# Patient Record
Sex: Male | Born: 1935 | Race: Black or African American | Hispanic: No | Marital: Married | Smoking: Current some day smoker
Health system: Southern US, Community
[De-identification: ages and names within clinical notes are randomized; demographics above are authoritative.]

## PROBLEM LIST (undated history)

## (undated) DIAGNOSIS — D179 Benign lipomatous neoplasm, unspecified: Secondary | ICD-10-CM

## (undated) DIAGNOSIS — H9319 Tinnitus, unspecified ear: Secondary | ICD-10-CM

## (undated) DIAGNOSIS — K649 Unspecified hemorrhoids: Secondary | ICD-10-CM

## (undated) DIAGNOSIS — K2270 Barrett's esophagus without dysplasia: Secondary | ICD-10-CM

## (undated) DIAGNOSIS — B360 Pityriasis versicolor: Secondary | ICD-10-CM

## (undated) DIAGNOSIS — N400 Enlarged prostate without lower urinary tract symptoms: Secondary | ICD-10-CM

## (undated) DIAGNOSIS — Z87898 Personal history of other specified conditions: Secondary | ICD-10-CM

## (undated) DIAGNOSIS — M1990 Unspecified osteoarthritis, unspecified site: Secondary | ICD-10-CM

## (undated) DIAGNOSIS — E785 Hyperlipidemia, unspecified: Secondary | ICD-10-CM

## (undated) DIAGNOSIS — K219 Gastro-esophageal reflux disease without esophagitis: Secondary | ICD-10-CM

## (undated) DIAGNOSIS — M109 Gout, unspecified: Secondary | ICD-10-CM

## (undated) DIAGNOSIS — E669 Obesity, unspecified: Secondary | ICD-10-CM

## (undated) DIAGNOSIS — IMO0002 Reserved for concepts with insufficient information to code with codable children: Secondary | ICD-10-CM

## (undated) DIAGNOSIS — K56609 Unspecified intestinal obstruction, unspecified as to partial versus complete obstruction: Secondary | ICD-10-CM

## (undated) DIAGNOSIS — K635 Polyp of colon: Secondary | ICD-10-CM

## (undated) DIAGNOSIS — C159 Malignant neoplasm of esophagus, unspecified: Secondary | ICD-10-CM

## (undated) DIAGNOSIS — N529 Male erectile dysfunction, unspecified: Secondary | ICD-10-CM

## (undated) DIAGNOSIS — I10 Essential (primary) hypertension: Secondary | ICD-10-CM

## (undated) DIAGNOSIS — I440 Atrioventricular block, first degree: Secondary | ICD-10-CM

## (undated) HISTORY — PX: VASECTOMY: SHX75

## (undated) HISTORY — DX: Personal history of other specified conditions: Z87.898

## (undated) HISTORY — DX: Obesity, unspecified: E66.9

## (undated) HISTORY — DX: Unspecified osteoarthritis, unspecified site: M19.90

## (undated) HISTORY — DX: Pityriasis versicolor: B36.0

## (undated) HISTORY — DX: Benign prostatic hyperplasia without lower urinary tract symptoms: N40.0

## (undated) HISTORY — DX: Gout, unspecified: M10.9

## (undated) HISTORY — DX: Reserved for concepts with insufficient information to code with codable children: IMO0002

## (undated) HISTORY — DX: Malignant neoplasm of esophagus, unspecified: C15.9

## (undated) HISTORY — DX: Male erectile dysfunction, unspecified: N52.9

## (undated) HISTORY — DX: Unspecified hemorrhoids: K64.9

## (undated) HISTORY — PX: HEMORRHOID SURGERY: SHX153

## (undated) HISTORY — DX: Hyperlipidemia, unspecified: E78.5

## (undated) HISTORY — PX: GASTRIC RESECTION: SHX5248

## (undated) HISTORY — DX: Benign lipomatous neoplasm, unspecified: D17.9

## (undated) HISTORY — PX: OTHER SURGICAL HISTORY: SHX169

## (undated) HISTORY — DX: Barrett's esophagus without dysplasia: K22.70

## (undated) HISTORY — DX: Polyp of colon: K63.5

## (undated) HISTORY — DX: Atrioventricular block, first degree: I44.0

## (undated) HISTORY — DX: Tinnitus, unspecified ear: H93.19

## (undated) HISTORY — DX: Unspecified intestinal obstruction, unspecified as to partial versus complete obstruction: K56.609

---

## 1997-12-02 ENCOUNTER — Ambulatory Visit (HOSPITAL_COMMUNITY): Admission: RE | Admit: 1997-12-02 | Discharge: 1997-12-02 | Payer: Self-pay | Admitting: *Deleted

## 1998-01-30 ENCOUNTER — Ambulatory Visit (HOSPITAL_COMMUNITY): Admission: RE | Admit: 1998-01-30 | Discharge: 1998-01-30 | Payer: Self-pay | Admitting: *Deleted

## 1998-04-07 ENCOUNTER — Ambulatory Visit (HOSPITAL_COMMUNITY): Admission: RE | Admit: 1998-04-07 | Discharge: 1998-04-07 | Payer: Self-pay | Admitting: *Deleted

## 1998-06-15 ENCOUNTER — Emergency Department (HOSPITAL_COMMUNITY): Admission: EM | Admit: 1998-06-15 | Discharge: 1998-06-15 | Payer: Self-pay | Admitting: Emergency Medicine

## 1999-05-10 DIAGNOSIS — C159 Malignant neoplasm of esophagus, unspecified: Secondary | ICD-10-CM

## 1999-05-10 HISTORY — DX: Malignant neoplasm of esophagus, unspecified: C15.9

## 1999-05-10 HISTORY — PX: ESOPHAGECTOMY: SUR457

## 1999-06-17 ENCOUNTER — Emergency Department (HOSPITAL_COMMUNITY): Admission: EM | Admit: 1999-06-17 | Discharge: 1999-06-17 | Payer: Self-pay | Admitting: Emergency Medicine

## 1999-06-24 ENCOUNTER — Encounter (INDEPENDENT_AMBULATORY_CARE_PROVIDER_SITE_OTHER): Payer: Self-pay | Admitting: Specialist

## 1999-06-24 ENCOUNTER — Ambulatory Visit (HOSPITAL_COMMUNITY): Admission: RE | Admit: 1999-06-24 | Discharge: 1999-06-24 | Payer: Self-pay | Admitting: *Deleted

## 1999-06-24 ENCOUNTER — Encounter (INDEPENDENT_AMBULATORY_CARE_PROVIDER_SITE_OTHER): Payer: Self-pay | Admitting: *Deleted

## 1999-07-18 DIAGNOSIS — Z8501 Personal history of malignant neoplasm of esophagus: Secondary | ICD-10-CM | POA: Insufficient documentation

## 1999-07-20 ENCOUNTER — Ambulatory Visit (HOSPITAL_COMMUNITY): Admission: RE | Admit: 1999-07-20 | Discharge: 1999-07-20 | Payer: Self-pay | Admitting: *Deleted

## 1999-07-20 ENCOUNTER — Encounter (INDEPENDENT_AMBULATORY_CARE_PROVIDER_SITE_OTHER): Payer: Self-pay | Admitting: *Deleted

## 1999-10-21 ENCOUNTER — Encounter (INDEPENDENT_AMBULATORY_CARE_PROVIDER_SITE_OTHER): Payer: Self-pay | Admitting: *Deleted

## 1999-10-21 ENCOUNTER — Ambulatory Visit (HOSPITAL_COMMUNITY): Admission: RE | Admit: 1999-10-21 | Discharge: 1999-10-21 | Payer: Self-pay | Admitting: *Deleted

## 2001-02-24 ENCOUNTER — Encounter: Payer: Self-pay | Admitting: Emergency Medicine

## 2001-02-24 ENCOUNTER — Emergency Department (HOSPITAL_COMMUNITY): Admission: EM | Admit: 2001-02-24 | Discharge: 2001-02-24 | Payer: Self-pay | Admitting: Emergency Medicine

## 2003-01-06 ENCOUNTER — Ambulatory Visit (HOSPITAL_COMMUNITY): Admission: RE | Admit: 2003-01-06 | Discharge: 2003-01-06 | Payer: Self-pay | Admitting: *Deleted

## 2003-01-06 ENCOUNTER — Encounter (INDEPENDENT_AMBULATORY_CARE_PROVIDER_SITE_OTHER): Payer: Self-pay | Admitting: *Deleted

## 2003-01-06 ENCOUNTER — Encounter: Payer: Self-pay | Admitting: *Deleted

## 2003-05-12 ENCOUNTER — Ambulatory Visit (HOSPITAL_COMMUNITY): Admission: RE | Admit: 2003-05-12 | Discharge: 2003-05-12 | Payer: Self-pay | Admitting: *Deleted

## 2003-05-12 ENCOUNTER — Encounter (INDEPENDENT_AMBULATORY_CARE_PROVIDER_SITE_OTHER): Payer: Self-pay | Admitting: *Deleted

## 2003-05-12 ENCOUNTER — Encounter (INDEPENDENT_AMBULATORY_CARE_PROVIDER_SITE_OTHER): Payer: Self-pay | Admitting: Specialist

## 2004-05-30 ENCOUNTER — Emergency Department (HOSPITAL_COMMUNITY): Admission: EM | Admit: 2004-05-30 | Discharge: 2004-05-30 | Payer: Self-pay | Admitting: Emergency Medicine

## 2004-10-22 ENCOUNTER — Ambulatory Visit (HOSPITAL_COMMUNITY): Admission: RE | Admit: 2004-10-22 | Discharge: 2004-10-22 | Payer: Self-pay | Admitting: Internal Medicine

## 2004-12-10 ENCOUNTER — Encounter (INDEPENDENT_AMBULATORY_CARE_PROVIDER_SITE_OTHER): Payer: Self-pay | Admitting: *Deleted

## 2004-12-10 ENCOUNTER — Ambulatory Visit (HOSPITAL_COMMUNITY): Admission: RE | Admit: 2004-12-10 | Discharge: 2004-12-10 | Payer: Self-pay | Admitting: *Deleted

## 2004-12-10 ENCOUNTER — Encounter (INDEPENDENT_AMBULATORY_CARE_PROVIDER_SITE_OTHER): Payer: Self-pay | Admitting: Specialist

## 2005-03-08 ENCOUNTER — Encounter: Admission: RE | Admit: 2005-03-08 | Discharge: 2005-03-08 | Payer: Self-pay | Admitting: Internal Medicine

## 2005-05-02 ENCOUNTER — Emergency Department (HOSPITAL_COMMUNITY): Admission: EM | Admit: 2005-05-02 | Discharge: 2005-05-02 | Payer: Self-pay | Admitting: Emergency Medicine

## 2006-11-16 ENCOUNTER — Emergency Department (HOSPITAL_COMMUNITY): Admission: EM | Admit: 2006-11-16 | Discharge: 2006-11-16 | Payer: Self-pay | Admitting: Emergency Medicine

## 2007-02-09 ENCOUNTER — Emergency Department (HOSPITAL_COMMUNITY): Admission: EM | Admit: 2007-02-09 | Discharge: 2007-02-09 | Payer: Self-pay | Admitting: Emergency Medicine

## 2007-05-24 ENCOUNTER — Emergency Department (HOSPITAL_COMMUNITY): Admission: EM | Admit: 2007-05-24 | Discharge: 2007-05-24 | Payer: Self-pay | Admitting: Family Medicine

## 2007-07-27 ENCOUNTER — Emergency Department (HOSPITAL_COMMUNITY): Admission: EM | Admit: 2007-07-27 | Discharge: 2007-07-28 | Payer: Self-pay | Admitting: Emergency Medicine

## 2007-10-02 ENCOUNTER — Ambulatory Visit (HOSPITAL_COMMUNITY): Admission: RE | Admit: 2007-10-02 | Discharge: 2007-10-02 | Payer: Self-pay | Admitting: *Deleted

## 2007-10-02 ENCOUNTER — Encounter (INDEPENDENT_AMBULATORY_CARE_PROVIDER_SITE_OTHER): Payer: Self-pay | Admitting: *Deleted

## 2008-07-02 ENCOUNTER — Encounter (INDEPENDENT_AMBULATORY_CARE_PROVIDER_SITE_OTHER): Payer: Self-pay | Admitting: *Deleted

## 2008-07-02 ENCOUNTER — Ambulatory Visit (HOSPITAL_COMMUNITY): Admission: RE | Admit: 2008-07-02 | Discharge: 2008-07-02 | Payer: Self-pay | Admitting: *Deleted

## 2009-05-09 HISTORY — PX: TRANSTHORACIC ECHOCARDIOGRAM: SHX275

## 2009-05-09 HISTORY — PX: NM MYOCAR PERF WALL MOTION: HXRAD629

## 2009-06-16 ENCOUNTER — Encounter (INDEPENDENT_AMBULATORY_CARE_PROVIDER_SITE_OTHER): Payer: Self-pay | Admitting: *Deleted

## 2009-07-13 ENCOUNTER — Ambulatory Visit: Payer: Self-pay | Admitting: Internal Medicine

## 2009-07-13 DIAGNOSIS — K2270 Barrett's esophagus without dysplasia: Secondary | ICD-10-CM | POA: Insufficient documentation

## 2009-07-13 DIAGNOSIS — R143 Flatulence: Secondary | ICD-10-CM

## 2009-07-13 DIAGNOSIS — R142 Eructation: Secondary | ICD-10-CM

## 2009-07-13 DIAGNOSIS — K219 Gastro-esophageal reflux disease without esophagitis: Secondary | ICD-10-CM

## 2009-07-13 DIAGNOSIS — R141 Gas pain: Secondary | ICD-10-CM

## 2009-07-13 DIAGNOSIS — R1319 Other dysphagia: Secondary | ICD-10-CM

## 2009-07-17 DIAGNOSIS — Z8501 Personal history of malignant neoplasm of esophagus: Secondary | ICD-10-CM

## 2010-03-08 ENCOUNTER — Encounter: Payer: Self-pay | Admitting: Internal Medicine

## 2010-03-26 ENCOUNTER — Encounter: Payer: Self-pay | Admitting: Internal Medicine

## 2010-05-29 ENCOUNTER — Encounter: Payer: Self-pay | Admitting: Internal Medicine

## 2010-06-10 NOTE — Op Note (Signed)
Summary: EGD w/Biopsy  NAME:  Oscar [name], Oscar [name]               ACCOUNT NO.:  [id number]      MEDICAL RECORD NO.:  [id number]          PATIENT TYPE:  AMB      LOCATION:  ENDO                         FACILITY:  [facility]      PHYSICIAN:  Georgiana Spinner, M.D.    DATE OF BIRTH:  Dec 25, 1932      DATE OF PROCEDURE:  10/02/2007   DATE OF DISCHARGE:                                  OPERATIVE REPORT      PROCEDURE:  Upper endoscopy with biopsy.      ENDOSCOPIST:  Georgiana Spinner, M.D.      INDICATIONS:  Abdominal pain, now somewhat resolved.      ANESTHESIA:  Fentanyl 75 mcg, Versed 7 mg.      PROCEDURE:  With the patient mildly sedated in the left lateral   decubitus position, the Pentax videoscopic endoscope was inserted in the   mouth and passed under direct vision through the esophagus, which   appeared normal, until we reached the distal esophagus and there was   clear-cut Barrett's esophagus, photographed and multiple biopsies taken.   The suture material was seen and we were able to enter into the stomach   and through a hiatal hernia sac and it was noted that the patient had   gastroparesis and that there was food material seen in the stomach.  We   were able to advance past this after insufflation of the stomach and   enter into the small intestine, which appeared normal.  From this point,   the endoscope was slowly withdrawn taking circumferential views of the   duodenal mucosa until the endoscope been pulled back into the stomach   and withdrawn, taking circumferential views of the gastric and   esophageal mucosa.  The patient's vital signs and pulse oximeter   remained stable.  The patient tolerated the procedure well and without   apparent complications.      FINDINGS:  Residual hiatal hernia with Barrett's esophagus and findings   compatible with gastroparesis with patent gastroduodenal area.      PLAN:  Await biopsy report and discuss the patient's clinical history   with him,  consideration for metoclopramide depending on his needs.  The   patient will call me for results and follow up with me as an outpatient.                  ______________________________   Georgiana Spinner, M.D.            GMO/MEDQ  D:  10/02/2007  T:  10/02/2007  Job:  [id number]

## 2010-06-10 NOTE — Op Note (Signed)
Summary: EGD-Dr. Flora Lipps [facility]. [facility]  Patient:    Oscar [name], Oscar [name]                      MRN: [id number] Proc. Date: 06/24/99 Adm. Date:  [id number] Attending:  Sharyn Dross CC:         Soyla Murphy. Renne Crigler, M.D.                           Procedure Report  PREOPERATIVE DIAGNOSIS:  Melanotic stools.  POSTOPERATIVE DIAGNOSIS:  Short Barretts esophagus, otherwise normal endoscopic  examination.  PROCEDURE PERFORMED:  Esophagogastroduodenoscopy with biopsies.  MEDICATIONS USED:  Demerol 60 mg IV, Versed 6 mg IV over a 10 to 15-minute period of time.  INSTRUMENT USED:  Olympus video panendoscope.  ENDOSCOPIST:  Dortha Kern, M.D.  INDICATIONS:  This pleasant 77 year old black gentleman known to me in the past  with a history of acid peptic disease that was noted.  It has been relatively stable up until recently.  The patient states that he has been feeling slightly  weak and dizzy at that time.  He started passing melanotic stools that was present. The patient presented to the emergency room whereupon evaluation, his hemoglobin appeared to be within normal limits at that time.  The patient subsequently came to the office and was evaluated for the discomforts.  There was no history of any syncopal episodes associated with this or any history of diaphoresis that was present.  The patient was not taking any medications such as iron or Pepto-Bismol that was noted.  OBJECTIVE FINDINGS:  He is a pleasant gentleman who appears to be in no distress. His vital signs are stable.  The HEENT examination was anicteric.  Neck was supple. Lungs were clear.  Heart had a regular rate and rhythm without heaves, thrills,  murmurs or gallop.  The abdomen was soft.  There was mild tenderness to palpation in the superumbilical region with no rebound or referred tenderness appreciated. No hepatosplenomegaly noted.  Digital rectal exam was deferred at that  time. The extremities showed no cyanosis, clubbing or edema.  PLAN:  To proceed with the endoscopic examination based on the character of the  melena that was present and the symptoms, depending on the results to determine the course of therapy.  INFORMED CONSENT:  The patient was advised of the procedure, the indications and the risks involved.  The patient has agreed to have the procedure performed.  A  video was reviewed and the consent form was obtained.  PREOPERATIVE PREPARATION:  The patient was brought to the endoscopy unit where n IV for IV sedating medication was started.  A monitor was placed on the patient to monitor the patients vital signs and oxygen saturation.  Nasal oxygen at two liters a minute was used and after adequate sedation was performed, the procedure was begun.  DESCRIPTION OF PROCEDURE:  The instrument was advanced with the patient lying in the left lateral position via direct technique without difficulty.  The oropharyngeal epliglottis, vocal cords and piriform sinuses appeared to be grossly within normal limits.  The esophagus was normal without any evidence of acute inflammation, ulcerations, hiatal hernias or varices appreciated.  The Z-line appeared to be approximately 40 cm that was noted.  The gastric area showed a normal mucous lake without  any evidence of acute inflammation or ulcerations that was appreciated at this time.  The pylorus was  normal with good peristaltic activity.  Upon advancement through the pyloric canal, the duodenal bulb and second portion appeared to be grossly within normal limits. The instrument was retracted back where a retroflex view of the cardia showed evidence of relaxed lower esophageal sphincter that was appreciated. Photographs were taken of the relaxation that was noted at this time.  It appeared to be moderate in character at this time.  The instrument was retracted back where there appeared to be  evidence of a short  Barretts that was appreciated that was noted. Photographs were taken of the region as the instrument was retracted back at this time.  Biopsies of the Barretts area was noted at this time.  There was no other abnormalities appreciated.  No evidence of any postendoscopic trauma to the mucosal tissue that was appreciated.  The instrument was subsequently removed per orum without difficulty with the patient tolerating the procedure well.  TREATMENT: 1. I am going to recommend treating the patient with a proton pump inhibitor    medication at this time. 2. Would recommend repeating the procedure in approximately six months to one    years period of time.  The patient may need to have periodic following with    this Barretts process to prevent complication noted.  The data has been shown in the past that with short Barretts, treatment with a PI may be beneficial for the patient at this time to possibly delay or prevent neoplastic process that could develop in this patient.  This is not the case with large Barretts, long Barretts that was appreciated but it can be beneficial with the patient at this time.  Thus, I would recommend longterm PPIs for the patient during the interim.  Will await the results of the path report depending on the results to determine the course of therapy. DD:  06/24/99 TD:  06/24/99 Job: [id number]

## 2010-06-10 NOTE — Op Note (Signed)
Summary: Colonoscopy-Dr. Virginia Rochester  NAME:  Oscar [name], Oscar [name]                         ACCOUNT NO.:  [id number]   MEDICAL RECORD NO.:  [id number]                   PATIENT TYPE:  AMB   LOCATION:  ENDO                                 FACILITY:  [facility]   PHYSICIAN:  Georgiana Spinner, M.D.                 DATE OF BIRTH:  1932-08-04   DATE OF PROCEDURE:  DATE OF DISCHARGE:                                 OPERATIVE REPORT   PROCEDURE:  Colonoscopy.   INDICATIONS:  Colon cancer screening.   ANESTHESIA:  Demerol 40 mg, Versed 2.   DESCRIPTION OF PROCEDURE:  With the patient mildly sedated and in the left  lateral decubitus position, the Olympus videoscopic colonoscope was inserted  in the rectum and passed under direct vision to the cecum, identified by  ileocecal valve and appendiceal orifice.  The prep was suboptimal.  The  patient had indicated that the prep might be suboptimal because he was not  sure he really wanted to proceed with the exam, but eventually did consent.  From this point, the colonoscope was slowly withdrawn, taking  circumferential views of the colonic mucosa, stopping only in the rectum  which appeared normal on direct and showed hemorrhoids on retroflexed view.  The endoscope was straightened and withdrawn.  The patient's vital signs and  pulse oximetry remained stable.  The patient tolerated the procedure well  without apparent complications.   FINDINGS:  Internal hemorrhoids; otherwise unremarkable colonoscopic  examination.   PLAN:  Consider repeat examination possibly in 5-10 years.                                               Georgiana Spinner, M.D.    GMO/MEDQ  D:  05/12/2003  T:  05/12/2003  Job:  [id number]

## 2010-06-10 NOTE — Op Note (Signed)
Summary: Colonoscopy-Dr. Flora Lipps H. [facility]  Patient:    Oscar [name], Oscar [name]                      MRN: [id number] Proc. Date: 07/20/99 Adm. Date:  [id number] Attending:  Sharyn Dross CC:         Soyla Murphy. Renne Crigler, M.D.                           Procedure Report  REFERRING PHYSICIAN:  Soyla Murphy. Renne Crigler, M.D.  PREPROCEDURE DIAGNOSIS:  History of colon polyps.  POSTPROCEDURE DIAGNOSIS:  Scattered diverticula, otherwise normal colonoscopic examination to the cecum.  PROCEDURE:  Colonoscopy.  MEDICATIONS:  Demerol 80 mg IV, Versed 8 mg IV over a 10 minute period of time.   INSTRUMENT:  Olympus video colonoscopy.  ENDOSCOPIST:  Sharyn Dross., M.D.  INDICATIONS:  This pleasant 77 year old black gentleman, who is known to me with a history of colon polyps in the past is brought back in for reevaluation of the area.  The patient denies any symptoms of any major GI discomforts from the lower standpoint.  There is no evidence of any bleeding nor any evidence of any weight loss and discomfort noted.  OBJECTIVE FINDINGS:  He is a pleasant gentleman and appears to be in no acute distress.  His vital signs are stable.  His HEENT examination is  anicteric. Neck was supple.  Lungs are clear.  Heart had a regular rate and rhythm without heaves, thrills, murmurs, or gallops.  The abdomen is soft.  No tenderness.  No hepatosplenomegaly.  The extremities are within normal limits.  PLAN:  I am going to proceed with the colonoscopic examination.  INFORMED CONSENT:  The patient was advised of the procedure, and the indications of the risks involved.  The patient has agreed to have the procedure performed. Video was reviewed and consent form obtained.  PREOPERATIVE PREPARATION:  The patient was brought to the endoscopy unit with an IV for IV.  Sedating medication was started.  A monitor was placed on the patient o monitor the patients vital signs and  oxygen saturation.  Nasal oxygen at 2 L/min. was used and after adequate sedation was performed the procedure was begun.  FINAL PREP:  The patient was given GoLYTELY and Reglan as a bowel prep.  The patient tolerated the prep well without any complications.  The GoLYTELY prep was excellent.  DESCRIPTION OF PROCEDURE:  The instrument was advanced with the patient lying in the left lateral position via the direct technique to approximately 100 cm approximately in the colon to the cecal region.  This was confirmed by palpation and visualization of the ileocecal valve and the appendiceal orifice.  There appeared to be no gross abnormalities such as masses, polyps, or stricture lesion appreciated.  The vascular pattern appeared to be well within normal limits throughout the entire colon.  The mucosal pattern showed scattered diverticula hat was present, but no evidence of any inflammatory changes noted at this time. There was no increased tortuosity of the colon and no evidence of any internal nor external hemorrhoids upon exiting from the area.  The patient tolerated the procedure well without any complications.  TREATMENT: 1. Conservative management. 2. Would recommend repeating procedure in approximately two years.  If    negative then  would proceed with a standard routine of every three    years from that point. DD:  07/20/99 TD:  07/20/99 Job: [id number]

## 2010-06-10 NOTE — Assessment & Plan Note (Signed)
Summary: GERD symptoms..Barrett's (newpt)   History of Present Illness Visit Type: Initial Consult Primary GI MD: Yancey Flemings MD Primary Provider: Merri Brunette, MD Requesting Provider: Merri Brunette, MD Chief Complaint: GERD / Esophageal cancer. Dr. Renne Crigler referred him to follow up for his Barretts. History of Present Illness:   77 y.o. AA male w/ h/o hypertension,hyperlipidemia, osteoarthritis, diabetes mellitus (diet controlled), and reported esophageal cancer - requiring two surgies in 2000 and 2001 at [facility] (per patient - NO DETAILS).He is sent today regarding GERD, a history of Barrett's, and the need for upper endoscopy. He has a long GI history and I have reviewed over 30 pages of reports from various sources including Dr. Virginia Rochester (his most recent GI doc), Dr Tad Moore (former GI doc), and Dr Renne Crigler. Also, reviewed multiple EGD,Colonoscopy, Pathology, and CT reports. In sum, EGD w/ Barrett's and HGD in 2001. Multiple other EGDs stating Barrett's but not confirmed by bx. Last EGD2-24-10 w/ rare focus of Barrett's. Last colonoscopy 07-02-08 normal. Current complaints include reflux symptoms (pyrosis) when not taking PPI. Certain food make it worse. Takes omeprazole daily which helps. Some mild solid food dysphagia  for several years and bloating more chronically. Occ alt bowel habits related to what he eats. No bleeding or weight loss. His multiple chronic medical problems are stable.   GI Review of Systems    Reports acid reflux, bloating, dysphagia with solids, and  heartburn.      Denies abdominal pain, belching, chest pain, dysphagia with liquids, loss of appetite, nausea, vomiting, vomiting blood, weight loss, and  weight gain.      Reports constipation, diarrhea, and  hemorrhoids.     Denies anal fissure, black tarry stools, change in bowel habit, diverticulosis, fecal incontinence, heme positive stool, irritable bowel syndrome, jaundice, light color stool, liver problems, rectal  bleeding, and  rectal pain. Preventive Screening-Counseling & Management  Alcohol-Tobacco     Smoking Status: current      Drug Use:  no.      Current Medications (verified): 1)  Omeprazole 20 Mg Cpdr (Omeprazole) .... Take Two By Mouth At Bedtime 2)  Losartan Potassium-Hctz 50-12.5 Mg Tabs (Losartan Potassium-Hctz) .... Take One By Mouth Once Daily 3)  Saw Palmetto 1000 Mg Caps (Saw Palmetto (Serenoa Repens)) .... Take Two Tabs By Mouth Once Daily 4)  B Complex-B12  Tabs (B Complex Vitamins) .... Take One By Mouth Once Daily 5)  Ginkoba 40 Mg Tabs (Ginkgo Biloba) .... Take Two Tabs By Mouth Once Daily 6)  Multivitamins  Tabs (Multiple Vitamin) .... Take One By Mouth Once Daily  Allergies (verified): No Known Drug Allergies  Past History:  Past Medical History: Barretts Esophagus Hemorrhoids Hypertension Hx. of Esophageal Cancer Gout Hyperlipidemia Pneumonia HX Colon polyps Diabetes (diet controlled)  Past Surgical History: Hemorrhoidectomy Esophgeal surgery 2001 & 2002 Vasectomy Bilateral olecranon bursal excision  Family History: Family History of Diabetes: mother Family History of Kidney Disease: son  Social History: Occupation: retired married two sons, two daughters Patient currently smokes. pipe 2-3 times per day Alcohol Use - yes 2 per day Daily Caffeine Use one per day Illicit Drug Use - no Smoking Status:  current Drug Use:  no  Review of Systems       The patient complains of headaches-new.  The patient denies allergy/sinus, anemia, anxiety-new, arthritis/joint pain, back pain, blood in urine, breast changes/lumps, change in vision, confusion, cough, coughing up blood, depression-new, fainting, fatigue, fever, hearing problems, heart murmur, heart rhythm changes, itching, menstrual  pain, muscle pains/cramps, night sweats, nosebleeds, pregnancy symptoms, shortness of breath, skin rash, sleeping problems, sore throat, swelling of feet/legs, swollen lymph  glands, thirst - excessive , urination - excessive , urination changes/pain, urine leakage, vision changes, and voice change.    Vital Signs:  Patient profile:   77 year old male Height:      74.5 inches Weight:      223.6 pounds BMI:     28.43 Pulse rate:   72 / minute Pulse rhythm:   regular BP sitting:   136 / 70  (right arm) Cuff size:   regular  Vitals Entered By: Harlow Mares CMA Duncan Dull) (July 13, 2009 10:19 AM)  Physical Exam  General:  Well developed, well nourished, no acute distress. Head:  Normocephalic and atraumatic. Eyes:  PERRLA, no icterus. Nose:  No deformity, discharge,  or lesions. Mouth:  No deformity or lesions,  Neck:  Supple; no masses or thyromegaly. Lungs:  Clear throughout to auscultation. Heart:  Regular rate and rhythm; no murmurs, rubs,  or bruits. Abdomen:  Soft, nontender and nondistended. No masses, hepatosplenomegaly or hernias noted. Normal bowel sounds.Prior surgical incisions well healed. Msk:  Symmetrical with no gross deformities. Normal posture. Pulses:  Normal pulses noted. Extremities:  No clubbing, cyanosis, edema or deformities noted. Neurologic:  Alert and  oriented x4;  grossly normal neurologically. Skin:  Intact without significant lesions or rashes. Psych:  Alert and cooperative. Normal mood and affect.   Impression & Recommendations:  Problem # 1:  GERD (ICD-530.81) complicated by Barrett's, esophageal cancer. Needs PPI to controll reflux symptoms.  PLAN 1. Continue Omeprazole daily (he gets this through the [facility] hospital) 2. Reflux precautions  Problem # 2:  BARRETT'S ESOPHAGUS (ICD-530.85) Undergoes regular surveillance endoscopy, generally annually. Currently due.  PLAN 1. Upper endoscopy. The nature of the procedure, risks,benefits, and alternatives reviewed. He understands and agrees to proceed.Does not want to set up now. He will call the office in the near future to arrange the exam.  Problem # 3:  DYSPHAGIA  (ICD-787.29) some intermittent dysphagia. Rule out stricture.  PLAN 1. Evaluate at time of endoscopy  Problem # 4:  PERSONAL HISTORY MALIGNANT NEOPLASM ESOPHAGUS (ICD-V10.03) Need details. He has agreed to obtain records from [facility] for my review.  Problem # 5:  FLATULENCE-GAS-BLOATING (ICD-787.3) Chronic bloating. Likely due to prior surgery and alterred anatomy / motility. Nothing new to offer.  Patient Instructions: 1)  Patient will call to schedule EGD with Dr. Marina Goodell. 2)  Please get records from [place] for Dr. Marina Goodell to review. 3)  The medication list was reviewed and reconciled.  All changed / newly prescribed medications were explained.  A complete medication list was provided to the patient / caregiver. 4)  Instructions printed and given to patient. Milford Cage [facility]  July 13, 2009 10:49 AM 5)  Copy : Dr. Merri Brunette

## 2010-06-10 NOTE — Op Note (Signed)
Summary: EGD-Dr. Virginia Rochester  NAME:  Oscar [name], Oscar [name]                         ACCOUNT NO.:  [id number]   MEDICAL RECORD NO.:  [id number]                   PATIENT TYPE:  AMB   LOCATION:  ENDO                                 FACILITY:  [facility]   PHYSICIAN:  Georgiana Spinner, M.D.                 DATE OF BIRTH:  [birth year]   DATE OF PROCEDURE:  DATE OF DISCHARGE:                                 OPERATIVE REPORT   PROCEDURE:  Upper endoscopy.   INDICATIONS:  Status post esophageal resection for cancer with anastomosis.   ANESTHESIA:  Demerol 60 mg, Versed 6 mg.   DESCRIPTION OF PROCEDURE:  With the patient mildly sedated in the left  lateral decubitus position, the Olympus videoscopic endoscope was inserted  in the mouth and passed under direct vision through the esophagus, which  appeared normal except where attached to the stomach and it did appear to be  somewhat inflamed in this area.  Therefore, this was photographed and  biopsied.  We entered into the stomach.  The  fundus, body, antrum, duodenal  bulb, and second portion of the duodenum appeared normal.  From this point,  the endoscope was slowly withdrawn taking circumferential views of the  duodenal mucosa until the endoscope then pulled back into the stomach,  placed in retroflexion and viewed the stomach from below and a large hiatal  hernia was seen and photographed.  There was evidence of ______________ with  the GE junction around the endoscope.  The endoscope was then straightened,  withdrawn, taking circumferential views of the remaining gastric and  esophageal mucosa.  The patient's vital signs and pulse oximetry remained  stable.  The patient tolerated the procedure well without apparent  complication.   FINDINGS:  Changes of mild, possibly esophagitis at the anastomotic site,  photographed and biopsied.  Await biopsy report.  The patient will call me  for results and follow up with me as an outpatient.  Proceed to  colonoscopy.                                               Georgiana Spinner, M.D.    GMO/MEDQ  D:  05/12/2003  T:  05/12/2003  Job:  [id number]

## 2010-06-10 NOTE — Op Note (Signed)
Summary: EGD-Dr. Virginia Rochester  NAMEAABAN, GRIEP               ACCOUNT NO.:  [id number]      MEDICAL RECORD NO.:  [id number]          PATIENT TYPE:  AMB      LOCATION:  ENDO                         FACILITY:  [facility]      PHYSICIAN:  Georgiana Spinner, M.D.    DATE OF BIRTH:  1933/01/16      DATE OF PROCEDURE:   DATE OF DISCHARGE:                                  OPERATIVE REPORT      PROCEDURE:  Upper endoscopy.      INDICATIONS:  Abdominal discomfort, weight loss.      ANESTHESIA:  Fentanyl 75 mcg, Versed 7.5 mg.      PROCEDURE:  With the patient mildly sedated in the left lateral   decubitus position, the Pentax videoscopic endoscope was inserted in the   mouth and passed under direct vision through the esophagus which   appeared normal until we reached the distal esophagus and there were   changes of Barrett's photographed and subsequently biopsied.  We entered   into the stomach which appeared to be somewhat edematous, but fundus,   body, antrum, duodenal bulb and second portion of the duodenum were   visualized.  Of note, it was difficult to get the endoscope through the   edematous pyloric area, but we were able to do that eventually.  Once   accomplished, the endoscope was withdrawn taking circumferential views   of the duodenal mucosa until the endoscope had been pulled back in the   stomach and placed in retroflexion to view the stomach from below.  The   endoscope was then straightened and withdrawn taking circumferential   views of remaining gastric and esophageal mucosa.  The patient's vital   signs and pulse oximeter remained stable.  The patient tolerated the   procedure well without apparent complications.      FINDINGS:  Barrett's esophagus, edematous stomach.      PLAN:  Proceed to colonoscopy.                  ______________________________   Georgiana Spinner, M.D.            GMO/MEDQ  D:  07/02/2008  T:  07/02/2008  Job:  [id number]

## 2010-06-10 NOTE — Op Note (Signed)
Summary: EGD-Dr. Virginia Rochester  Oscar [name], SCHONBERG               ACCOUNT NO.:  [id number]   MEDICAL RECORD NO.:  [id number]          PATIENT TYPE:  AMB   LOCATION:  ENDO                         FACILITY:  [facility]   PHYSICIAN:  Georgiana Spinner, M.D.    DATE OF BIRTH:  08/14/1932   DATE OF PROCEDURE:  12/10/2004  DATE OF DISCHARGE:                                 OPERATIVE REPORT   PROCEDURE:  Upper endoscopy.   INDICATIONS:  Abdominal pain.   ANESTHESIA:  Demerol 50, Versed 7 mg.   PROCEDURE:  With the patient mildly sedated in the left lateral decubitus  position, the Olympus videoscopic endoscope was inserted in the mouth,  passed under direct vision through the esophagus which appeared normal to me  at first blush.  There was no evidence of Barrett's.  We entered into the  stomach.  Fundus, body, antrum, duodenal bulb was then entered as was second  portion of duodenum.  From this point, the endoscope was slowly withdrawn  taking circumferential views of duodenal mucosa until the endoscope had been  pulled back into the stomach and placed in retroflexion to view the stomach  from below.  The endoscope was straightened and withdrawn taking  circumferential views of the remaining gastric and esophageal mucosa,  stopping in the distal esophagus where there appeared to be some geographic  distribution of mucosa which I biopsied.  The endoscope was then withdrawn.  The patient's vital signs, pulse oximeter remained stable.  The patient  tolerated the procedure well without apparent complications.   FINDINGS:  Raised mucosa in a geographic pattern in the esophagus of unknown  etiology, biopsied.  Await biopsy report.  The patient will call me for  results and follow-up with me as an outpatient.       GMO/MEDQ  D:  12/10/2004  T:  12/10/2004  Job:  [id number]

## 2010-06-10 NOTE — Letter (Signed)
Summary: New Patient letter  [facility]  [address] [place], [place] [address]   Phone: [phone]  Fax: [phone]       06/16/2009 MRN: [id number]  Oscar [name] [address] [place], [place]  [address]  Dear Mr. Wysocki,  Welcome to the Gastroenterology Division at [facility].    You are scheduled to see Dr.  Marina Goodell on 07/13/2009 at 10:00AM on the 3rd floor at [facility], [address]  We ask that you try to arrive at our office 15 minutes prior to your appointment time to allow for check-in.  We would like you to complete the enclosed self-administered evaluation form prior to your visit and bring it with you on the day of your appointment.  We will review it with you.  Also, please bring a complete list of all your medications or, if you prefer, bring the medication bottles and we will list them.  Please bring your insurance card so that we may make a copy of it.  If your insurance requires a referral to see a specialist, please bring your referral form from your primary care physician.  Co-payments are due at the time of your visit and may be paid by cash, check or credit card.     Your office visit will consist of a consult with your physician (includes a physical exam), any laboratory testing he/she may order, scheduling of any necessary diagnostic testing (e.g. x-ray, ultrasound, CT-scan), and scheduling of a procedure (e.g. Endoscopy, Colonoscopy) if required.  Please allow enough time on your schedule to allow for any/all of these possibilities.    If you cannot keep your appointment, please call [phone] to cancel or reschedule prior to your appointment date.  This allows [place] the opportunity to schedule an appointment for another patient in need of care.  If you do not cancel or reschedule by 5 p.m. the business day prior to your appointment date, you will be charged a $50.00 late cancellation/no-show fee.    Thank you for  choosing [facility] for your medical needs.  We appreciate the opportunity to care for you.  Please visit [place] at our website  to learn more about our practice.                     Sincerely,                                                             The Gastroenterology Division

## 2010-06-10 NOTE — Letter (Signed)
Summary: [facility]   Imported By: Lester [place] 04/22/2010 08:46:13  _____________________________________________________________________  External Attachment:    Type:   Image     Comment:   External Document

## 2010-06-10 NOTE — Op Note (Signed)
Summary: Colonoscopy-Dr. Virginia Rochester  NAME:  Oscar [name], Oscar [name]               ACCOUNT NO.:  [id number]      MEDICAL RECORD NO.:  [id number]          PATIENT TYPE:  AMB      LOCATION:  ENDO                         FACILITY:  [facility]      PHYSICIAN:  Georgiana Spinner, M.D.    DATE OF BIRTH:  Jul 08, 1932      DATE OF PROCEDURE:   DATE OF DISCHARGE:                                  OPERATIVE REPORT      PROCEDURE:  Colonoscopy.      INDICATIONS:  Colon polyps.      ANESTHESIA:  Fentanyl 25 mcg, Versed 2.5 mg.      PROCEDURE:  With the patient mildly sedated in the left lateral   decubitus position, a rectal exam was performed which was unremarkable.   Subsequently, the Pentax videoscopic pediatric colonoscope was inserted   in the rectum and passed under direct vision to the cecum, identified by   the ileocecal valve and appendiceal orifice.  Prep was slightly   suboptimal in that there was thickish material in the cecum that had to   be washed and suctioned.  From this point, the colonoscope was slowly   withdrawn taking circumferential views of colonic mucosa.  Of note, the   entire colon appeared somewhat edematous and would never fully   insufflate until we reached the rectum which appeared normal on direct   and showed hemorrhoids on retroflexed view.  The endoscope was   straightened and withdrawn.  The patient's vital signs and pulse   oximeter remained stable.  The patient tolerated the procedure well   without apparent complication.      FINDINGS:  Edematous colon, but no abnormalities noted in the colon per   say, except internal hemorrhoids.      PLAN:  We will order a CT scan to evaluate the abdomen further and have   the patient follow-up with me for the results of the gastric biopsies   and of the CT scan.                  ______________________________   Georgiana Spinner, M.D.            GMO/MEDQ  D:  07/02/2008  T:  07/02/2008  Job:  [id number]

## 2010-06-10 NOTE — Op Note (Signed)
Summary: EGD-[facility]  Patient:    Oscar [name], Oscar [name]                      MRN: [id number] Proc. Date: 10/21/99 Adm. Date:  [id number] Disc. Date: [id number] Attending:  Sharyn Dross                           Procedure Report  PREOPERATIVE DIAGNOSIS:  History of Barretts esophagus with high-grade dysplasia, brought back in for reevaluation.  POSTOPERATIVE DIAGNOSIS:  Barretts esophagus noted.  PROCEDURE:  Esophagogastroduodenoscopy with biopsies.  MEDICATIONS:  Demerol 50 mg IV, Versed 5 mg IV, over a 10-minute period of time.  INSTRUMENT:  The Olympus video panendoscope.  ENDOSCOPIST:  Sharyn Dross., M.D.  INDICATION:  This pleasant 77 year old gentleman was brought back in three months after his previous endoscopic examination, which showed evidence of Barretts esophagus with a high-grade dysplasia at this time.  The biopsy showed no evidence of malignancy, but since there was high-grade dysplasia and angriness noted, the patient was being brought back in on a quarterly basis for re-endoscopies and biopsies.  He is doing relatively well without any major complaints.  OBJECTIVE FINDINGS:  GENERAL:  This is a pleasant gentleman in no distress.  VITAL SIGNS:  His vital signs were stable.  HEENT:  Anicteric.  NECK:  Supple.  LUNGS:  Clear.  HEART:  Regular rate and rhythm without _____, murmurs, or gallops.  ABDOMEN:  Soft, no tenderness, no hepatosplenomegaly appreciated.  EXTREMITIES:  Within normal limits.  PLAN:  I am going to proceed with the endoscopic examination.  INFORMED CONSENT:  The patient was advised of the procedure, indications and risks involved.  He was explained of the reasons for the repeat serial biopsies that will be done on a quarterly basis based on the results of previous biopsy.  The patient has agreed to come back for a reevaluation.  PREOPERATIVE PREPARATION:  The patient was brought  into the endoscopist with the IV, and IV sedating medications were started.  Monitors were placed on the patient to monitor the patients vital signs and oxygen saturation.  Nasal oxygen at 2 L/min. was used and after adequate sedation was performed, the procedure was begun.  PROCEDURE NOTE:  The instrument was advanced with the patient lying in the left lateral position via the _____ technique without difficulty.  Initially the patient would manipulate the instrument to push it away from going in the right direction.  However, with increased medication the instrument was able to advance into the area without difficulty.  The oropharyngeal, epiglottis, and vocal cords appeared to be unremarkable.  The esophagus shows no evidence of acute inflammation or ulcerations in the proximal portion of the esophagus. However, there appeared to be evidence of hiatal hernia with evidence of the Barretts esophagus appreciated at this time.  The gastric area showed a normal mucus lake without any evidence of acute inflammation or ulcerations that was noted.  The pylorus area appeared to be within normal limits but upon advancing through the pyloric canal, the duodenal bulb showed evidence of duodenitis that was noted in the bulb and postbulbar region.  This appeared to be circumferentially and in the bulb region at this time.  It appeared to be mild to slightly moderate in character at  this time.  There was no ulceration noted.  The ampulla of Vater was visualized without any abnormalities.  The instrument was retracted back at this time.  Retroflexed view of the cardia again showed evidence of the hiatal hernia in the gastric region at this time.  The instrument was retracted back into the esophageal area, where what appears to be the Z-line was approximately 48 cm, but the Barretts protruded up to 38 cm at this time. Photographs were taken of this region at this time.  Multiple biopsies were taken  (some with difficulty).  There was no other gross abnormality noted.  The instrument was gradually retracted back and removed without difficulty, and the patient tolerated the procedure well.  TREATMENT: 1. Will await the results of the biopsy reports at this time. 2. Will have the patient follow up with me in the office. 3. Presently will recommend repeating the procedure in approximately three to    four months. 4. The patient was initially to have a surgical consultation for this region    at this time and depending upon these results will determine the course of    therapy. DD:  10/21/99 TD:  10/25/99 Job: [id number]

## 2010-07-30 ENCOUNTER — Emergency Department (HOSPITAL_COMMUNITY)
Admission: EM | Admit: 2010-07-30 | Discharge: 2010-07-30 | Disposition: A | Discharge status: Home / Self Care | Payer: Medicare Other | Attending: Emergency Medicine | Admitting: Emergency Medicine

## 2010-07-30 ENCOUNTER — Emergency Department (HOSPITAL_COMMUNITY): Payer: Medicare Other

## 2010-07-30 DIAGNOSIS — Z9889 Other specified postprocedural states: Secondary | ICD-10-CM | POA: Insufficient documentation

## 2010-07-30 DIAGNOSIS — R10812 Left upper quadrant abdominal tenderness: Secondary | ICD-10-CM | POA: Insufficient documentation

## 2010-07-30 DIAGNOSIS — Z8501 Personal history of malignant neoplasm of esophagus: Secondary | ICD-10-CM | POA: Insufficient documentation

## 2010-07-30 DIAGNOSIS — I10 Essential (primary) hypertension: Secondary | ICD-10-CM | POA: Insufficient documentation

## 2010-07-30 DIAGNOSIS — M47814 Spondylosis without myelopathy or radiculopathy, thoracic region: Secondary | ICD-10-CM | POA: Insufficient documentation

## 2010-07-30 DIAGNOSIS — R109 Unspecified abdominal pain: Secondary | ICD-10-CM | POA: Insufficient documentation

## 2010-07-30 LAB — DIFFERENTIAL
Basophils Absolute: 0 10*3/uL (ref 0.0–0.1)
Basophils Relative: 0 % (ref 0–1)
Lymphocytes Relative: 26 % (ref 12–46)
Neutro Abs: 5.3 10*3/uL (ref 1.7–7.7)
Neutrophils Relative %: 64 % (ref 43–77)

## 2010-07-30 LAB — POCT I-STAT, CHEM 8
BUN: 11 mg/dL (ref 6–23)
Calcium, Ion: 1.14 mmol/L (ref 1.12–1.32)
Chloride: 100 meq/L (ref 96–112)
Creatinine, Ser: 1.1 mg/dL (ref 0.4–1.5)
Glucose, Bld: 133 mg/dL — ABNORMAL HIGH (ref 70–99)
HCT: 46 % (ref 39.0–52.0)
Hemoglobin: 15.6 g/dL (ref 13.0–17.0)
Potassium: 4 mEq/L (ref 3.5–5.1)
Sodium: 138 mEq/L (ref 135–145)
TCO2: 25 mmol/L (ref 0–100)

## 2010-07-30 LAB — URINALYSIS, ROUTINE W REFLEX MICROSCOPIC
Glucose, UA: NEGATIVE mg/dL
Protein, ur: NEGATIVE mg/dL
Specific Gravity, Urine: 1.015 (ref 1.005–1.030)
pH: 7 (ref 5.0–8.0)

## 2010-07-30 LAB — CBC
Hemoglobin: 14.8 g/dL (ref 13.0–17.0)
RBC: 4.56 MIL/uL (ref 4.22–5.81)

## 2010-07-30 MED ORDER — IOHEXOL 300 MG/ML  SOLN
100.0000 mL | Freq: Once | INTRAMUSCULAR | Status: AC | PRN
  Administered 2010-07-30: 100 mL via INTRAVENOUS

## 2010-08-24 LAB — CREATININE, SERUM
Creatinine, Ser: 1.01 mg/dL (ref 0.4–1.5)
GFR calc Af Amer: >60 mL/min (ref >60)
GFR calc non Af Amer: >60 mL/min (ref >60)

## 2010-08-24 LAB — BUN: BUN: 8 mg/dL (ref 6–23)

## 2010-09-21 NOTE — Op Note (Signed)
[name], Oscar               ACCOUNT NO.:  [id number]   MEDICAL RECORD NO.:  [id number]          PATIENT TYPE:  AMB   LOCATION:  ENDO                         FACILITY:  [facility]   PHYSICIAN:  Georgiana Spinner, M.D.    DATE OF BIRTH:  16-Nov-1932   DATE OF PROCEDURE:  DATE OF DISCHARGE:                               OPERATIVE REPORT   PROCEDURE:  Colonoscopy.   INDICATIONS:  Colon polyps.   ANESTHESIA:  Fentanyl 25 mcg, Versed 2.5 mg.   PROCEDURE:  With the patient mildly sedated in the left lateral  decubitus position, a rectal exam was performed which was unremarkable.  Subsequently, the Pentax videoscopic pediatric colonoscope was inserted  in the rectum and passed under direct vision to the cecum, identified by  the ileocecal valve and appendiceal orifice.  Prep was slightly  suboptimal in that there was thickish material in the cecum that had to  be washed and suctioned.  From this point, the colonoscope was slowly  withdrawn taking circumferential views of colonic mucosa.  Of note, the  entire colon appeared somewhat edematous and would never fully  insufflate until we reached the rectum which appeared normal on direct  and showed hemorrhoids on retroflexed view.  The endoscope was  straightened and withdrawn.  The patient's vital signs and pulse  oximeter remained stable.  The patient tolerated the procedure well  without apparent complication.   FINDINGS:  Edematous colon, but no abnormalities noted in the colon per  say, except internal hemorrhoids.   PLAN:  We will order a CT scan to evaluate the abdomen further and have  the patient follow-up with me for the results of the gastric biopsies  and of the CT scan.           ______________________________  Georgiana Spinner, M.D.     GMO/MEDQ  D:  07/02/2008  T:  07/02/2008  Job:  [id number]

## 2010-09-21 NOTE — Op Note (Signed)
[name], Oscar [name]               ACCOUNT NO.:  [id number]   MEDICAL RECORD NO.:  [id number]          PATIENT TYPE:  AMB   LOCATION:  ENDO                         FACILITY:  [facility]   PHYSICIAN:  Georgiana Spinner, M.D.    DATE OF BIRTH:  1932/06/17   DATE OF PROCEDURE:  DATE OF DISCHARGE:                               OPERATIVE REPORT   PROCEDURE:  Upper endoscopy.   INDICATIONS:  Abdominal discomfort, weight loss.   ANESTHESIA:  Fentanyl 75 mcg, Versed 7.5 mg.   PROCEDURE:  With the patient mildly sedated in the left lateral  decubitus position, the Pentax videoscopic endoscope was inserted in the  mouth and passed under direct vision through the esophagus which  appeared normal until we reached the distal esophagus and there were  changes of Barrett's photographed and subsequently biopsied.  We entered  into the stomach which appeared to be somewhat edematous, but fundus,  body, antrum, duodenal bulb and second portion of the duodenum were  visualized.  Of note, it was difficult to get the endoscope through the  edematous pyloric area, but we were able to do that eventually.  Once  accomplished, the endoscope was withdrawn taking circumferential views  of the duodenal mucosa until the endoscope had been pulled back in the  stomach and placed in retroflexion to view the stomach from below.  The  endoscope was then straightened and withdrawn taking circumferential  views of remaining gastric and esophageal mucosa.  The patient's vital  signs and pulse oximeter remained stable.  The patient tolerated the  procedure well without apparent complications.   FINDINGS:  Barrett's esophagus, edematous stomach.   PLAN:  Proceed to colonoscopy.           ______________________________  Georgiana Spinner, M.D.     GMO/MEDQ  D:  07/02/2008  T:  07/02/2008  Job:  [id number]

## 2010-09-21 NOTE — Op Note (Signed)
[name], Oscar [name]               ACCOUNT NO.:  [id number]   MEDICAL RECORD NO.:  [id number]          PATIENT TYPE:  AMB   LOCATION:  ENDO                         FACILITY:  [facility]   PHYSICIAN:  Georgiana Spinner, M.D.    DATE OF BIRTH:  1932-11-18   DATE OF PROCEDURE:  10/02/2007  DATE OF DISCHARGE:                               OPERATIVE REPORT   PROCEDURE:  Upper endoscopy with biopsy.   ENDOSCOPIST:  Georgiana Spinner, M.D.   INDICATIONS:  Abdominal pain, now somewhat resolved.   ANESTHESIA:  Fentanyl 75 mcg, Versed 7 mg.   PROCEDURE:  With the patient mildly sedated in the left lateral  decubitus position, the Pentax videoscopic endoscope was inserted in the  mouth and passed under direct vision through the esophagus, which  appeared normal, until we reached the distal esophagus and there was  clear-cut Barrett's esophagus, photographed and multiple biopsies taken.  The suture material was seen and we were able to enter into the stomach  and through a hiatal hernia sac and it was noted that the patient had  gastroparesis and that there was food material seen in the stomach.  We  were able to advance past this after insufflation of the stomach and  enter into the small intestine, which appeared normal.  From this point,  the endoscope was slowly withdrawn taking circumferential views of the  duodenal mucosa until the endoscope been pulled back into the stomach  and withdrawn, taking circumferential views of the gastric and  esophageal mucosa.  The patient's vital signs and pulse oximeter  remained stable.  The patient tolerated the procedure well and without  apparent complications.   FINDINGS:  Residual hiatal hernia with Barrett's esophagus and findings  compatible with gastroparesis with patent gastroduodenal area.   PLAN:  Await biopsy report and discuss the patient's clinical history  with him, consideration for metoclopramide depending on his needs.  The  patient will call  me for results and follow up with me as an outpatient.           ______________________________  Georgiana Spinner, M.D.     GMO/MEDQ  D:  10/02/2007  T:  10/02/2007  Job:  [id number]

## 2010-09-24 NOTE — Op Note (Signed)
Oscar [name], Oscar [name]               ACCOUNT NO.:  [id number]   MEDICAL RECORD NO.:  [id number]          PATIENT TYPE:  AMB   LOCATION:  ENDO                         FACILITY:  [facility]   PHYSICIAN:  Georgiana Spinner, M.D.    DATE OF BIRTH:  27-Nov-1932   DATE OF PROCEDURE:  12/10/2004  DATE OF DISCHARGE:                                 OPERATIVE REPORT   PROCEDURE:  Upper endoscopy.   INDICATIONS:  Abdominal pain.   ANESTHESIA:  Demerol 50, Versed 7 mg.   PROCEDURE:  With the patient mildly sedated in the left lateral decubitus  position, the Olympus videoscopic endoscope was inserted in the mouth,  passed under direct vision through the esophagus which appeared normal to me  at first blush.  There was no evidence of Barrett's.  We entered into the  stomach.  Fundus, body, antrum, duodenal bulb was then entered as was second  portion of duodenum.  From this point, the endoscope was slowly withdrawn  taking circumferential views of duodenal mucosa until the endoscope had been  pulled back into the stomach and placed in retroflexion to view the stomach  from below.  The endoscope was straightened and withdrawn taking  circumferential views of the remaining gastric and esophageal mucosa,  stopping in the distal esophagus where there appeared to be some geographic  distribution of mucosa which I biopsied.  The endoscope was then withdrawn.  The patient's vital signs, pulse oximeter remained stable.  The patient  tolerated the procedure well without apparent complications.   FINDINGS:  Raised mucosa in a geographic pattern in the esophagus of unknown  etiology, biopsied.  Await biopsy report.  The patient will call me for  results and follow-up with me as an outpatient.       GMO/MEDQ  D:  12/10/2004  T:  12/10/2004  Job:  [id number]

## 2010-09-24 NOTE — Procedures (Signed)
[place]  Patient:    Oscar [name], Oscar [name]                      MRN: [id number] Proc. Date: 06/24/99 Adm. Date:  [id number] Attending:  Sharyn Dross CC:         Soyla Murphy. Renne Crigler, M.D.                           Procedure Report  PREOPERATIVE DIAGNOSIS:  Melanotic stools.  POSTOPERATIVE DIAGNOSIS:  Short Barretts esophagus, otherwise normal endoscopic  examination.  PROCEDURE PERFORMED:  Esophagogastroduodenoscopy with biopsies.  MEDICATIONS USED:  Demerol 60 mg IV, Versed 6 mg IV over a 10 to 15-minute period of time.  INSTRUMENT USED:  Olympus video panendoscope.  ENDOSCOPIST:  Dortha Kern, M.D.  INDICATIONS:  This pleasant 77 year old black gentleman known to me in the past  with a history of acid peptic disease that was noted.  It has been relatively stable up until recently.  The patient states that he has been feeling slightly  weak and dizzy at that time.  He started passing melanotic stools that was present. The patient presented to the emergency room whereupon evaluation, his hemoglobin appeared to be within normal limits at that time.  The patient subsequently came to the office and was evaluated for the discomforts.  There was no history of any syncopal episodes associated with this or any history of diaphoresis that was present.  The patient was not taking any medications such as iron or Pepto-Bismol that was noted.  OBJECTIVE FINDINGS:  He is a pleasant gentleman who appears to be in no distress. His vital signs are stable.  The HEENT examination was anicteric.  Neck was supple. Lungs were clear.  Heart had a regular rate and rhythm without heaves, thrills,  murmurs or gallop.  The abdomen was soft.  There was mild tenderness to palpation in the superumbilical region with no rebound or referred tenderness appreciated. No hepatosplenomegaly noted.  Digital rectal exam was deferred at that time. The extremities  showed no cyanosis, clubbing or edema.  PLAN:  To proceed with the endoscopic examination based on the character of the  melena that was present and the symptoms, depending on the results to determine the course of therapy.  INFORMED CONSENT:  The patient was advised of the procedure, the indications and the risks involved.  The patient has agreed to have the procedure performed.  A  video was reviewed and the consent form was obtained.  PREOPERATIVE PREPARATION:  The patient was brought to the endoscopy unit where n IV for IV sedating medication was started.  A monitor was placed on the patient to monitor the patients vital signs and oxygen saturation.  Nasal oxygen at two liters a minute was used and after adequate sedation was performed, the procedure was begun.  DESCRIPTION OF PROCEDURE:  The instrument was advanced with the patient lying in the left lateral position via direct technique without difficulty.  The oropharyngeal epliglottis, vocal cords and piriform sinuses appeared to be grossly within normal limits.  The esophagus was normal without any evidence of acute inflammation, ulcerations, hiatal hernias or varices appreciated.  The Z-line appeared to be approximately 40 cm that was noted.  The gastric area showed a normal mucous lake without any evidence of acute inflammation or ulcerations that was appreciated at this time.  The pylorus was  normal with good peristaltic  activity.  Upon advancement through the pyloric canal, the duodenal bulb and second portion appeared to be grossly within normal limits. The instrument was retracted back where a retroflex view of the cardia showed evidence of relaxed lower esophageal sphincter that was appreciated. Photographs were taken of the relaxation that was noted at this time.  It appeared to be moderate in character at this time.  The instrument was retracted back where there appeared to be evidence of a short   Barretts that was appreciated that was noted. Photographs were taken of the region as the instrument was retracted back at this time.  Biopsies of the Barretts area was noted at this time.  There was no other abnormalities appreciated.  No evidence of any postendoscopic trauma to the mucosal tissue that was appreciated.  The instrument was subsequently removed per orum without difficulty with the patient tolerating the procedure well.  TREATMENT: 1. I am going to recommend treating the patient with a proton pump inhibitor    medication at this time. 2. Would recommend repeating the procedure in approximately six months to one    years period of time.  The patient may need to have periodic following with    this Barretts process to prevent complication noted.  The data has been shown in the past that with short Barretts, treatment with a PI may be beneficial for the patient at this time to possibly delay or prevent neoplastic process that could develop in this patient.  This is not the case with large Barretts, long Barretts that was appreciated but it can be beneficial with the patient at this time.  Thus, I would recommend longterm PPIs for the patient during the interim.  Will await the results of the path report depending on the results to determine the course of therapy. DD:  06/24/99 TD:  06/24/99 Job: [id number]

## 2010-09-24 NOTE — Procedures (Signed)
[place]. [facility]  Patient:    Oscar [name], Oscar [name]                      MRN: [id number] Proc. Date: 10/21/99 Adm. Date:  [id number] Disc. Date: [id number] Attending:  Sharyn Dross                           Procedure Report  PREOPERATIVE DIAGNOSIS:  History of Barretts esophagus with high-grade dysplasia, brought back in for reevaluation.  POSTOPERATIVE DIAGNOSIS:  Barretts esophagus noted.  PROCEDURE:  Esophagogastroduodenoscopy with biopsies.  MEDICATIONS:  Demerol 50 mg IV, Versed 5 mg IV, over a 10-minute period of time.  INSTRUMENT:  The Olympus video panendoscope.  ENDOSCOPIST:  Sharyn Dross., M.D.  INDICATION:  This pleasant 77 year old gentleman was brought back in three months after his previous endoscopic examination, which showed evidence of Barretts esophagus with a high-grade dysplasia at this time.  The biopsy showed no evidence of malignancy, but since there was high-grade dysplasia and angriness noted, the patient was being brought back in on a quarterly basis for re-endoscopies and biopsies.  He is doing relatively well without any major complaints.  OBJECTIVE FINDINGS:  GENERAL:  This is a pleasant gentleman in no distress.  VITAL SIGNS:  His vital signs were stable.  HEENT:  Anicteric.  NECK:  Supple.  LUNGS:  Clear.  HEART:  Regular rate and rhythm without _____, murmurs, or gallops.  ABDOMEN:  Soft, no tenderness, no hepatosplenomegaly appreciated.  EXTREMITIES:  Within normal limits.  PLAN:  I am going to proceed with the endoscopic examination.  INFORMED CONSENT:  The patient was advised of the procedure, indications and risks involved.  He was explained of the reasons for the repeat serial biopsies that will be done on a quarterly basis based on the results of previous biopsy.  The patient has agreed to come back for a reevaluation.  PREOPERATIVE PREPARATION:  The patient was brought into the endoscopist  with the IV, and IV sedating medications were started.  Monitors were placed on the patient to monitor the patients vital signs and oxygen saturation.  Nasal oxygen at 2 L/min. was used and after adequate sedation was performed, the procedure was begun.  PROCEDURE NOTE:  The instrument was advanced with the patient lying in the left lateral position via the _____ technique without difficulty.  Initially the patient would manipulate the instrument to push it away from going in the right direction.  However, with increased medication the instrument was able to advance into the area without difficulty.  The oropharyngeal, epiglottis, and vocal cords appeared to be unremarkable.  The esophagus shows no evidence of acute inflammation or ulcerations in the proximal portion of the esophagus. However, there appeared to be evidence of hiatal hernia with evidence of the Barretts esophagus appreciated at this time.  The gastric area showed a normal mucus lake without any evidence of acute inflammation or ulcerations that was noted.  The pylorus area appeared to be within normal limits but upon advancing through the pyloric canal, the duodenal bulb showed evidence of duodenitis that was noted in the bulb and postbulbar region.  This appeared to be circumferentially and in the bulb region at this time.  It appeared to be mild to slightly moderate in character at this time.  There was no ulceration noted.  The ampulla of Vater was visualized without any abnormalities.  The instrument was  retracted back at this time.  Retroflexed view of the cardia again showed evidence of the hiatal hernia in the gastric region at this time.  The instrument was retracted back into the esophageal area, where what appears to be the Z-line was approximately 48 cm, but the Barretts protruded up to 38 cm at this time. Photographs were taken of this region at this time.  Multiple biopsies were taken (some with difficulty).   There was no other gross abnormality noted.  The instrument was gradually retracted back and removed without difficulty, and the patient tolerated the procedure well.  TREATMENT: 1. Will await the results of the biopsy reports at this time. 2. Will have the patient follow up with me in the office. 3. Presently will recommend repeating the procedure in approximately three to    four months. 4. The patient was initially to have a surgical consultation for this region    at this time and depending upon these results will determine the course of    therapy. DD:  10/21/99 TD:  10/25/99 Job: [id number]

## 2010-09-24 NOTE — Procedures (Signed)
[place]. [facility]  Patient:    Oscar [name], Oscar [name]                      MRN: [id number] Proc. Date: 07/20/99 Adm. Date:  [id number] Attending:  Sharyn Dross CC:         Soyla Murphy. Renne Crigler, M.D.                           Procedure Report  REFERRING PHYSICIAN:  Soyla Murphy. Renne Crigler, M.D.  PREPROCEDURE DIAGNOSIS:  History of colon polyps.  POSTPROCEDURE DIAGNOSIS:  Scattered diverticula, otherwise normal colonoscopic examination to the cecum.  PROCEDURE:  Colonoscopy.  MEDICATIONS:  Demerol 80 mg IV, Versed 8 mg IV over a 10 minute period of time.   INSTRUMENT:  Olympus video colonoscopy.  ENDOSCOPIST:  Sharyn Dross., M.D.  INDICATIONS:  This pleasant 77 year old black gentleman, who is known to me with a history of colon polyps in the past is brought back in for reevaluation of the area.  The patient denies any symptoms of any major GI discomforts from the lower standpoint.  There is no evidence of any bleeding nor any evidence of any weight loss and discomfort noted.  OBJECTIVE FINDINGS:  He is a pleasant gentleman and appears to be in no acute distress.  His vital signs are stable.  His HEENT examination is  anicteric. Neck was supple.  Lungs are clear.  Heart had a regular rate and rhythm without heaves, thrills, murmurs, or gallops.  The abdomen is soft.  No tenderness.  No hepatosplenomegaly.  The extremities are within normal limits.  PLAN:  I am going to proceed with the colonoscopic examination.  INFORMED CONSENT:  The patient was advised of the procedure, and the indications of the risks involved.  The patient has agreed to have the procedure performed. Video was reviewed and consent form obtained.  PREOPERATIVE PREPARATION:  The patient was brought to the endoscopy unit with an IV for IV.  Sedating medication was started.  A monitor was placed on the patient o monitor the patients vital signs and oxygen saturation.  Nasal oxygen at  2 L/min. was used and after adequate sedation was performed the procedure was begun.  FINAL PREP:  The patient was given GoLYTELY and Reglan as a bowel prep.  The patient tolerated the prep well without any complications.  The GoLYTELY prep was excellent.  DESCRIPTION OF PROCEDURE:  The instrument was advanced with the patient lying in the left lateral position via the direct technique to approximately 100 cm approximately in the colon to the cecal region.  This was confirmed by palpation and visualization of the ileocecal valve and the appendiceal orifice.  There appeared to be no gross abnormalities such as masses, polyps, or stricture lesion appreciated.  The vascular pattern appeared to be well within normal limits throughout the entire colon.  The mucosal pattern showed scattered diverticula hat was present, but no evidence of any inflammatory changes noted at this time. There was no increased tortuosity of the colon and no evidence of any internal nor external hemorrhoids upon exiting from the area.  The patient tolerated the procedure well without any complications.  TREATMENT: 1. Conservative management. 2. Would recommend repeating procedure in approximately two years.  If    negative then would proceed with a standard routine of every three    years from that point. DD:  07/20/99 TD:  07/20/99  Job: [id number]

## 2010-09-24 NOTE — Op Note (Signed)
NAME:  ARON, INGE                         ACCOUNT NO.:  [id number]   MEDICAL RECORD NO.:  [id number]                   PATIENT TYPE:  AMB   LOCATION:  ENDO                                 FACILITY:  [facility]   PHYSICIAN:  Georgiana Spinner, M.D.                 DATE OF BIRTH:  03/25/1933   DATE OF PROCEDURE:  DATE OF DISCHARGE:                                 OPERATIVE REPORT   PROCEDURE:  Upper endoscopy.   INDICATIONS:  Status post esophageal resection for cancer with anastomosis.   ANESTHESIA:  Demerol 60 mg, Versed 6 mg.   DESCRIPTION OF PROCEDURE:  With the patient mildly sedated in the left  lateral decubitus position, the Olympus videoscopic endoscope was inserted  in the mouth and passed under direct vision through the esophagus, which  appeared normal except where attached to the stomach and it did appear to be  somewhat inflamed in this area.  Therefore, this was photographed and  biopsied.  We entered into the stomach.  The  fundus, body, antrum, duodenal  bulb, and second portion of the duodenum appeared normal.  From this point,  the endoscope was slowly withdrawn taking circumferential views of the  duodenal mucosa until the endoscope then pulled back into the stomach,  placed in retroflexion and viewed the stomach from below and a large hiatal  hernia was seen and photographed.  There was evidence of ______________ with  the GE junction around the endoscope.  The endoscope was then straightened,  withdrawn, taking circumferential views of the remaining gastric and  esophageal mucosa.  The patient's vital signs and pulse oximetry remained  stable.  The patient tolerated the procedure well without apparent  complication.   FINDINGS:  Changes of mild, possibly esophagitis at the anastomotic site,  photographed and biopsied.  Await biopsy report.  The patient will call me  for results and follow up with me as an outpatient.  Proceed to colonoscopy.                              Georgiana Spinner, M.D.    GMO/MEDQ  D:  05/12/2003  T:  05/12/2003  Job:  [id number]

## 2010-09-24 NOTE — Op Note (Signed)
NAME:  Oscar [name], Oscar [name]                         ACCOUNT NO.:  [id number]   MEDICAL RECORD NO.:  [id number]                   PATIENT TYPE:  AMB   LOCATION:  ENDO                                 FACILITY[facility]   PHYSICIAN:  Georgiana Spinner, M.D.                 DATE OF BIRTH:  06-Jul-1932   DATE OF PROCEDURE:  DATE OF DISCHARGE:                                 OPERATIVE REPORT   PROCEDURE:  Colonoscopy.   INDICATIONS:  Colon cancer screening.   ANESTHESIA:  Demerol 40 mg, Versed 2.   DESCRIPTION OF PROCEDURE:  With the patient mildly sedated and in the left  lateral decubitus position, the Olympus videoscopic colonoscope was inserted  in the rectum and passed under direct vision to the cecum, identified by  ileocecal valve and appendiceal orifice.  The prep was suboptimal.  The  patient had indicated that the prep might be suboptimal because he was not  sure he really wanted to proceed with the exam, but eventually did consent.  From this point, the colonoscope was slowly withdrawn, taking  circumferential views of the colonic mucosa, stopping only in the rectum  which appeared normal on direct and showed hemorrhoids on retroflexed view.  The endoscope was straightened and withdrawn.  The patient's vital signs and  pulse oximetry remained stable.  The patient tolerated the procedure well  without apparent complications.   FINDINGS:  Internal hemorrhoids; otherwise unremarkable colonoscopic  examination.   PLAN:  Consider repeat examination possibly in 5-10 years.                                               Georgiana Spinner, M.D.    GMO/MEDQ  D:  05/12/2003  T:  05/12/2003  Job:  [id number]

## 2011-01-28 LAB — TROPONIN I: Troponin I: <0.01

## 2011-02-17 LAB — CBC
HCT: 43.7
MCV: 94.7
Platelets: 360
WBC: 10.4

## 2011-02-17 LAB — DIFFERENTIAL
Eosinophils Absolute: 0.2
Eosinophils Relative: 2
Lymphs Abs: 2.8
Monocytes Relative: 5

## 2011-02-17 LAB — BASIC METABOLIC PANEL
BUN: 10
Chloride: 99
Potassium: 3.9

## 2011-02-17 LAB — D-DIMER, QUANTITATIVE: D-Dimer, Quant: 0.4

## 2011-02-17 LAB — POCT CARDIAC MARKERS: Myoglobin, poc: 86.3

## 2011-10-23 ENCOUNTER — Encounter (HOSPITAL_COMMUNITY): Payer: Self-pay | Admitting: *Deleted

## 2011-10-23 ENCOUNTER — Emergency Department (HOSPITAL_COMMUNITY)
Admission: EM | Admit: 2011-10-23 | Discharge: 2011-10-23 | Disposition: A | Discharge status: Home / Self Care | Payer: Medicare Other | Source: Home / Self Care | Attending: Emergency Medicine | Admitting: Emergency Medicine

## 2011-10-23 DIAGNOSIS — B009 Herpesviral infection, unspecified: Secondary | ICD-10-CM

## 2011-10-23 DIAGNOSIS — B001 Herpesviral vesicular dermatitis: Secondary | ICD-10-CM

## 2011-10-23 DIAGNOSIS — L259 Unspecified contact dermatitis, unspecified cause: Secondary | ICD-10-CM

## 2011-10-23 DIAGNOSIS — L309 Dermatitis, unspecified: Secondary | ICD-10-CM

## 2011-10-23 HISTORY — DX: Gastro-esophageal reflux disease without esophagitis: K21.9

## 2011-10-23 HISTORY — DX: Essential (primary) hypertension: I10

## 2011-10-23 MED ORDER — ACYCLOVIR 400 MG PO TABS: 400.0000 mg | ORAL_TABLET | Freq: Three times a day (TID) | ORAL | Status: AC

## 2011-10-23 MED ORDER — FLUCONAZOLE 150 MG PO TABS: ORAL_TABLET | ORAL | Status: AC

## 2011-10-23 MED ORDER — HYDROCORTISONE ACE-PRAMOXINE 2.5-1 % EX CREA: 1.0000 "application " | TOPICAL_CREAM | Freq: Three times a day (TID) | CUTANEOUS | Status: DC

## 2011-10-23 MED ORDER — ACYCLOVIR-HYDROCORTISONE 5-1 % EX CREA: 1.0000 "application " | TOPICAL_CREAM | Freq: Every day | CUTANEOUS | Status: DC

## 2011-10-23 NOTE — ED Notes (Signed)
Pt returned prescription 300+ dollars - dr Chaney Malling notified and new prescriptions printed and given to pt

## 2011-10-23 NOTE — ED Provider Notes (Signed)
History     CSN: [id number]  Arrival date & time 10/23/11  1346   First MD Initiated Contact with Patient 10/23/11 1402      Chief Complaint  Patient presents with  . Rash    (Consider location/radiation/quality/duration/timing/severity/associated sxs/prior treatment) HPI Comments: Patient reports having an itchyrash on his scalp, which then spread to his lower face starting a month ago. He was seen in another urgent care, and given 2 shampoos, and the rash in his scalp has resolved. He states that he also started using Domeboro on the rash on his face, which "cleared most of it up". He states that he has since been keeping the rash on his face covered in Vaseline. He also reports a burning, tingling sensation of his lips, followed by painful blisters on his lower lip. There are no intraoral blisters, sore throat. No nausea, vomiting, fevers. He denies rash anywhere else. He states that he is in a significant amount of time out in the sun, without any sunscreen. He denies any history of herpes. He has sexually active with same partner, and admits to having oral sex with her several weeks ago, but states that she is asymptomatic. No aggravating or alleviating factors. He has not tried anything for the rash on his lips.  ROS as noted in HPI. All other ROS negative.   Patient is a 78 y.o. male presenting with rash. The history is provided by the patient. No language interpreter was used.  Rash  This is a new problem. The current episode started more than 1 week ago. The problem has been gradually worsening. The problem is associated with an unknown factor. There has been no fever. The rash is present on the lips and face. The pain has been constant since onset. Associated symptoms include blisters, itching and pain. He has tried steriods for the symptoms. The treatment provided no relief.    Past Medical History  Diagnosis Date  . Hypertension   . Acid reflux   . High cholesterol   .  Cancer     espohageal cancer tx'd surgery  . Diabetes mellitus     controlled with diet and exercise    Past Surgical History  Procedure Date  . Gastric resection     History reviewed. No pertinent family history.  History  Substance Use Topics  . Smoking status: Current Some Day Smoker    Types: Pipe  . Smokeless tobacco: Not on file  . Alcohol Use: Yes      Review of Systems  Skin: Positive for itching and rash.    Allergies  Review of patient's allergies indicates no known allergies.  Home Medications   Current Outpatient Rx  Name Route Sig Dispense Refill  . ATENOLOL 25 MG PO TABS Oral Take 25 mg by mouth daily.    [name] LOSARTAN POTASSIUM 25 MG PO TABS Oral Take 25 mg by mouth daily.    [name] OMEPRAZOLE 20 MG PO CPDR Oral Take 20 mg by mouth daily.    . ACYCLOVIR-HYDROCORTISONE 5-1 % EX CREA Apply externally Apply 1 application topically 5 (five) times daily. X 5 days. Start at symptom onset 1 Tube 1  . FLUCONAZOLE 150 MG PO TABS  1 tab po x 1. May repeat in 72 hours if no improvement 2 tablet 0    BP 127/73  Pulse 63  Temp 98 F (36.7 C) (Oral)  Resp 18  SpO2 100%  Physical Exam  Nursing note and vitals reviewed. Constitutional: He  is oriented to person, place, and time. He appears well-developed and well-nourished.  HENT:  Head: Normocephalic and atraumatic.    Right Ear: Tympanic membrane normal.  Left Ear: Tympanic membrane normal.  Nose: Nose normal.  Mouth/Throat: Uvula is midline and oropharynx is clear and moist. No uvula swelling. No oropharyngeal exudate.       Edematous lips with Herpetic lesions. Flat, moist, macerated, nontender erythema on face, with some fissures no increased temperature compared to the rest phase. see drawing.  Eyes: Conjunctivae and EOM are normal. Pupils are equal, round, and reactive to light.  Neck: Normal range of motion.  Cardiovascular: Normal rate.   Pulmonary/Chest: Effort normal. No respiratory distress.    Abdominal: He exhibits no distension.  Musculoskeletal: Normal range of motion.  Lymphadenopathy:    He has no cervical adenopathy.  Neurological: He is alert and oriented to person, place, and time.  Skin: Skin is warm and dry.  Psychiatric: He has a normal mood and affect. His behavior is normal.    ED Course  Procedures (including critical care time)   Labs Reviewed  HERPES SIMPLEX VIRUS CULTURE   No results found.   1. Herpes labialis   2. Dermatitis      MDM  Lip rash appears herpetic, sending this off for HSV culture. Will start him on topical acyclovir or in hydrocortisone, she appears to have extensive swelling. Advised patient to get some lip balm, preferably with some sunscreen, to help remind him to stop licking his lips. Leave that patient's original dermatitis has resolved, and that he's been keeping it covered in Vaseline for the past few weeks. Appears to be secondarily infected with yeast. Will give him Diflucan.  Luiz Blare, MD 10/23/11 1623

## 2011-10-23 NOTE — Discharge Instructions (Signed)
Take the medication as written. Give [place] a working phone number so that we can contact you if needed. Get some flavored lip balm, preferably with sunscreen, to hremind you to stop licking your lips. This will make your symptoms worse. Stop applying vaseline to your face and stay out of the sun until this resolves. You may use a light, non perfumed, facial moisturizing cream instead. Return if you get worse, have a fever >100.4, or for any concerns.   Go to www.goodrx.com to look up your medications. This will give you a list of where you can find your prescriptions at the most affordable prices.

## 2011-10-23 NOTE — ED Notes (Signed)
Pt with c/o onset of rash x 2 weeks on chin/lips/and ears - has been using otc meds without relief - now lips swelling and burning pain inner lips - per pt thought he had poison ivy - per pt was out in yard cutting poison ivy - pt was treated approx 3 weeks ago rash head and face [place] had resolved

## 2011-10-26 LAB — HERPES SIMPLEX VIRUS CULTURE: Culture: NOT DETECTED

## 2012-12-07 ENCOUNTER — Encounter: Payer: Self-pay | Admitting: Cardiovascular Disease

## 2012-12-07 ENCOUNTER — Other Ambulatory Visit: Payer: Self-pay | Admitting: Cardiovascular Disease

## 2012-12-07 ENCOUNTER — Other Ambulatory Visit: Payer: Self-pay | Admitting: *Deleted

## 2012-12-07 DIAGNOSIS — I350 Nonrheumatic aortic (valve) stenosis: Secondary | ICD-10-CM

## 2012-12-07 DIAGNOSIS — I253 Aneurysm of heart: Secondary | ICD-10-CM

## 2012-12-07 LAB — COMPREHENSIVE METABOLIC PANEL
ALT: 22 U/L (ref 0–53)
AST: 49 U/L — ABNORMAL HIGH (ref 0–37)
Creat: 1.13 mg/dL (ref 0.50–1.35)
Sodium: 137 mEq/L (ref 135–145)
Total Bilirubin: 0.9 mg/dL (ref 0.3–1.2)
Total Protein: 6.9 g/dL (ref 6.0–8.3)

## 2012-12-07 LAB — CBC WITH DIFFERENTIAL/PLATELET
Basophils Absolute: 0.1 10*3/uL (ref 0.0–0.1)
Eosinophils Absolute: 0.1 10*3/uL (ref 0.0–0.7)
Eosinophils Relative: 2 % (ref 0–5)
Lymphocytes Relative: 32 % (ref 12–46)
Lymphs Abs: 2.5 10*3/uL (ref 0.7–4.0)
MCH: 32.3 pg (ref 26.0–34.0)
MCV: 91.2 fL (ref 78.0–100.0)
Neutrophils Relative %: 56 % (ref 43–77)
Platelets: 285 10*3/uL (ref 150–400)
RBC: 4.55 MIL/uL (ref 4.22–5.81)
RDW: 12.7 % (ref 11.5–15.5)
WBC: 7.8 10*3/uL (ref 4.0–10.5)

## 2012-12-11 ENCOUNTER — Encounter: Payer: Self-pay | Admitting: Cardiovascular Disease

## 2012-12-13 ENCOUNTER — Telehealth (HOSPITAL_COMMUNITY): Payer: Self-pay | Admitting: Cardiovascular Disease

## 2012-12-13 NOTE — Telephone Encounter (Signed)
[employer] denied Echo scheduled for 12/17/12 because the patient was asymptomatic at last office visit. Per [facility] cancel Echo. Patient was notified of cancellation, reasoning and voiced understanding. I notified the patient that they are still scheduled for a doppler at 4 pm on 12/17/12 and that they should still come in for this appt. Patient voiced understanding and requested that a nurse call him to notify if there are any dietary changes he must make prior to his ultrasound. Please call the patient at [phone] this afternoon or tomorrow.

## 2012-12-17 ENCOUNTER — Ambulatory Visit (HOSPITAL_COMMUNITY): Payer: Medicare Other

## 2012-12-17 ENCOUNTER — Ambulatory Visit (HOSPITAL_COMMUNITY)
Admission: RE | Admit: 2012-12-17 | Discharge: 2012-12-17 | Disposition: A | Discharge status: Home / Self Care | Payer: Medicare Other | Source: Ambulatory Visit | Attending: Cardiovascular Disease | Admitting: Cardiovascular Disease

## 2012-12-17 DIAGNOSIS — I253 Aneurysm of heart: Secondary | ICD-10-CM | POA: Insufficient documentation

## 2012-12-17 NOTE — Progress Notes (Signed)
Aorta Duplex Completed. Shalik Sanfilippo, BS, RDMS, RVT  

## 2012-12-25 ENCOUNTER — Telehealth (HOSPITAL_COMMUNITY): Payer: Self-pay | Admitting: Cardiovascular Disease

## 2012-12-27 ENCOUNTER — Telehealth (HOSPITAL_COMMUNITY): Payer: Self-pay | Admitting: Cardiovascular Disease

## 2012-12-27 NOTE — Telephone Encounter (Signed)
Pt called and stated that he does not want to have the echo until November or December.

## 2013-01-03 ENCOUNTER — Emergency Department (HOSPITAL_COMMUNITY)
Admission: EM | Admit: 2013-01-03 | Discharge: 2013-01-03 | Disposition: A | Discharge status: Home / Self Care | Payer: Medicare Other | Source: Home / Self Care

## 2013-01-03 ENCOUNTER — Encounter (HOSPITAL_COMMUNITY): Payer: Self-pay | Admitting: Emergency Medicine

## 2013-01-03 DIAGNOSIS — T63481A Toxic effect of venom of other arthropod, accidental (unintentional), initial encounter: Secondary | ICD-10-CM

## 2013-01-03 DIAGNOSIS — T6391XA Toxic effect of contact with unspecified venomous animal, accidental (unintentional), initial encounter: Secondary | ICD-10-CM

## 2013-01-03 MED ORDER — METHYLPREDNISOLONE SODIUM SUCC 125 MG IJ SOLR
INTRAMUSCULAR | Status: AC
  Filled 2013-01-03: qty 2

## 2013-01-03 MED ORDER — DIPHENHYDRAMINE HCL 50 MG/ML IJ SOLN
25.0000 mg | Freq: Once | INTRAMUSCULAR | Status: AC
  Administered 2013-01-03: 25 mg via INTRAMUSCULAR

## 2013-01-03 MED ORDER — DIPHENHYDRAMINE HCL 50 MG/ML IJ SOLN
INTRAMUSCULAR | Status: AC
  Filled 2013-01-03: qty 1

## 2013-01-03 MED ORDER — TRIAMCINOLONE ACETONIDE 40 MG/ML IJ SUSP: 60.0000 mg | Freq: Once | INTRAMUSCULAR | Status: DC

## 2013-01-03 MED ORDER — METHYLPREDNISOLONE SODIUM SUCC 125 MG IJ SOLR
125.0000 mg | Freq: Once | INTRAMUSCULAR | Status: AC
  Administered 2013-01-03: 125 mg via INTRAMUSCULAR

## 2013-01-03 NOTE — ED Provider Notes (Signed)
CSN: [id number]     Arrival date & time 01/03/13  1225 History   First MD Initiated Contact with Patient 01/03/13 1235     Chief Complaint  Patient presents with  . Insect Bite   (Consider location/radiation/quality/duration/timing/severity/associated sxs/prior Treatment) HPI Comments: 79 year old man presents to the urgent care after he was either stung or bitten by an insect approximately 55-60 minutes prior to arrival. He is complaining of 6 severe generalized itching. Started out in the hands and arms followed by itching in the feet and legs. Is now complaining of itching all over. He is scratching vigorously. He denies rash, swelling, trouble breathing, choking, swelling in the mouth or other associated symptoms.   Past Medical History  Diagnosis Date  . Hypertension   . Acid reflux   . High cholesterol   . Cancer     espohageal cancer tx'd surgery  . Diabetes mellitus     controlled with diet and exercise   Past Surgical History  Procedure Laterality Date  . Gastric resection     History reviewed. No pertinent family history. History  Substance Use Topics  . Smoking status: Current Some Day Smoker    Types: Pipe  . Smokeless tobacco: Not on file  . Alcohol Use: Yes    Review of Systems  Constitutional: Negative for fever and chills.  HENT: Negative.   Respiratory: Negative for cough, choking, chest tightness, shortness of breath and wheezing.   Cardiovascular: Negative for chest pain.  Gastrointestinal: Negative.   Skin: Negative for rash.  Neurological: Positive for dizziness. Negative for tremors, syncope, speech difficulty and headaches.    Allergies  Bee venom  Home Medications   Current Outpatient Rx  Name  Route  Sig  Dispense  Refill  . atenolol (TENORMIN) 25 MG tablet   Oral   Take 25 mg by mouth daily.         [name] losartan (COZAAR) 25 MG tablet   Oral   Take 25 mg by mouth daily.         [name] omeprazole (PRILOSEC) 20 MG capsule   Oral   Take  20 mg by mouth daily.         . Pramoxine-HC (HYDROCORTISONE ACE-PRAMOXINE) 2.5-1 % CREA   Apply externally   Apply 1 application topically 3 (three) times daily. For painful rash. May use on face   57 g   0    BP 145/73  Pulse 85  Temp(Src) 98 F (36.7 C) (Oral)  Resp 18  SpO2 93% Physical Exam  Nursing note and vitals reviewed. Constitutional: He is oriented to person, place, and time. He appears well-developed and well-nourished.  HENT:  Mouth/Throat: Oropharynx is clear and moist. No oropharyngeal exudate.  No edema or erythema to the intraoral structures or oropharynx.  Eyes: Conjunctivae and EOM are normal.  Neck: Normal range of motion. Neck supple.  Cardiovascular: Normal rate, regular rhythm and normal heart sounds.   Pulmonary/Chest: Effort normal and breath sounds normal. No respiratory distress. He has no wheezes. He has no rales.  Musculoskeletal: He exhibits no edema.  Lymphadenopathy:    He has no cervical adenopathy.  Neurological: He is alert and oriented to person, place, and time. He exhibits normal muscle tone.  Skin: Skin is warm and dry. No rash noted. He is not diaphoretic.  Scratching vigorously. No rash is seen although there does appear to be subcutaneous erythema of the forearms. No urticarial wheals. The side of the body or staining is  located on the right distal forearm ulnar aspect. No surrounding swelling or erythema ,it is actually difficult to see.  Psychiatric: He has a normal mood and affect.    ED Course  Procedures (including critical care time) Labs Review Labs Reviewed - No data to display Imaging Review No results found.  MDM   1. Insect sting allergy, current reaction, initial encounter    1315 hours: Patient states he is feeling much better and drowsy, and wants to go home.[name] His itching has abated. Has not developed a rash, trouble breathing or wheezing. No edema. Recommend taking Benadryl 25-50 mg every 4-6 hours as needed  for itching for the next 24 hours. For any worsening may return and instructions as on page one. Patient is discharged in a stable and improved condition.    Hayden Rasmussen, NP 01/03/13 1323  Hayden Rasmussen, NP 01/03/13 1325

## 2013-01-03 NOTE — ED Notes (Addendum)
C/o insect bite right forearm today while gathering up tools.  Patient took benadryl right afterwards

## 2013-01-05 NOTE — ED Provider Notes (Signed)
Medical screening examination/treatment/procedure(s) were performed by a resident physician or non-physician practitioner and as the supervising physician I was immediately available for consultation/collaboration.  Mylo Driskill, MD   Tom Ragsdale S Rayjon Wery, MD 01/05/13 0803 

## 2013-07-04 ENCOUNTER — Ambulatory Visit: Payer: Medicare Other | Admitting: Internal Medicine

## 2013-07-08 ENCOUNTER — Encounter: Payer: Self-pay | Admitting: *Deleted

## 2013-07-09 ENCOUNTER — Encounter: Payer: Self-pay | Admitting: Internal Medicine

## 2013-07-09 ENCOUNTER — Ambulatory Visit (INDEPENDENT_AMBULATORY_CARE_PROVIDER_SITE_OTHER): Payer: Medicare Other | Admitting: Internal Medicine

## 2013-07-09 VITALS — BP 130/70 | HR 74 | Ht 74.0 in | Wt 224.7 lb

## 2013-07-09 DIAGNOSIS — I359 Nonrheumatic aortic valve disorder, unspecified: Secondary | ICD-10-CM

## 2013-07-09 DIAGNOSIS — I351 Nonrheumatic aortic (valve) insufficiency: Secondary | ICD-10-CM

## 2013-07-09 DIAGNOSIS — I10 Essential (primary) hypertension: Secondary | ICD-10-CM

## 2013-07-09 MED ORDER — ATENOLOL 25 MG PO TABS: 25.0000 mg | ORAL_TABLET | Freq: Every day | ORAL | Status: DC

## 2013-07-09 MED ORDER — LOSARTAN POTASSIUM-HCTZ 100-25 MG PO TABS: 0.5000 | ORAL_TABLET | Freq: Every day | ORAL | Status: DC

## 2013-07-09 NOTE — Patient Instructions (Signed)
Please schedule your echocardiogram.   Your physician wants you to follow-up in: 1 year. You will receive a reminder letter in the mail two months in advance. If you don't receive a letter, please call our office to schedule the follow-up appointment.

## 2013-07-09 NOTE — Progress Notes (Signed)
OFFICE NOTE  Chief Complaint:  No complaints, establish new cardiologist  Primary Care Physician: Horatio Pel, MD  HPI:  Oscar [name] is a pleasant 80 year old male who was recently followed by Dr. Rollene Fare. He is here to establish new cardiologist. Last medical history significant for hypertension and in 2011 he had an echocardiogram which showed a normal EF greater than 55%, mild proximal septal thickening, mitral annular calcification with no regurgitation. There was mild aortic insufficiency and sclerosis of the aortic root. Guesses a history of esophageal cancer in 2000 and had esophagectomy at the [facility] in [place]. In the past he said palpitations but those are resolved. He also has a mild dyslipidemia but is not on medication for that.  PMHx:  Past Medical History  Diagnosis Date  . Hypertension   . Acid reflux   . Hyperlipidemia   . Esophageal cancer     tx'd surgery - abdominal & right posterior chest approach (2001) - [place], [place]  . Diabetes mellitus     controlled with diet and exercise  . Gout   . ED (erectile dysfunction)   . Lipoma   . Colon polyps   . Tinea versicolor   . Osteoarthritis   . BPH (benign prostatic hyperplasia)   . Tinnitus   . DDD (degenerative disc disease)   . First degree AV block   . Hemorrhoids   . Obesity   . Barrett's esophagus   . History of palpitations     Past Surgical History  Procedure Laterality Date  . Gastric resection    . Esophageal cancer surgery  2000, 2001  . Bilateral olecranon bursal excisions    . Hemorrhoid surgery    . Vasectomy    . Transthoracic echocardiogram  2011    EF=>55%, mild mitral annular calcif, mild calcif of MV apparatus, mild TR, normal RSVP, mild AV regurg, aortic root sclerosis/calcifiication  . Nm myocar perf wall motion  2011    dipyridamole myoview - stress images show medium in size, moderate in intensity perfusion defect in basal inferior & mid inferior walls with mild defect  reversibility at rest, EF 64%    FAMHx:  Family History  Problem Relation Age of Onset  . Diabetes Mother   . CAD Mother   . Stroke Sister     SOCHx:   reports that he has been smoking Pipe.  He does not have any smokeless tobacco history on file. He reports that he drinks about 3.5 ounces of alcohol per week. He reports that he does not use illicit drugs.  ALLERGIES:  Allergies  Allergen Reactions  . Bee Venom     ROS: A comprehensive review of systems was negative except for: Musculoskeletal: positive for arthralgias  HOME MEDS: Current Outpatient Prescriptions  Medication Sig Dispense Refill  . acetaminophen-codeine (TYLENOL #3) 300-30 MG per tablet 1 tablet as needed.      [name] aspirin 81 MG tablet Take 81 mg by mouth daily.      [name] atenolol (TENORMIN) 25 MG tablet Take 25 mg by mouth daily.      [name] losartan-hydrochlorothiazide (HYZAAR) 100-25 MG per tablet Take 0.5 tablets by mouth daily.       . Multiple Vitamin (MULTIVITAMIN) tablet Take 1 tablet by mouth daily.      . Omega-3 Fatty Acids (FISH OIL) 1000 MG CAPS Take 1 capsule by mouth daily.      [name] omeprazole (PRILOSEC) 20 MG capsule Take 20 mg by mouth 2 (  two) times daily before a meal.       . Saw Palmetto, Serenoa repens, (SAW PALMETTO PO) Take 2 capsules by mouth daily.      . vitamin B-12 (CYANOCOBALAMIN) 1000 MCG tablet Take 1,000 mcg by mouth daily.       No current facility-administered medications for this visit.    LABS/IMAGING: No results found for this or any previous visit (from the past 48 hour(s)). No results found.  VITALS: BP 130/70  Pulse 74  Ht 6\' 2"  (1.88 m)  Wt 224 lb 11.2 oz (101.923 kg)  BMI 28.84 kg/m2  EXAM: General appearance: alert and no distress Neck: no carotid bruit and no JVD Lungs: clear to auscultation bilaterally Heart: regular rate and rhythm, S1, S2 normal, no murmur, click, rub or gallop Abdomen: soft, non-tender; bowel sounds normal; no masses,  no  organomegaly Extremities: extremities normal, atraumatic, no cyanosis or edema Pulses: 2+ and symmetric Skin: Skin color, texture, turgor normal. No rashes or lesions Neurologic: Grossly normal Psych: Mood, affect normal  EKG: Normal sinus rhythm at 74  ASSESSMENT: 1. Mild aortic insufficiency 2. Hypertension-controlled 3. Dyslipidemia-diet controlled 4. History of esophageal cancer  PLAN: 1.   Mr. Kramp has good control of his blood pressure. He is asymptomatic and denies any chest pain or worsening shortness of breath with exertion. His main complaints have to do with arthritis which limits his golf game. He did have mild aortic insufficiency in 2011 and a repeat echocardiogram was ordered this past August, but never obtained. I would agree with rechecking his valve and LV dimensions. He is agreeable to another echocardiogram. Finally, he did have an aortic Doppler performed last year which was normal.  Plan to see him back annually or sooner as necessary and we'll contact him with results of his echo.  Pixie Casino, MD, [facility] Attending Cardiologist [facility]  Jacquese Hackman C 07/09/2013, 11:25 AM

## 2013-07-16 ENCOUNTER — Ambulatory Visit (HOSPITAL_COMMUNITY)
Admission: RE | Admit: 2013-07-16 | Discharge: 2013-07-16 | Disposition: A | Discharge status: Home / Self Care | Payer: Medicare Other | Source: Ambulatory Visit | Attending: Cardiovascular Disease | Admitting: Cardiovascular Disease

## 2013-07-16 DIAGNOSIS — I359 Nonrheumatic aortic valve disorder, unspecified: Secondary | ICD-10-CM

## 2013-07-16 DIAGNOSIS — I350 Nonrheumatic aortic (valve) stenosis: Secondary | ICD-10-CM

## 2013-07-16 DIAGNOSIS — I700 Atherosclerosis of aorta: Secondary | ICD-10-CM | POA: Insufficient documentation

## 2013-07-16 NOTE — Progress Notes (Signed)
2D Echo Performed 07/16/2013    Taiwan Millon, RCS  

## 2013-08-01 ENCOUNTER — Encounter: Payer: Self-pay | Admitting: Internal Medicine

## 2013-08-01 ENCOUNTER — Ambulatory Visit (INDEPENDENT_AMBULATORY_CARE_PROVIDER_SITE_OTHER): Payer: Medicare Other | Admitting: Internal Medicine

## 2013-08-01 VITALS — BP 120/60 | HR 66 | Ht 74.0 in | Wt 228.0 lb

## 2013-08-01 DIAGNOSIS — K219 Gastro-esophageal reflux disease without esophagitis: Secondary | ICD-10-CM

## 2013-08-01 DIAGNOSIS — Z8501 Personal history of malignant neoplasm of esophagus: Secondary | ICD-10-CM

## 2013-08-01 DIAGNOSIS — R1310 Dysphagia, unspecified: Secondary | ICD-10-CM

## 2013-08-01 DIAGNOSIS — K2270 Barrett's esophagus without dysplasia: Secondary | ICD-10-CM

## 2013-08-01 NOTE — Progress Notes (Signed)
HISTORY OF PRESENT ILLNESS:  Oscar [name] is a 80 y.o. male with multiple medical problems and reported esophageal cancer for which she underwent surgery in [place] in 2000 and 2001. No details. He was initially seen in this office 07/13/2009. Please see that dictation for details. Has seen multiple gastroenterologists. Has a history of Barrett's esophagus with high-grade dysplasia in 2001. Has had periodic followup surveillance endoscopies, most recently with Dr. Jim Desanctis in February 2010. Time of his last visit it was recommended that he have surveillance upper endoscopy. The patient agreed contact the office to set up the examination, but never did. He presents today regarding the same, at the encouragement of his PCP. He continues on omeprazole 20 mg twice daily. Occasional intermittent solid food dysphagia is unchanged. Occasional postprandial lower abdominal discomfort mentioned as well. No weight loss. No other complaints.  REVIEW OF SYSTEMS:  All non-GI ROS negative except for allergy, arthritis, back pain, hearing problems, increased urination  Past Medical History  Diagnosis Date  . Hypertension   . Acid reflux   . Hyperlipidemia   . Esophageal cancer     tx'd surgery - abdominal & right posterior chest approach (2001) - [place], [place]  . Diabetes mellitus     controlled with diet and exercise  . Gout   . ED (erectile dysfunction)   . Lipoma   . Colon polyps   . Tinea versicolor   . Osteoarthritis   . BPH (benign prostatic hyperplasia)   . Tinnitus   . DDD (degenerative disc disease)   . First degree AV block   . Hemorrhoids   . Obesity   . Barrett's esophagus   . History of palpitations     Past Surgical History  Procedure Laterality Date  . Gastric resection    . Esophageal cancer surgery  2000, 2001  . Bilateral olecranon bursal excisions    . Hemorrhoid surgery    . Vasectomy    . Transthoracic echocardiogram  2011    EF=>55%, mild mitral annular calcif, mild  calcif of MV apparatus, mild TR, normal RSVP, mild AV regurg, aortic root sclerosis/calcifiication  . Nm myocar perf wall motion  2011    dipyridamole myoview - stress images show medium in size, moderate in intensity perfusion defect in basal inferior & mid inferior walls with mild defect reversibility at rest, EF 64%    Social History SHAKEEL DISNEY  reports that he has been smoking Cigars and Pipe.  He has never used smokeless tobacco. He reports that he drinks about 3.5 ounces of alcohol per week. He reports that he does not use illicit drugs.  family history includes CAD in his mother; Diabetes in his mother; Stroke in his sister. There is no history of Colon cancer.  Allergies  Allergen Reactions  . Bee Venom        PHYSICAL EXAMINATION: Vital signs: BP 120/60  Pulse 66  Ht 6\' 2"  (1.88 m)  Wt 228 lb (103.42 kg)  BMI 29.26 kg/m2 General: Well-developed, well-nourished, no acute distress HEENT: Sclerae are anicteric, conjunctiva pink. Oral mucosa intact Lungs: Clear Heart: Regular Abdomen: soft, nontender, nondistended, no obvious ascites, no peritoneal signs, normal bowel sounds. No organomegaly. Previous surgical incisions well-healed Extremities: No edema Psychiatric: alert and oriented x3. Cooperative     ASSESSMENT:  #1. History of esophageal cancer status post partial esophagectomy #2. Mild intermittent solid food dysphagia. #3. History of residual Barrett's. Overdue for surveillance. Last exam 2010 #4. Colon cancer screening  up-to-date. Last examination 2010 was normal. Aged out of surveillance    PLAN:  #1. Continue PPI #2. Surveillance endoscopy with biopsies.The nature of the procedure, as well as the risks, benefits, and alternatives were carefully and thoroughly reviewed with the patient. Ample time for discussion and questions allowed. The patient understood, was satisfied, and agreed to proceed. #3. Ongoing general medical care with Dr. Shelia Media.

## 2013-08-01 NOTE — Patient Instructions (Addendum)
You have been scheduled for an endoscopy with propofol. Please follow written instructions given to you at your visit today. If you use inhalers (even only as needed), please bring them with you on the day of your procedure. Your physician has requested that you go to www.startemmi.com and enter the access code given to you at your visit today. This web site gives a general overview about your procedure. However, you should still follow specific instructions given to you by our office regarding your preparation for the procedure.  MYCHART User: [place]: [email or web address] Security: Gwendel Hanson  All of these answers should be in upper case letters.

## 2013-08-06 ENCOUNTER — Encounter: Payer: Self-pay | Admitting: Internal Medicine

## 2013-08-14 ENCOUNTER — Encounter: Payer: Self-pay | Admitting: Internal Medicine

## 2013-08-14 ENCOUNTER — Ambulatory Visit (AMBULATORY_SURGERY_CENTER): Payer: Medicare Other | Admitting: Internal Medicine

## 2013-08-14 VITALS — BP 151/86 | HR 60 | Temp 96.6°F | Resp 14 | Ht 74.0 in | Wt 228.0 lb

## 2013-08-14 DIAGNOSIS — K2270 Barrett's esophagus without dysplasia: Secondary | ICD-10-CM

## 2013-08-14 DIAGNOSIS — K219 Gastro-esophageal reflux disease without esophagitis: Secondary | ICD-10-CM

## 2013-08-14 MED ORDER — SODIUM CHLORIDE 0.9 % IV SOLN: 500.0000 mL | INTRAVENOUS | Status: DC

## 2013-08-14 NOTE — Progress Notes (Signed)
Lidocaine-40mg IV prior to Propofol InductionPropofol given over incremental dosages 

## 2013-08-14 NOTE — Progress Notes (Signed)
Called to room to assist during endoscopic procedure.  Patient ID and intended procedure confirmed with present staff. Received instructions for my participation in the procedure from the performing physician.  

## 2013-08-14 NOTE — Patient Instructions (Signed)

## 2013-08-14 NOTE — Op Note (Signed)
[facility]  [address] [place], [address]   ENDOSCOPY PROCEDURE REPORT  PATIENT: Creek, Gan  MR#: [id number] BIRTHDATE: 03/01/1933 , 80  yrs. old GENDER: Male ENDOSCOPIST: Eustace Quail, MD REFERRED BY:  Alden Server, M.D. PROCEDURE DATE:  08/14/2013 PROCEDURE:  EGD w/ biopsy ASA CLASS:     Class II INDICATIONS:  Surveillance. Had high-grade dysplasia with esophagectomy at [facility] in 2001. Last EGD with biopsies showed Barrett's 2010  (Dr. Lajoyce Corners ). MEDICATIONS: MAC sedation, administered by CRNA and propofol (Diprivan) 100mg  IV TOPICAL ANESTHETIC: none  DESCRIPTION OF PROCEDURE: After the risks benefits and alternatives of the procedure were thoroughly explained, informed consent was obtained.  The LB [id number] endoscope was introduced through the mouth and advanced to the second portion of the duodenum. Without limitations.  The instrument was slowly withdrawn as the mucosa was fully examined.     EXAM:The most proximal esophagus was normal.  There was evidence of prior distal esophageal resection with gastroenteric anastomosis located at 25 cm.  Small tongue of columnar Type mucosa.? Barrett's.  Biopsies taken.  The post surgical stomach was normal as was the duodenum.  Retroflexed views revealed no abnormalities. The scope was then withdrawn from the patient and the procedure completed.  COMPLICATIONS: There were no complications. ENDOSCOPIC IMPRESSION: The most proximal esophagus was normal.  There was evidence of prior distal esophageal resection with gastroenteric anastomosis located at 25 cm.  Small tongue of columnar Type mucosa.? Barrett's. Biopsies taken.  The post surgical stomach was normal as was the duodenum.  RECOMMENDATIONS: 1.  Continue current medications 2.  Await biopsy results. Unlikely to need future surveillance  REPEAT EXAM:  eSigned:  Eustace Quail, MD 08/14/2013 1:04 PM   YQ:[id number] D Shelia Media, MD and  The Patient

## 2013-08-14 NOTE — Progress Notes (Signed)
No complaints noted in the recovery room. Maw   

## 2013-08-15 ENCOUNTER — Telehealth: Payer: Self-pay | Admitting: *Deleted

## 2013-08-15 NOTE — Telephone Encounter (Signed)
Message left

## 2013-08-19 ENCOUNTER — Encounter: Payer: Self-pay | Admitting: Internal Medicine

## 2014-05-28 DIAGNOSIS — I10 Essential (primary) hypertension: Secondary | ICD-10-CM | POA: Diagnosis not present

## 2014-05-28 DIAGNOSIS — E119 Type 2 diabetes mellitus without complications: Secondary | ICD-10-CM | POA: Diagnosis not present

## 2014-05-28 DIAGNOSIS — R42 Dizziness and giddiness: Secondary | ICD-10-CM | POA: Diagnosis not present

## 2014-05-28 DIAGNOSIS — Z7982 Long term (current) use of aspirin: Secondary | ICD-10-CM | POA: Diagnosis not present

## 2014-06-02 DIAGNOSIS — E119 Type 2 diabetes mellitus without complications: Secondary | ICD-10-CM | POA: Diagnosis not present

## 2014-07-03 DIAGNOSIS — E119 Type 2 diabetes mellitus without complications: Secondary | ICD-10-CM | POA: Diagnosis not present

## 2014-07-14 DIAGNOSIS — I10 Essential (primary) hypertension: Secondary | ICD-10-CM | POA: Diagnosis not present

## 2014-07-14 DIAGNOSIS — M545 Low back pain: Secondary | ICD-10-CM | POA: Diagnosis not present

## 2014-07-14 DIAGNOSIS — Z0000 Encounter for general adult medical examination without abnormal findings: Secondary | ICD-10-CM | POA: Diagnosis not present

## 2014-07-14 DIAGNOSIS — E118 Type 2 diabetes mellitus with unspecified complications: Secondary | ICD-10-CM | POA: Diagnosis not present

## 2014-07-14 DIAGNOSIS — K219 Gastro-esophageal reflux disease without esophagitis: Secondary | ICD-10-CM | POA: Diagnosis not present

## 2014-07-14 DIAGNOSIS — E780 Pure hypercholesterolemia: Secondary | ICD-10-CM | POA: Diagnosis not present

## 2014-07-14 DIAGNOSIS — Z125 Encounter for screening for malignant neoplasm of prostate: Secondary | ICD-10-CM | POA: Diagnosis not present

## 2014-07-14 DIAGNOSIS — E119 Type 2 diabetes mellitus without complications: Secondary | ICD-10-CM | POA: Diagnosis not present

## 2014-07-18 DIAGNOSIS — Z0000 Encounter for general adult medical examination without abnormal findings: Secondary | ICD-10-CM | POA: Diagnosis not present

## 2014-07-18 DIAGNOSIS — E780 Pure hypercholesterolemia: Secondary | ICD-10-CM | POA: Diagnosis not present

## 2014-07-29 DIAGNOSIS — H9313 Tinnitus, bilateral: Secondary | ICD-10-CM | POA: Diagnosis not present

## 2014-07-29 DIAGNOSIS — H9193 Unspecified hearing loss, bilateral: Secondary | ICD-10-CM | POA: Diagnosis not present

## 2014-08-01 DIAGNOSIS — E119 Type 2 diabetes mellitus without complications: Secondary | ICD-10-CM | POA: Diagnosis not present

## 2014-09-01 DIAGNOSIS — E119 Type 2 diabetes mellitus without complications: Secondary | ICD-10-CM | POA: Diagnosis not present

## 2014-09-02 DIAGNOSIS — N401 Enlarged prostate with lower urinary tract symptoms: Secondary | ICD-10-CM | POA: Diagnosis not present

## 2014-09-02 DIAGNOSIS — N138 Other obstructive and reflux uropathy: Secondary | ICD-10-CM | POA: Diagnosis not present

## 2014-09-02 DIAGNOSIS — N5201 Erectile dysfunction due to arterial insufficiency: Secondary | ICD-10-CM | POA: Diagnosis not present

## 2014-09-12 ENCOUNTER — Emergency Department (INDEPENDENT_AMBULATORY_CARE_PROVIDER_SITE_OTHER): Payer: Medicare Other

## 2014-09-12 ENCOUNTER — Emergency Department (HOSPITAL_COMMUNITY): Payer: Medicare Other

## 2014-09-12 ENCOUNTER — Encounter (HOSPITAL_COMMUNITY): Payer: Self-pay | Admitting: Nurse Practitioner

## 2014-09-12 ENCOUNTER — Encounter (HOSPITAL_COMMUNITY): Payer: Self-pay | Admitting: Emergency Medicine

## 2014-09-12 ENCOUNTER — Emergency Department (INDEPENDENT_AMBULATORY_CARE_PROVIDER_SITE_OTHER)
Admission: EM | Admit: 2014-09-12 | Discharge: 2014-09-12 | Disposition: A | Discharge status: Nursing Home (Includes ICF) | Payer: Medicare Other | Source: Home / Self Care | Attending: Family Medicine | Admitting: Family Medicine

## 2014-09-12 ENCOUNTER — Emergency Department (HOSPITAL_COMMUNITY)
Admission: EM | Admit: 2014-09-12 | Discharge: 2014-09-12 | Disposition: A | Discharge status: Home / Self Care | Payer: Medicare Other | Attending: Emergency Medicine | Admitting: Emergency Medicine

## 2014-09-12 DIAGNOSIS — Z86010 Personal history of colonic polyps: Secondary | ICD-10-CM | POA: Diagnosis not present

## 2014-09-12 DIAGNOSIS — R197 Diarrhea, unspecified: Secondary | ICD-10-CM

## 2014-09-12 DIAGNOSIS — Z8669 Personal history of other diseases of the nervous system and sense organs: Secondary | ICD-10-CM | POA: Diagnosis not present

## 2014-09-12 DIAGNOSIS — I10 Essential (primary) hypertension: Secondary | ICD-10-CM | POA: Diagnosis not present

## 2014-09-12 DIAGNOSIS — Z87438 Personal history of other diseases of male genital organs: Secondary | ICD-10-CM | POA: Insufficient documentation

## 2014-09-12 DIAGNOSIS — E669 Obesity, unspecified: Secondary | ICD-10-CM | POA: Insufficient documentation

## 2014-09-12 DIAGNOSIS — K5669 Other intestinal obstruction: Secondary | ICD-10-CM

## 2014-09-12 DIAGNOSIS — K529 Noninfective gastroenteritis and colitis, unspecified: Secondary | ICD-10-CM | POA: Diagnosis not present

## 2014-09-12 DIAGNOSIS — Z7982 Long term (current) use of aspirin: Secondary | ICD-10-CM | POA: Diagnosis not present

## 2014-09-12 DIAGNOSIS — E119 Type 2 diabetes mellitus without complications: Secondary | ICD-10-CM | POA: Diagnosis not present

## 2014-09-12 DIAGNOSIS — Z8501 Personal history of malignant neoplasm of esophagus: Secondary | ICD-10-CM | POA: Insufficient documentation

## 2014-09-12 DIAGNOSIS — N281 Cyst of kidney, acquired: Secondary | ICD-10-CM | POA: Diagnosis not present

## 2014-09-12 DIAGNOSIS — R1084 Generalized abdominal pain: Secondary | ICD-10-CM

## 2014-09-12 DIAGNOSIS — Z8619 Personal history of other infectious and parasitic diseases: Secondary | ICD-10-CM | POA: Diagnosis not present

## 2014-09-12 DIAGNOSIS — Z79899 Other long term (current) drug therapy: Secondary | ICD-10-CM | POA: Insufficient documentation

## 2014-09-12 DIAGNOSIS — Z720 Tobacco use: Secondary | ICD-10-CM | POA: Diagnosis not present

## 2014-09-12 DIAGNOSIS — K56600 Partial intestinal obstruction, unspecified as to cause: Secondary | ICD-10-CM

## 2014-09-12 DIAGNOSIS — K8020 Calculus of gallbladder without cholecystitis without obstruction: Secondary | ICD-10-CM | POA: Diagnosis not present

## 2014-09-12 DIAGNOSIS — Z86018 Personal history of other benign neoplasm: Secondary | ICD-10-CM | POA: Diagnosis not present

## 2014-09-12 DIAGNOSIS — K219 Gastro-esophageal reflux disease without esophagitis: Secondary | ICD-10-CM | POA: Diagnosis not present

## 2014-09-12 DIAGNOSIS — K598 Other specified functional intestinal disorders: Secondary | ICD-10-CM | POA: Diagnosis not present

## 2014-09-12 DIAGNOSIS — M1990 Unspecified osteoarthritis, unspecified site: Secondary | ICD-10-CM | POA: Insufficient documentation

## 2014-09-12 LAB — CBC WITH DIFFERENTIAL/PLATELET
BASOS ABS: 0 10*3/uL (ref 0.0–0.1)
Basophils Relative: 0 % (ref 0–1)
EOS PCT: 3 % (ref 0–5)
Eosinophils Absolute: 0.2 10*3/uL (ref 0.0–0.7)
HCT: 43.8 % (ref 39.0–52.0)
Hemoglobin: 15 g/dL (ref 13.0–17.0)
LYMPHS PCT: 33 % (ref 12–46)
Lymphs Abs: 2.4 10*3/uL (ref 0.7–4.0)
MCH: 32.1 pg (ref 26.0–34.0)
MCHC: 34.2 g/dL (ref 30.0–36.0)
MCV: 93.6 fL (ref 78.0–100.0)
MONOS PCT: 11 % (ref 3–12)
Monocytes Absolute: 0.8 10*3/uL (ref 0.1–1.0)
Neutro Abs: 3.9 10*3/uL (ref 1.7–7.7)
Neutrophils Relative %: 53 % (ref 43–77)
PLATELETS: 241 10*3/uL (ref 150–400)
RBC: 4.68 MIL/uL (ref 4.22–5.81)
RDW: 12.5 % (ref 11.5–15.5)
WBC: 7.3 10*3/uL (ref 4.0–10.5)

## 2014-09-12 LAB — COMPREHENSIVE METABOLIC PANEL
ALK PHOS: 64 U/L (ref 38–126)
ALT: 18 U/L (ref 17–63)
ANION GAP: 10 (ref 5–15)
AST: 21 U/L (ref 15–41)
Albumin: 4.1 g/dL (ref 3.5–5.0)
BUN: 14 mg/dL (ref 6–20)
CO2: 25 mmol/L (ref 22–32)
Calcium: 9 mg/dL (ref 8.9–10.3)
Chloride: 102 mmol/L (ref 101–111)
Creatinine, Ser: 1.32 mg/dL — ABNORMAL HIGH (ref 0.61–1.24)
GFR calc Af Amer: 58 mL/min — ABNORMAL LOW (ref >60)
GFR, EST NON AFRICAN AMERICAN: 50 mL/min — AB (ref >60)
Glucose, Bld: 101 mg/dL — ABNORMAL HIGH (ref 70–99)
POTASSIUM: 4.1 mmol/L (ref 3.5–5.1)
Sodium: 137 mmol/L (ref 135–145)
Total Bilirubin: 0.7 mg/dL (ref 0.3–1.2)
Total Protein: 7 g/dL (ref 6.5–8.1)

## 2014-09-12 LAB — POCT URINALYSIS DIP (DEVICE)
GLUCOSE, UA: NEGATIVE mg/dL
KETONES UR: NEGATIVE mg/dL
Leukocytes, UA: NEGATIVE
Nitrite: NEGATIVE
Protein, ur: 30 mg/dL — AB
SPECIFIC GRAVITY, URINE: 1.02 (ref 1.005–1.030)
Urobilinogen, UA: 0.2 mg/dL (ref 0.0–1.0)
pH: 6 (ref 5.0–8.0)

## 2014-09-12 LAB — URINE MICROSCOPIC-ADD ON

## 2014-09-12 LAB — URINALYSIS, ROUTINE W REFLEX MICROSCOPIC
Bilirubin Urine: NEGATIVE
GLUCOSE, UA: NEGATIVE mg/dL
Ketones, ur: NEGATIVE mg/dL
LEUKOCYTES UA: NEGATIVE
Nitrite: NEGATIVE
PROTEIN: NEGATIVE mg/dL
SPECIFIC GRAVITY, URINE: 1.012 (ref 1.005–1.030)
Urobilinogen, UA: 0.2 mg/dL (ref 0.0–1.0)
pH: 5.5 (ref 5.0–8.0)

## 2014-09-12 LAB — POCT I-STAT, CHEM 8
BUN: 15 mg/dL (ref 6–20)
CREATININE: 1.2 mg/dL (ref 0.61–1.24)
Calcium, Ion: 1.17 mmol/L (ref 1.13–1.30)
Chloride: 100 mmol/L — ABNORMAL LOW (ref 101–111)
Glucose, Bld: 109 mg/dL — ABNORMAL HIGH (ref 70–99)
HCT: 46 % (ref 39.0–52.0)
Hemoglobin: 15.6 g/dL (ref 13.0–17.0)
Potassium: 3.8 mmol/L (ref 3.5–5.1)
Sodium: 136 mmol/L (ref 135–145)
TCO2: 23 mmol/L (ref 0–100)

## 2014-09-12 LAB — I-STAT CG4 LACTIC ACID, ED: LACTIC ACID, VENOUS: 0.78 mmol/L (ref 0.5–2.0)

## 2014-09-12 MED ORDER — IOHEXOL 300 MG/ML  SOLN
100.0000 mL | Freq: Once | INTRAMUSCULAR | Status: AC | PRN
Start: 2014-09-12 — End: 2014-09-12
  Administered 2014-09-12: 100 mL via INTRAVENOUS

## 2014-09-12 MED ORDER — IOHEXOL 300 MG/ML  SOLN: 25.0000 mL | Freq: Once | INTRAMUSCULAR | Status: DC | PRN

## 2014-09-12 NOTE — ED Notes (Signed)
C/o intermittent abd pain onset 2 days w/diarrhea Denies urinary sx, f/n/v Alert, no signs of acute distress.

## 2014-09-12 NOTE — ED Provider Notes (Signed)
CSN: [id number]     Arrival date & time 09/12/14  1022 History   First MD Initiated Contact with Patient 09/12/14 1258     Chief Complaint  Patient presents with  . Abdominal Pain   (Consider location/radiation/quality/duration/timing/severity/associated sxs/prior Treatment) HPI Comments: 81 year old male complaining of diarrhea for 2 days. It is associated with a sore, bloating stomach. Yesterday he admits to 7 episodes of diarrhea stools and today so far 2. Denies nausea, vomiting, fever, shortness of breath or chest pain. He states his abdomen felt swollen and bloated while he was waiting to be seen in the lobby and he took one of his hydrocodone and now it feels better. He has a long vertical midline scar to the abdomen where he states he has had 2 surgeries for esophageal cancer.   Past Medical History  Diagnosis Date  . Hypertension   . Acid reflux   . Hyperlipidemia   . Esophageal cancer     tx'd surgery - abdominal & right posterior chest approach (2001) - [place], [place]  . Diabetes mellitus     controlled with diet and exercise  . Gout   . ED (erectile dysfunction)   . Lipoma   . Colon polyps   . Tinea versicolor   . Osteoarthritis   . BPH (benign prostatic hyperplasia)   . Tinnitus   . DDD (degenerative disc disease)   . First degree AV block   . Hemorrhoids   . Obesity   . Barrett's esophagus   . History of palpitations    Past Surgical History  Procedure Laterality Date  . Gastric resection    . Esophageal cancer surgery  2000, 2001  . Bilateral olecranon bursal excisions    . Hemorrhoid surgery    . Vasectomy    . Transthoracic echocardiogram  2011    EF=>55%, mild mitral annular calcif, mild calcif of MV apparatus, mild TR, normal RSVP, mild AV regurg, aortic root sclerosis/calcifiication  . Nm myocar perf wall motion  2011    dipyridamole myoview - stress images show medium in size, moderate in intensity perfusion defect in basal inferior & mid inferior  walls with mild defect reversibility at rest, EF 64%   Family History  Problem Relation Age of Onset  . Diabetes Mother   . CAD Mother   . Stroke Sister   . Colon cancer Neg Hx   . Esophageal cancer Neg Hx   . Rectal cancer Neg Hx   . Stomach cancer Neg Hx    History  Substance Use Topics  . Smoking status: Current Some Day Smoker -- 50 years    Types: Cigars, Pipe  . Smokeless tobacco: Never Used     Comment: 2-3x/daily, sometimes not at all , tobacco info given 08/01/13  . Alcohol Use: 3.5 - 7.0 oz/week    7-14 drink(s) per week    Review of Systems  Constitutional: Positive for activity change. Negative for fever and fatigue.  Respiratory: Negative.   Cardiovascular: Negative for chest pain.  Gastrointestinal: Positive for abdominal pain, diarrhea and abdominal distention. Negative for nausea, vomiting and constipation.  Genitourinary: Negative.   Musculoskeletal: Negative.   Skin: Negative.   Neurological: Negative.     Allergies  Bee venom  Home Medications   Prior to Admission medications   Medication Sig Start Date End Date Taking? Authorizing Provider  aspirin 81 MG tablet Take 81 mg by mouth daily.   Yes Historical Provider, MD  losartan-hydrochlorothiazide (HYZAAR) 100-25 MG per  tablet Take 0.5 tablets by mouth daily. 07/09/13  Yes Pixie Casino, MD  omeprazole (PRILOSEC) 20 MG capsule Take 20 mg by mouth 2 (two) times daily before a meal.    Yes Historical Provider, MD  atenolol (TENORMIN) 25 MG tablet Take 1 tablet (25 mg total) by mouth daily. 07/09/13   Pixie Casino, MD  HYDROcodone-acetaminophen (NORCO/VICODIN) 5-325 MG per tablet Take 1 tablet by mouth every 6 (six) hours as needed for moderate pain.    Historical Provider, MD  Multiple Vitamin (MULTIVITAMIN) tablet Take 1 tablet by mouth daily.    Historical Provider, MD  Omega-3 Fatty Acids (FISH OIL) 1000 MG CAPS Take 1 capsule by mouth daily.    Historical Provider, MD  Saw Palmetto, Serenoa repens,  (SAW PALMETTO PO) Take 2 capsules by mouth daily.    Historical Provider, MD  vitamin B-12 (CYANOCOBALAMIN) 1000 MCG tablet Take 1,000 mcg by mouth daily.    Historical Provider, MD   BP 136/64 mmHg  Pulse 69  Temp(Src) 97.9 F (36.6 C) (Oral)  Resp 14  SpO2 98% Physical Exam  Constitutional: He is oriented to person, place, and time. He appears well-developed and well-nourished. No distress.  Neck: Normal range of motion. Neck supple.  Cardiovascular: Normal rate, regular rhythm and normal heart sounds.   Pulmonary/Chest: Effort normal and breath sounds normal. No respiratory distress. He has no wheezes. He has no rales.  Abdominal: Soft. Bowel sounds are normal. He exhibits no distension. There is no rebound and no guarding.  Mild generalized tenderness  Lymphadenopathy:    He has no cervical adenopathy.  Neurological: He is alert and oriented to person, place, and time.  Skin: Skin is warm and dry.  Nursing note and vitals reviewed.   ED Course  Procedures (including critical care time) Labs Review Labs Reviewed  POCT URINALYSIS DIP (DEVICE) - Abnormal; Notable for the following:    Bilirubin Urine SMALL (*)    Hgb urine dipstick MODERATE (*)    Protein, ur 30 (*)    All other components within normal limits  POCT I-STAT, CHEM 8 - Abnormal; Notable for the following:    Chloride 100 (*)    Glucose, Bld 109 (*)    All other components within normal limits   Results for orders placed or performed during the hospital encounter of 09/12/14  POCT urinalysis dip (device)  Result Value Ref Range   Glucose, UA NEGATIVE NEGATIVE mg/dL   Bilirubin Urine SMALL (A) NEGATIVE   Ketones, ur NEGATIVE NEGATIVE mg/dL   Specific Gravity, Urine 1.020 1.005 - 1.030   Hgb urine dipstick MODERATE (A) NEGATIVE   pH 6.0 5.0 - 8.0   Protein, ur 30 (A) NEGATIVE mg/dL   Urobilinogen, UA 0.2 0.0 - 1.0 mg/dL   Nitrite NEGATIVE NEGATIVE   Leukocytes, UA NEGATIVE NEGATIVE  I-STAT, chem 8   Result Value Ref Range   Sodium 136 135 - 145 mmol/L   Potassium 3.8 3.5 - 5.1 mmol/L   Chloride 100 (L) 101 - 111 mmol/L   BUN 15 6 - 20 mg/dL   Creatinine, Ser 1.20 0.61 - 1.24 mg/dL   Glucose, Bld 109 (H) 70 - 99 mg/dL   Calcium, Ion 1.17 1.13 - 1.30 mmol/L   TCO2 23 0 - 100 mmol/L   Hemoglobin 15.6 13.0 - 17.0 g/dL   HCT 46.0 39.0 - 52.0 %     Imaging Review Dg Abd 2 Views  09/12/2014   CLINICAL DATA:  Per  pt: pain in stomach that is intermittent onset 2 days with a lot of diarrhea, no vomiting or nausea. Patient had esophogeal cancer 2000 & 2001. Smoker of a pipe. Patient is a diabetic.  EXAM: ABDOMEN - 2 VIEW  COMPARISON:  CT, 07/30/2010.  FINDINGS: There is mildly dilated small bowel most evident in the left upper quadrant, with multilevel air-fluid levels on the erect view. Some air is seen within a nondistended colon. There is no free air.  There multiple surgical clips in the left upper quadrant.  No evidence of renal or ureteral stones.  Degenerative changes are noted of the visualized spine. Bones are demineralized.  IMPRESSION: 1. Mild small bowel dilation with air-fluid levels, without colonic dilation. This suggests a low grade partial small bowel obstruction. No free air.   Electronically Signed   By: Lajean Manes M.D.   On: 09/12/2014 14:04     MDM   1. Generalized abdominal pain   2. Diarrhea   3. Partial small bowel obstruction    Transfer to [facility] ED via shuttle for evaluation of abdominal pain and partial small bowel obstruction.   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 It is been explained to him that he may get seriously worse or have a very poor outcome if he does not go now. He states that once he takes care of his business that he will go directly to the emergency department. In the meantime he will sign an AMA in regards to  leaving early and not going to the ER.  Janne Napoleon, NP 09/12/14 1429

## 2014-09-12 NOTE — ED Provider Notes (Signed)
Plan of diffuse crampy abdominal pain. Patient also had 8 episodes of diarrhea over the past 2 days. Presently asymptomatic. He denies any abdominal pain. States he is hungry. No lightheadedness. On exam well-appearing. Abdomen nondistended, normoactive bowel sounds nontender  Orlie Dakin, MD 09/12/14 1714

## 2014-09-12 NOTE — ED Notes (Signed)
He was sent from [facility] for further evaluation of SBO on abd xray there today. He reports 2 days history of abd pain and diarrhea

## 2014-09-12 NOTE — ED Notes (Signed)
Pt here from [facility], seen today for abdominal pain and cramping.  Pt has xray and urinalysis, and dx with partial bowel obstruction.

## 2014-09-12 NOTE — ED Notes (Signed)
Pt sts he took Hydrocodone for pain approx 10am.  Sts he also tried two GasX yesterday, without relief of diarrhea.  Pt sts pain is intermittent, but intense, and typically followed by a BM.

## 2014-09-12 NOTE — ED Notes (Signed)
Attempted to call CT for update on pt status.

## 2014-09-12 NOTE — ED Provider Notes (Signed)
CSN: [id number]     Arrival date & time 09/12/14  1452 History   First MD Initiated Contact with Patient 09/12/14 1615     Chief Complaint  Patient presents with  . Abdominal Pain     (Consider location/radiation/quality/duration/timing/severity/associated sxs/prior Treatment) Patient is a 81 y.o. male presenting with abdominal pain. The history is provided by the patient.  Abdominal Pain Pain location:  Generalized Pain quality: cramping   Pain radiates to:  Does not radiate Pain severity:  Mild Onset quality:  Gradual Duration:  2 days Timing:  Constant Progression:  Unchanged Chronicity:  New Context: not recent illness, not recent travel, not sick contacts, not suspicious food intake and not trauma   Relieved by:  Nothing Worsened by:  Nothing tried Ineffective treatments:  None tried Associated symptoms: diarrhea   Associated symptoms: no anorexia, no chest pain, no chills, no constipation, no cough, no dysuria, no fatigue, no fever, no flatus, no hematemesis, no hematochezia, no hematuria, no melena, no nausea, no shortness of breath and no vomiting   Diarrhea:    Quality:  Watery   Number of occurrences:  8   Severity:  Moderate   Duration:  2 days   Timing:  Constant   Progression:  Unchanged   Past Medical History  Diagnosis Date  . Hypertension   . Acid reflux   . Hyperlipidemia   . Esophageal cancer     tx'd surgery - abdominal & right posterior chest approach (2001) - [place], [place]  . Diabetes mellitus     controlled with diet and exercise  . Gout   . ED (erectile dysfunction)   . Lipoma   . Colon polyps   . Tinea versicolor   . Osteoarthritis   . BPH (benign prostatic hyperplasia)   . Tinnitus   . DDD (degenerative disc disease)   . First degree AV block   . Hemorrhoids   . Obesity   . Barrett's esophagus   . History of palpitations    Past Surgical History  Procedure Laterality Date  . Gastric resection    . Esophageal cancer surgery  2000,  2001  . Bilateral olecranon bursal excisions    . Hemorrhoid surgery    . Vasectomy    . Transthoracic echocardiogram  2011    EF=>55%, mild mitral annular calcif, mild calcif of MV apparatus, mild TR, normal RSVP, mild AV regurg, aortic root sclerosis/calcifiication  . Nm myocar perf wall motion  2011    dipyridamole myoview - stress images show medium in size, moderate in intensity perfusion defect in basal inferior & mid inferior walls with mild defect reversibility at rest, EF 64%   Family History  Problem Relation Age of Onset  . Diabetes Mother   . CAD Mother   . Stroke Sister   . Colon cancer Neg Hx   . Esophageal cancer Neg Hx   . Rectal cancer Neg Hx   . Stomach cancer Neg Hx    History  Substance Use Topics  . Smoking status: Current Some Day Smoker -- 50 years    Types: Cigars, Pipe  . Smokeless tobacco: Never Used     Comment: 2-3x/daily, sometimes not at all , tobacco info given 08/01/13  . Alcohol Use: 3.5 - 7.0 oz/week    7-14 drink(s) per week    Review of Systems  Constitutional: Negative for fever, chills, diaphoresis, appetite change and fatigue.  Respiratory: Negative for cough, chest tightness and shortness of breath.  Cardiovascular: Negative for chest pain, palpitations and leg swelling.  Gastrointestinal: Positive for abdominal pain and diarrhea. Negative for nausea, vomiting, constipation, blood in stool, melena, hematochezia, abdominal distention, anorexia, flatus and hematemesis.  Genitourinary: Negative for dysuria, hematuria, flank pain and difficulty urinating.  Musculoskeletal: Negative for back pain, neck pain and neck stiffness.  Skin: Negative for color change, pallor and rash.  Neurological: Negative for dizziness, light-headedness and headaches.  All other systems reviewed and are negative.     Allergies  Bee venom  Home Medications   Prior to Admission medications   Medication Sig Start Date End Date Taking? Authorizing Provider   aspirin 81 MG tablet Take 81 mg by mouth daily.   Yes Historical Provider, MD  atenolol (TENORMIN) 25 MG tablet Take 1 tablet (25 mg total) by mouth daily. 07/09/13  Yes Pixie Casino, MD  HYDROcodone-acetaminophen (NORCO/VICODIN) 5-325 MG per tablet Take 1 tablet by mouth every 6 (six) hours as needed for moderate pain.   Yes Historical Provider, MD  losartan-hydrochlorothiazide (HYZAAR) 100-12.5 MG per tablet Take 0.5 tablets by mouth every morning. 08/05/14  Yes Historical Provider, MD  Multiple Vitamin (MULTIVITAMIN) tablet Take 1 tablet by mouth daily.   Yes Historical Provider, MD  Omega-3 Fatty Acids (FISH OIL) 1000 MG CAPS Take 1 capsule by mouth daily.   Yes Historical Provider, MD  omeprazole (PRILOSEC) 20 MG capsule Take 40 mg by mouth 2 (two) times daily before a meal.    Yes Historical Provider, MD  Saw Palmetto, Serenoa repens, (SAW PALMETTO PO) Take 2 capsules by mouth daily.   Yes Historical Provider, MD  vitamin B-12 (CYANOCOBALAMIN) 1000 MCG tablet Take 1,000 mcg by mouth daily.   Yes Historical Provider, MD  losartan-hydrochlorothiazide (HYZAAR) 100-25 MG per tablet Take 0.5 tablets by mouth daily. Patient not taking: Reported on 09/12/2014 07/09/13   Pixie Casino, MD   BP 141/65 mmHg  Pulse 62  Temp(Src)   Resp 18  SpO2 97% Physical Exam  Constitutional: He is oriented to person, place, and time. He appears well-developed and well-nourished. No distress.  HENT:  Head: Normocephalic and atraumatic.  Mouth/Throat: Oropharynx is clear and moist.  Eyes: Conjunctivae and EOM are normal. Pupils are equal, round, and reactive to light.  Neck: Normal range of motion. Neck supple.  Cardiovascular: Normal rate, regular rhythm, normal heart sounds and intact distal pulses.  Exam reveals no gallop and no friction rub.   No murmur heard. Pulmonary/Chest: Effort normal and breath sounds normal. No respiratory distress. He has no wheezes. He has no rales.  Abdominal: Soft. Normal  appearance and bowel sounds are normal. He exhibits no distension. There is generalized tenderness. There is no rigidity, no rebound, no guarding, no tenderness at McBurney's point and negative Murphy's sign.  Musculoskeletal: Normal range of motion. He exhibits no edema or tenderness.  Neurological: He is alert and oriented to person, place, and time.  Skin: Skin is warm and dry. No rash noted. He is not diaphoretic. No erythema. No pallor.  Nursing note and vitals reviewed.   ED Course  Procedures (including critical care time) Labs Review Labs Reviewed  COMPREHENSIVE METABOLIC PANEL - Abnormal; Notable for the following:    Glucose, Bld 101 (*)    Creatinine, Ser 1.32 (*)    GFR calc non Af Amer 50 (*)    GFR calc Af Amer 58 (*)    All other components within normal limits  URINALYSIS, ROUTINE W REFLEX MICROSCOPIC - Abnormal; Notable  for the following:    Color, Urine AMBER (*)    Hgb urine dipstick MODERATE (*)    All other components within normal limits  CBC WITH DIFFERENTIAL/PLATELET  URINE MICROSCOPIC-ADD ON  I-STAT CG4 LACTIC ACID, ED    Imaging Review Ct Abdomen Pelvis W Contrast  09/12/2014   CLINICAL DATA:  81 year old male complaining of diarrhea for 2 days. It is associated with a sore, bloating stomach. Yesterday he admits to 7 episodes of diarrhea stools and today so far 2. Denies nausea, vomiting, fever, shortness of breath or chest pain. He states his abdomen felt swollen and bloated while he was waiting to be seen in the lobby and he took one of his hydrocodone and now it feels better.He has a long vertical midline scar to the abdomen where he states he has had 2 surgeries for esophageal cancer  EXAM: CT ABDOMEN AND PELVIS WITH CONTRAST  TECHNIQUE: Multidetector CT imaging of the abdomen and pelvis was performed using the standard protocol following bolus administration of intravenous contrast.  CONTRAST:  158mL OMNIPAQUE IOHEXOL 300 MG/ML  SOLN  COMPARISON:  07/30/2010   FINDINGS: There has been a gastric pull-up procedure for esophageal carcinoma. The stomach p.m. the heart is distended filled with contrast with some air. Lung bases show linear and reticular opacities consistent with combination of subsegmental atelectasis and scarring. Heart is borderline enlarged.  Liver and spleen are unremarkable.  Gallbladder is distended. There are dependent gallstones. No wall thickening. Common bile duct is normal in caliber for age. No evidence of a duct stone. Normal pancreas.  Normal adrenal glands. Two small cysts arise from the left kidney, an 11 mm cyst in the posterior midpole and a 12 mm cyst along the medial lower pole. No other renal masses, no stones and no hydronephrosis. Normal ureters. Bladder is unremarkable.  Prostate gland is enlarged.  Atherosclerotic changes are noted throughout a normal caliber abdominal aorta extending into its branch vessels.  No adenopathy.  No abnormal fluid collections.  There are mildly prominent loops of small bowel in the central to low abdomen. There is mild twisting of small bowel mesentery without over volvulus. Dependent fluid is seen within the right colon. There multiple colonic diverticula. There is no colon or small bowel wall thickening or mesenteric inflammation. Normal appendix is visualized.  Degenerative changes of the visualized spine, mostly the lower low lumbar spine, predominantly facet degenerative change. No osteoblastic or osteolytic lesions.  IMPRESSION: 1. Mild prominence of small bowel without a discrete transition point. There is no convincing bowel obstruction. There are air-fluid levels within the small bowel and right colon. Mild adynamic ileus or gastroenteritis is suspected. 2. No other evidence of an acute abnormality. 3. Changes from a gastric pull-up procedure, stable from the prior CT. 4. Gallstones.  No acute cholecystitis. 5. Small left renal cysts. 6. Prostatic hypertrophy. 7. Atherosclerotic changes along the  aorta. Degenerative changes noted of the visualized spine.   Electronically Signed   By: Lajean Manes M.D.   On: 09/12/2014 18:57   Dg Abd 2 Views  09/12/2014   CLINICAL DATA:  Per pt: pain in stomach that is intermittent onset 2 days with a lot of diarrhea, no vomiting or nausea. Patient had esophogeal cancer 2000 & 2001. Smoker of a pipe. Patient is a diabetic.  EXAM: ABDOMEN - 2 VIEW  COMPARISON:  CT, 07/30/2010.  FINDINGS: There is mildly dilated small bowel most evident in the left upper quadrant, with multilevel air-fluid levels on  the erect view. Some air is seen within a nondistended colon. There is no free air.  There multiple surgical clips in the left upper quadrant.  No evidence of renal or ureteral stones.  Degenerative changes are noted of the visualized spine. Bones are demineralized.  IMPRESSION: 1. Mild small bowel dilation with air-fluid levels, without colonic dilation. This suggests a low grade partial small bowel obstruction. No free air.   Electronically Signed   By: Lajean Manes M.D.   On: 09/12/2014 14:04     EKG Interpretation None      MDM   Final diagnoses:  Gastroenteritis    81 yo M with PMH of esophageal CA s/p esophageal surgery, HTN, aortic insufficiency, presenting from urgent care with concern for partial SBO.  Pt reports 2 days of watery diarrhea, about 7-8 episodes per day.  Reports abdominal bloating with intermittent pain/cramping which is relieved after he feels a "bubble" burst.  He has been passing flatus.  Denies nausea/vomiting, dysphagia, fevers, dysuria.  No hematochezia or melena.  Workup at urgent care with XR abdomen showing possible partial SBO.   On presentation, pt alert, VSS, well appearing in NAD. CV and lung exam WNL.  Abdomen soft, mildly distended, reports mild generalized discomfort with palpation but no tenderness, rebound, guarding. Positive bowel sounds. Midline abdominal scar well healed. No other acute findings.  Plan for CT A/P to r/o  SBO.  CT shows evidence of ileus vs gastroenteritis however feel that clinically this is more representative of gastroenteritis as pt currently reports feeling well and actually hungry, has active bowel sounds, and benign abdomen.  Stable for d/c.  Advised close f/u with PCP.  ED return precautions given.  No other concerns.  Discussed with attending Dr. Winfred Leeds.    Ellwood Dense, MD 09/13/14 0037  Orlie Dakin, MD 09/14/14 [phone]

## 2014-09-12 NOTE — Discharge Instructions (Signed)
Abdominal Pain Many things can cause abdominal pain. Usually, abdominal pain is not caused by a disease and will improve without treatment. It can often be observed and treated at home. Your health care provider will do a physical exam and possibly order blood tests and X-rays to help determine the seriousness of your pain. However, in many cases, more time must pass before a clear cause of the pain can be found. Before that point, your health care provider may not know if you need more testing or further treatment. HOME CARE INSTRUCTIONS  Monitor your abdominal pain for any changes. The following actions may help to alleviate any discomfort you are experiencing:  Only take over-the-counter or prescription medicines as directed by your health care provider.  Do not take laxatives unless directed to do so by your health care provider.  Try a clear liquid diet (broth, tea, or water) as directed by your health care provider. Slowly move to a bland diet as tolerated. SEEK MEDICAL CARE IF:  You have unexplained abdominal pain.  You have abdominal pain associated with nausea or diarrhea.  You have pain when you urinate or have a bowel movement.  You experience abdominal pain that wakes you in the night.  You have abdominal pain that is worsened or improved by eating food.  You have abdominal pain that is worsened with eating fatty foods.  You have a fever. SEEK IMMEDIATE MEDICAL CARE IF:   Your pain does not go away within 2 hours.  You keep throwing up (vomiting).  Your pain is felt only in portions of the abdomen, such as the right side or the left lower portion of the abdomen.  You pass bloody or black tarry stools. MAKE SURE YOU:  Understand these instructions.   Will watch your condition.   Will get help right away if you are not doing well or get worse.  Document Released: 02/02/2005 Document Revised: 04/30/2013 Document Reviewed: 01/02/2013 [facility] Patient Information  2015 [place], [place]. This information is not intended to replace advice given to you by your health care provider. Make sure you discuss any questions you have with your health care provider.  Small Bowel Obstruction A small bowel obstruction is a blockage (obstruction) of the small intestine (small bowel). The small bowel is a long, slender tube that connects the stomach to the colon. Its job is to absorb nutrients from the fluids and foods you consume into the bloodstream.  CAUSES  There are many causes of intestinal blockage. The most common ones include:  Hernias. This is a more common cause in children than adults.  Inflammatory bowel disease (enteritis and colitis).  Twisting of the bowel (volvulus).  Tumors.  Scar tissue (adhesions) from previous surgery or radiation treatment.  Recent surgery. This may cause an acute small bowel obstruction called an ileus. SYMPTOMS   Abdominal pain. This may be dull cramps or sharp pain. It may occur in one area or may be present in the entire abdomen. Pain can range from mild to severe, depending on the degree of obstruction.  Nausea and vomiting. Vomit may be greenish or yellow bile color.  Distended or swollen stomach. Abdominal bloating is a common symptom.  Constipation.  Lack of passing gas.  Frequent belching.  Diarrhea. This may occur if runny stool is able to leak around the obstruction. DIAGNOSIS  Your caregiver can usually diagnose small bowel obstruction by taking a history, doing a physical exam, and taking X-rays. If the cause is unclear, a  CT scan (computerized tomography) of your abdomen and pelvis may be needed. TREATMENT  Treatment of the blockage depends on the cause and how bad the problem is.   Sometimes, the obstruction improves with bed rest and intravenous (IV) fluids.  Resting the bowel is very important. This means following a simple diet. Sometimes, a clear liquid diet may be required for several  days.  Sometimes, a small tube (nasogastric tube) is placed into the stomach to decompress the bowel. When the bowel is blocked, it usually swells up like a balloon filled with air and fluids. Decompression means that the air and fluids are removed by suction through that tube. This can help with pain, discomfort, and nausea. It can also help the obstruction resolve faster.  Surgery may be required if other treatments do not work. Bowel obstruction from a hernia may require early surgery and can be an emergency procedure. Adhesions that cause frequent or severe obstructions may also require surgery. HOME CARE INSTRUCTIONS If your bowel obstruction is only partial or incomplete, you may be allowed to go home.  Get plenty of rest.  Follow your diet as directed by your caregiver.  Only consume clear liquids until your condition improves.  Avoid solid foods as instructed. SEEK IMMEDIATE MEDICAL CARE IF:  You have increased pain or cramping.  You vomit blood.  You have uncontrolled vomiting or nausea.  You cannot drink fluids due to vomiting or pain.  You develop confusion.  You begin feeling very dry or thirsty (dehydrated).  You have severe bloating.  You have chills.  You have a fever.  You feel extremely weak or you faint. MAKE SURE YOU:  Understand these instructions.  Will watch your condition.  Will get help right away if you are not doing well or get worse. Document Released: 07/12/2005 Document Revised: 07/18/2011 Document Reviewed: 07/09/2010 [facility] Patient Information 2015 [place], [place]. This information is not intended to replace advice given to you by your health care provider. Make sure you discuss any questions you have with your health care provider.

## 2014-09-18 DIAGNOSIS — K5669 Other intestinal obstruction: Secondary | ICD-10-CM | POA: Diagnosis not present

## 2015-01-21 DIAGNOSIS — H9193 Unspecified hearing loss, bilateral: Secondary | ICD-10-CM | POA: Diagnosis not present

## 2015-01-21 DIAGNOSIS — E663 Overweight: Secondary | ICD-10-CM | POA: Diagnosis not present

## 2015-01-21 DIAGNOSIS — Z23 Encounter for immunization: Secondary | ICD-10-CM | POA: Diagnosis not present

## 2015-01-21 DIAGNOSIS — N4289 Other specified disorders of prostate: Secondary | ICD-10-CM | POA: Diagnosis not present

## 2015-01-21 DIAGNOSIS — H9313 Tinnitus, bilateral: Secondary | ICD-10-CM | POA: Diagnosis not present

## 2015-02-06 DIAGNOSIS — M9904 Segmental and somatic dysfunction of sacral region: Secondary | ICD-10-CM | POA: Diagnosis not present

## 2015-02-06 DIAGNOSIS — M9905 Segmental and somatic dysfunction of pelvic region: Secondary | ICD-10-CM | POA: Diagnosis not present

## 2015-02-06 DIAGNOSIS — M5136 Other intervertebral disc degeneration, lumbar region: Secondary | ICD-10-CM | POA: Diagnosis not present

## 2015-02-06 DIAGNOSIS — M9903 Segmental and somatic dysfunction of lumbar region: Secondary | ICD-10-CM | POA: Diagnosis not present

## 2015-03-23 DIAGNOSIS — M9904 Segmental and somatic dysfunction of sacral region: Secondary | ICD-10-CM | POA: Diagnosis not present

## 2015-03-23 DIAGNOSIS — M5136 Other intervertebral disc degeneration, lumbar region: Secondary | ICD-10-CM | POA: Diagnosis not present

## 2015-03-23 DIAGNOSIS — M9905 Segmental and somatic dysfunction of pelvic region: Secondary | ICD-10-CM | POA: Diagnosis not present

## 2015-03-23 DIAGNOSIS — M9903 Segmental and somatic dysfunction of lumbar region: Secondary | ICD-10-CM | POA: Diagnosis not present

## 2015-03-25 DIAGNOSIS — M9904 Segmental and somatic dysfunction of sacral region: Secondary | ICD-10-CM | POA: Diagnosis not present

## 2015-03-25 DIAGNOSIS — M9905 Segmental and somatic dysfunction of pelvic region: Secondary | ICD-10-CM | POA: Diagnosis not present

## 2015-03-25 DIAGNOSIS — M9903 Segmental and somatic dysfunction of lumbar region: Secondary | ICD-10-CM | POA: Diagnosis not present

## 2015-03-25 DIAGNOSIS — M5136 Other intervertebral disc degeneration, lumbar region: Secondary | ICD-10-CM | POA: Diagnosis not present

## 2015-05-20 DIAGNOSIS — H40013 Open angle with borderline findings, low risk, bilateral: Secondary | ICD-10-CM | POA: Diagnosis not present

## 2015-05-20 DIAGNOSIS — H25812 Combined forms of age-related cataract, left eye: Secondary | ICD-10-CM | POA: Diagnosis not present

## 2015-05-20 DIAGNOSIS — Z961 Presence of intraocular lens: Secondary | ICD-10-CM | POA: Diagnosis not present

## 2015-05-20 DIAGNOSIS — E119 Type 2 diabetes mellitus without complications: Secondary | ICD-10-CM | POA: Diagnosis not present

## 2015-05-20 DIAGNOSIS — H04123 Dry eye syndrome of bilateral lacrimal glands: Secondary | ICD-10-CM | POA: Diagnosis not present

## 2015-07-07 DIAGNOSIS — M9905 Segmental and somatic dysfunction of pelvic region: Secondary | ICD-10-CM | POA: Diagnosis not present

## 2015-07-07 DIAGNOSIS — M9903 Segmental and somatic dysfunction of lumbar region: Secondary | ICD-10-CM | POA: Diagnosis not present

## 2015-07-07 DIAGNOSIS — M5136 Other intervertebral disc degeneration, lumbar region: Secondary | ICD-10-CM | POA: Diagnosis not present

## 2015-07-07 DIAGNOSIS — M9904 Segmental and somatic dysfunction of sacral region: Secondary | ICD-10-CM | POA: Diagnosis not present

## 2015-07-14 DIAGNOSIS — Z125 Encounter for screening for malignant neoplasm of prostate: Secondary | ICD-10-CM | POA: Diagnosis not present

## 2015-07-14 DIAGNOSIS — I10 Essential (primary) hypertension: Secondary | ICD-10-CM | POA: Diagnosis not present

## 2015-07-14 DIAGNOSIS — Z0000 Encounter for general adult medical examination without abnormal findings: Secondary | ICD-10-CM | POA: Diagnosis not present

## 2015-07-14 DIAGNOSIS — E7800 Pure hypercholesterolemia, unspecified: Secondary | ICD-10-CM | POA: Diagnosis not present

## 2015-07-17 DIAGNOSIS — M9904 Segmental and somatic dysfunction of sacral region: Secondary | ICD-10-CM | POA: Diagnosis not present

## 2015-07-17 DIAGNOSIS — M5136 Other intervertebral disc degeneration, lumbar region: Secondary | ICD-10-CM | POA: Diagnosis not present

## 2015-07-17 DIAGNOSIS — M9903 Segmental and somatic dysfunction of lumbar region: Secondary | ICD-10-CM | POA: Diagnosis not present

## 2015-07-17 DIAGNOSIS — M9905 Segmental and somatic dysfunction of pelvic region: Secondary | ICD-10-CM | POA: Diagnosis not present

## 2015-07-20 DIAGNOSIS — Z1212 Encounter for screening for malignant neoplasm of rectum: Secondary | ICD-10-CM | POA: Diagnosis not present

## 2015-07-20 DIAGNOSIS — G4733 Obstructive sleep apnea (adult) (pediatric): Secondary | ICD-10-CM | POA: Diagnosis not present

## 2015-07-20 DIAGNOSIS — Z0000 Encounter for general adult medical examination without abnormal findings: Secondary | ICD-10-CM | POA: Diagnosis not present

## 2015-07-20 DIAGNOSIS — E119 Type 2 diabetes mellitus without complications: Secondary | ICD-10-CM | POA: Diagnosis not present

## 2015-07-20 DIAGNOSIS — E7800 Pure hypercholesterolemia, unspecified: Secondary | ICD-10-CM | POA: Diagnosis not present

## 2015-08-10 DIAGNOSIS — G4733 Obstructive sleep apnea (adult) (pediatric): Secondary | ICD-10-CM | POA: Diagnosis not present

## 2015-08-25 DIAGNOSIS — M545 Low back pain: Secondary | ICD-10-CM | POA: Diagnosis not present

## 2015-08-25 DIAGNOSIS — M25561 Pain in right knee: Secondary | ICD-10-CM | POA: Diagnosis not present

## 2015-08-28 DIAGNOSIS — M1711 Unilateral primary osteoarthritis, right knee: Secondary | ICD-10-CM | POA: Diagnosis not present

## 2015-08-28 DIAGNOSIS — M545 Low back pain: Secondary | ICD-10-CM | POA: Diagnosis not present

## 2015-08-28 DIAGNOSIS — M25562 Pain in left knee: Secondary | ICD-10-CM | POA: Diagnosis not present

## 2015-08-28 DIAGNOSIS — M25561 Pain in right knee: Secondary | ICD-10-CM | POA: Diagnosis not present

## 2015-08-28 DIAGNOSIS — M1712 Unilateral primary osteoarthritis, left knee: Secondary | ICD-10-CM | POA: Diagnosis not present

## 2015-09-21 DIAGNOSIS — E119 Type 2 diabetes mellitus without complications: Secondary | ICD-10-CM | POA: Diagnosis not present

## 2015-10-07 DIAGNOSIS — M9903 Segmental and somatic dysfunction of lumbar region: Secondary | ICD-10-CM | POA: Diagnosis not present

## 2015-10-07 DIAGNOSIS — M9904 Segmental and somatic dysfunction of sacral region: Secondary | ICD-10-CM | POA: Diagnosis not present

## 2015-10-07 DIAGNOSIS — M9905 Segmental and somatic dysfunction of pelvic region: Secondary | ICD-10-CM | POA: Diagnosis not present

## 2015-10-07 DIAGNOSIS — M5136 Other intervertebral disc degeneration, lumbar region: Secondary | ICD-10-CM | POA: Diagnosis not present

## 2015-12-07 DIAGNOSIS — M109 Gout, unspecified: Secondary | ICD-10-CM | POA: Diagnosis not present

## 2015-12-30 DIAGNOSIS — G8929 Other chronic pain: Secondary | ICD-10-CM | POA: Diagnosis not present

## 2015-12-30 DIAGNOSIS — Z23 Encounter for immunization: Secondary | ICD-10-CM | POA: Diagnosis not present

## 2015-12-30 DIAGNOSIS — M5442 Lumbago with sciatica, left side: Secondary | ICD-10-CM | POA: Diagnosis not present

## 2016-01-22 DIAGNOSIS — E119 Type 2 diabetes mellitus without complications: Secondary | ICD-10-CM | POA: Diagnosis not present

## 2016-01-22 DIAGNOSIS — I10 Essential (primary) hypertension: Secondary | ICD-10-CM | POA: Diagnosis not present

## 2016-01-22 DIAGNOSIS — M545 Low back pain: Secondary | ICD-10-CM | POA: Diagnosis not present

## 2016-03-09 DIAGNOSIS — M109 Gout, unspecified: Secondary | ICD-10-CM | POA: Diagnosis not present

## 2016-03-17 DIAGNOSIS — M5136 Other intervertebral disc degeneration, lumbar region: Secondary | ICD-10-CM | POA: Diagnosis not present

## 2016-03-17 DIAGNOSIS — M9903 Segmental and somatic dysfunction of lumbar region: Secondary | ICD-10-CM | POA: Diagnosis not present

## 2016-03-17 DIAGNOSIS — M9905 Segmental and somatic dysfunction of pelvic region: Secondary | ICD-10-CM | POA: Diagnosis not present

## 2016-03-17 DIAGNOSIS — M9904 Segmental and somatic dysfunction of sacral region: Secondary | ICD-10-CM | POA: Diagnosis not present

## 2016-05-12 DIAGNOSIS — N390 Urinary tract infection, site not specified: Secondary | ICD-10-CM | POA: Diagnosis not present

## 2016-05-12 DIAGNOSIS — N400 Enlarged prostate without lower urinary tract symptoms: Secondary | ICD-10-CM | POA: Diagnosis not present

## 2016-05-12 DIAGNOSIS — I10 Essential (primary) hypertension: Secondary | ICD-10-CM | POA: Diagnosis not present

## 2016-05-12 DIAGNOSIS — C159 Malignant neoplasm of esophagus, unspecified: Secondary | ICD-10-CM | POA: Diagnosis not present

## 2016-06-14 DIAGNOSIS — N401 Enlarged prostate with lower urinary tract symptoms: Secondary | ICD-10-CM | POA: Diagnosis not present

## 2016-06-28 DIAGNOSIS — M1990 Unspecified osteoarthritis, unspecified site: Secondary | ICD-10-CM | POA: Diagnosis not present

## 2016-06-28 DIAGNOSIS — M109 Gout, unspecified: Secondary | ICD-10-CM | POA: Diagnosis not present

## 2016-06-28 DIAGNOSIS — M545 Low back pain: Secondary | ICD-10-CM | POA: Diagnosis not present

## 2016-07-22 DIAGNOSIS — I10 Essential (primary) hypertension: Secondary | ICD-10-CM | POA: Diagnosis not present

## 2016-07-22 DIAGNOSIS — Z7982 Long term (current) use of aspirin: Secondary | ICD-10-CM | POA: Diagnosis not present

## 2016-07-22 DIAGNOSIS — Z0000 Encounter for general adult medical examination without abnormal findings: Secondary | ICD-10-CM | POA: Diagnosis not present

## 2016-07-22 DIAGNOSIS — E119 Type 2 diabetes mellitus without complications: Secondary | ICD-10-CM | POA: Diagnosis not present

## 2016-07-27 DIAGNOSIS — Z0001 Encounter for general adult medical examination with abnormal findings: Secondary | ICD-10-CM | POA: Diagnosis not present

## 2016-08-04 DIAGNOSIS — R197 Diarrhea, unspecified: Secondary | ICD-10-CM | POA: Diagnosis not present

## 2016-08-04 DIAGNOSIS — R1310 Dysphagia, unspecified: Secondary | ICD-10-CM | POA: Diagnosis not present

## 2016-08-04 DIAGNOSIS — Z8501 Personal history of malignant neoplasm of esophagus: Secondary | ICD-10-CM | POA: Diagnosis not present

## 2016-08-16 DIAGNOSIS — G4710 Hypersomnia, unspecified: Secondary | ICD-10-CM | POA: Diagnosis not present

## 2016-08-16 DIAGNOSIS — R0602 Shortness of breath: Secondary | ICD-10-CM | POA: Diagnosis not present

## 2016-08-29 DIAGNOSIS — M9902 Segmental and somatic dysfunction of thoracic region: Secondary | ICD-10-CM | POA: Diagnosis not present

## 2016-08-29 DIAGNOSIS — M9903 Segmental and somatic dysfunction of lumbar region: Secondary | ICD-10-CM | POA: Diagnosis not present

## 2016-08-29 DIAGNOSIS — M5136 Other intervertebral disc degeneration, lumbar region: Secondary | ICD-10-CM | POA: Diagnosis not present

## 2016-08-29 DIAGNOSIS — M5134 Other intervertebral disc degeneration, thoracic region: Secondary | ICD-10-CM | POA: Diagnosis not present

## 2016-09-09 DIAGNOSIS — R0902 Hypoxemia: Secondary | ICD-10-CM | POA: Diagnosis not present

## 2016-09-09 DIAGNOSIS — G4733 Obstructive sleep apnea (adult) (pediatric): Secondary | ICD-10-CM | POA: Diagnosis not present

## 2016-09-21 DIAGNOSIS — R21 Rash and other nonspecific skin eruption: Secondary | ICD-10-CM | POA: Diagnosis not present

## 2016-09-26 DIAGNOSIS — R0602 Shortness of breath: Secondary | ICD-10-CM | POA: Diagnosis not present

## 2016-09-26 DIAGNOSIS — G4733 Obstructive sleep apnea (adult) (pediatric): Secondary | ICD-10-CM | POA: Diagnosis not present

## 2016-09-29 DIAGNOSIS — G4733 Obstructive sleep apnea (adult) (pediatric): Secondary | ICD-10-CM | POA: Diagnosis not present

## 2016-10-12 DIAGNOSIS — M545 Low back pain: Secondary | ICD-10-CM | POA: Diagnosis not present

## 2016-10-30 ENCOUNTER — Inpatient Hospital Stay (HOSPITAL_COMMUNITY)
Admission: EM | Admit: 2016-10-30 | Discharge: 2016-11-02 | DRG: 390 | Disposition: A | Discharge status: Home / Self Care | Payer: Medicare Other | Attending: Internal Medicine | Admitting: Internal Medicine

## 2016-10-30 ENCOUNTER — Encounter (HOSPITAL_COMMUNITY): Payer: Self-pay | Admitting: Emergency Medicine

## 2016-10-30 DIAGNOSIS — Z8501 Personal history of malignant neoplasm of esophagus: Secondary | ICD-10-CM

## 2016-10-30 DIAGNOSIS — I10 Essential (primary) hypertension: Secondary | ICD-10-CM | POA: Diagnosis not present

## 2016-10-30 DIAGNOSIS — I351 Nonrheumatic aortic (valve) insufficiency: Secondary | ICD-10-CM | POA: Diagnosis not present

## 2016-10-30 DIAGNOSIS — K56609 Unspecified intestinal obstruction, unspecified as to partial versus complete obstruction: Secondary | ICD-10-CM | POA: Diagnosis present

## 2016-10-30 DIAGNOSIS — G4733 Obstructive sleep apnea (adult) (pediatric): Secondary | ICD-10-CM | POA: Diagnosis not present

## 2016-10-30 DIAGNOSIS — K56600 Partial intestinal obstruction, unspecified as to cause: Secondary | ICD-10-CM | POA: Diagnosis not present

## 2016-10-30 DIAGNOSIS — R109 Unspecified abdominal pain: Secondary | ICD-10-CM | POA: Diagnosis not present

## 2016-10-30 DIAGNOSIS — E876 Hypokalemia: Secondary | ICD-10-CM | POA: Diagnosis not present

## 2016-10-30 DIAGNOSIS — Z91030 Bee allergy status: Secondary | ICD-10-CM | POA: Diagnosis not present

## 2016-10-30 DIAGNOSIS — Z79899 Other long term (current) drug therapy: Secondary | ICD-10-CM | POA: Diagnosis not present

## 2016-10-30 DIAGNOSIS — K56699 Other intestinal obstruction unspecified as to partial versus complete obstruction: Secondary | ICD-10-CM | POA: Diagnosis not present

## 2016-10-30 DIAGNOSIS — F17290 Nicotine dependence, other tobacco product, uncomplicated: Secondary | ICD-10-CM | POA: Diagnosis present

## 2016-10-30 LAB — URINALYSIS, ROUTINE W REFLEX MICROSCOPIC
Bacteria, UA: NONE SEEN
Bilirubin Urine: NEGATIVE
GLUCOSE, UA: NEGATIVE mg/dL
KETONES UR: NEGATIVE mg/dL
LEUKOCYTES UA: NEGATIVE
Nitrite: NEGATIVE
PH: 5 (ref 5.0–8.0)
Protein, ur: NEGATIVE mg/dL
Specific Gravity, Urine: 1.017 (ref 1.005–1.030)

## 2016-10-30 LAB — CBC
HEMATOCRIT: 40.7 % (ref 39.0–52.0)
HEMOGLOBIN: 14 g/dL (ref 13.0–17.0)
MCH: 32.6 pg (ref 26.0–34.0)
MCHC: 34.4 g/dL (ref 30.0–36.0)
MCV: 94.7 fL (ref 78.0–100.0)
Platelets: 303 10*3/uL (ref 150–400)
RBC: 4.3 MIL/uL (ref 4.22–5.81)
RDW: 12.6 % (ref 11.5–15.5)
WBC: 12.2 10*3/uL — ABNORMAL HIGH (ref 4.0–10.5)

## 2016-10-30 LAB — COMPREHENSIVE METABOLIC PANEL
ALT: 15 U/L — ABNORMAL LOW (ref 17–63)
AST: 20 U/L (ref 15–41)
Albumin: 4.5 g/dL (ref 3.5–5.0)
Alkaline Phosphatase: 70 U/L (ref 38–126)
Anion gap: 10 (ref 5–15)
BILIRUBIN TOTAL: 0.6 mg/dL (ref 0.3–1.2)
BUN: 15 mg/dL (ref 6–20)
CO2: 26 mmol/L (ref 22–32)
Calcium: 9.2 mg/dL (ref 8.9–10.3)
Chloride: 101 mmol/L (ref 101–111)
Creatinine, Ser: 1.11 mg/dL (ref 0.61–1.24)
GFR calc non Af Amer: >60 mL/min (ref >60)
Glucose, Bld: 105 mg/dL — ABNORMAL HIGH (ref 65–99)
POTASSIUM: 4 mmol/L (ref 3.5–5.1)
SODIUM: 137 mmol/L (ref 135–145)
Total Protein: 7.3 g/dL (ref 6.5–8.1)

## 2016-10-30 LAB — LIPASE, BLOOD: Lipase: 23 U/L (ref 11–51)

## 2016-10-30 MED ORDER — SODIUM CHLORIDE 0.9 % IV BOLUS (SEPSIS)
1000.0000 mL | Freq: Once | INTRAVENOUS | Status: AC
  Administered 2016-10-30: 1000 mL via INTRAVENOUS

## 2016-10-30 MED ORDER — MORPHINE SULFATE (PF) 4 MG/ML IV SOLN
4.0000 mg | Freq: Once | INTRAVENOUS | Status: AC
  Administered 2016-10-30: 4 mg via INTRAVENOUS
  Filled 2016-10-30: qty 1

## 2016-10-30 NOTE — ED Triage Notes (Signed)
Patient is complaining of abdominal pain that started around 10 am. Patients last bowel movement was today.

## 2016-10-30 NOTE — ED Provider Notes (Signed)
[facility] DEPT Provider Note   CSN: [id number] Arrival date & time: 10/30/16  2217  By signing my name below, I, Margit Banda, attest that this documentation has been prepared under the direction and in the presence of Jola Schmidt, MD. Electronically Signed: Margit Banda, ED Scribe. 10/30/16. 11:44 PM.  History   Chief Complaint Chief Complaint  Patient presents with  . Abdominal Pain    HPI Oscar [name] is a 83 y.o. male who presents to the Emergency Department complaining of constant, gradually improving, abdominal pain that started ~ 10:30 am on 10/30/16. Pain started after eating breakfast. Last normal BM was today. Esophogeal cancer and had surgery twice. No hx of bowel obstruction. Pt denies nausea, and vomiting.   The history is provided by the patient. No language interpreter was used.    Past Medical History:  Diagnosis Date  . Acid reflux   . Barrett's esophagus   . BPH (benign prostatic hyperplasia)   . Colon polyps   . DDD (degenerative disc disease)   . Diabetes mellitus    controlled with diet and exercise  . ED (erectile dysfunction)   . Esophageal cancer ([place])    tx'd surgery - abdominal & right posterior chest approach (2001) - [place], [place]  . First degree AV block   . Gout   . Hemorrhoids   . History of palpitations   . Hyperlipidemia   . Hypertension   . Lipoma   . Obesity   . Osteoarthritis   . Tinea versicolor   . Tinnitus     Patient Active Problem List   Diagnosis Date Noted  . Aortic insufficiency 07/09/2013  . HTN (hypertension) 07/09/2013  . PERSONAL HISTORY MALIGNANT NEOPLASM ESOPHAGUS 07/17/2009  . GERD 07/13/2009  . BARRETT'S ESOPHAGUS 07/13/2009  . DYSPHAGIA 07/13/2009  . FLATULENCE-GAS-BLOATING 07/13/2009    Past Surgical History:  Procedure Laterality Date  . bilateral olecranon bursal excisions    . Esophageal Cancer Surgery  2000, 2001  . GASTRIC RESECTION    . HEMORRHOID SURGERY    . NM MYOCAR PERF WALL  MOTION  2011   dipyridamole myoview - stress images show medium in size, moderate in intensity perfusion defect in basal inferior & mid inferior walls with mild defect reversibility at rest, EF 64%  . TRANSTHORACIC ECHOCARDIOGRAM  2011   EF=>55%, mild mitral annular calcif, mild calcif of MV apparatus, mild TR, normal RSVP, mild AV regurg, aortic root sclerosis/calcifiication  . VASECTOMY         Home Medications    Prior to Admission medications   Medication Sig Start Date End Date Taking? Authorizing Provider  atenolol (TENORMIN) 25 MG tablet Take 1 tablet (25 mg total) by mouth daily. Patient taking differently: Take 25 mg by mouth daily after breakfast.  07/09/13  Yes Hilty, Nadean Corwin, MD  HYDROcodone-acetaminophen (NORCO/VICODIN) 5-325 MG per tablet Take 1 tablet by mouth every 6 (six) hours as needed for moderate pain.   Yes [provider]  losartan-hydrochlorothiazide (HYZAAR) 100-12.5 MG per tablet Take 0.5 tablets by mouth every morning. 08/05/14  Yes [provider]  omeprazole (PRILOSEC) 20 MG capsule Take 40 mg by mouth 2 (two) times daily before a meal.    Yes [provider]  Saw Palmetto, Serenoa repens, (SAW PALMETTO PO) Take 2 capsules by mouth daily.   Yes [provider]  traMADol (ULTRAM) 50 MG tablet Take 50-100 mg by mouth 3 (three) times daily as needed for moderate pain or severe  pain.  10/06/16  Yes [provider]  vitamin B-12 (CYANOCOBALAMIN) 1000 MCG tablet Take 1,000 mcg by mouth daily.   Yes [provider]  losartan-hydrochlorothiazide (HYZAAR) 100-25 MG per tablet Take 0.5 tablets by mouth daily. Patient not taking: Reported on 09/12/2014 07/09/13   Pixie Casino, MD    Family History Family History  Problem Relation Age of Onset  . Diabetes Mother   . CAD Mother   . Stroke Sister   . Colon cancer Neg Hx   . Esophageal cancer Neg Hx   . Rectal cancer Neg Hx   . Stomach cancer Neg Hx     Social  History Social History  Substance Use Topics  . Smoking status: Current Some Day Smoker    Years: 50.00    Types: Cigars, Pipe  . Smokeless tobacco: Never Used     Comment: 2-3x/daily, sometimes not at all , tobacco info given 08/01/13  . Alcohol use 3.5 - 7.0 oz/week    7 - 14 drink(s) per week     Allergies   Bee venom   Review of Systems Review of Systems  A complete 10 system review of systems was obtained and all systems are negative except as noted in the HPI and PMH.   Physical Exam Updated Vital Signs BP (!) 161/76 (BP Location: Left Arm)   Pulse 88   Temp 97.8 F (36.6 C) (Oral)   Resp 18   Ht 6\' 1"  (1.854 m)   Wt 90.7 kg (200 lb)   SpO2 98%   BMI 26.39 kg/m   Physical Exam  Constitutional: He is oriented to person, place, and time. He appears well-developed and well-nourished.  HENT:  Head: Normocephalic and atraumatic.  Eyes: EOM are normal.  Neck: Normal range of motion.  Cardiovascular: Normal rate, regular rhythm, normal heart sounds and intact distal pulses.   Pulmonary/Chest: Effort normal and breath sounds normal. No respiratory distress.  Abdominal: Soft. He exhibits no distension. There is tenderness (mild generalized).  Musculoskeletal: Normal range of motion.  Neurological: He is alert and oriented to person, place, and time.  Skin: Skin is warm and dry.  Psychiatric: He has a normal mood and affect. Judgment normal.  Nursing note and vitals reviewed.    ED Treatments / Results  DIAGNOSTIC STUDIES: Oxygen Saturation is 97% on RA, normal by my interpretation.   COORDINATION OF CARE: 11:44 PM-Discussed next steps with pt which includes a CT scan of his abdomen. Pt verbalized understanding and is agreeable with the plan.   Labs (all labs ordered are listed, but only abnormal results are displayed) Labs Reviewed  COMPREHENSIVE METABOLIC PANEL - Abnormal; Notable for the following:       Result Value   Glucose, Bld 105 (*)    ALT 15  (*)    All other components within normal limits  CBC - Abnormal; Notable for the following:    WBC 12.2 (*)    All other components within normal limits  URINALYSIS, ROUTINE W REFLEX MICROSCOPIC - Abnormal; Notable for the following:    Hgb urine dipstick SMALL (*)    Squamous Epithelial / LPF 0-5 (*)    All other components within normal limits  LIPASE, BLOOD    EKG  EKG Interpretation None       Radiology Ct Abdomen Pelvis W Contrast  Result Date: 10/31/2016 CLINICAL DATA:  Upper abdominal pain, history of surgery for esophageal cancer EXAM: CT ABDOMEN AND PELVIS WITH CONTRAST TECHNIQUE: Multidetector  CT imaging of the abdomen and pelvis was performed using the standard protocol following bolus administration of intravenous contrast. CONTRAST:  100 cc Isovue 300 intravenous COMPARISON:  09/12/2014 FINDINGS: Lower chest: Lung bases demonstrate scarring in the posterior lung bases. No pleural effusion or consolidation. Borderline to mild cardiomegaly. Evidence of prior gastric pull-up surgery. Hepatobiliary: Dilated gallbladder containing multiple stones. No biliary dilatation or focal hepatic abnormality. Pancreas: Unremarkable. No pancreatic ductal dilatation or surrounding inflammatory changes. Spleen: Normal in size without focal abnormality. Adrenals/Urinary Tract: Adrenal glands are within normal limits. Kidneys show no hydronephrosis. Stable hypodense lesions in the left kidney presumably cysts. Bladder unremarkable. Stomach/Bowel: Dilated loops of fluid-filled small bowel measuring up to 3.2 cm within the abdomen and central pelvis with the at least 1 transition point visualized anteriorly at the umbilical level. No colon wall thickening. Normal appendix. Sigmoid colon diverticula without acute inflammation Vascular/Lymphatic: Aortic atherosclerosis. No enlarged abdominal or pelvic lymph nodes. Reproductive: Markedly enlarged prostate with mass effect on the posterior bladder Other:  Small amount of free fluid in the pelvis.  No free air. Musculoskeletal: Degenerative changes of the spine. No acute or suspicious bone lesion IMPRESSION: 1. Several loops of fluid-filled dilated small bowel within the abdomen and pelvis with at least 1 transition point visualized within the anterior abdomen ; findings would be consistent with bowel obstruction, probably due to adhesions. 2. Dilated gallbladder with multiple stones. Could correlate with ultrasound if symptoms are suggestive of gallbladder pathology 3. Evidence of prior gastric pull-up surgery. 4. Small amount of free fluid in the pelvis 5. Sigmoid colon diverticular disease without acute inflammation Electronically Signed   By: Donavan Foil M.D.   On: 10/31/2016 02:07    Procedures Procedures (including critical care time)  Medications Ordered in ED Medications  sodium chloride 0.9 % bolus 1,000 mL (1,000 mLs Intravenous New Bag/Given 10/30/16 2356)  morphine 4 MG/ML injection 4 mg (4 mg Intravenous Given 10/30/16 2356)  morphine 4 MG/ML injection 4 mg (4 mg Intravenous Given 10/31/16 0132)  iopamidol (ISOVUE-300) 61 % injection 100 mL (100 mLs Intravenous Contrast Given 10/31/16 0139)     Initial Impression / Assessment and Plan / ED Course  I have reviewed the triage vital signs and the nursing notes.  Pertinent labs & imaging results that were available during my care of the patient were reviewed by me and considered in my medical decision making (see chart for details).     Patient with evidence of small bowel obstruction on CT. NG tube will be placed. Patient be admitted to hospital. General surgery consultation in the morning. Hospitalist admission. Hx of esophageal cancer and surgery x 2 at the [facility] hospital in [place] in 2000 and 2001  Final Clinical Impressions(s) / ED Diagnoses   Final diagnoses:  Small bowel obstruction ([place])    New Prescriptions New Prescriptions   No medications on file   I personally  performed the services described in this documentation, which was scribed in my presence. The recorded information has been reviewed and is accurate.       Jola Schmidt, MD 10/31/16 [phone]

## 2016-10-31 ENCOUNTER — Encounter (HOSPITAL_COMMUNITY): Payer: Self-pay

## 2016-10-31 ENCOUNTER — Emergency Department (HOSPITAL_COMMUNITY): Payer: Medicare Other

## 2016-10-31 ENCOUNTER — Inpatient Hospital Stay (HOSPITAL_COMMUNITY): Payer: Medicare Other

## 2016-10-31 DIAGNOSIS — K56600 Partial intestinal obstruction, unspecified as to cause: Secondary | ICD-10-CM | POA: Diagnosis not present

## 2016-10-31 DIAGNOSIS — K5669 Other partial intestinal obstruction: Secondary | ICD-10-CM | POA: Diagnosis not present

## 2016-10-31 DIAGNOSIS — Z79899 Other long term (current) drug therapy: Secondary | ICD-10-CM | POA: Diagnosis not present

## 2016-10-31 DIAGNOSIS — K56609 Unspecified intestinal obstruction, unspecified as to partial versus complete obstruction: Secondary | ICD-10-CM | POA: Diagnosis not present

## 2016-10-31 DIAGNOSIS — R109 Unspecified abdominal pain: Secondary | ICD-10-CM | POA: Diagnosis not present

## 2016-10-31 DIAGNOSIS — Z8501 Personal history of malignant neoplasm of esophagus: Secondary | ICD-10-CM | POA: Diagnosis not present

## 2016-10-31 DIAGNOSIS — F17290 Nicotine dependence, other tobacco product, uncomplicated: Secondary | ICD-10-CM | POA: Diagnosis present

## 2016-10-31 DIAGNOSIS — E876 Hypokalemia: Secondary | ICD-10-CM | POA: Diagnosis present

## 2016-10-31 DIAGNOSIS — Z91030 Bee allergy status: Secondary | ICD-10-CM | POA: Diagnosis not present

## 2016-10-31 DIAGNOSIS — R197 Diarrhea, unspecified: Secondary | ICD-10-CM | POA: Diagnosis not present

## 2016-10-31 DIAGNOSIS — I351 Nonrheumatic aortic (valve) insufficiency: Secondary | ICD-10-CM | POA: Diagnosis present

## 2016-10-31 DIAGNOSIS — I10 Essential (primary) hypertension: Secondary | ICD-10-CM

## 2016-10-31 DIAGNOSIS — K8020 Calculus of gallbladder without cholecystitis without obstruction: Secondary | ICD-10-CM | POA: Diagnosis not present

## 2016-10-31 LAB — BASIC METABOLIC PANEL
ANION GAP: 10 (ref 5–15)
BUN: 12 mg/dL (ref 6–20)
CO2: 24 mmol/L (ref 22–32)
Calcium: 8.5 mg/dL — ABNORMAL LOW (ref 8.9–10.3)
Chloride: 105 mmol/L (ref 101–111)
Creatinine, Ser: 0.9 mg/dL (ref 0.61–1.24)
GFR calc Af Amer: >60 mL/min (ref >60)
GLUCOSE: 130 mg/dL — AB (ref 65–99)
POTASSIUM: 3.7 mmol/L (ref 3.5–5.1)
Sodium: 139 mmol/L (ref 135–145)

## 2016-10-31 LAB — CBC
HEMATOCRIT: 37.3 % — AB (ref 39.0–52.0)
HEMOGLOBIN: 12.7 g/dL — AB (ref 13.0–17.0)
MCH: 32.3 pg (ref 26.0–34.0)
MCHC: 34 g/dL (ref 30.0–36.0)
MCV: 94.9 fL (ref 78.0–100.0)
Platelets: 252 10*3/uL (ref 150–400)
RBC: 3.93 MIL/uL — ABNORMAL LOW (ref 4.22–5.81)
RDW: 12.6 % (ref 11.5–15.5)
WBC: 9.4 10*3/uL (ref 4.0–10.5)

## 2016-10-31 LAB — GLUCOSE, CAPILLARY
Glucose-Capillary: 113 mg/dL — ABNORMAL HIGH (ref 65–99)
Glucose-Capillary: 116 mg/dL — ABNORMAL HIGH (ref 65–99)
Glucose-Capillary: 96 mg/dL (ref 65–99)

## 2016-10-31 MED ORDER — VITAMIN B-1 100 MG PO TABS
100.0000 mg | ORAL_TABLET | Freq: Every day | ORAL | Status: DC
  Administered 2016-11-02: 100 mg via ORAL
  Filled 2016-10-31: qty 1

## 2016-10-31 MED ORDER — DIATRIZOATE MEGLUMINE & SODIUM 66-10 % PO SOLN
90.0000 mL | Freq: Once | ORAL | Status: AC
  Administered 2016-10-31: 90 mL via ORAL
  Filled 2016-10-31: qty 90

## 2016-10-31 MED ORDER — MORPHINE SULFATE (PF) 4 MG/ML IV SOLN: 1.0000 mg | INTRAVENOUS | Status: DC | PRN

## 2016-10-31 MED ORDER — LORAZEPAM 1 MG PO TABS: 1.0000 mg | ORAL_TABLET | Freq: Four times a day (QID) | ORAL | Status: DC | PRN

## 2016-10-31 MED ORDER — HYDRALAZINE HCL 20 MG/ML IJ SOLN
10.0000 mg | INTRAMUSCULAR | Status: DC | PRN
  Administered 2016-11-02: 10 mg via INTRAVENOUS
  Filled 2016-10-31: qty 0.5

## 2016-10-31 MED ORDER — METOPROLOL TARTRATE 5 MG/5ML IV SOLN
2.5000 mg | Freq: Four times a day (QID) | INTRAVENOUS | Status: DC
  Administered 2016-10-31 – 2016-11-02 (×10): 2.5 mg via INTRAVENOUS
  Filled 2016-10-31 (×10): qty 5

## 2016-10-31 MED ORDER — ONDANSETRON HCL 4 MG/2ML IJ SOLN: 4.0000 mg | Freq: Four times a day (QID) | INTRAMUSCULAR | Status: DC | PRN

## 2016-10-31 MED ORDER — ACETAMINOPHEN 650 MG RE SUPP: 650.0000 mg | Freq: Four times a day (QID) | RECTAL | Status: DC | PRN

## 2016-10-31 MED ORDER — LORAZEPAM 2 MG/ML IJ SOLN: 1.0000 mg | Freq: Four times a day (QID) | INTRAMUSCULAR | Status: DC | PRN

## 2016-10-31 MED ORDER — LIDOCAINE HCL 2 % EX GEL
1.0000 "application " | Freq: Once | CUTANEOUS | Status: DC | PRN
  Filled 2016-10-31: qty 11

## 2016-10-31 MED ORDER — MORPHINE SULFATE (PF) 4 MG/ML IV SOLN
4.0000 mg | Freq: Once | INTRAVENOUS | Status: AC
  Administered 2016-10-31: 4 mg via INTRAVENOUS
  Filled 2016-10-31: qty 1

## 2016-10-31 MED ORDER — IOPAMIDOL (ISOVUE-300) INJECTION 61%
INTRAVENOUS | Status: AC
  Filled 2016-10-31: qty 100

## 2016-10-31 MED ORDER — IOPAMIDOL (ISOVUE-300) INJECTION 61%
100.0000 mL | Freq: Once | INTRAVENOUS | Status: AC | PRN
  Administered 2016-10-31: 100 mL via INTRAVENOUS

## 2016-10-31 MED ORDER — ONDANSETRON HCL 4 MG PO TABS: 4.0000 mg | ORAL_TABLET | Freq: Four times a day (QID) | ORAL | Status: DC | PRN

## 2016-10-31 MED ORDER — ADULT MULTIVITAMIN W/MINERALS CH
1.0000 | ORAL_TABLET | Freq: Every day | ORAL | Status: DC
  Administered 2016-11-02: 1 via ORAL
  Filled 2016-10-31: qty 1

## 2016-10-31 MED ORDER — ACETAMINOPHEN 325 MG PO TABS: 650.0000 mg | ORAL_TABLET | Freq: Four times a day (QID) | ORAL | Status: DC | PRN

## 2016-10-31 MED ORDER — DEXTROSE-NACL 5-0.9 % IV SOLN
INTRAVENOUS | Status: AC
  Administered 2016-10-31: 03:00:00 via INTRAVENOUS

## 2016-10-31 MED ORDER — FOLIC ACID 1 MG PO TABS
1.0000 mg | ORAL_TABLET | Freq: Every day | ORAL | Status: DC
  Administered 2016-11-02: 1 mg via ORAL
  Filled 2016-10-31: qty 1

## 2016-10-31 MED ORDER — THIAMINE HCL 100 MG/ML IJ SOLN
100.0000 mg | Freq: Every day | INTRAMUSCULAR | Status: DC
  Administered 2016-11-01: 100 mg via INTRAVENOUS
  Filled 2016-10-31: qty 2

## 2016-10-31 NOTE — Progress Notes (Signed)
Patient ID: Oscar [name], male   DOB: May 11, 1932, 83 y.o.   MRN: [id number]  PROGRESS NOTE    Oscar [name]  IOX:[id number] DOB: 08/21/1932 DOA: 10/30/2016 PCP: Deland Pretty, MD   Brief Narrative:  83 year old male with history of hypertension, esophageal cancer status post surgery in 2001, sleep apnea presented with complaints of abdominal pain. He was found to have small bowel obstruction; surgery was consulted.   Assessment & Plan:   Principal Problem:   SBO (small bowel obstruction) (HCC) Active Problems:   HTN (hypertension)   1. Small bowel obstruction - currently managed conservatively; follow-up general surgery recommendations. Continue nothing by mouth. IV analgesics. 2. Gallstones - awaiting general surgery evaluation and recommendations. 3. Hypertension - monitor. Use when necessary IV metoprolol and/or hydralazine 4. History of sleep apnea on CPAP. 5. History of aortic insufficiency and diastolic dysfunction - appears to be compensated. 6. Question of alcohol abuse: No signs of alcohol withdrawal; CIWA protocol   DVT prophylaxis: SCDs. Code Status: Full code.  Family Communication:  none present at bedside Disposition Plan: Home in 1-3 days  Procedures: None  Antimicrobials: None    Subjective: Patient seen and examined at bedside. He denies current abdominal pain, nausea, fever or chest pains.  Objective: Vitals:   10/31/16 0331 10/31/16 0520 10/31/16 0529 10/31/16 0658  BP: (!) 154/78  (!) 177/75 (!) 168/94  Pulse: 67  64 (!) 57  Resp: 18  20   Temp:   98.4 F (36.9 C)   TempSrc:   Oral   SpO2: 98%  98%   Weight:  91.9 kg (202 lb 9.6 oz)    Height:  6\' 1"  (1.854 m)      Intake/Output Summary (Last 24 hours) at 10/31/16 1100 Last data filed at 10/31/16 2683  Gross per 24 hour  Intake          1246.25 ml  Output              450 ml  Net           796.25 ml   Filed Weights   10/30/16 2225 10/31/16 0520  Weight: 90.7 kg (200 lb) 91.9 kg  (202 lb 9.6 oz)    Examination:  General exam: Appears calm and comfortable  Respiratory system: Bilateral decreased breath sound at bases Cardiovascular system: S1 & S2 heard, Rate controlled  Gastrointestinal system: Abdomen is nondistended, soft and mildly tender diffusely with sluggish bowel sounds.  Extremities: No cyanosis, clubbing, edema    Data Reviewed: I have personally reviewed following labs and imaging studies  CBC:  Recent Labs Lab 10/30/16 2252 10/31/16 0555  WBC 12.2* 9.4  HGB 14.0 12.7*  HCT 40.7 37.3*  MCV 94.7 94.9  PLT 303 419   Basic Metabolic Panel:  Recent Labs Lab 10/30/16 2252 10/31/16 0555  NA 137 139  K 4.0 3.7  CL 101 105  CO2 26 24  GLUCOSE 105* 130*  BUN 15 12  CREATININE 1.11 0.90  CALCIUM 9.2 8.5*   GFR: Estimated Creatinine Clearance: 72.7 mL/min (by C-G formula based on SCr of 0.9 mg/dL). Liver Function Tests:  Recent Labs Lab 10/30/16 2252  AST 20  ALT 15*  ALKPHOS 70  BILITOT 0.6  PROT 7.3  ALBUMIN 4.5    Recent Labs Lab 10/30/16 2252  LIPASE 23   No results for input(s): AMMONIA in the last 168 hours. Coagulation Profile: No results for input(s): INR, PROTIME in the last 168 hours. Cardiac Enzymes:  No results for input(s): CKTOTAL, CKMB, CKMBINDEX, TROPONINI in the last 168 hours. BNP (last 3 results) No results for input(s): PROBNP in the last 8760 hours. HbA1C: No results for input(s): HGBA1C in the last 72 hours. CBG:  Recent Labs Lab 10/31/16 0539  GLUCAP 113*   Lipid Profile: No results for input(s): CHOL, HDL, LDLCALC, TRIG, CHOLHDL, LDLDIRECT in the last 72 hours. Thyroid Function Tests: No results for input(s): TSH, T4TOTAL, FREET4, T3FREE, THYROIDAB in the last 72 hours. Anemia Panel: No results for input(s): VITAMINB12, FOLATE, FERRITIN, TIBC, IRON, RETICCTPCT in the last 72 hours. Sepsis Labs: No results for input(s): PROCALCITON, LATICACIDVEN in the last 168 hours.  No results found  for this or any previous visit (from the past 240 hour(s)).       Radiology Studies: Ct Abdomen Pelvis W Contrast  Result Date: 10/31/2016 CLINICAL DATA:  Upper abdominal pain, history of surgery for esophageal cancer EXAM: CT ABDOMEN AND PELVIS WITH CONTRAST TECHNIQUE: Multidetector CT imaging of the abdomen and pelvis was performed using the standard protocol following bolus administration of intravenous contrast. CONTRAST:  100 cc Isovue 300 intravenous COMPARISON:  09/12/2014 FINDINGS: Lower chest: Lung bases demonstrate scarring in the posterior lung bases. No pleural effusion or consolidation. Borderline to mild cardiomegaly. Evidence of prior gastric pull-up surgery. Hepatobiliary: Dilated gallbladder containing multiple stones. No biliary dilatation or focal hepatic abnormality. Pancreas: Unremarkable. No pancreatic ductal dilatation or surrounding inflammatory changes. Spleen: Normal in size without focal abnormality. Adrenals/Urinary Tract: Adrenal glands are within normal limits. Kidneys show no hydronephrosis. Stable hypodense lesions in the left kidney presumably cysts. Bladder unremarkable. Stomach/Bowel: Dilated loops of fluid-filled small bowel measuring up to 3.2 cm within the abdomen and central pelvis with the at least 1 transition point visualized anteriorly at the umbilical level. No colon wall thickening. Normal appendix. Sigmoid colon diverticula without acute inflammation Vascular/Lymphatic: Aortic atherosclerosis. No enlarged abdominal or pelvic lymph nodes. Reproductive: Markedly enlarged prostate with mass effect on the posterior bladder Other: Small amount of free fluid in the pelvis.  No free air. Musculoskeletal: Degenerative changes of the spine. No acute or suspicious bone lesion IMPRESSION: 1. Several loops of fluid-filled dilated small bowel within the abdomen and pelvis with at least 1 transition point visualized within the anterior abdomen ; findings would be consistent  with bowel obstruction, probably due to adhesions. 2. Dilated gallbladder with multiple stones. Could correlate with ultrasound if symptoms are suggestive of gallbladder pathology 3. Evidence of prior gastric pull-up surgery. 4. Small amount of free fluid in the pelvis 5. Sigmoid colon diverticular disease without acute inflammation Electronically Signed   By: Donavan Foil M.D.   On: 10/31/2016 02:07        Scheduled Meds: . diatrizoate meglumine-sodium  90 mL Oral Once  . folic acid  1 mg Oral Daily  . metoprolol tartrate  2.5 mg Intravenous Q6H  . multivitamin with minerals  1 tablet Oral Daily  . thiamine  100 mg Oral Daily   Or  . thiamine  100 mg Intravenous Daily   Continuous Infusions: . dextrose 5 % and 0.9% NaCl 75 mL/hr at 10/31/16 0318     LOS: 0 days        Aline August, MD [facility] Pager [phone]  If 7PM-7AM, please contact night-coverage [email or web address] Password [email or web address] 10/31/2016, 11:00 AM

## 2016-10-31 NOTE — ED Notes (Signed)
Report to [facility] on [address].

## 2016-10-31 NOTE — Progress Notes (Signed)
Patient arrived to room 1502 from ED. Patient is A&Ox4. Patient from home, lives with wife. Abd pain began earlier in day, has had no nausea or vomiting. Patient is ambulatory and steady. No falls recently. Skin intact. Scars noted to abdomen. Left AC iv site infusing. Patient has no complaints at this time other than just wanting a nap. States he refused the NG tube downstairs. Bowel sounds are hypoactive, abd is soft. Patient states last BM 6/24. Oriented to room, call bell and advised that he is NPO at this time. Patient verbalized understanding. Will c/t monitor. Advised to call if he needs to get OOB and pt verbalized understanding.

## 2016-10-31 NOTE — ED Notes (Signed)
Pt ride home: Christie Beckers [phone]

## 2016-10-31 NOTE — H&P (Addendum)
History and Physical    Oscar [name] WRU:[id number] DOB: 1932/08/05 DOA: 10/30/2016  PCP: Deland Pretty, MD  Patient coming from: Home.  Chief Complaint: Abdominal pain.  HPI: Oscar [name] is a 83 y.o. male with history of hypertension, esophageal cancer status post surgery in 2001, sleep apnea presents to the ER with complaints of persistent abdominal pain since yesterday morning. Denies any vomiting. Last bowel movement was last evening. Pain is mostly in the epigastric area sharp spreading throughout the abdomen at times severe. Denies any fever or chills.   ED Course: In the ER CT of the abdomen and pelvis done shows features concerning for small bowel obstruction with transition point. There is also gallstones. Dr. Excell Seltzer on-call general surgeon has been consulted. Unable to place NG tube.  Review of Systems: As per HPI, rest all negative.   Past Medical History:  Diagnosis Date  . Acid reflux   . Barrett's esophagus   . BPH (benign prostatic hyperplasia)   . Colon polyps   . DDD (degenerative disc disease)   . Diabetes mellitus    controlled with diet and exercise  . ED (erectile dysfunction)   . Esophageal cancer ([place])    tx'd surgery - abdominal & right posterior chest approach (2001) - [place], [place]  . First degree AV block   . Gout   . Hemorrhoids   . History of palpitations   . Hyperlipidemia   . Hypertension   . Lipoma   . Obesity   . Osteoarthritis   . Tinea versicolor   . Tinnitus     Past Surgical History:  Procedure Laterality Date  . bilateral olecranon bursal excisions    . Esophageal Cancer Surgery  2000, 2001  . GASTRIC RESECTION    . HEMORRHOID SURGERY    . NM MYOCAR PERF WALL MOTION  2011   dipyridamole myoview - stress images show medium in size, moderate in intensity perfusion defect in basal inferior & mid inferior walls with mild defect reversibility at rest, EF 64%  . TRANSTHORACIC ECHOCARDIOGRAM  2011   EF=>55%, mild mitral  annular calcif, mild calcif of MV apparatus, mild TR, normal RSVP, mild AV regurg, aortic root sclerosis/calcifiication  . VASECTOMY       reports that he has been smoking Cigars and Pipe.  He has smoked for the past 50.00 years. He has never used smokeless tobacco. He reports that he drinks about 3.5 - 7.0 oz of alcohol per week . He reports that he does not use drugs.  Allergies  Allergen Reactions  . Bee Venom     Family History  Problem Relation Age of Onset  . Diabetes Mother   . CAD Mother   . Stroke Sister   . Colon cancer Neg Hx   . Esophageal cancer Neg Hx   . Rectal cancer Neg Hx   . Stomach cancer Neg Hx     Prior to Admission medications   Medication Sig Start Date End Date Taking? Authorizing Provider  atenolol (TENORMIN) 25 MG tablet Take 1 tablet (25 mg total) by mouth daily. Patient taking differently: Take 25 mg by mouth daily after breakfast.  07/09/13  Yes Hilty, Nadean Corwin, MD  HYDROcodone-acetaminophen (NORCO/VICODIN) 5-325 MG per tablet Take 1 tablet by mouth every 6 (six) hours as needed for moderate pain.   Yes [provider]  losartan-hydrochlorothiazide (HYZAAR) 100-12.5 MG per tablet Take 0.5 tablets by mouth every morning. 08/05/14  Yes [provider]  omeprazole (PRILOSEC) 20 MG capsule Take 40 mg by mouth 2 (two) times daily before a meal.    Yes [provider]  Saw Palmetto, Serenoa repens, (SAW PALMETTO PO) Take 2 capsules by mouth daily.   Yes [provider]  traMADol (ULTRAM) 50 MG tablet Take 50-100 mg by mouth 3 (three) times daily as needed for moderate pain or severe pain.  10/06/16  Yes [provider]  vitamin B-12 (CYANOCOBALAMIN) 1000 MCG tablet Take 1,000 mcg by mouth daily.   Yes [provider]  losartan-hydrochlorothiazide (HYZAAR) 100-25 MG per tablet Take 0.5 tablets by mouth daily. Patient not taking: Reported on 09/12/2014 07/09/13   Pixie Casino, MD    Physical Exam: Vitals:    10/30/16 2225 10/31/16 0125  BP: (!) 161/90 (!) 161/76  Pulse: 62 88  Resp: 16 18  Temp: 97.8 F (36.6 C)   TempSrc: Oral   SpO2: 97% 98%  Weight: 90.7 kg (200 lb)   Height: 6\' 1"  (1.854 m)       Constitutional: Moderately built and nourished. Vitals:   10/30/16 2225 10/31/16 0125  BP: (!) 161/90 (!) 161/76  Pulse: 62 88  Resp: 16 18  Temp: 97.8 F (36.6 C)   TempSrc: Oral   SpO2: 97% 98%  Weight: 90.7 kg (200 lb)   Height: 6\' 1"  (1.854 m)    Eyes: Anicteric no pallor. ENMT: No discharge from the ears eyes nose and mouth. Neck: No mass. There are no neck rigidity. Respiratory: No rhonchi or crepitations. Cardiovascular: S1-S2 heard no murmurs appreciated. Abdomen: Soft nontender bowel sounds not appreciated. Musculoskeletal: No edema. No joint effusion. Skin: No rash. Skin appears warm. Neurologic: Alert awake oriented to time place and person. Moves all extremities. Psychiatric: Appears normal. Normal affect.   Labs on Admission: I have personally reviewed following labs and imaging studies  CBC:  Recent Labs Lab 10/30/16 2252  WBC 12.2*  HGB 14.0  HCT 40.7  MCV 94.7  PLT 952   Basic Metabolic Panel:  Recent Labs Lab 10/30/16 2252  NA 137  K 4.0  CL 101  CO2 26  GLUCOSE 105*  BUN 15  CREATININE 1.11  CALCIUM 9.2   GFR: Estimated Creatinine Clearance: 59 mL/min (by C-G formula based on SCr of 1.11 mg/dL). Liver Function Tests:  Recent Labs Lab 10/30/16 2252  AST 20  ALT 15*  ALKPHOS 70  BILITOT 0.6  PROT 7.3  ALBUMIN 4.5    Recent Labs Lab 10/30/16 2252  LIPASE 23   No results for input(s): AMMONIA in the last 168 hours. Coagulation Profile: No results for input(s): INR, PROTIME in the last 168 hours. Cardiac Enzymes: No results for input(s): CKTOTAL, CKMB, CKMBINDEX, TROPONINI in the last 168 hours. BNP (last 3 results) No results for input(s): PROBNP in the last 8760 hours. HbA1C: No results for input(s): HGBA1C in the  last 72 hours. CBG: No results for input(s): GLUCAP in the last 168 hours. Lipid Profile: No results for input(s): CHOL, HDL, LDLCALC, TRIG, CHOLHDL, LDLDIRECT in the last 72 hours. Thyroid Function Tests: No results for input(s): TSH, T4TOTAL, FREET4, T3FREE, THYROIDAB in the last 72 hours. Anemia Panel: No results for input(s): VITAMINB12, FOLATE, FERRITIN, TIBC, IRON, RETICCTPCT in the last 72 hours. Urine analysis:    Component Value Date/Time   COLORURINE YELLOW 10/30/2016 2326   APPEARANCEUR CLEAR 10/30/2016 2326   LABSPEC 1.017 10/30/2016 2326   PHURINE 5.0 10/30/2016 2326   GLUCOSEU NEGATIVE 10/30/2016 2326  HGBUR SMALL (A) 10/30/2016 2326   BILIRUBINUR NEGATIVE 10/30/2016 2326   KETONESUR NEGATIVE 10/30/2016 2326   PROTEINUR NEGATIVE 10/30/2016 2326   UROBILINOGEN 0.2 09/12/2014 1816   NITRITE NEGATIVE 10/30/2016 2326   LEUKOCYTESUR NEGATIVE 10/30/2016 2326   Sepsis Labs: @LABRCNTIP (procalcitonin:4,lacticidven:4) )No results found for this or any previous visit (from the past 240 hour(s)).   Radiological Exams on Admission: Ct Abdomen Pelvis W Contrast  Result Date: 10/31/2016 CLINICAL DATA:  Upper abdominal pain, history of surgery for esophageal cancer EXAM: CT ABDOMEN AND PELVIS WITH CONTRAST TECHNIQUE: Multidetector CT imaging of the abdomen and pelvis was performed using the standard protocol following bolus administration of intravenous contrast. CONTRAST:  100 cc Isovue 300 intravenous COMPARISON:  09/12/2014 FINDINGS: Lower chest: Lung bases demonstrate scarring in the posterior lung bases. No pleural effusion or consolidation. Borderline to mild cardiomegaly. Evidence of prior gastric pull-up surgery. Hepatobiliary: Dilated gallbladder containing multiple stones. No biliary dilatation or focal hepatic abnormality. Pancreas: Unremarkable. No pancreatic ductal dilatation or surrounding inflammatory changes. Spleen: Normal in size without focal abnormality.  Adrenals/Urinary Tract: Adrenal glands are within normal limits. Kidneys show no hydronephrosis. Stable hypodense lesions in the left kidney presumably cysts. Bladder unremarkable. Stomach/Bowel: Dilated loops of fluid-filled small bowel measuring up to 3.2 cm within the abdomen and central pelvis with the at least 1 transition point visualized anteriorly at the umbilical level. No colon wall thickening. Normal appendix. Sigmoid colon diverticula without acute inflammation Vascular/Lymphatic: Aortic atherosclerosis. No enlarged abdominal or pelvic lymph nodes. Reproductive: Markedly enlarged prostate with mass effect on the posterior bladder Other: Small amount of free fluid in the pelvis.  No free air. Musculoskeletal: Degenerative changes of the spine. No acute or suspicious bone lesion IMPRESSION: 1. Several loops of fluid-filled dilated small bowel within the abdomen and pelvis with at least 1 transition point visualized within the anterior abdomen ; findings would be consistent with bowel obstruction, probably due to adhesions. 2. Dilated gallbladder with multiple stones. Could correlate with ultrasound if symptoms are suggestive of gallbladder pathology 3. Evidence of prior gastric pull-up surgery. 4. Small amount of free fluid in the pelvis 5. Sigmoid colon diverticular disease without acute inflammation Electronically Signed   By: Donavan Foil M.D.   On: 10/31/2016 02:07     Assessment/Plan Principal Problem:   SBO (small bowel obstruction) (HCC) Active Problems:   HTN (hypertension)    1. Small bowel obstruction - Dr. Excell Seltzer, general surgeon has been consulted. Patient will be kept nothing by mouth. Unable to place NG tube. IV fluids premedications and 2. KUB in a.m. and further recommendations per surgery. 2. Gallstones - for input from surgery. 3. Hypertension - I have placed patient on scheduled dose of IV metoprolol and when necessary IV hydralazine. 4. History of sleep apnea on  CPAP. 5. History of aortic insufficiency and diastolic dysfunction - appears to be compensated.  Patient states he drinks alcohol everyday for which I have placed patient on CIWA protocol.   DVT prophylaxis: SCDs. Code Status: Full code.  Family Communication: Discussed with patient.  Disposition Plan: Home.  Consults called: General surgery.  Admission status: Inpatient.    [name] MD [facility] Pager [phone].  If 7PM-7AM, please contact night-coverage [email or web address] Password [email or web address]  10/31/2016, 3:02 AM

## 2016-10-31 NOTE — ED Notes (Addendum)
Pt unable to tolerate NGT insertion (16 Fr) on first attempt; pt refused to have another attempt--- stated, "You've got to find another alternative... I won't be able to take that tube.  I have had two surgeries in my esophagus".  Dr. Hal Hope, hospitalist, was made aware of pt's refusal to NGT insertion.

## 2016-10-31 NOTE — Consult Note (Signed)
[facility] Surgery Consult Note  Satoshi Kalas [facility] [birth year]  [id number].    Requesting MD: Jola Schmidt Chief Complaint/Reason for Consult: SBO HPI:  Patient is an 83 y.o. Male with onset of abdominal pain yesterday AM. Patient states he had abdominal pain around his umbilicus that felt like bloating, pressure and gas. Patient states pain eventually resolved and did not come on again until after supper yesterday. Last BM was yesterday, stool was thinner than patient usually experiences. Currently patient denies any abdominal pain or n/v, and patient has been passing flatus this AM.  NGT placement was attempted last night and failed, patient refused further attempt and would like to avoid having one placed. PMH significant for esophagectomy with gastric pull through in 2001 for esophageal CA. Patient reports he smokes a pipe with tobacco occasionally, sometimes with marijuana as well. Reports having about 1-2 shots of liquor nightly. Patient does not take any blood thinning medications.    ROS: Review of Systems  Constitutional: Negative for chills, fever and malaise/fatigue.  Respiratory: Negative for shortness of breath and wheezing.   Cardiovascular: Negative for chest pain and palpitations.  Gastrointestinal: Positive for abdominal pain (umbilical). Negative for blood in stool, diarrhea, nausea and vomiting.  Genitourinary: Negative for dysuria.  All other systems reviewed and are negative.   Family History  Problem Relation Age of Onset  . Diabetes Mother   . CAD Mother   . Stroke Sister   . Colon cancer Neg Hx   . Esophageal cancer Neg Hx   . Rectal cancer Neg Hx   . Stomach cancer Neg Hx     Past Medical History:  Diagnosis Date  . Acid reflux   . Barrett's esophagus   . BPH (benign prostatic hyperplasia)   . Colon polyps   . DDD (degenerative disc disease)   . Diabetes mellitus    controlled with diet and exercise  . ED (erectile dysfunction)   . Esophageal  cancer ([place])    tx'd surgery - abdominal & right posterior chest approach (2001) - [place], [place]  . First degree AV block   . Gout   . Hemorrhoids   . History of palpitations   . Hyperlipidemia   . Hypertension   . Lipoma   . Obesity   . Osteoarthritis   . Tinea versicolor   . Tinnitus     Past Surgical History:  Procedure Laterality Date  . bilateral olecranon bursal excisions    . Esophageal Cancer Surgery  2000, 2001  . GASTRIC RESECTION    . HEMORRHOID SURGERY    . NM MYOCAR PERF WALL MOTION  2011   dipyridamole myoview - stress images show medium in size, moderate in intensity perfusion defect in basal inferior & mid inferior walls with mild defect reversibility at rest, EF 64%  . TRANSTHORACIC ECHOCARDIOGRAM  2011   EF=>55%, mild mitral annular calcif, mild calcif of MV apparatus, mild TR, normal RSVP, mild AV regurg, aortic root sclerosis/calcifiication  . VASECTOMY      Social History:  reports that he has been smoking Cigars and Pipe.  He has smoked for the past 50.00 years. He has never used smokeless tobacco. He reports that he drinks about 3.5 - 7.0 oz of alcohol per week . He reports that he does not use drugs.  Allergies:  Allergies  Allergen Reactions  . Bee Venom     Medications Prior to Admission  Medication Sig Dispense Refill  . atenolol (TENORMIN) 25 MG tablet  Take 1 tablet (25 mg total) by mouth daily. (Patient taking differently: Take 25 mg by mouth daily after breakfast. ) 30 tablet 11  . HYDROcodone-acetaminophen (NORCO/VICODIN) 5-325 MG per tablet Take 1 tablet by mouth every 6 (six) hours as needed for moderate pain.    [name] losartan-hydrochlorothiazide (HYZAAR) 100-12.5 MG per tablet Take 0.5 tablets by mouth every morning.  3  . omeprazole (PRILOSEC) 20 MG capsule Take 40 mg by mouth 2 (two) times daily before a meal.     . Saw Palmetto, Serenoa repens, (SAW PALMETTO PO) Take 2 capsules by mouth daily.    . traMADol (ULTRAM) 50 MG tablet Take 50-100  mg by mouth 3 (three) times daily as needed for moderate pain or severe pain.   1  . vitamin B-12 (CYANOCOBALAMIN) 1000 MCG tablet Take 1,000 mcg by mouth daily.    [name] losartan-hydrochlorothiazide (HYZAAR) 100-25 MG per tablet Take 0.5 tablets by mouth daily. (Patient not taking: Reported on 09/12/2014) 15 tablet 11    Blood pressure (!) 168/94, pulse (!) 57, temperature 98.4 F (36.9 C), temperature source Oral, resp. rate 20, height '6\' 1"'  (1.854 m), weight 91.9 kg (202 lb 9.6 oz), SpO2 98 %.   Physical Exam  Constitutional: He is oriented to person, place, and time. He appears well-developed and well-nourished. He is active and cooperative.  Non-toxic appearance. No distress.  HENT:  Head: Normocephalic and atraumatic.  Right Ear: External ear normal.  Left Ear: External ear normal.  Nose: Nose normal.  Mouth/Throat: Oropharynx is clear and moist and mucous membranes are normal.  Eyes: Conjunctivae and lids are normal. Pupils are equal, round, and reactive to light. No scleral icterus. Right eye exhibits nystagmus. Left eye exhibits nystagmus.  Neck: Normal range of motion. Neck supple.  Cardiovascular: Normal rate, regular rhythm, S1 normal and S2 normal.   Pulses:      Radial pulses are 2+ on the right side, and 2+ on the left side.       Dorsalis pedis pulses are 2+ on the right side, and 2+ on the left side.  Pulmonary/Chest: Effort normal and breath sounds normal. He has no decreased breath sounds. He has no wheezes. He has no rhonchi. He has no rales.  Abdominal: Soft. He exhibits no distension. Bowel sounds are decreased. There is no tenderness. There is no rigidity, no rebound, no guarding and negative Murphy's sign. No hernia.  Midline surgical scar present  Musculoskeletal:  No gross deformities, moving all extremities appropriately. No edema noted in extremities.   Neurological: He is alert and oriented to person, place, and time. He has normal strength.  Skin: Skin is warm,  dry and intact.  Psychiatric: He has a normal mood and affect. His behavior is normal. Judgment normal.    Results for orders placed or performed during the hospital encounter of 10/30/16 (from the past 48 hour(s))  Lipase, blood     Status: None   Collection Time: 10/30/16 10:52 PM  Result Value Ref Range   Lipase 23 11 - 51 U/L  Comprehensive metabolic panel     Status: Abnormal   Collection Time: 10/30/16 10:52 PM  Result Value Ref Range   Sodium 137 135 - 145 mmol/L   Potassium 4.0 3.5 - 5.1 mmol/L   Chloride 101 101 - 111 mmol/L   CO2 26 22 - 32 mmol/L   Glucose, Bld 105 (H) 65 - 99 mg/dL   BUN 15 6 - 20 mg/dL   Creatinine, Ser  1.11 0.61 - 1.24 mg/dL   Calcium 9.2 8.9 - 10.3 mg/dL   Total Protein 7.3 6.5 - 8.1 g/dL   Albumin 4.5 3.5 - 5.0 g/dL   AST 20 15 - 41 U/L   ALT 15 (L) 17 - 63 U/L   Alkaline Phosphatase 70 38 - 126 U/L   Total Bilirubin 0.6 0.3 - 1.2 mg/dL   GFR calc non Af Amer >60 >60 mL/min   GFR calc Af Amer >60 >60 mL/min    Comment: (NOTE) The eGFR has been calculated using the CKD EPI equation. This calculation has not been validated in all clinical situations. eGFR's persistently <60 mL/min signify possible Chronic Kidney Disease.    Anion gap 10 5 - 15  CBC     Status: Abnormal   Collection Time: 10/30/16 10:52 PM  Result Value Ref Range   WBC 12.2 (H) 4.0 - 10.5 K/uL   RBC 4.30 4.22 - 5.81 MIL/uL   Hemoglobin 14.0 13.0 - 17.0 g/dL   HCT 40.7 39.0 - 52.0 %   MCV 94.7 78.0 - 100.0 fL   MCH 32.6 26.0 - 34.0 pg   MCHC 34.4 30.0 - 36.0 g/dL   RDW 12.6 11.5 - 15.5 %   Platelets 303 150 - 400 K/uL  Urinalysis, Routine w reflex microscopic     Status: Abnormal   Collection Time: 10/30/16 11:26 PM  Result Value Ref Range   Color, Urine YELLOW YELLOW   APPearance CLEAR CLEAR   Specific Gravity, Urine 1.017 1.005 - 1.030   pH 5.0 5.0 - 8.0   Glucose, UA NEGATIVE NEGATIVE mg/dL   Hgb urine dipstick SMALL (A) NEGATIVE   Bilirubin Urine NEGATIVE  NEGATIVE   Ketones, ur NEGATIVE NEGATIVE mg/dL   Protein, ur NEGATIVE NEGATIVE mg/dL   Nitrite NEGATIVE NEGATIVE   Leukocytes, UA NEGATIVE NEGATIVE   RBC / HPF 0-5 0 - 5 RBC/hpf   WBC, UA 0-5 0 - 5 WBC/hpf   Bacteria, UA NONE SEEN NONE SEEN   Squamous Epithelial / LPF 0-5 (A) NONE SEEN   Mucous PRESENT    Hyaline Casts, UA PRESENT   Glucose, capillary     Status: Abnormal   Collection Time: 10/31/16  5:39 AM  Result Value Ref Range   Glucose-Capillary 113 (H) 65 - 99 mg/dL  Basic metabolic panel     Status: Abnormal   Collection Time: 10/31/16  5:55 AM  Result Value Ref Range   Sodium 139 135 - 145 mmol/L   Potassium 3.7 3.5 - 5.1 mmol/L   Chloride 105 101 - 111 mmol/L   CO2 24 22 - 32 mmol/L   Glucose, Bld 130 (H) 65 - 99 mg/dL   BUN 12 6 - 20 mg/dL   Creatinine, Ser 0.90 0.61 - 1.24 mg/dL   Calcium 8.5 (L) 8.9 - 10.3 mg/dL   GFR calc non Af Amer >60 >60 mL/min   GFR calc Af Amer >60 >60 mL/min    Comment: (NOTE) The eGFR has been calculated using the CKD EPI equation. This calculation has not been validated in all clinical situations. eGFR's persistently <60 mL/min signify possible Chronic Kidney Disease.    Anion gap 10 5 - 15  CBC     Status: Abnormal   Collection Time: 10/31/16  5:55 AM  Result Value Ref Range   WBC 9.4 4.0 - 10.5 K/uL   RBC 3.93 (L) 4.22 - 5.81 MIL/uL   Hemoglobin 12.7 (L) 13.0 - 17.0 g/dL  HCT 37.3 (L) 39.0 - 52.0 %   MCV 94.9 78.0 - 100.0 fL   MCH 32.3 26.0 - 34.0 pg   MCHC 34.0 30.0 - 36.0 g/dL   RDW 12.6 11.5 - 15.5 %   Platelets 252 150 - 400 K/uL   Ct Abdomen Pelvis W Contrast  Result Date: 10/31/2016 CLINICAL DATA:  Upper abdominal pain, history of surgery for esophageal cancer EXAM: CT ABDOMEN AND PELVIS WITH CONTRAST TECHNIQUE: Multidetector CT imaging of the abdomen and pelvis was performed using the standard protocol following bolus administration of intravenous contrast. CONTRAST:  100 cc Isovue 300 intravenous COMPARISON:   09/12/2014 FINDINGS: Lower chest: Lung bases demonstrate scarring in the posterior lung bases. No pleural effusion or consolidation. Borderline to mild cardiomegaly. Evidence of prior gastric pull-up surgery. Hepatobiliary: Dilated gallbladder containing multiple stones. No biliary dilatation or focal hepatic abnormality. Pancreas: Unremarkable. No pancreatic ductal dilatation or surrounding inflammatory changes. Spleen: Normal in size without focal abnormality. Adrenals/Urinary Tract: Adrenal glands are within normal limits. Kidneys show no hydronephrosis. Stable hypodense lesions in the left kidney presumably cysts. Bladder unremarkable. Stomach/Bowel: Dilated loops of fluid-filled small bowel measuring up to 3.2 cm within the abdomen and central pelvis with the at least 1 transition point visualized anteriorly at the umbilical level. No colon wall thickening. Normal appendix. Sigmoid colon diverticula without acute inflammation Vascular/Lymphatic: Aortic atherosclerosis. No enlarged abdominal or pelvic lymph nodes. Reproductive: Markedly enlarged prostate with mass effect on the posterior bladder Other: Small amount of free fluid in the pelvis.  No free air. Musculoskeletal: Degenerative changes of the spine. No acute or suspicious bone lesion IMPRESSION: 1. Several loops of fluid-filled dilated small bowel within the abdomen and pelvis with at least 1 transition point visualized within the anterior abdomen ; findings would be consistent with bowel obstruction, probably due to adhesions. 2. Dilated gallbladder with multiple stones. Could correlate with ultrasound if symptoms are suggestive of gallbladder pathology 3. Evidence of prior gastric pull-up surgery. 4. Small amount of free fluid in the pelvis 5. Sigmoid colon diverticular disease without acute inflammation Electronically Signed   By: Donavan Foil M.D.   On: 10/31/2016 02:07     Assessment/Plan PSBO - CT shows dilated loops of fluid-filled SB, gas  and stool in colon - NPO, no NGT present - NGT in this patient should not be placed without radiologic guidance  - patient with +flatus, no n/v, no abdominal pain currently - SB protocol, patient will have to drink gastrogaffin - encourage ambulation, OOB Gallstones - CT shows dilated gallbladder containing multiple stones w/o biliary dilatation  - WBC 9.4 - RUQ not TTP  - can f/u as OP if desired, no surgery indicated at this time Hx of esophageal CA - S/P esophagectomy 2001 HTN - BP high this AM, management per medicine.  OSA EtOH abuse - CIWA  FEN - NPO, IVF. SB protocol VTE - SCDs ID - no current abx  Plan: No immediate surgical intervention required. SB protocol, further recommendations pending results.   LOS: 0  Brigid Re, [facility] Surgery 10/31/2016, 8:27 AM Pager: [phone] Consults: [phone] Mon-Fri 7:00 am-4:30 pm Sat-Sun 7:00 am-11:30 am

## 2016-11-01 ENCOUNTER — Inpatient Hospital Stay (HOSPITAL_COMMUNITY): Payer: Medicare Other

## 2016-11-01 LAB — BASIC METABOLIC PANEL
Anion gap: 8 (ref 5–15)
BUN: 8 mg/dL (ref 6–20)
CO2: 30 mmol/L (ref 22–32)
CREATININE: 0.92 mg/dL (ref 0.61–1.24)
Calcium: 8.6 mg/dL — ABNORMAL LOW (ref 8.9–10.3)
Chloride: 105 mmol/L (ref 101–111)
GFR calc non Af Amer: >60 mL/min (ref >60)
Glucose, Bld: 127 mg/dL — ABNORMAL HIGH (ref 65–99)
Potassium: 3.6 mmol/L (ref 3.5–5.1)
Sodium: 143 mmol/L (ref 135–145)

## 2016-11-01 LAB — GLUCOSE, CAPILLARY
GLUCOSE-CAPILLARY: 123 mg/dL — AB (ref 65–99)
GLUCOSE-CAPILLARY: 93 mg/dL (ref 65–99)
Glucose-Capillary: 133 mg/dL — ABNORMAL HIGH (ref 65–99)
Glucose-Capillary: 133 mg/dL — ABNORMAL HIGH (ref 65–99)
Glucose-Capillary: 122 mg/dL — ABNORMAL HIGH (ref 65–99)
Glucose-Capillary: 124 mg/dL — ABNORMAL HIGH (ref 65–99)

## 2016-11-01 LAB — CBC WITH DIFFERENTIAL/PLATELET
Basophils Absolute: 0 10*3/uL (ref 0.0–0.1)
Basophils Relative: 0 %
Eosinophils Absolute: 0.1 10*3/uL (ref 0.0–0.7)
Eosinophils Relative: 1 %
HEMATOCRIT: 36.5 % — AB (ref 39.0–52.0)
HEMOGLOBIN: 12.3 g/dL — AB (ref 13.0–17.0)
LYMPHS ABS: 1.4 10*3/uL (ref 0.7–4.0)
LYMPHS PCT: 15 %
MCH: 32.2 pg (ref 26.0–34.0)
MCHC: 33.7 g/dL (ref 30.0–36.0)
MCV: 95.5 fL (ref 78.0–100.0)
MONOS PCT: 8 %
Monocytes Absolute: 0.8 10*3/uL (ref 0.1–1.0)
NEUTROS PCT: 76 %
Neutro Abs: 7.2 10*3/uL (ref 1.7–7.7)
Platelets: 267 10*3/uL (ref 150–400)
RBC: 3.82 MIL/uL — ABNORMAL LOW (ref 4.22–5.81)
RDW: 12.4 % (ref 11.5–15.5)
WBC: 9.5 10*3/uL (ref 4.0–10.5)

## 2016-11-01 LAB — MAGNESIUM: Magnesium: 1.5 mg/dL — ABNORMAL LOW (ref 1.7–2.4)

## 2016-11-01 MED ORDER — PANTOPRAZOLE SODIUM 40 MG IV SOLR
40.0000 mg | INTRAVENOUS | Status: DC
  Administered 2016-11-01 – 2016-11-02 (×2): 40 mg via INTRAVENOUS
  Filled 2016-11-01 (×2): qty 40

## 2016-11-01 MED ORDER — MAGNESIUM SULFATE 2 GM/50ML IV SOLN
2.0000 g | Freq: Once | INTRAVENOUS | Status: AC
  Administered 2016-11-01: 2 g via INTRAVENOUS
  Filled 2016-11-01: qty 50

## 2016-11-01 NOTE — Progress Notes (Signed)
[facility] Progress Note     Subjective: CC: heartburn Patient had some mild abdominal pain that was short-lived this AM. Has been ambulating in hall, has had multiple BMs. Passing less flatus he feels like, but denies n/v. Would like to have some clear liquids if XR later this AM is ok. Having some heartburn and would like an antacid.   Objective: Vital signs in last 24 hours: Temp:  [97.6 F (36.4 C)-98.3 F (36.8 C)] 98.3 F (36.8 C) (06/26 0900) Pulse Rate:  [57-69] 57 (06/26 0900) Resp:  [20] 20 (06/26 0900) BP: (149-193)/(59-77) 158/70 (06/26 0900) SpO2:  [97 %-99 %] 99 % (06/26 0900) Last BM Date: 10/31/16  Intake/Output from previous day: 06/25 0701 - 06/26 0700 In: 631.3 [I.V.:631.3] Out: 350 [Urine:350] Intake/Output this shift: Total I/O In: 0  Out: 1 [Stool:1]  PE: Gen:  Alert, NAD, pleasant Card:  Regular rate and rhythm, no M/G/R appreciated Pulm:  Normal effort, clear to auscultation bilaterally Abd: Soft, mild TTP umbilicus, non-distended, bowel sounds present in all 4 quadrants, no HSM, midline surgical scar Skin: warm and dry, no rashes  Psych: A&Ox3   Lab Results:   Recent Labs  10/31/16 0555 11/01/16 0651  WBC 9.4 9.5  HGB 12.7* 12.3*  HCT 37.3* 36.5*  PLT 252 267   BMET  Recent Labs  10/31/16 0555 11/01/16 0651  NA 139 143  K 3.7 3.6  CL 105 105  CO2 24 30  GLUCOSE 130* 127*  BUN 12 8  CREATININE 0.90 0.92  CALCIUM 8.5* 8.6*   CMP     Component Value Date/Time   NA 143 11/01/2016 0651   K 3.6 11/01/2016 0651   CL 105 11/01/2016 0651   CO2 30 11/01/2016 0651   GLUCOSE 127 (H) 11/01/2016 0651   BUN 8 11/01/2016 0651   CREATININE 0.92 11/01/2016 0651   CREATININE 1.13 12/07/2012 1147   CALCIUM 8.6 (L) 11/01/2016 0651   PROT 7.3 10/30/2016 2252   ALBUMIN 4.5 10/30/2016 2252   AST 20 10/30/2016 2252   ALT 15 (L) 10/30/2016 2252   ALKPHOS 70 10/30/2016 2252   BILITOT 0.6 10/30/2016 2252   GFRNONAA >60  11/01/2016 0651   GFRAA >60 11/01/2016 0651   Lipase     Component Value Date/Time   LIPASE 23 10/30/2016 2252     Studies/Results: Ct Abdomen Pelvis W Contrast  Result Date: 10/31/2016 CLINICAL DATA:  Upper abdominal pain, history of surgery for esophageal cancer EXAM: CT ABDOMEN AND PELVIS WITH CONTRAST TECHNIQUE: Multidetector CT imaging of the abdomen and pelvis was performed using the standard protocol following bolus administration of intravenous contrast. CONTRAST:  100 cc Isovue 300 intravenous COMPARISON:  09/12/2014 FINDINGS: Lower chest: Lung bases demonstrate scarring in the posterior lung bases. No pleural effusion or consolidation. Borderline to mild cardiomegaly. Evidence of prior gastric pull-up surgery. Hepatobiliary: Dilated gallbladder containing multiple stones. No biliary dilatation or focal hepatic abnormality. Pancreas: Unremarkable. No pancreatic ductal dilatation or surrounding inflammatory changes. Spleen: Normal in size without focal abnormality. Adrenals/Urinary Tract: Adrenal glands are within normal limits. Kidneys show no hydronephrosis. Stable hypodense lesions in the left kidney presumably cysts. Bladder unremarkable. Stomach/Bowel: Dilated loops of fluid-filled small bowel measuring up to 3.2 cm within the abdomen and central pelvis with the at least 1 transition point visualized anteriorly at the umbilical level. No colon wall thickening. Normal appendix. Sigmoid colon diverticula without acute inflammation Vascular/Lymphatic: Aortic atherosclerosis. No enlarged abdominal or pelvic lymph nodes. Reproductive: Markedly enlarged  prostate with mass effect on the posterior bladder Other: Small amount of free fluid in the pelvis.  No free air. Musculoskeletal: Degenerative changes of the spine. No acute or suspicious bone lesion IMPRESSION: 1. Several loops of fluid-filled dilated small bowel within the abdomen and pelvis with at least 1 transition point visualized within  the anterior abdomen ; findings would be consistent with bowel obstruction, probably due to adhesions. 2. Dilated gallbladder with multiple stones. Could correlate with ultrasound if symptoms are suggestive of gallbladder pathology 3. Evidence of prior gastric pull-up surgery. 4. Small amount of free fluid in the pelvis 5. Sigmoid colon diverticular disease without acute inflammation Electronically Signed   By: Donavan Foil M.D.   On: 10/31/2016 02:07   Dg Abd Portable 1v-small Bowel Obstruction Protocol-initial, 8 Hr Delay  Result Date: 10/31/2016 CLINICAL DATA:  83 year old male with small bowel obstruction. Subsequent encounter. EXAM: PORTABLE ABDOMEN - 1 VIEW COMPARISON:  10/31/2016 CT. FINDINGS: Contrast seen within distal ileum and colon without plain film evidence of small-bowel obstruction. Decrease gas distended small bowel loops when compared to scout view of CT. There may remain small bowel loop with slightly thickened folds. Multiple surgical clips and staples. IMPRESSION: Improved small bowel obstructive pattern when compared to recent CT. Electronically Signed   By: Genia Del M.D.   On: 10/31/2016 19:33     Assessment/Plan PSBO - XR last night showed improved SBO pattern with contrast in colon, 24 hr delay XR later this AM - NPO, no NGT present - NGT in this patient should not be placed without radiologic guidance  - patient with +flatus, no n/v, very mild TTP of abdomen. Patient has had multiple BMs since yesterday.  - SB protocol - encourage ambulation, OOB Gallstones - CT 6/25 shows dilated gallbladder containing multiple stones w/o biliary dilatation  - can f/u as OP if desired, no surgery indicated at this time Hx of esophageal CA - S/P esophagectomy 2001 HTN - BP high this AM, management per medicine.  OSA EtOH abuse - CIWA  FEN - NPO, IVF. SB protocol. IV protonix  VTE - SCDs ID - no current abx  Plan: No immediate surgical intervention required. SB protocol,  further recommendations pending results.   LOS: 1 day    Brigid Re , [facility] 11/01/2016, 9:13 AM Pager: [phone] Consults: [phone] Mon-Fri 7:00 am-4:30 pm Sat-Sun 7:00 am-11:30 am

## 2016-11-01 NOTE — Progress Notes (Signed)
Pt took of CPAP mask, stating he didn't want to wear it.

## 2016-11-01 NOTE — Progress Notes (Signed)
PT Cancellation Note  Patient Details Name: Oscar [name] MRN: [id number] DOB: 09-May-1933   Cancelled Treatment:    Reason Eval/Treat Not Completed: PT screened, no needs identified, will sign off   [facility] 11/01/2016, 1:42 PM

## 2016-11-01 NOTE — Progress Notes (Addendum)
Patient ID: Oscar [name], male   DOB: [birth year], 83 y.o.   MRN: [id number]  PROGRESS NOTE    Oscar [name]  WTU:[id number] DOB: [birth year] DOA: 10/30/2016 PCP: Deland Pretty, MD   Brief Narrative:  83 year old male with history of hypertension, esophageal cancer status post surgery in 2001, sleep apnea presented with complaints of abdominal pain. He was found to have small bowel obstruction; surgery was consulted  Assessment & Plan:   Principal Problem:   SBO (small bowel obstruction) (HCC) Active Problems:   HTN (hypertension)  1. Small bowel obstruction - unclear if this is related to adhesions; currently managed conservatively; patient started having bowel movements. I spoke to general surgery PA, he'll be started on clear liquid diet today if abdomen x-ray shows improvement as per general surgery recommendations. IV analgesics PRN 2. Gallstones - outpatient follow-up with general surgery  3. Hypertension - monitor. Use when necessary IV metoprolol and/or hydralazine 4. History of sleep apnea on CPAP. 5. History of aortic insufficiency and diastolic dysfunction - appears to be compensated. 6. Question of alcohol abuse: No signs of alcohol withdrawal 7. Hypomagnesemia: Replace and repeat a.m. labs   DVT prophylaxis:SCDs. Code Status:Full code. Family Communication: none present at bedside Disposition Plan:Home in 1-2 days once cleared by general surgery Procedures: None  Antimicrobials: None   Subjective: Patient seen and examined at bedside. He complains of abdominal soreness but is having some bowel movements. No overnight vomiting or fever.  Objective: Vitals:   10/31/16 2001 10/31/16 2216 11/01/16 0538 11/01/16 0900  BP: (!) 193/77  (!) 149/59 (!) 158/70  Pulse: (!) 57 61 69 (!) 57  Resp: 20 20 20 20   Temp: 97.6 F (36.4 C)  97.6 F (36.4 C) 98.3 F (36.8 C)  TempSrc: Oral  Oral Oral  SpO2: 98% 97% 99% 99%  Weight:      Height:        Intake/Output  Summary (Last 24 hours) at 11/01/16 1019 Last data filed at 11/01/16 0956  Gross per 24 hour  Intake           631.25 ml  Output              353 ml  Net           278.25 ml   Filed Weights   10/30/16 2225 10/31/16 0520  Weight: 90.7 kg (200 lb) 91.9 kg (202 lb 9.6 oz)    Examination:  General exam: Appears calm and comfortable  Respiratory system: Bilateral decreased breath sound at bases Cardiovascular system: S1 & S2 heard, Intermittent bradycardia  Gastrointestinal system: Abdomen is nondistended, soft and mildly tender in periumbilical region. Bowel sounds sluggish Extremities: No cyanosis, clubbing, edema    Data Reviewed: I have personally reviewed following labs and imaging studies  CBC:  Recent Labs Lab 10/30/16 2252 10/31/16 0555 11/01/16 0651  WBC 12.2* 9.4 9.5  NEUTROABS  --   --  7.2  HGB 14.0 12.7* 12.3*  HCT 40.7 37.3* 36.5*  MCV 94.7 94.9 95.5  PLT 303 252 179   Basic Metabolic Panel:  Recent Labs Lab 10/30/16 2252 10/31/16 0555 11/01/16 0651  NA 137 139 143  K 4.0 3.7 3.6  CL 101 105 105  CO2 26 24 30   GLUCOSE 105* 130* 127*  BUN 15 12 8   CREATININE 1.11 0.90 0.92  CALCIUM 9.2 8.5* 8.6*  MG  --   --  1.5*   GFR: Estimated Creatinine Clearance: 71.2 mL/min (by C-G formula  based on SCr of 0.92 mg/dL). Liver Function Tests:  Recent Labs Lab 10/30/16 2252  AST 20  ALT 15*  ALKPHOS 70  BILITOT 0.6  PROT 7.3  ALBUMIN 4.5    Recent Labs Lab 10/30/16 2252  LIPASE 23   No results for input(s): AMMONIA in the last 168 hours. Coagulation Profile: No results for input(s): INR, PROTIME in the last 168 hours. Cardiac Enzymes: No results for input(s): CKTOTAL, CKMB, CKMBINDEX, TROPONINI in the last 168 hours. BNP (last 3 results) No results for input(s): PROBNP in the last 8760 hours. HbA1C: No results for input(s): HGBA1C in the last 72 hours. CBG:  Recent Labs Lab 10/31/16 1600 10/31/16 1957 11/01/16 0047 11/01/16 0534  11/01/16 0725  GLUCAP 116* 96 123* 133* 133*   Lipid Profile: No results for input(s): CHOL, HDL, LDLCALC, TRIG, CHOLHDL, LDLDIRECT in the last 72 hours. Thyroid Function Tests: No results for input(s): TSH, T4TOTAL, FREET4, T3FREE, THYROIDAB in the last 72 hours. Anemia Panel: No results for input(s): VITAMINB12, FOLATE, FERRITIN, TIBC, IRON, RETICCTPCT in the last 72 hours. Sepsis Labs: No results for input(s): PROCALCITON, LATICACIDVEN in the last 168 hours.  No results found for this or any previous visit (from the past 240 hour(s)).       Radiology Studies: Ct Abdomen Pelvis W Contrast  Result Date: 10/31/2016 CLINICAL DATA:  Upper abdominal pain, history of surgery for esophageal cancer EXAM: CT ABDOMEN AND PELVIS WITH CONTRAST TECHNIQUE: Multidetector CT imaging of the abdomen and pelvis was performed using the standard protocol following bolus administration of intravenous contrast. CONTRAST:  100 cc Isovue 300 intravenous COMPARISON:  09/12/2014 FINDINGS: Lower chest: Lung bases demonstrate scarring in the posterior lung bases. No pleural effusion or consolidation. Borderline to mild cardiomegaly. Evidence of prior gastric pull-up surgery. Hepatobiliary: Dilated gallbladder containing multiple stones. No biliary dilatation or focal hepatic abnormality. Pancreas: Unremarkable. No pancreatic ductal dilatation or surrounding inflammatory changes. Spleen: Normal in size without focal abnormality. Adrenals/Urinary Tract: Adrenal glands are within normal limits. Kidneys show no hydronephrosis. Stable hypodense lesions in the left kidney presumably cysts. Bladder unremarkable. Stomach/Bowel: Dilated loops of fluid-filled small bowel measuring up to 3.2 cm within the abdomen and central pelvis with the at least 1 transition point visualized anteriorly at the umbilical level. No colon wall thickening. Normal appendix. Sigmoid colon diverticula without acute inflammation Vascular/Lymphatic:  Aortic atherosclerosis. No enlarged abdominal or pelvic lymph nodes. Reproductive: Markedly enlarged prostate with mass effect on the posterior bladder Other: Small amount of free fluid in the pelvis.  No free air. Musculoskeletal: Degenerative changes of the spine. No acute or suspicious bone lesion IMPRESSION: 1. Several loops of fluid-filled dilated small bowel within the abdomen and pelvis with at least 1 transition point visualized within the anterior abdomen ; findings would be consistent with bowel obstruction, probably due to adhesions. 2. Dilated gallbladder with multiple stones. Could correlate with ultrasound if symptoms are suggestive of gallbladder pathology 3. Evidence of prior gastric pull-up surgery. 4. Small amount of free fluid in the pelvis 5. Sigmoid colon diverticular disease without acute inflammation Electronically Signed   By: Donavan Foil M.D.   On: 10/31/2016 02:07   Dg Abd Portable 1v-small Bowel Obstruction Protocol-initial, 8 Hr Delay  Result Date: 10/31/2016 CLINICAL DATA:  83 year old male with small bowel obstruction. Subsequent encounter. EXAM: PORTABLE ABDOMEN - 1 VIEW COMPARISON:  10/31/2016 CT. FINDINGS: Contrast seen within distal ileum and colon without plain film evidence of small-bowel obstruction. Decrease gas distended small bowel loops  when compared to scout view of CT. There may remain small bowel loop with slightly thickened folds. Multiple surgical clips and staples. IMPRESSION: Improved small bowel obstructive pattern when compared to recent CT. Electronically Signed   By: Genia Del M.D.   On: 10/31/2016 19:33        Scheduled Meds: . folic acid  1 mg Oral Daily  . metoprolol tartrate  2.5 mg Intravenous Q6H  . multivitamin with minerals  1 tablet Oral Daily  . pantoprazole (PROTONIX) IV  40 mg Intravenous Q24H  . thiamine  100 mg Oral Daily   Or  . thiamine  100 mg Intravenous Daily   Continuous Infusions:   LOS: 1 day        Aline August, MD [facility] Pager [phone]  If 7PM-7AM, please contact night-coverage [email or web address] Password [facility] 11/01/2016, 10:19 AM

## 2016-11-02 LAB — CBC WITH DIFFERENTIAL/PLATELET
Basophils Absolute: 0 10*3/uL (ref 0.0–0.1)
Basophils Relative: 0 %
EOS ABS: 0.2 10*3/uL (ref 0.0–0.7)
EOS PCT: 2 %
HCT: 35.6 % — ABNORMAL LOW (ref 39.0–52.0)
Hemoglobin: 12.2 g/dL — ABNORMAL LOW (ref 13.0–17.0)
LYMPHS PCT: 25 %
Lymphs Abs: 2.2 10*3/uL (ref 0.7–4.0)
MCH: 32.1 pg (ref 26.0–34.0)
MCHC: 34.3 g/dL (ref 30.0–36.0)
MCV: 93.7 fL (ref 78.0–100.0)
MONO ABS: 0.7 10*3/uL (ref 0.1–1.0)
MONOS PCT: 8 %
Neutro Abs: 5.5 10*3/uL (ref 1.7–7.7)
Neutrophils Relative %: 65 %
PLATELETS: 242 10*3/uL (ref 150–400)
RBC: 3.8 MIL/uL — AB (ref 4.22–5.81)
RDW: 12.3 % (ref 11.5–15.5)
WBC: 8.5 10*3/uL (ref 4.0–10.5)

## 2016-11-02 LAB — BASIC METABOLIC PANEL
Anion gap: 8 (ref 5–15)
BUN: 7 mg/dL (ref 6–20)
CO2: 24 mmol/L (ref 22–32)
CREATININE: 0.81 mg/dL (ref 0.61–1.24)
Calcium: 8.4 mg/dL — ABNORMAL LOW (ref 8.9–10.3)
Chloride: 105 mmol/L (ref 101–111)
GFR calc Af Amer: >60 mL/min (ref >60)
GLUCOSE: 94 mg/dL (ref 65–99)
POTASSIUM: 3.6 mmol/L (ref 3.5–5.1)
SODIUM: 137 mmol/L (ref 135–145)

## 2016-11-02 LAB — GLUCOSE, CAPILLARY: Glucose-Capillary: 98 mg/dL (ref 65–99)

## 2016-11-02 LAB — MAGNESIUM: MAGNESIUM: 1.7 mg/dL (ref 1.7–2.4)

## 2016-11-02 MED ORDER — SENNOSIDES-DOCUSATE SODIUM 8.6-50 MG PO TABS: 1.0000 | ORAL_TABLET | Freq: Every day | ORAL | 0 refills | Status: DC

## 2016-11-02 MED ORDER — LOSARTAN POTASSIUM 50 MG PO TABS
100.0000 mg | ORAL_TABLET | Freq: Every day | ORAL | Status: DC
  Administered 2016-11-02: 100 mg via ORAL
  Filled 2016-11-02: qty 2

## 2016-11-02 MED ORDER — MAGNESIUM SULFATE IN D5W 1-5 GM/100ML-% IV SOLN
1.0000 g | Freq: Once | INTRAVENOUS | Status: AC
  Administered 2016-11-02: 1 g via INTRAVENOUS
  Filled 2016-11-02: qty 100

## 2016-11-02 MED ORDER — POTASSIUM CHLORIDE CRYS ER 20 MEQ PO TBCR
40.0000 meq | EXTENDED_RELEASE_TABLET | Freq: Once | ORAL | Status: AC
  Administered 2016-11-02: 40 meq via ORAL
  Filled 2016-11-02: qty 2

## 2016-11-02 MED ORDER — MAGNESIUM OXIDE -MG SUPPLEMENT 400 (240 MG) MG PO TABS: 30.0000 | ORAL_TABLET | Freq: Every day | ORAL | 0 refills | Status: DC

## 2016-11-02 MED ORDER — LOSARTAN POTASSIUM 100 MG PO TABS: 50.0000 mg | ORAL_TABLET | Freq: Every day | ORAL | 0 refills | Status: DC

## 2016-11-02 NOTE — Progress Notes (Signed)
Pt ate dinner with no n/v or discomfort. Pts IV was removed with a clean and dry dressing intact. Pt denies pain at the time of discharge with no s/s of distress noted.

## 2016-11-02 NOTE — Care Management Note (Signed)
Case Management Note  Patient Details  Name: Oscar [name] MRN: [id number] Date of Birth: [birth year]  Subjective/Objective:  83 y/o m admitted w/PSBO. From home. No CM needs.                  Action/Plan:d/c home.   Expected Discharge Date:  11/02/16               Expected Discharge Plan:  Home/Self Care  In-House Referral:     Discharge planning Services  CM Consult  Post Acute Care Choice:    Choice offered to:     DME Arranged:    DME Agency:     HH Arranged:    HH Agency:     Status of Service:  Completed, signed off  If discussed at [employer] of Stay Meetings, dates discussed:    Additional Comments:  Dessa Phi, RN 11/02/2016, 12:25 PM

## 2016-11-02 NOTE — Discharge Summary (Signed)
Discharge Summary  RAYNOR CALCATERRA JHE:[id number] DOB: 1933-02-16  PCP: Deland Pretty, MD  Admit date: 10/30/2016 Discharge date: 11/02/2016  Time spent: <66mins  Recommendations for Outpatient Follow-up:  1. F/u with PMD within a week  for hospital discharge follow up, repeat cbc/bmp at follow up 2. Follow up with general surgery for gallstones  Discharge Diagnoses:  Active Hospital Problems   Diagnosis Date Noted  . SBO (small bowel obstruction) ([facility]) 10/31/2016  . HTN (hypertension) 07/09/2013    Resolved Hospital Problems   Diagnosis Date Noted Date Resolved  No resolved problems to display.    Discharge Condition: stable  Diet recommendation: heart healthy  Filed Weights   10/30/16 2225 10/31/16 0520  Weight: 90.7 kg (200 lb) 91.9 kg (202 lb 9.6 oz)    History of present illness:  PCP: Deland Pretty, MD  Patient coming from: Home.  Chief Complaint: Abdominal pain.  HPI: Oscar [name] is a 83 y.o. male with history of hypertension, esophageal cancer status post surgery in 2001, sleep apnea presents to the ER with complaints of persistent abdominal pain since yesterday morning. Denies any vomiting. Last bowel movement was last evening. Pain is mostly in the epigastric area sharp spreading throughout the abdomen at times severe. Denies any fever or chills.   ED Course: In the ER CT of the abdomen and pelvis done shows features concerning for small bowel obstruction with transition point. There is also gallstones. Dr. Excell Seltzer on-call general surgeon has been consulted. Unable to place NG tube.  Hospital Course:  Principal Problem:   SBO (small bowel obstruction) (HCC) Active Problems:   HTN (hypertension)   Small bowel obstruction - possbile related to adhesions; managed conservatively; patient started having bowel movements.ab x ray with Passage of most of the oral contrast previously noted colon. He is tolerating diet advancement, general surgery cleared  patient. Stool softener provided at discharge.  Hypokalemia/hypomagnesemia: replace k/mag, d/c hctz.  Gallstones - asymptomatic,lft unremarkable.  outpatient follow-up with general surgery   Hypertension - oral meds held initially, resumed at discharge, continue losartan, d/c hctz.   History of sleep apnea on CPAP.  History of aortic insufficiency and diastolic dysfunction - appears to be compensated. Question of alcohol abuse: No signs of alcohol withdrawal  H/o esophageal cancer s/p partial resection in 2001, followed by gi, last surveillance egd in 2015.   DVT prophylaxis:SCDs. Code Status:Full code. Family Communication:none present at bedside Disposition Plan:Home with general surgery Procedures:None  Antimicrobials: None   Discharge Exam: BP (!) 160/80 (BP Location: Right Arm)   Pulse 61   Temp 97.9 F (36.6 C) (Oral)   Resp 18   Ht 6\' 1"  (1.854 m)   Wt 91.9 kg (202 lb 9.6 oz)   SpO2 100%   BMI 26.73 kg/m   General: NAD Cardiovascular: RRR Respiratory: CTABL Ab: soft, nontender, +bs  Discharge Instructions You were cared for by a hospitalist during your hospital stay. If you have any questions about your discharge medications or the care you received while you were in the hospital after you are discharged, you can call the unit and asked to speak with the hospitalist on call if the hospitalist that took care of you is not available. Once you are discharged, your primary care physician will handle any further medical issues. Please note that NO REFILLS for any discharge medications will be authorized once you are discharged, as it is imperative that you return to your primary care physician (or establish a relationship with a  primary care physician if you do not have one) for your aftercare needs so that they can reassess your need for medications and monitor your lab values.  Discharge Instructions    Diet - low sodium heart healthy    Complete by:  As  directed    Increase activity slowly    Complete by:  As directed      Allergies as of 11/02/2016      Reactions   Bee Venom       Medication List    STOP taking these medications   losartan-hydrochlorothiazide 100-12.5 MG tablet Commonly known as:  HYZAAR   losartan-hydrochlorothiazide 100-25 MG tablet Commonly known as:  HYZAAR     TAKE these medications   atenolol 25 MG tablet Commonly known as:  TENORMIN Take 1 tablet (25 mg total) by mouth daily. What changed:  when to take this   HYDROcodone-acetaminophen 5-325 MG tablet Commonly known as:  NORCO/VICODIN Take 1 tablet by mouth every 6 (six) hours as needed for moderate pain.   losartan 100 MG tablet Commonly known as:  COZAAR Take 0.5 tablets (50 mg total) by mouth daily. Start taking on:  11/03/2016   Magnesium Oxide 400 (240 Mg) MG Tabs Take 30 tablets (12,000 mg total) by mouth daily at 2 PM.   omeprazole 20 MG capsule Commonly known as:  PRILOSEC Take 40 mg by mouth 2 (two) times daily before a meal.   SAW PALMETTO PO Take 2 capsules by mouth daily.   senna-docusate 8.6-50 MG tablet Commonly known as:  Senokot-S Take 1 tablet by mouth at bedtime.   traMADol 50 MG tablet Commonly known as:  ULTRAM Take 50-100 mg by mouth 3 (three) times daily as needed for moderate pain or severe pain.   vitamin B-12 1000 MCG tablet Commonly known as:  CYANOCOBALAMIN Take 1,000 mcg by mouth daily.      Allergies  Allergen Reactions  . Bee Venom    Follow-up Information    Deland Pretty, MD Follow up in 1 week(s).   Specialty:  Internal Medicine Why:  hospital discharge follow up, repeat cbc/bmp at follow up. Contact information: [address] [place] [address] [phone]        Brigid Re A, PA-C Follow up.   Specialty:  General Surgery Why:  gallstone Contact information: [place] [address] [phone]            The results of  significant diagnostics from this hospitalization (including imaging, microbiology, ancillary and laboratory) are listed below for reference.    Significant Diagnostic Studies: Ct Abdomen Pelvis W Contrast  Result Date: 10/31/2016 CLINICAL DATA:  Upper abdominal pain, history of surgery for esophageal cancer EXAM: CT ABDOMEN AND PELVIS WITH CONTRAST TECHNIQUE: Multidetector CT imaging of the abdomen and pelvis was performed using the standard protocol following bolus administration of intravenous contrast. CONTRAST:  100 cc Isovue 300 intravenous COMPARISON:  09/12/2014 FINDINGS: Lower chest: Lung bases demonstrate scarring in the posterior lung bases. No pleural effusion or consolidation. Borderline to mild cardiomegaly. Evidence of prior gastric pull-up surgery. Hepatobiliary: Dilated gallbladder containing multiple stones. No biliary dilatation or focal hepatic abnormality. Pancreas: Unremarkable. No pancreatic ductal dilatation or surrounding inflammatory changes. Spleen: Normal in size without focal abnormality. Adrenals/Urinary Tract: Adrenal glands are within normal limits. Kidneys show no hydronephrosis. Stable hypodense lesions in the left kidney presumably cysts. Bladder unremarkable. Stomach/Bowel: Dilated loops of fluid-filled small bowel measuring up to 3.2 cm  within the abdomen and central pelvis with the at least 1 transition point visualized anteriorly at the umbilical level. No colon wall thickening. Normal appendix. Sigmoid colon diverticula without acute inflammation Vascular/Lymphatic: Aortic atherosclerosis. No enlarged abdominal or pelvic lymph nodes. Reproductive: Markedly enlarged prostate with mass effect on the posterior bladder Other: Small amount of free fluid in the pelvis.  No free air. Musculoskeletal: Degenerative changes of the spine. No acute or suspicious bone lesion IMPRESSION: 1. Several loops of fluid-filled dilated small bowel within the abdomen and pelvis with at least 1  transition point visualized within the anterior abdomen ; findings would be consistent with bowel obstruction, probably due to adhesions. 2. Dilated gallbladder with multiple stones. Could correlate with ultrasound if symptoms are suggestive of gallbladder pathology 3. Evidence of prior gastric pull-up surgery. 4. Small amount of free fluid in the pelvis 5. Sigmoid colon diverticular disease without acute inflammation Electronically Signed   By: Donavan Foil M.D.   On: 10/31/2016 02:07   Dg Abd Portable 1v-small Bowel Obstruction Protocol-24 Hr Delay  Result Date: 11/01/2016 CLINICAL DATA:  Loose stools. EXAM: PORTABLE ABDOMEN - 1 VIEW COMPARISON:  10/31/2016 .  CT 10/31/2016. FINDINGS: Soft structures are unremarkable. Dilated loops of small bowel noted with interim progression from prior exam. Passage of most of the oral contrast previously noted colon. No free air. Pelvic calcifications consistent with phleboliths . Degenerative changes lumbar spine. IMPRESSION: Interim progression of dilatation of small bowel loops from prior exam. Findings suggest small bowel obstruction. Electronically Signed   By: Marcello Moores  Register   On: 11/01/2016 11:33   Dg Abd Portable 1v-small Bowel Obstruction Protocol-initial, 8 Hr Delay  Result Date: 10/31/2016 CLINICAL DATA:  83 year old male with small bowel obstruction. Subsequent encounter. EXAM: PORTABLE ABDOMEN - 1 VIEW COMPARISON:  10/31/2016 CT. FINDINGS: Contrast seen within distal ileum and colon without plain film evidence of small-bowel obstruction. Decrease gas distended small bowel loops when compared to scout view of CT. There may remain small bowel loop with slightly thickened folds. Multiple surgical clips and staples. IMPRESSION: Improved small bowel obstructive pattern when compared to recent CT. Electronically Signed   By: Genia Del M.D.   On: 10/31/2016 19:33    Microbiology: No results found for this or any previous visit (from the past 240  hour(s)).   Labs: Basic Metabolic Panel:  Recent Labs Lab 10/30/16 2252 10/31/16 0555 11/01/16 0651 11/02/16 0521  NA 137 139 143 137  K 4.0 3.7 3.6 3.6  CL 101 105 105 105  CO2 26 24 30 24   GLUCOSE 105* 130* 127* 94  BUN 15 12 8 7   CREATININE 1.11 0.90 0.92 0.81  CALCIUM 9.2 8.5* 8.6* 8.4*  MG  --   --  1.5* 1.7   Liver Function Tests:  Recent Labs Lab 10/30/16 2252  AST 20  ALT 15*  ALKPHOS 70  BILITOT 0.6  PROT 7.3  ALBUMIN 4.5    Recent Labs Lab 10/30/16 2252  LIPASE 23   No results for input(s): AMMONIA in the last 168 hours. CBC:  Recent Labs Lab 10/30/16 2252 10/31/16 0555 11/01/16 0651 11/02/16 0521  WBC 12.2* 9.4 9.5 8.5  NEUTROABS  --   --  7.2 5.5  HGB 14.0 12.7* 12.3* 12.2*  HCT 40.7 37.3* 36.5* 35.6*  MCV 94.7 94.9 95.5 93.7  PLT 303 252 267 242   Cardiac Enzymes: No results for input(s): CKTOTAL, CKMB, CKMBINDEX, TROPONINI in the last 168 hours. BNP: BNP (last 3 results)  No results for input(s): BNP in the last 8760 hours.  ProBNP (last 3 results) No results for input(s): PROBNP in the last 8760 hours.  CBG:  Recent Labs Lab 11/01/16 0725 11/01/16 1210 11/01/16 1720 11/01/16 2014 11/02/16 0526  GLUCAP 133* 93 122* 124* 98       Signed:  Andy Allende MD, PhD  [facility] 11/02/2016, 3:09 PM

## 2016-11-02 NOTE — Final Consult Note (Signed)
[facility] Progress Note     Subjective: CC: Wants to get home Feels hungry, and ready to get home. Having liquid BMs, slightly more solid this AM. Denies pain, n/v. Tolerating clears.  BP up this AM.   Objective: Vital signs in last 24 hours: Temp:  [97.8 F (36.6 C)-98.3 F (36.8 C)] 98.1 F (36.7 C) (06/27 0529) Pulse Rate:  [57-65] 62 (06/27 0529) Resp:  [20] 20 (06/27 0529) BP: (158-177)/(70-82) 177/82 (06/27 0529) SpO2:  [97 %-100 %] 97 % (06/27 0529) Last BM Date: 11/01/16  Intake/Output from previous day: 06/26 0701 - 06/27 0700 In: 360 [P.O.:360] Out: 201 [Urine:200; Stool:1] Intake/Output this shift: No intake/output data recorded.  PE: Gen:  Alert, NAD, pleasant Card:  Regular rate and rhythm, no M/G/R appreciated Pulm:  Normal effort, clear to auscultation bilaterally Abd: Soft, non-tender, non-distended, bowel sounds present in all 4 quadrants, no HSM. Skin: warm and dry, no rashes  Psych: A&Ox3   Lab Results:   Recent Labs  11/01/16 0651 11/02/16 0521  WBC 9.5 8.5  HGB 12.3* 12.2*  HCT 36.5* 35.6*  PLT 267 242   BMET  Recent Labs  11/01/16 0651 11/02/16 0521  NA 143 137  K 3.6 3.6  CL 105 105  CO2 30 24  GLUCOSE 127* 94  BUN 8 7  CREATININE 0.92 0.81  CALCIUM 8.6* 8.4*    CMP     Component Value Date/Time   NA 137 11/02/2016 0521   K 3.6 11/02/2016 0521   CL 105 11/02/2016 0521   CO2 24 11/02/2016 0521   GLUCOSE 94 11/02/2016 0521   BUN 7 11/02/2016 0521   CREATININE 0.81 11/02/2016 0521   CREATININE 1.13 12/07/2012 1147   CALCIUM 8.4 (L) 11/02/2016 0521   PROT 7.3 10/30/2016 2252   ALBUMIN 4.5 10/30/2016 2252   AST 20 10/30/2016 2252   ALT 15 (L) 10/30/2016 2252   ALKPHOS 70 10/30/2016 2252   BILITOT 0.6 10/30/2016 2252   GFRNONAA >60 11/02/2016 0521   GFRAA >60 11/02/2016 0521   Lipase     Component Value Date/Time   LIPASE 23 10/30/2016 2252     Studies/Results: Dg Abd Portable 1v-small Bowel  Obstruction Protocol-24 Hr Delay  Result Date: 11/01/2016 CLINICAL DATA:  Loose stools. EXAM: PORTABLE ABDOMEN - 1 VIEW COMPARISON:  10/31/2016 .  CT 10/31/2016. FINDINGS: Soft structures are unremarkable. Dilated loops of small bowel noted with interim progression from prior exam. Passage of most of the oral contrast previously noted colon. No free air. Pelvic calcifications consistent with phleboliths . Degenerative changes lumbar spine. IMPRESSION: Interim progression of dilatation of small bowel loops from prior exam. Findings suggest small bowel obstruction. Electronically Signed   By: Marcello Moores  Register   On: 11/01/2016 11:33   Dg Abd Portable 1v-small Bowel Obstruction Protocol-initial, 8 Hr Delay  Result Date: 10/31/2016 CLINICAL DATA:  83 year old male with small bowel obstruction. Subsequent encounter. EXAM: PORTABLE ABDOMEN - 1 VIEW COMPARISON:  10/31/2016 CT. FINDINGS: Contrast seen within distal ileum and colon without plain film evidence of small-bowel obstruction. Decrease gas distended small bowel loops when compared to scout view of CT. There may remain small bowel loop with slightly thickened folds. Multiple surgical clips and staples. IMPRESSION: Improved small bowel obstructive pattern when compared to recent CT. Electronically Signed   By: Genia Del M.D.   On: 10/31/2016 19:33     Assessment/Plan PSBO - clear liquids tolerated, advanced to fulls. If tolerating fulls, can advance to soft  at dinner.  - patient with +flatus and BMs, no n/v, no TTP of abdomen.  - encourage ambulation, OOB - would recommend patient takes stool softener daily at home and gentle laxatives PRN.  Gallstones - CT 6/25 shows dilated gallbladder containing multiple stones w/o biliary dilatation  - can f/u as OP if desired, no surgery indicated at this time Hx of esophageal CA - S/P esophagectomy 2001 HTN- BP high this AM, management per medicine.  OSA EtOH abuse- CIWA  FEN - advanced diet to  full liquids, then advance as tolerated.   VTE- SCDs ID- no current abx  Plan: Advance diet as tolerated. We will sign off, but remain available for questions.   LOS: 2 days    Brigid Re , [facility] Surgery 11/02/2016, 8:53 AM Pager: [phone] Consults: [phone] Mon-Fri 7:00 am-4:30 pm Sat-Sun 7:00 am-11:30 am

## 2016-11-10 DIAGNOSIS — E119 Type 2 diabetes mellitus without complications: Secondary | ICD-10-CM | POA: Diagnosis not present

## 2016-11-10 DIAGNOSIS — Z09 Encounter for follow-up examination after completed treatment for conditions other than malignant neoplasm: Secondary | ICD-10-CM | POA: Diagnosis not present

## 2016-11-10 DIAGNOSIS — I10 Essential (primary) hypertension: Secondary | ICD-10-CM | POA: Diagnosis not present

## 2016-11-10 DIAGNOSIS — E7800 Pure hypercholesterolemia, unspecified: Secondary | ICD-10-CM | POA: Diagnosis not present

## 2016-11-14 DIAGNOSIS — R0602 Shortness of breath: Secondary | ICD-10-CM | POA: Diagnosis not present

## 2016-11-14 DIAGNOSIS — G4733 Obstructive sleep apnea (adult) (pediatric): Secondary | ICD-10-CM | POA: Diagnosis not present

## 2016-11-23 DIAGNOSIS — G4733 Obstructive sleep apnea (adult) (pediatric): Secondary | ICD-10-CM | POA: Diagnosis not present

## 2016-11-24 DIAGNOSIS — H25812 Combined forms of age-related cataract, left eye: Secondary | ICD-10-CM | POA: Diagnosis not present

## 2016-11-24 DIAGNOSIS — H04123 Dry eye syndrome of bilateral lacrimal glands: Secondary | ICD-10-CM | POA: Diagnosis not present

## 2016-11-24 DIAGNOSIS — Z961 Presence of intraocular lens: Secondary | ICD-10-CM | POA: Diagnosis not present

## 2016-11-24 DIAGNOSIS — H40013 Open angle with borderline findings, low risk, bilateral: Secondary | ICD-10-CM | POA: Diagnosis not present

## 2016-11-24 DIAGNOSIS — E119 Type 2 diabetes mellitus without complications: Secondary | ICD-10-CM | POA: Diagnosis not present

## 2016-11-29 DIAGNOSIS — G4733 Obstructive sleep apnea (adult) (pediatric): Secondary | ICD-10-CM | POA: Diagnosis not present

## 2016-12-30 DIAGNOSIS — G4733 Obstructive sleep apnea (adult) (pediatric): Secondary | ICD-10-CM | POA: Diagnosis not present

## 2017-01-03 DIAGNOSIS — M5136 Other intervertebral disc degeneration, lumbar region: Secondary | ICD-10-CM | POA: Diagnosis not present

## 2017-01-03 DIAGNOSIS — M9904 Segmental and somatic dysfunction of sacral region: Secondary | ICD-10-CM | POA: Diagnosis not present

## 2017-01-03 DIAGNOSIS — M9905 Segmental and somatic dysfunction of pelvic region: Secondary | ICD-10-CM | POA: Diagnosis not present

## 2017-01-03 DIAGNOSIS — M9903 Segmental and somatic dysfunction of lumbar region: Secondary | ICD-10-CM | POA: Diagnosis not present

## 2017-01-04 DIAGNOSIS — G4733 Obstructive sleep apnea (adult) (pediatric): Secondary | ICD-10-CM | POA: Diagnosis not present

## 2017-01-04 DIAGNOSIS — R0602 Shortness of breath: Secondary | ICD-10-CM | POA: Diagnosis not present

## 2017-01-11 ENCOUNTER — Ambulatory Visit (INDEPENDENT_AMBULATORY_CARE_PROVIDER_SITE_OTHER): Payer: Medicare Other | Admitting: Orthopedic Surgery

## 2017-01-11 ENCOUNTER — Ambulatory Visit (INDEPENDENT_AMBULATORY_CARE_PROVIDER_SITE_OTHER): Payer: Self-pay

## 2017-01-11 ENCOUNTER — Encounter (INDEPENDENT_AMBULATORY_CARE_PROVIDER_SITE_OTHER): Payer: Self-pay | Admitting: Orthopedic Surgery

## 2017-01-11 DIAGNOSIS — G8929 Other chronic pain: Secondary | ICD-10-CM | POA: Diagnosis not present

## 2017-01-11 DIAGNOSIS — M4807 Spinal stenosis, lumbosacral region: Secondary | ICD-10-CM | POA: Diagnosis not present

## 2017-01-11 DIAGNOSIS — M5442 Lumbago with sciatica, left side: Secondary | ICD-10-CM | POA: Diagnosis not present

## 2017-01-11 MED ORDER — METHYLPREDNISOLONE 4 MG PO TABS: ORAL_TABLET | ORAL | 0 refills | Status: DC

## 2017-01-11 NOTE — Progress Notes (Signed)
Office Visit Note   Patient: Oscar [name]           Date of Birth: April 27, 1933           MRN: [id number] Visit Date: 01/11/2017 Requested by: Deland Pretty, MD [address] [place], [place] [address] PCP: Deland Pretty, MD  Subjective: Chief Complaint  Patient presents with  . Lower Back - Pain    HPI: Oscar [name] is an 83 year old patient with back pain.  He would like to get an injection is back.  Last injection was in 2006.  MRI of the lumbar spine at that time demonstrated facet arthropathy at L4-5 and L5-S1.  He does along business in real estate.  He uses a cane for ambulation.  States that he has chronic back pain and it's getting worse.  Does describe leg pain left worse than right along with numbness and tingling.  He goes to the chiropractor but does not get much relief there.  He does use a cane for ambulation.              ROS: All systems reviewed are negative as they relate to the chief complaint within the history of present illness.  Patient denies  fevers or chills.   Assessment & Plan: Visit Diagnoses:  1. Chronic midline low back pain with left-sided sciatica   2. Spinal stenosis of lumbosacral region     Plan: impression is symptomatic facet arthritis lumbar spine.  Plan is to try a 6 day Medrol DoSEPAK FOR SYMPTOM RELIEF>  He has been taking tramadol and hydrocodone for the problem.  needs MRI lumbar spine prior to injections in the back.  I will order that scan and have him follow up with Dr. Ernestina Patches for back injections.  Follow-Up Instructions: No Follow-up on file.   Orders:  Orders Placed This Encounter  Procedures  . XR Lumbar Spine 2-3 Views  . MR Lumbar Spine w/o contrast   Meds ordered this encounter  Medications  . methylPREDNISolone (MEDROL) 4 MG tablet    Sig: Take dosepak as directed.    Dispense:  21 tablet    Refill:  0      Procedures: No procedures performed   Clinical Data: No additional  findings.  Objective: Vital Signs: There were no vitals taken for this visit.  Physical Exam:   Constitutional: Patient appears well-developed HEENT:  Head: Normocephalic Eyes:EOM are normal Neck: Normal range of motion Cardiovascular: Normal rate Pulmonary/chest: Effort normal Neurologic: Patient is alert Skin: Skin is warm Psychiatric: Patient has normal mood and affect    Ortho Exam: orthopedic exam demonstrates pretty normal gait and alignment palpable pedal pulses no nerve root tension signs not much in the way of pain with forward flexion  extension.  No trochanteric TENDERNESS IS NOTED>  No PARESTHESIAS L1 S1 bilaterally  No groin pain with internal/external rotation of either leg.  Specialty Comments:  No specialty comments available.  Imaging: Xr Lumbar Spine 2-3 Views  Result Date: 01/11/2017 AP lateral lumbar spine reviewed.  Hip joints without arthritis.  Surgical clips noted left upper quadrant of the abdomen.  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  Calcification of the aorta is noted.  No spondylolisthesis or compression fractures present.    PMFS History: Patient Active Problem List   Diagnosis Date Noted  . SBO (small bowel obstruction) ([place]) 10/31/2016  . Aortic insufficiency 07/09/2013  . HTN (hypertension) 07/09/2013  . PERSONAL HISTORY MALIGNANT NEOPLASM ESOPHAGUS  07/17/2009  . GERD 07/13/2009  . BARRETT'S ESOPHAGUS 07/13/2009  . DYSPHAGIA 07/13/2009  . FLATULENCE-GAS-BLOATING 07/13/2009   Past Medical History:  Diagnosis Date  . Acid reflux   . Barrett's esophagus   . BPH (benign prostatic hyperplasia)   . Colon polyps   . DDD (degenerative disc disease)   . Diabetes mellitus    controlled with diet and exercise  . ED (erectile dysfunction)   . Esophageal cancer ([place])    tx'd surgery - abdominal & right posterior chest approach (2001) - [place], [place]  . First degree AV block   . Gout   . Hemorrhoids   . History  of palpitations   . Hyperlipidemia   . Hypertension   . Lipoma   . Obesity   . Osteoarthritis   . Tinea versicolor   . Tinnitus     Family History  Problem Relation Age of Onset  . Diabetes Mother   . CAD Mother   . Stroke Sister   . Colon cancer Neg Hx   . Esophageal cancer Neg Hx   . Rectal cancer Neg Hx   . Stomach cancer Neg Hx     Past Surgical History:  Procedure Laterality Date  . bilateral olecranon bursal excisions    . Esophageal Cancer Surgery  2000, 2001  . GASTRIC RESECTION    . HEMORRHOID SURGERY    . NM MYOCAR PERF WALL MOTION  2011   dipyridamole myoview - stress images show medium in size, moderate in intensity perfusion defect in basal inferior & mid inferior walls with mild defect reversibility at rest, EF 64%  . TRANSTHORACIC ECHOCARDIOGRAM  2011   EF=>55%, mild mitral annular calcif, mild calcif of MV apparatus, mild TR, normal RSVP, mild AV regurg, aortic root sclerosis/calcifiication  . VASECTOMY     Social History   Occupational History  . self employed    Social History Main Topics  . Smoking status: Current Some Day Smoker    Years: 50.00    Types: Cigars, Pipe  . Smokeless tobacco: Never Used     Comment: 2-3x/daily, sometimes not at all , tobacco info given 08/01/13  . Alcohol use 3.5 - 7.0 oz/week    7 - 14 Standard drinks or equivalent per week  . Drug use: No  . Sexual activity: Not on file

## 2017-01-18 DIAGNOSIS — R269 Unspecified abnormalities of gait and mobility: Secondary | ICD-10-CM | POA: Diagnosis not present

## 2017-01-18 DIAGNOSIS — I10 Essential (primary) hypertension: Secondary | ICD-10-CM | POA: Diagnosis not present

## 2017-01-18 DIAGNOSIS — M545 Low back pain: Secondary | ICD-10-CM | POA: Diagnosis not present

## 2017-01-18 DIAGNOSIS — Z23 Encounter for immunization: Secondary | ICD-10-CM | POA: Diagnosis not present

## 2017-01-29 ENCOUNTER — Encounter (HOSPITAL_COMMUNITY): Payer: Self-pay | Admitting: Emergency Medicine

## 2017-01-29 ENCOUNTER — Inpatient Hospital Stay (HOSPITAL_COMMUNITY)
Admission: EM | Admit: 2017-01-29 | Discharge: 2017-02-03 | DRG: 390 | Disposition: A | Discharge status: Home / Self Care | Payer: Medicare Other | Attending: Internal Medicine | Admitting: Internal Medicine

## 2017-01-29 ENCOUNTER — Emergency Department (HOSPITAL_COMMUNITY): Payer: Medicare Other

## 2017-01-29 DIAGNOSIS — K219 Gastro-esophageal reflux disease without esophagitis: Secondary | ICD-10-CM | POA: Diagnosis present

## 2017-01-29 DIAGNOSIS — Z8659 Personal history of other mental and behavioral disorders: Secondary | ICD-10-CM

## 2017-01-29 DIAGNOSIS — Z9889 Other specified postprocedural states: Secondary | ICD-10-CM

## 2017-01-29 DIAGNOSIS — K2270 Barrett's esophagus without dysplasia: Secondary | ICD-10-CM | POA: Diagnosis not present

## 2017-01-29 DIAGNOSIS — I440 Atrioventricular block, first degree: Secondary | ICD-10-CM | POA: Diagnosis present

## 2017-01-29 DIAGNOSIS — E876 Hypokalemia: Secondary | ICD-10-CM | POA: Diagnosis not present

## 2017-01-29 DIAGNOSIS — Z91030 Bee allergy status: Secondary | ICD-10-CM

## 2017-01-29 DIAGNOSIS — G4733 Obstructive sleep apnea (adult) (pediatric): Secondary | ICD-10-CM | POA: Diagnosis not present

## 2017-01-29 DIAGNOSIS — F17290 Nicotine dependence, other tobacco product, uncomplicated: Secondary | ICD-10-CM | POA: Diagnosis present

## 2017-01-29 DIAGNOSIS — Z903 Acquired absence of stomach [part of]: Secondary | ICD-10-CM

## 2017-01-29 DIAGNOSIS — E119 Type 2 diabetes mellitus without complications: Secondary | ICD-10-CM | POA: Diagnosis not present

## 2017-01-29 DIAGNOSIS — Z8501 Personal history of malignant neoplasm of esophagus: Secondary | ICD-10-CM | POA: Diagnosis not present

## 2017-01-29 DIAGNOSIS — Z79899 Other long term (current) drug therapy: Secondary | ICD-10-CM

## 2017-01-29 DIAGNOSIS — K56609 Unspecified intestinal obstruction, unspecified as to partial versus complete obstruction: Secondary | ICD-10-CM

## 2017-01-29 DIAGNOSIS — M1990 Unspecified osteoarthritis, unspecified site: Secondary | ICD-10-CM | POA: Diagnosis present

## 2017-01-29 DIAGNOSIS — K5651 Intestinal adhesions [bands], with partial obstruction: Principal | ICD-10-CM | POA: Diagnosis present

## 2017-01-29 DIAGNOSIS — K56600 Partial intestinal obstruction, unspecified as to cause: Secondary | ICD-10-CM | POA: Diagnosis not present

## 2017-01-29 DIAGNOSIS — N400 Enlarged prostate without lower urinary tract symptoms: Secondary | ICD-10-CM | POA: Diagnosis present

## 2017-01-29 DIAGNOSIS — Z86010 Personal history of colonic polyps: Secondary | ICD-10-CM

## 2017-01-29 DIAGNOSIS — Z8719 Personal history of other diseases of the digestive system: Secondary | ICD-10-CM | POA: Diagnosis not present

## 2017-01-29 DIAGNOSIS — I10 Essential (primary) hypertension: Secondary | ICD-10-CM | POA: Diagnosis present

## 2017-01-29 DIAGNOSIS — Z9989 Dependence on other enabling machines and devices: Secondary | ICD-10-CM | POA: Diagnosis not present

## 2017-01-29 DIAGNOSIS — R109 Unspecified abdominal pain: Secondary | ICD-10-CM | POA: Diagnosis not present

## 2017-01-29 DIAGNOSIS — C159 Malignant neoplasm of esophagus, unspecified: Secondary | ICD-10-CM | POA: Diagnosis not present

## 2017-01-29 LAB — URINALYSIS, ROUTINE W REFLEX MICROSCOPIC
BILIRUBIN URINE: NEGATIVE
GLUCOSE, UA: NEGATIVE mg/dL
KETONES UR: NEGATIVE mg/dL
Leukocytes, UA: NEGATIVE
NITRITE: NEGATIVE
PH: 6 (ref 5.0–8.0)
Specific Gravity, Urine: 1.025 (ref 1.005–1.030)

## 2017-01-29 LAB — URINALYSIS, MICROSCOPIC (REFLEX)
Squamous Epithelial / LPF: NONE SEEN
WBC UA: NONE SEEN WBC/hpf (ref 0–5)

## 2017-01-29 LAB — CBC
HEMATOCRIT: 43.4 % (ref 39.0–52.0)
Hemoglobin: 14.7 g/dL (ref 13.0–17.0)
MCH: 31.6 pg (ref 26.0–34.0)
MCHC: 33.9 g/dL (ref 30.0–36.0)
MCV: 93.3 fL (ref 78.0–100.0)
Platelets: 289 10*3/uL (ref 150–400)
RBC: 4.65 MIL/uL (ref 4.22–5.81)
RDW: 12.3 % (ref 11.5–15.5)
WBC: 13.4 10*3/uL — ABNORMAL HIGH (ref 4.0–10.5)

## 2017-01-29 LAB — COMPREHENSIVE METABOLIC PANEL
ALBUMIN: 4.5 g/dL (ref 3.5–5.0)
ALT: 12 U/L — AB (ref 17–63)
AST: 19 U/L (ref 15–41)
Alkaline Phosphatase: 73 U/L (ref 38–126)
Anion gap: 10 (ref 5–15)
BILIRUBIN TOTAL: 1.1 mg/dL (ref 0.3–1.2)
BUN: 13 mg/dL (ref 6–20)
CO2: 27 mmol/L (ref 22–32)
Calcium: 9.6 mg/dL (ref 8.9–10.3)
Chloride: 99 mmol/L — ABNORMAL LOW (ref 101–111)
Creatinine, Ser: 1.14 mg/dL (ref 0.61–1.24)
GFR calc Af Amer: >60 mL/min (ref >60)
GFR calc non Af Amer: 58 mL/min — ABNORMAL LOW (ref >60)
GLUCOSE: 135 mg/dL — AB (ref 65–99)
POTASSIUM: 4.1 mmol/L (ref 3.5–5.1)
Sodium: 136 mmol/L (ref 135–145)
TOTAL PROTEIN: 8 g/dL (ref 6.5–8.1)

## 2017-01-29 LAB — LIPASE, BLOOD: LIPASE: 19 U/L (ref 11–51)

## 2017-01-29 MED ORDER — MORPHINE SULFATE (PF) 4 MG/ML IV SOLN
4.0000 mg | Freq: Once | INTRAVENOUS | Status: AC
  Administered 2017-01-29: 4 mg via INTRAVENOUS
  Filled 2017-01-29: qty 1

## 2017-01-29 MED ORDER — ONDANSETRON HCL 4 MG/2ML IJ SOLN
4.0000 mg | Freq: Four times a day (QID) | INTRAMUSCULAR | Status: DC | PRN
  Administered 2017-01-29: 4 mg via INTRAVENOUS
  Filled 2017-01-29: qty 2

## 2017-01-29 MED ORDER — DEXTROSE-NACL 5-0.9 % IV SOLN
INTRAVENOUS | Status: DC
  Administered 2017-01-29 – 2017-01-30 (×3): via INTRAVENOUS
  Administered 2017-01-31: 100 mL/h via INTRAVENOUS
  Administered 2017-01-31: 13:00:00 via INTRAVENOUS
  Administered 2017-01-31: 100 mL/h via INTRAVENOUS
  Administered 2017-02-01 – 2017-02-03 (×3): via INTRAVENOUS

## 2017-01-29 MED ORDER — IOPAMIDOL (ISOVUE-300) INJECTION 61%
INTRAVENOUS | Status: AC
  Administered 2017-01-29: 100 mL via INTRAVENOUS
  Filled 2017-01-29: qty 100

## 2017-01-29 MED ORDER — MORPHINE SULFATE (PF) 2 MG/ML IV SOLN
2.0000 mg | INTRAVENOUS | Status: DC | PRN
  Administered 2017-01-29 – 2017-02-02 (×9): 2 mg via INTRAVENOUS
  Filled 2017-01-29 (×10): qty 1

## 2017-01-29 MED ORDER — ENOXAPARIN SODIUM 40 MG/0.4ML ~~LOC~~ SOLN
40.0000 mg | SUBCUTANEOUS | Status: DC
  Administered 2017-01-29 – 2017-02-02 (×5): 40 mg via SUBCUTANEOUS
  Filled 2017-01-29 (×5): qty 0.4

## 2017-01-29 MED ORDER — ACETAMINOPHEN 650 MG RE SUPP: 650.0000 mg | Freq: Four times a day (QID) | RECTAL | Status: DC | PRN

## 2017-01-29 MED ORDER — ONDANSETRON HCL 4 MG PO TABS: 4.0000 mg | ORAL_TABLET | Freq: Four times a day (QID) | ORAL | Status: DC | PRN

## 2017-01-29 MED ORDER — ONDANSETRON HCL 4 MG/2ML IJ SOLN
4.0000 mg | INTRAMUSCULAR | Status: DC | PRN
  Administered 2017-01-29 – 2017-01-30 (×3): 4 mg via INTRAVENOUS
  Filled 2017-01-29 (×3): qty 2

## 2017-01-29 MED ORDER — PANTOPRAZOLE SODIUM 40 MG IV SOLR
40.0000 mg | INTRAVENOUS | Status: DC
  Administered 2017-01-29 – 2017-02-02 (×5): 40 mg via INTRAVENOUS
  Filled 2017-01-29 (×5): qty 40

## 2017-01-29 MED ORDER — HYDRALAZINE HCL 20 MG/ML IJ SOLN
10.0000 mg | Freq: Four times a day (QID) | INTRAMUSCULAR | Status: DC | PRN
  Administered 2017-01-30: 10 mg via INTRAVENOUS
  Filled 2017-01-29 (×2): qty 1

## 2017-01-29 MED ORDER — ACETAMINOPHEN 325 MG PO TABS: 650.0000 mg | ORAL_TABLET | Freq: Four times a day (QID) | ORAL | Status: DC | PRN

## 2017-01-29 NOTE — ED Notes (Signed)
ED TO INPATIENT HANDOFF REPORT  Name/Age/Gender Oscar [name] 83 y.o. male  Code Status Code Status History    Date Active Date Inactive Code Status Order ID Comments User Context   10/31/2016  3:01 AM 11/02/2016  7:45 PM Full Code [id number]  [name], MD ED      Home/SNF/Other Home  Chief Complaint abdominal pain  Level of Care/Admitting Diagnosis ED Disposition    ED Disposition Condition [facility] Area: [facility] [[id number]]  Level of Care: Med-Surg [16]  Diagnosis: Partial small bowel obstruction [facility]) [[id number]]  Admitting Physician: Louellen Molder [phone]  Attending Physician: Louellen Molder [phone]  Estimated length of stay: past midnight tomorrow  Certification:: I certify this patient will need inpatient services for at least 2 midnights  PT Class (Do Not Modify): Inpatient [101]  PT Acc Code (Do Not Modify): Private [1]       Medical History Past Medical History:  Diagnosis Date  . Acid reflux   . Barrett's esophagus   . BPH (benign prostatic hyperplasia)   . Colon polyps   . DDD (degenerative disc disease)   . Diabetes mellitus    controlled with diet and exercise  . ED (erectile dysfunction)   . Esophageal cancer ([place])    tx'd surgery - abdominal & right posterior chest approach (2001) - [place], [place]  . First degree AV block   . Gout   . Hemorrhoids   . History of palpitations   . Hyperlipidemia   . Hypertension   . Lipoma   . Obesity   . Osteoarthritis   . Tinea versicolor   . Tinnitus     Allergies Allergies  Allergen Reactions  . Bee Venom     IV Location/Drains/Wounds Patient Lines/Drains/Airways Status   Active Line/Drains/Airways    Name:   Placement date:   Placement time:   Site:   Days:   Peripheral IV 01/29/17 Left Antecubital  01/29/17    1321    Antecubital    less than 1          Labs/Imaging Results for orders placed or performed during the hospital encounter of  01/29/17 (from the past 48 hour(s))  Urinalysis, Routine w reflex microscopic     Status: Abnormal   Collection Time: 01/29/17  8:12 AM  Result Value Ref Range   Color, Urine YELLOW YELLOW   APPearance CLEAR CLEAR   Specific Gravity, Urine 1.025 1.005 - 1.030   pH 6.0 5.0 - 8.0   Glucose, UA NEGATIVE NEGATIVE mg/dL   Hgb urine dipstick SMALL (A) NEGATIVE   Bilirubin Urine NEGATIVE NEGATIVE   Ketones, ur NEGATIVE NEGATIVE mg/dL   Protein, ur TRACE (A) NEGATIVE mg/dL   Nitrite NEGATIVE NEGATIVE   Leukocytes, UA NEGATIVE NEGATIVE  Urinalysis, Microscopic (reflex)     Status: Abnormal   Collection Time: 01/29/17  8:12 AM  Result Value Ref Range   RBC / HPF 0-5 0 - 5 RBC/hpf   WBC, UA NONE SEEN 0 - 5 WBC/hpf   Bacteria, UA RARE (A) NONE SEEN   Squamous Epithelial / LPF NONE SEEN NONE SEEN   Mucus PRESENT   Lipase, blood     Status: None   Collection Time: 01/29/17  9:20 AM  Result Value Ref Range   Lipase 19 11 - 51 U/L  Comprehensive metabolic panel     Status: Abnormal   Collection Time: 01/29/17  9:20 AM  Result Value Ref Range  Sodium 136 135 - 145 mmol/L   Potassium 4.1 3.5 - 5.1 mmol/L   Chloride 99 (L) 101 - 111 mmol/L   CO2 27 22 - 32 mmol/L   Glucose, Bld 135 (H) 65 - 99 mg/dL   BUN 13 6 - 20 mg/dL   Creatinine, Ser 1.14 0.61 - 1.24 mg/dL   Calcium 9.6 8.9 - 10.3 mg/dL   Total Protein 8.0 6.5 - 8.1 g/dL   Albumin 4.5 3.5 - 5.0 g/dL   AST 19 15 - 41 U/L   ALT 12 (L) 17 - 63 U/L   Alkaline Phosphatase 73 38 - 126 U/L   Total Bilirubin 1.1 0.3 - 1.2 mg/dL   GFR calc non Af Amer 58 (L) >60 mL/min   GFR calc Af Amer >60 >60 mL/min    Comment: (NOTE) The eGFR has been calculated using the CKD EPI equation. This calculation has not been validated in all clinical situations. eGFR's persistently <60 mL/min signify possible Chronic Kidney Disease.    Anion gap 10 5 - 15  CBC     Status: Abnormal   Collection Time: 01/29/17  9:20 AM  Result Value Ref Range   WBC 13.4  (H) 4.0 - 10.5 K/uL   RBC 4.65 4.22 - 5.81 MIL/uL   Hemoglobin 14.7 13.0 - 17.0 g/dL   HCT 43.4 39.0 - 52.0 %   MCV 93.3 78.0 - 100.0 fL   MCH 31.6 26.0 - 34.0 pg   MCHC 33.9 30.0 - 36.0 g/dL   RDW 12.3 11.5 - 15.5 %   Platelets 289 150 - 400 K/uL   Ct Abdomen Pelvis W Contrast  Result Date: 01/29/2017 CLINICAL DATA:  Generalized abdominal pain for several hours, initial encounter EXAM: CT ABDOMEN AND PELVIS WITH CONTRAST TECHNIQUE: Multidetector CT imaging of the abdomen and pelvis was performed using the standard protocol following bolus administration of intravenous contrast. CONTRAST:  100 mL ISOVUE-300 IOPAMIDOL (ISOVUE-300) INJECTION 61% COMPARISON:  10/31/2016 FINDINGS: Lower chest: Mild scarring is noted in the bases bilaterally. No focal confluent infiltrate is seen. Hepatobiliary: The liver is within normal limits. The gallbladder is well distended with multiple small dependent stones. Pancreas: Unremarkable. No pancreatic ductal dilatation or surrounding inflammatory changes. Spleen: Normal in size without focal abnormality. Adrenals/Urinary Tract: Renal cystic changes are noted in the left kidney. No obstructive changes are seen. The bladder is partially distended. Stomach/Bowel: There are changes consistent with prior esophageal surgery with gastric pull-through. These are incompletely evaluated on this exam. Scattered diverticular change of the colon is noted. The appendix is within normal limits. Considerable small bowel dilatation is identified proximally with a transition zone in the mid abdomen just anterior to the aorta best seen on image number 41 of series 2 and image number or 46 of series 4. No definitive mass lesion is noted in this is likely related to adhesions. The more distal small bowel is within normal limits. Vascular/Lymphatic: Aortic atherosclerosis. No enlarged abdominal or pelvic lymph nodes. Reproductive: Prostate is enlarged. Other: Minimal free fluid is noted within  the pelvis likely reactive in nature. No evidence of perforation is seen. No hernia is identified. Musculoskeletal: Degenerative changes of the lumbar spine are seen. IMPRESSION: Small bowel dilatation secondary to an area of transition in the mid abdomen likely related to adhesions. Cholelithiasis without complicating factors. Chronic changes as described above. Electronically Signed   By: Inez Catalina M.D.   On: 01/29/2017 14:26    Pending Labs [employer]  None      Vitals/Pain Today's Vitals   01/29/17 1311 01/29/17 1330 01/29/17 1411 01/29/17 1500  BP:  (!) 183/66  (!) 163/84  Pulse: (!) 51 (!) 58  72  Resp:    14  Temp:      TempSrc:      SpO2: 98% 91%  97%  PainSc:   2      Isolation Precautions No active isolations  Medications Medications  morphine 4 MG/ML injection 4 mg (4 mg Intravenous Given 01/29/17 1319)  iopamidol (ISOVUE-300) 61 % injection (100 mLs Intravenous Contrast Given 01/29/17 1350)    Mobility walks with device

## 2017-01-29 NOTE — Progress Notes (Signed)
Pt continues to vomit after zofran. Dr Zella Richer paged. Donne Hazel, RN

## 2017-01-29 NOTE — ED Triage Notes (Signed)
Pt reports he has been having generlized abd pain and swelling since 10 pm last night. Has not had any emesis. Feels like he has to have diarrhea.

## 2017-01-29 NOTE — Consult Note (Signed)
Reason for Consult:SBO Referring Physician: Dr. Lowella Fairy is an 83 y.o. male.  HPI: this is an 83 year old male with a transhiatal esophagectomy. He was admitted 3 months ago with a partial small bowel obstruction that resolved without surgery.He ate a large Samb which from [employer] last night and at 9 AM began developing abdominal pain with some nausea and then vomiting. He presented to the emergency department here for evaluation. CT scan demonstrated findings consistent with partial small bowel obstruction. It is difficult to place an NG tube in him because of his anatomy following his esophagectomy. He is up on the floor now. He just vomited a large amount and feels much better and states his abdominal distention is now gone. Has not passed gas today or had a bowel movement today. He had a normal bowel movement yesterday morning.  Past Medical History:  Diagnosis Date  . Acid reflux   . Barrett's esophagus   . BPH (benign prostatic hyperplasia)   . Colon polyps   . DDD (degenerative disc disease)   . Diabetes mellitus    controlled with diet and exercise  . ED (erectile dysfunction)   . Esophageal cancer ([place])    tx'd surgery - abdominal & right posterior chest approach (2001) - [place], [place]  . First degree AV block   . Gout   . Hemorrhoids   . History of palpitations   . Hyperlipidemia   . Hypertension   . Lipoma   . Obesity   . Osteoarthritis   . Tinea versicolor   . Tinnitus     Past Surgical History:  Procedure Laterality Date  . bilateral olecranon bursal excisions    . Esophageal Cancer Surgery  2000, 2001  . GASTRIC RESECTION    . HEMORRHOID SURGERY    . NM MYOCAR PERF WALL MOTION  2011   dipyridamole myoview - stress images show medium in size, moderate in intensity perfusion defect in basal inferior & mid inferior walls with mild defect reversibility at rest, EF 64%  . TRANSTHORACIC ECHOCARDIOGRAM  2011   EF=>55%, mild mitral annular calcif, mild  calcif of MV apparatus, mild TR, normal RSVP, mild AV regurg, aortic root sclerosis/calcifiication  . VASECTOMY      Allergies:  Allergies  Allergen Reactions  . Bee Venom     Prior to Admission medications   Medication Sig Start Date End Date Taking? Authorizing Provider  atenolol (TENORMIN) 25 MG tablet Take 1 tablet (25 mg total) by mouth daily. Patient taking differently: Take 25 mg by mouth.  07/09/13  Yes Hilty, Nadean Corwin, MD  HYDROcodone-acetaminophen (NORCO/VICODIN) 5-325 MG per tablet Take 1 tablet by mouth every 6 (six) hours as needed for moderate pain.   Yes [provider]  losartan (COZAAR) 100 MG tablet Take 0.5 tablets (50 mg total) by mouth daily. 11/03/16  Yes Florencia Reasons, MD  omeprazole (PRILOSEC) 20 MG capsule Take 40 mg by mouth 2 (two) times daily before a meal.    Yes [provider]  senna-docusate (SENOKOT-S) 8.6-50 MG tablet Take 1 tablet by mouth at bedtime. 11/02/16  Yes Florencia Reasons, MD  traMADol (ULTRAM) 50 MG tablet Take 50-100 mg by mouth 3 (three) times daily as needed for moderate pain or severe pain.  10/06/16  Yes [provider]  vitamin B-12 (CYANOCOBALAMIN) 1000 MCG tablet Take 1,000 mcg by mouth daily.   Yes [provider]  Magnesium Oxide 400 (240 Mg) MG TABS Take 30 tablets (12,000  mg total) by mouth daily at 2 PM. Patient not taking: Reported on 01/29/2017 11/02/16   Florencia Reasons, MD  methylPREDNISolone (MEDROL) 4 MG tablet Take dosepak as directed. Patient not taking: Reported on 01/29/2017 01/11/17   Meredith Pel, MD  Saw Palmetto, Serenoa repens, (SAW PALMETTO PO) Take 2 capsules by mouth daily.    [provider]     Family History  Problem Relation Age of Onset  . Diabetes Mother   . CAD Mother   . Stroke Sister   . Colon cancer Neg Hx   . Esophageal cancer Neg Hx   . Rectal cancer Neg Hx   . Stomach cancer Neg Hx     Social History:  reports that he has been smoking [employer]ig[employer] Pipe.  He has smoked  for the past 50.00 years. He has never used smokeless tobacco. He reports that he drinks about 3.5 - 7.0 oz of alcohol per week . He reports that he does not use drugs.  ROS General:  Negative  Breast:  Negative  Infectious Diseases: Negative  Cardiac  :  Negative  Pulmonary:  Negative  Endocrine:  Negative  Skin:  Negative  Gastrointestinal:  See HPI  Genitourinary:  Negative  Neurological:  Negative  Hematologic/Lymphatic:  Negative  HEENT:  Negative  Musculoskeletal:  Negative   Blood pressure (!) 176/107, pulse 96, temperature 97.7 F (36.5 C), temperature source Oral, resp. rate 14, SpO2 96 %.  BP (!) 176/107   Pulse 96   Temp 97.7 F (36.5 C) (Oral)   Resp 14   SpO2 96%   PE   GENERAL APPEARANCE:  WDWN in NAD.  Pleasant and cooperative.  EARS, NOSE, MOUTH THROAT:  [place]/AT external ears:  no lesions or deformities external nose:  no lesions or deformities hearing:  grossly normal lips:  moist, no deformities EYES external: conjunctiva, lids, sclerae normal pupils:  equal, round glasses: no  CV ascultation:  RRR, no murmur extremity edema:  no  RESP auscultation:  breath sounds equal and clear respiratory effort:  normal  GASTROINTESTINAL abdomen:  Soft, non-tender, non-distended, no masses, few bowel sounds liver and spleen:  not enlarged. hernia:  none present scar:  midline  NEUROLOGIC speech:  normal  PSYCHIATRIC alertness and orientation:  normal mood/affect/behavior:  normal judgement and insight:  normal         Results for orders placed or performed during the hospital encounter of 01/29/17 (from the past 48 hour(s))  Urinalysis, Routine w reflex microscopic     Status: Abnormal   Collection Time: 01/29/17  8:12 AM  Result Value Ref Range   Color, Urine YELLOW YELLOW   APPearance CLEAR CLEAR   Specific Gravity, Urine 1.025 1.005 - 1.030   pH 6.0 5.0 - 8.0   Glucose, UA NEGATIVE NEGATIVE mg/dL   Hgb urine dipstick  SMALL (A) NEGATIVE   Bilirubin Urine NEGATIVE NEGATIVE   Ketones, ur NEGATIVE NEGATIVE mg/dL   Protein, ur TRACE (A) NEGATIVE mg/dL   Nitrite NEGATIVE NEGATIVE   Leukocytes, UA NEGATIVE NEGATIVE  Urinalysis, Microscopic (reflex)     Status: Abnormal   Collection Time: 01/29/17  8:12 AM  Result Value Ref Range   RBC / HPF 0-5 0 - 5 RBC/hpf   WBC, UA NONE SEEN 0 - 5 WBC/hpf   Bacteria, UA RARE (A) NONE SEEN   Squamous Epithelial / LPF NONE SEEN NONE SEEN   Mucus PRESENT   Lipase, blood     Status: None  Collection Time: 01/29/17  9:20 AM  Result Value Ref Range   Lipase 19 11 - 51 U/L  Comprehensive metabolic panel     Status: Abnormal   Collection Time: 01/29/17  9:20 AM  Result Value Ref Range   Sodium 136 135 - 145 mmol/L   Potassium 4.1 3.5 - 5.1 mmol/L   Chloride 99 (L) 101 - 111 mmol/L   CO2 27 22 - 32 mmol/L   Glucose, Bld 135 (H) 65 - 99 mg/dL   BUN 13 6 - 20 mg/dL   Creatinine, Ser 1.14 0.61 - 1.24 mg/dL   Calcium 9.6 8.9 - 10.3 mg/dL   Total Protein 8.0 6.5 - 8.1 g/dL   Albumin 4.5 3.5 - 5.0 g/dL   AST 19 15 - 41 U/L   ALT 12 (L) 17 - 63 U/L   Alkaline Phosphatase 73 38 - 126 U/L   Total Bilirubin 1.1 0.3 - 1.2 mg/dL   GFR calc non Af Amer 58 (L) >60 mL/min   GFR calc Af Amer >60 >60 mL/min    Comment: (NOTE) The eGFR has been calculated using the CKD EPI equation. This calculation has not been validated in all clinical situations. eGFR's persistently <60 mL/min signify possible Chronic Kidney Disease.    Anion gap 10 5 - 15  CBC     Status: Abnormal   Collection Time: 01/29/17  9:20 AM  Result Value Ref Range   WBC 13.4 (H) 4.0 - 10.5 K/uL   RBC 4.65 4.22 - 5.81 MIL/uL   Hemoglobin 14.7 13.0 - 17.0 g/dL   HCT 43.4 39.0 - 52.0 %   MCV 93.3 78.0 - 100.0 fL   MCH 31.6 26.0 - 34.0 pg   MCHC 33.9 30.0 - 36.0 g/dL   RDW 12.3 11.5 - 15.5 %   Platelets 289 150 - 400 K/uL    Ct Abdomen Pelvis W Contrast  Result Date: 01/29/2017 CLINICAL DATA:  Generalized  abdominal pain for several hours, initial encounter EXAM: CT ABDOMEN AND PELVIS WITH CONTRAST TECHNIQUE: Multidetector CT imaging of the abdomen and pelvis was performed using the standard protocol following bolus administration of intravenous contrast. CONTRAST:  100 mL ISOVUE-300 IOPAMIDOL (ISOVUE-300) INJECTION 61% COMPARISON:  10/31/2016 FINDINGS: Lower chest: Mild scarring is noted in the bases bilaterally. No focal confluent infiltrate is seen. Hepatobiliary: The liver is within normal limits. The gallbladder is well distended with multiple small dependent stones. Pancreas: Unremarkable. No pancreatic ductal dilatation or surrounding inflammatory changes. Spleen: Normal in size without focal abnormality. Adrenals/Urinary Tract: Renal cystic changes are noted in the left kidney. No obstructive changes are seen. The bladder is partially distended. Stomach/Bowel: There are changes consistent with prior esophageal surgery with gastric pull-through. These are incompletely evaluated on this exam. Scattered diverticular change of the colon is noted. The appendix is within normal limits. Considerable small bowel dilatation is identified proximally with a transition zone in the mid abdomen just anterior to the aorta best seen on image number 41 of series 2 and image number or 46 of series 4. No definitive mass lesion is noted in this is likely related to adhesions. The more distal small bowel is within normal limits. Vascular/Lymphatic: Aortic atherosclerosis. No enlarged abdominal or pelvic lymph nodes. Reproductive: Prostate is enlarged. Other: Minimal free fluid is noted within the pelvis likely reactive in nature. No evidence of perforation is seen. No hernia is identified. Musculoskeletal: Degenerative changes of the lumbar spine are seen. IMPRESSION: Small bowel dilatation secondary to an  area of transition in the mid abdomen likely related to adhesions. Cholelithiasis without complicating factors. Chronic  changes as described above. Electronically Signed   By: Inez Catalina M.D.   On: 01/29/2017 14:26    Assessment/Plan: Recurrent partial small bowel obstruction most likely due to adhesions.  Plan: Attempt non-operative management again. Repeat x-rays tomorrow. If he continues to vomit and have x-ray findings of persistent small bowel obstruction, we'll try to place an NG tube under radiologic guidance.  Clarrissa Shimkus J 01/29/2017, 5:06 PM

## 2017-01-29 NOTE — ED Provider Notes (Signed)
[facility] DEPT Provider Note   CSN: [id number] Arrival date & time: 01/29/17  3382     History   Chief Complaint Chief Complaint  Patient presents with  . Abdominal Pain    HPI Oscar [name] is a 83 y.o. male.  HPI [place] of diffuse abdominal pain gradual onset last night. Associated symptoms include inability to pass gas per rectum for the past several hours and nausea, no vomiting no fever. Nothing makes symptoms better or worse. Pain is constant and feels like all obstruction he's had in the past. He denies nausea at present denies vomiting. Denies urinary symptoms. Treated with omeprazole, without relief. His last bowel movement yesterday normal Past Medical History:  Diagnosis Date  . Acid reflux   . Barrett's esophagus   . BPH (benign prostatic hyperplasia)   . Colon polyps   . DDD (degenerative disc disease)   . Diabetes mellitus    controlled with diet and exercise  . ED (erectile dysfunction)   . Esophageal cancer ([place])    tx'd surgery - abdominal & right posterior chest approach (2001) - [place], [place]  . First degree AV block   . Gout   . Hemorrhoids   . History of palpitations   . Hyperlipidemia   . Hypertension   . Lipoma   . Obesity   . Osteoarthritis   . Tinea versicolor   . Tinnitus     Patient Active Problem List   Diagnosis Date Noted  . SBO (small bowel obstruction) ([place]) 10/31/2016  . Aortic insufficiency 07/09/2013  . HTN (hypertension) 07/09/2013  . PERSONAL HISTORY MALIGNANT NEOPLASM ESOPHAGUS 07/17/2009  . GERD 07/13/2009  . BARRETT'S ESOPHAGUS 07/13/2009  . DYSPHAGIA 07/13/2009  . FLATULENCE-GAS-BLOATING 07/13/2009    Past Surgical History:  Procedure Laterality Date  . bilateral olecranon bursal excisions    . Esophageal Cancer Surgery  2000, 2001  . GASTRIC RESECTION    . HEMORRHOID SURGERY    . NM MYOCAR PERF WALL MOTION  2011   dipyridamole myoview - stress images show medium in size, moderate in intensity perfusion  defect in basal inferior & mid inferior walls with mild defect reversibility at rest, EF 64%  . TRANSTHORACIC ECHOCARDIOGRAM  2011   EF=>55%, mild mitral annular calcif, mild calcif of MV apparatus, mild TR, normal RSVP, mild AV regurg, aortic root sclerosis/calcifiication  . VASECTOMY         Home Medications    Prior to Admission medications   Medication Sig Start Date End Date Taking? Authorizing Provider  atenolol (TENORMIN) 25 MG tablet Take 1 tablet (25 mg total) by mouth daily. Patient taking differently: Take 25 mg by mouth daily after breakfast.  07/09/13   Hilty, Nadean Corwin, MD  HYDROcodone-acetaminophen (NORCO/VICODIN) 5-325 MG per tablet Take 1 tablet by mouth every 6 (six) hours as needed for moderate pain.    [provider]  losartan (COZAAR) 100 MG tablet Take 0.5 tablets (50 mg total) by mouth daily. 11/03/16   Florencia Reasons, MD  Magnesium Oxide 400 (240 Mg) MG TABS Take 30 tablets (12,000 mg total) by mouth daily at 2 PM. 11/02/16   Florencia Reasons, MD  methylPREDNISolone (MEDROL) 4 MG tablet Take dosepak as directed. 01/11/17   Meredith Pel, MD  omeprazole (PRILOSEC) 20 MG capsule Take 40 mg by mouth 2 (two) times daily before a meal.     [provider]  Saw Palmetto, Serenoa repens, (SAW PALMETTO PO) Take 2 capsules by mouth daily.  [provider]  senna-docusate (SENOKOT-S) 8.6-50 MG tablet Take 1 tablet by mouth at bedtime. 11/02/16   Florencia Reasons, MD  traMADol (ULTRAM) 50 MG tablet Take 50-100 mg by mouth 3 (three) times daily as needed for moderate pain or severe pain.  10/06/16   [provider]  vitamin B-12 (CYANOCOBALAMIN) 1000 MCG tablet Take 1,000 mcg by mouth daily.    [provider]    Family History Family History  Problem Relation Age of Onset  . Diabetes Mother   . CAD Mother   . Stroke Sister   . Colon cancer Neg Hx   . Esophageal cancer Neg Hx   . Rectal cancer Neg Hx   . Stomach cancer Neg Hx     Social  History Social History  Substance Use Topics  . Smoking status: Current Some Day Smoker    Years: 50.00    Types: Cigars, Pipe  . Smokeless tobacco: Never Used     Comment: 2-3x/daily, sometimes not at all , tobacco info given 08/01/13  . Alcohol use 3.5 - 7.0 oz/week    7 - 14 Standard drinks or equivalent per week     Allergies   Bee venom   Review of Systems Review of Systems  Constitutional: Negative.   HENT: Negative.   Respiratory: Negative.   Cardiovascular: Negative.   Gastrointestinal: Positive for abdominal pain and nausea.  Musculoskeletal: Negative.   Skin: Negative.   Neurological: Negative.   Psychiatric/Behavioral: Negative.   All other systems reviewed and are negative.    Physical Exam Updated Vital Signs BP (!) 197/85   Pulse (!) 56   Temp 97.7 F (36.5 C) (Oral)   Resp 16   SpO2 96%   Physical Exam  Constitutional: He appears well-developed and well-nourished. He appears distressed.  Appears uncomfortable  HENT:  Head: Normocephalic and atraumatic.  Eyes: Pupils are equal, round, and reactive to light. Conjunctivae are normal.  Neck: Neck supple. No tracheal deviation present. No thyromegaly present.  Cardiovascular: Normal rate and regular rhythm.   No murmur heard. Pulmonary/Chest: Effort normal and breath sounds normal.  Abdominal: Soft. He exhibits no distension. There is tenderness.  Midline surgical scar. Bowel sounds diminished. Diffuse tenderness  Genitourinary: Penis normal.  Genitourinary Comments: Scrotum normal  Musculoskeletal: Normal range of motion. He exhibits no edema or tenderness.  Neurological: He is alert. Coordination normal.  Skin: Skin is warm and dry. No rash noted.  Psychiatric: He has a normal mood and affect.  Nursing note and vitals reviewed.    ED Treatments / Results  Labs (all labs ordered are listed, but only abnormal results are displayed) Labs Reviewed  COMPREHENSIVE METABOLIC PANEL - Abnormal;  Notable for the following:       Result Value   Chloride 99 (*)    Glucose, Bld 135 (*)    ALT 12 (*)    GFR calc non Af Amer 58 (*)    All other components within normal limits  CBC - Abnormal; Notable for the following:    WBC 13.4 (*)    All other components within normal limits  URINALYSIS, ROUTINE W REFLEX MICROSCOPIC - Abnormal; Notable for the following:    Hgb urine dipstick SMALL (*)    Protein, ur TRACE (*)    All other components within normal limits  URINALYSIS, MICROSCOPIC (REFLEX) - Abnormal; Notable for the following:    Bacteria, UA RARE (*)    All other components within normal limits  LIPASE,  BLOOD    EKG  EKG Interpretation None       Radiology No results found.  Procedures Procedures (including critical care time)  Medications Ordered in ED Medications - No data to display Results for orders placed or performed during the hospital encounter of 01/29/17  Lipase, blood  Result Value Ref Range   Lipase 19 11 - 51 U/L  Comprehensive metabolic panel  Result Value Ref Range   Sodium 136 135 - 145 mmol/L   Potassium 4.1 3.5 - 5.1 mmol/L   Chloride 99 (L) 101 - 111 mmol/L   CO2 27 22 - 32 mmol/L   Glucose, Bld 135 (H) 65 - 99 mg/dL   BUN 13 6 - 20 mg/dL   Creatinine, Ser 1.14 0.61 - 1.24 mg/dL   Calcium 9.6 8.9 - 10.3 mg/dL   Total Protein 8.0 6.5 - 8.1 g/dL   Albumin 4.5 3.5 - 5.0 g/dL   AST 19 15 - 41 U/L   ALT 12 (L) 17 - 63 U/L   Alkaline Phosphatase 73 38 - 126 U/L   Total Bilirubin 1.1 0.3 - 1.2 mg/dL   GFR calc non Af Amer 58 (L) >60 mL/min   GFR calc Af Amer >60 >60 mL/min   Anion gap 10 5 - 15  CBC  Result Value Ref Range   WBC 13.4 (H) 4.0 - 10.5 K/uL   RBC 4.65 4.22 - 5.81 MIL/uL   Hemoglobin 14.7 13.0 - 17.0 g/dL   HCT 43.4 39.0 - 52.0 %   MCV 93.3 78.0 - 100.0 fL   MCH 31.6 26.0 - 34.0 pg   MCHC 33.9 30.0 - 36.0 g/dL   RDW 12.3 11.5 - 15.5 %   Platelets 289 150 - 400 K/uL  Urinalysis, Routine w reflex microscopic  Result  Value Ref Range   Color, Urine YELLOW YELLOW   APPearance CLEAR CLEAR   Specific Gravity, Urine 1.025 1.005 - 1.030   pH 6.0 5.0 - 8.0   Glucose, UA NEGATIVE NEGATIVE mg/dL   Hgb urine dipstick SMALL (A) NEGATIVE   Bilirubin Urine NEGATIVE NEGATIVE   Ketones, ur NEGATIVE NEGATIVE mg/dL   Protein, ur TRACE (A) NEGATIVE mg/dL   Nitrite NEGATIVE NEGATIVE   Leukocytes, UA NEGATIVE NEGATIVE  Urinalysis, Microscopic (reflex)  Result Value Ref Range   RBC / HPF 0-5 0 - 5 RBC/hpf   WBC, UA NONE SEEN 0 - 5 WBC/hpf   Bacteria, UA RARE (A) NONE SEEN   Squamous Epithelial / LPF NONE SEEN NONE SEEN   Mucus PRESENT    Ct Abdomen Pelvis W Contrast  Result Date: 01/29/2017 CLINICAL DATA:  Generalized abdominal pain for several hours, initial encounter EXAM: CT ABDOMEN AND PELVIS WITH CONTRAST TECHNIQUE: Multidetector CT imaging of the abdomen and pelvis was performed using the standard protocol following bolus administration of intravenous contrast. CONTRAST:  100 mL ISOVUE-300 IOPAMIDOL (ISOVUE-300) INJECTION 61% COMPARISON:  10/31/2016 FINDINGS: Lower chest: Mild scarring is noted in the bases bilaterally. No focal confluent infiltrate is seen. Hepatobiliary: The liver is within normal limits. The gallbladder is well distended with multiple small dependent stones. Pancreas: Unremarkable. No pancreatic ductal dilatation or surrounding inflammatory changes. Spleen: Normal in size without focal abnormality. Adrenals/Urinary Tract: Renal cystic changes are noted in the left kidney. No obstructive changes are seen. The bladder is partially distended. Stomach/Bowel: There are changes consistent with prior esophageal surgery with gastric pull-through. These are incompletely evaluated on this exam. Scattered diverticular change of the colon is  noted. The appendix is within normal limits. Considerable small bowel dilatation is identified proximally with a transition zone in the mid abdomen just anterior to the aorta  best seen on image number 41 of series 2 and image number or 46 of series 4. No definitive mass lesion is noted in this is likely related to adhesions. The more distal small bowel is within normal limits. Vascular/Lymphatic: Aortic atherosclerosis. No enlarged abdominal or pelvic lymph nodes. Reproductive: Prostate is enlarged. Other: Minimal free fluid is noted within the pelvis likely reactive in nature. No evidence of perforation is seen. No hernia is identified. Musculoskeletal: Degenerative changes of the lumbar spine are seen. IMPRESSION: Small bowel dilatation secondary to an area of transition in the mid abdomen likely related to adhesions. Cholelithiasis without complicating factors. Chronic changes as described above. Electronically Signed   By: Inez Catalina M.D.   On: 01/29/2017 14:26   Xr Lumbar Spine 2-3 Views  Result Date: 01/11/2017 AP lateral lumbar spine reviewed.  Hip joints without arthritis.  Surgical clips noted left upper quadrant of the abdomen.  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  Calcification of the aorta is noted.  No spondylolisthesis or compression fractures present.   Initial Impression / Assessment and Plan / ED Course  I have reviewed the triage vital signs and the nursing notes.  Pertinent labs & imaging results that were available during my care of the patient were reviewed by me and considered in my medical decision making (see chart for details).     2:10 PM pain much improved after treatment with intravenous morphine. Patient resting comfortably 3 PM patient vomited slight amount of yellowish material however feels much improved after vomiting. I've consulted hospitalist Dr.Dhungel who will arrange for admission. I've also consulted Dr.Rosenbower from general surgery service who will see patient patient while in the hospital. It is okay to withhold NG tube presently.  Final Clinical Impressions(s) / ED Diagnoses  Diagnosis partial  small bowel obstruction Final diagnoses:  None    New Prescriptions New Prescriptions   No medications on file     Orlie Dakin, MD 01/29/17 [phone]

## 2017-01-29 NOTE — H&P (Signed)
TRH H&P   Patient Demographics:    Oscar [name], is a 83 y.o. male  MRN: [id number]   DOB - 1932/06/10  Admit Date - 01/29/2017  Outpatient Primary MD for the patient is Deland Pretty, MD  Referring MD: Dr. Cathleen Fears  Outpatient Specialists: None  Patient coming from: Home  Chief Complaint  Patient presents with  . Abdominal Pain      HPI:    Oscar [name]  is a 83 y.o. male, With history of hypertension, esophageal cancer status post surgery in 2001, sleep apnea who was hospitalized in June this year with small bowel obstruction secondary to adhesions, managed nonoperatively presented to the ED today with one-day history of mid abdominal pain associated with nausea. Patient reported having abdominal distention similar to small bowel obstruction he had a few months back. Has not been able to pass gas since this morning. In the ED he had 2 episodes of nonbilious vomiting. He denies any fevers, chills, headache, blurred vision, dizziness, chest pain, shortness of breath, palpitations, dysuria or diarrhea. Last bowel movement was yesterday. Denies any change in his medications. In the ED patient's blood pressure was elevated up to 197/85 mmHg . blood work showed WBC of 13.4 K, normal electrolytes. CT of the abdomen and pelvis with contrast showed small bowel dilatation with area of transition in the mid abdomen possibly associated with adhesions. Hospitalist consulted for observation. Last Surgery to Be Consulted by ED Physician.    Review of systems:    In addition to the HPI above,  No Fever-chills, No Headache, No changes with Vision or hearing, No problems swallowing food or Liquids, No Chest pain, Cough or Shortness of Breath, Abdominal pain, nausea and vomiting, no constipation No Blood in stool or Urine, No dysuria, No new skin rashes or bruises, No new joints  pains-aches,  No new weakness, tingling, numbness in any extremity, No recent weight gain or loss, No polyuria, polydypsia or polyphagia, No significant Mental Stressors.  A full 10 point Review of Systems was done, except as stated above, all other Review of Systems were negative.   With Past History of the following :    Past Medical History:  Diagnosis Date  . Acid reflux   . Barrett's esophagus   . BPH (benign prostatic hyperplasia)   . Colon polyps   . DDD (degenerative disc disease)   . Diabetes mellitus    controlled with diet and exercise  . ED (erectile dysfunction)   . Esophageal cancer ([place])    tx'd surgery - abdominal & right posterior chest approach (2001) - [place], [place]  . First degree AV block   . Gout   . Hemorrhoids   . History of palpitations   . Hyperlipidemia   . Hypertension   . Lipoma   . Obesity   . Osteoarthritis   . Tinea versicolor   .  Tinnitus       Past Surgical History:  Procedure Laterality Date  . bilateral olecranon bursal excisions    . Esophageal Cancer Surgery  2000, 2001  . GASTRIC RESECTION    . HEMORRHOID SURGERY    . NM MYOCAR PERF WALL MOTION  2011   dipyridamole myoview - stress images show medium in size, moderate in intensity perfusion defect in basal inferior & mid inferior walls with mild defect reversibility at rest, EF 64%  . TRANSTHORACIC ECHOCARDIOGRAM  2011   EF=>55%, mild mitral annular calcif, mild calcif of MV apparatus, mild TR, normal RSVP, mild AV regurg, aortic root sclerosis/calcifiication  . VASECTOMY        Social History:     Social History  Substance Use Topics  . Smoking status: Current Some Day Smoker    Years: 50.00    Types: Cigars, Pipe  . Smokeless tobacco: Never Used     Comment: 2-3x/daily, sometimes not at all , tobacco info given 08/01/13  . Alcohol use 3.5 - 7.0 oz/week    7 - 14 Standard drinks or equivalent per week     Lives - At home  Mobility - independent     Family  History :     Family History  Problem Relation Age of Onset  . Diabetes Mother   . CAD Mother   . Stroke Sister   . Colon cancer Neg Hx   . Esophageal cancer Neg Hx   . Rectal cancer Neg Hx   . Stomach cancer Neg Hx       Home Medications:   Prior to Admission medications   Medication Sig Start Date End Date Taking? Authorizing Provider  atenolol (TENORMIN) 25 MG tablet Take 1 tablet (25 mg total) by mouth daily. Patient taking differently: Take 25 mg by mouth.  07/09/13  Yes Hilty, Nadean Corwin, MD  HYDROcodone-acetaminophen (NORCO/VICODIN) 5-325 MG per tablet Take 1 tablet by mouth every 6 (six) hours as needed for moderate pain.   Yes [provider]  losartan (COZAAR) 100 MG tablet Take 0.5 tablets (50 mg total) by mouth daily. 11/03/16  Yes Florencia Reasons, MD  omeprazole (PRILOSEC) 20 MG capsule Take 40 mg by mouth 2 (two) times daily before a meal.    Yes [provider]  senna-docusate (SENOKOT-S) 8.6-50 MG tablet Take 1 tablet by mouth at bedtime. 11/02/16  Yes Florencia Reasons, MD  traMADol (ULTRAM) 50 MG tablet Take 50-100 mg by mouth 3 (three) times daily as needed for moderate pain or severe pain.  10/06/16  Yes [provider]  vitamin B-12 (CYANOCOBALAMIN) 1000 MCG tablet Take 1,000 mcg by mouth daily.   Yes [provider]  Magnesium Oxide 400 (240 Mg) MG TABS Take 30 tablets (12,000 mg total) by mouth daily at 2 PM. Patient not taking: Reported on 01/29/2017 11/02/16   Florencia Reasons, MD  methylPREDNISolone (MEDROL) 4 MG tablet Take dosepak as directed. Patient not taking: Reported on 01/29/2017 01/11/17   Meredith Pel, MD  Saw Palmetto, Serenoa repens, (SAW PALMETTO PO) Take 2 capsules by mouth daily.    [provider]     Allergies:     Allergies  Allergen Reactions  . Bee Venom      Physical Exam:   Vitals  Blood pressure (!) 176/107, pulse 96, temperature 97.7 F (36.5 C), temperature source Oral, resp. rate 14, SpO2 96  %.   Gen.: Elderly male lying in bed appears fatigued and retching HEENT:  Pupils reactive bilaterally, EOMI, dry oral mucosa, no pallor, no icterus, supple neck Chest: Clear to auscultation bilaterally, no added sounds CVS: Normal S1 and S2, no murmurs rub or gallop GI: Midline laparotomy scar, bowel sounds sluggish, mild distention, nontender Musculoskeletal: Warm, no edema  CNS: Alert and oriented   Data Review:    CBC  Recent Labs Lab 01/29/17 0920  WBC 13.4*  HGB 14.7  HCT 43.4  PLT 289  MCV 93.3  MCH 31.6  MCHC 33.9  RDW 12.3   ------------------------------------------------------------------------------------------------------------------  Chemistries   Recent Labs Lab 01/29/17 0920  NA 136  K 4.1  CL 99*  CO2 27  GLUCOSE 135*  BUN 13  CREATININE 1.14  CALCIUM 9.6  AST 19  ALT 12*  ALKPHOS 73  BILITOT 1.1   ------------------------------------------------------------------------------------------------------------------ CrCl cannot be calculated (Unknown ideal weight.). ------------------------------------------------------------------------------------------------------------------ No results for input(s): TSH, T4TOTAL, T3FREE, THYROIDAB in the last 72 hours.  Invalid input(s): FREET3  Coagulation profile No results for input(s): INR, PROTIME in the last 168 hours. ------------------------------------------------------------------------------------------------------------------- No results for input(s): DDIMER in the last 72 hours. -------------------------------------------------------------------------------------------------------------------  Cardiac Enzymes No results for input(s): CKMB, TROPONINI, MYOGLOBIN in the last 168 hours.  Invalid input(s): CK ------------------------------------------------------------------------------------------------------------------ No results found for:  BNP   ---------------------------------------------------------------------------------------------------------------  Urinalysis    Component Value Date/Time   COLORURINE YELLOW 01/29/2017 [place] 01/29/2017 0812   LABSPEC 1.025 01/29/2017 0812   PHURINE 6.0 01/29/2017 0812   GLUCOSEU NEGATIVE 01/29/2017 0812   HGBUR SMALL (A) 01/29/2017 0812   BILIRUBINUR NEGATIVE 01/29/2017 0812   KETONESUR NEGATIVE 01/29/2017 0812   PROTEINUR TRACE (A) 01/29/2017 0812   UROBILINOGEN 0.2 09/12/2014 1816   NITRITE NEGATIVE 01/29/2017 0812   LEUKOCYTESUR NEGATIVE 01/29/2017 0812    ----------------------------------------------------------------------------------------------------------------   Imaging Results:    Ct Abdomen Pelvis W Contrast  Result Date: 01/29/2017 CLINICAL DATA:  Generalized abdominal pain for several hours, initial encounter EXAM: CT ABDOMEN AND PELVIS WITH CONTRAST TECHNIQUE: Multidetector CT imaging of the abdomen and pelvis was performed using the standard protocol following bolus administration of intravenous contrast. CONTRAST:  100 mL ISOVUE-300 IOPAMIDOL (ISOVUE-300) INJECTION 61% COMPARISON:  10/31/2016 FINDINGS: Lower chest: Mild scarring is noted in the bases bilaterally. No focal confluent infiltrate is seen. Hepatobiliary: The liver is within normal limits. The gallbladder is well distended with multiple small dependent stones. Pancreas: Unremarkable. No pancreatic ductal dilatation or surrounding inflammatory changes. Spleen: Normal in size without focal abnormality. Adrenals/Urinary Tract: Renal cystic changes are noted in the left kidney. No obstructive changes are seen. The bladder is partially distended. Stomach/Bowel: There are changes consistent with prior esophageal surgery with gastric pull-through. These are incompletely evaluated on this exam. Scattered diverticular change of the colon is noted. The appendix is within normal limits.  Considerable small bowel dilatation is identified proximally with a transition zone in the mid abdomen just anterior to the aorta best seen on image number 41 of series 2 and image number or 46 of series 4. No definitive mass lesion is noted in this is likely related to adhesions. The more distal small bowel is within normal limits. Vascular/Lymphatic: Aortic atherosclerosis. No enlarged abdominal or pelvic lymph nodes. Reproductive: Prostate is enlarged. Other: Minimal free fluid is noted within the pelvis likely reactive in nature. No evidence of perforation is seen. No hernia is identified. Musculoskeletal: Degenerative changes of the lumbar spine are seen. IMPRESSION: Small bowel dilatation secondary to an area of transition in the mid abdomen likely related to  adhesions. Cholelithiasis without complicating factors. Chronic changes as described above. Electronically Signed   By: Inez Catalina M.D.   On: 01/29/2017 14:26    My personal review of EKG: None   Assessment & Plan:    Principal Problem:   Partial small bowel obstruction ([place]) Possibly associated with adhesions. Place in observation. Strict nothing by mouth. Serial abdominal exam. Place on IV hydration. Keep K >4 and magnesium >2. Pain control with when necessary IV morphine. I will hold off on NG tube placement at present. Supportive care with IV hydration and antiemetics. -[place] surgery consulted by ED physician.  Active Problems:   Essential hypertension Blood pressure elevated possibly due to pain. Monitor on when necessary IV hydralazine.  History of alcohol abuse Reports drinking small amounts every day. Will monitor on CIWA. No signs of withdrawal at present.  History of esophageal cancer Status post surgery in 2001.  OSA Continue nighttime CPAP      1.    DVT Prophylaxis: Lovenox  AM Labs Ordered, also please review Full Orders  Family Communication: Admission, patients condition and plan of care  including tests being ordered have been discussed with the patient at bedside   Code Status full code  Likely DC to  home  Condition : [place] called: [place] surgery   Admission status: Inpatient  Time spent in minutes : 50 Louellen Molder M.D on 01/29/2017 at 4:02 PM  Between 7am to 7pm - Pager - [phone]. After 7pm go to [email or web address] - password [facility]  [facility] - Office  [phone]

## 2017-01-30 ENCOUNTER — Inpatient Hospital Stay (HOSPITAL_COMMUNITY): Payer: Medicare Other

## 2017-01-30 DIAGNOSIS — K56609 Unspecified intestinal obstruction, unspecified as to partial versus complete obstruction: Secondary | ICD-10-CM

## 2017-01-30 DIAGNOSIS — Z8501 Personal history of malignant neoplasm of esophagus: Secondary | ICD-10-CM

## 2017-01-30 DIAGNOSIS — I10 Essential (primary) hypertension: Secondary | ICD-10-CM

## 2017-01-30 LAB — BASIC METABOLIC PANEL
Anion gap: 9 (ref 5–15)
BUN: 12 mg/dL (ref 6–20)
CHLORIDE: 104 mmol/L (ref 101–111)
CO2: 26 mmol/L (ref 22–32)
Calcium: 8.6 mg/dL — ABNORMAL LOW (ref 8.9–10.3)
Creatinine, Ser: 1.09 mg/dL (ref 0.61–1.24)
GFR calc Af Amer: >60 mL/min (ref >60)
GFR calc non Af Amer: >60 mL/min (ref >60)
GLUCOSE: 139 mg/dL — AB (ref 65–99)
POTASSIUM: 4 mmol/L (ref 3.5–5.1)
SODIUM: 139 mmol/L (ref 135–145)

## 2017-01-30 LAB — CBC
HEMATOCRIT: 40.4 % (ref 39.0–52.0)
Hemoglobin: 13.6 g/dL (ref 13.0–17.0)
MCH: 32 pg (ref 26.0–34.0)
MCHC: 33.7 g/dL (ref 30.0–36.0)
MCV: 95.1 fL (ref 78.0–100.0)
PLATELETS: 250 10*3/uL (ref 150–400)
RBC: 4.25 MIL/uL (ref 4.22–5.81)
RDW: 12.4 % (ref 11.5–15.5)
WBC: 18.8 10*3/uL — AB (ref 4.0–10.5)

## 2017-01-30 MED ORDER — IOPAMIDOL (ISOVUE-300) INJECTION 61%
INTRAVENOUS | Status: AC
  Filled 2017-01-30: qty 50

## 2017-01-30 MED ORDER — LIDOCAINE HCL 2 % EX GEL
CUTANEOUS | Status: AC
  Filled 2017-01-30: qty 30

## 2017-01-30 MED ORDER — LIP MEDEX EX OINT
TOPICAL_OINTMENT | CUTANEOUS | Status: AC
  Filled 2017-01-30: qty 7

## 2017-01-30 MED ORDER — BUTAMBEN-TETRACAINE-BENZOCAINE 2-2-14 % EX AERO
INHALATION_SPRAY | CUTANEOUS | Status: AC
  Filled 2017-01-30: qty 20

## 2017-01-30 NOTE — Progress Notes (Signed)
PROGRESS NOTE  Oscar [name] OAC:[id number] DOB: Mar 13, 1933 DOA: 01/29/2017 PCP: Deland Pretty, MD   LOS: 1 day   Brief Narrative / Interim history: 83 year old male with hypertension, remote esophageal cancer status post surgery in 2001, OSA, who was admitted to the hospital on 9/23 with abdominal pain, nausea vomiting and was diagnosed with a small bowel obstruction.  He had an episode of small bowel obstruction earlier this year which resolved with conservative management.  Assessment & Plan: Principal Problem:   Partial small bowel obstruction (HCC) Active Problems:   PERSONAL HISTORY MALIGNANT NEOPLASM ESOPHAGUS   HTN (hypertension)   Small bowel obstruction -General surgery consulted, appreciate input.  Does not look like patient has any improvement this morning.  NG tube placed by IR.  Concerning that his white count is increased to 18, will closely monitor.  No fevers.  Essential hypertension -Continue IV hydralazine  History of alcohol use -Reports drinking small amounts every day, monitor on CIWA, does not appear to be withdrawing  History of esophageal cancer -Post operative management in 2001, outpatient management  OSA -Continue nighttime CPAP   DVT prophylaxis: Lovenox Code Status: Full code Family Communication: no family at bedside Disposition Plan: TBD, home when ready   Consultants:   General surgery   Procedures:   None   Antimicrobials:  None    Subjective: -Continues to have abdominal discomfort, nausea, last episode of vomiting was last night. He is not passing any gas and has not had a bowel movement  Objective: Vitals:   01/29/17 1530 01/29/17 1600 01/29/17 2030 01/30/17 0439  BP: (!) 176/107 (!) 174/86 (!) 161/66 133/73  Pulse: 96 79 (!) 101 99  Resp:  18 18 18   Temp:  97.7 F (36.5 C) 100.2 F (37.9 C) 98.8 F (37.1 C)  TempSrc:  Oral Oral Oral  SpO2: 96% 99% 92% 99%    Intake/Output Summary (Last 24 hours) at 01/30/17  1054 Last data filed at 01/30/17 0440  Gross per 24 hour  Intake                0 ml  Output              500 ml  Net             -500 ml   There were no vitals filed for this visit.  Examination:  Constitutional: NAD Eyes: lids and conjunctivae normal Respiratory: clear to auscultation bilaterally, no wheezing, no crackles. Normal respiratory effort. No accessory muscle use.  Cardiovascular: Regular rate and rhythm, no murmurs / rubs / gallops. No LE edema. Abdomen: minimal tenderness.  Absent bowel sounds Neurologic: non focal  Psychiatric: Normal judgment and insight. Alert and oriented x 3. Normal mood.    Data Reviewed: I have independently reviewed following labs and imaging studies  CBC:  Recent Labs Lab 01/29/17 0920 01/30/17 0532  WBC 13.4* 18.8*  HGB 14.7 13.6  HCT 43.4 40.4  MCV 93.3 95.1  PLT 289 010   Basic Metabolic Panel:  Recent Labs Lab 01/29/17 0920 01/30/17 0532  NA 136 139  K 4.1 4.0  CL 99* 104  CO2 27 26  GLUCOSE 135* 139*  BUN 13 12  CREATININE 1.14 1.09  CALCIUM 9.6 8.6*   GFR: CrCl cannot be calculated (Unknown ideal weight.). Liver Function Tests:  Recent Labs Lab 01/29/17 0920  AST 19  ALT 12*  ALKPHOS 73  BILITOT 1.1  PROT 8.0  ALBUMIN 4.5    Recent  Labs Lab 01/29/17 0920  LIPASE 19   No results for input(s): AMMONIA in the last 168 hours. Coagulation Profile: No results for input(s): INR, PROTIME in the last 168 hours. Cardiac Enzymes: No results for input(s): CKTOTAL, CKMB, CKMBINDEX, TROPONINI in the last 168 hours. BNP (last 3 results) No results for input(s): PROBNP in the last 8760 hours. HbA1C: No results for input(s): HGBA1C in the last 72 hours. CBG: No results for input(s): GLUCAP in the last 168 hours. Lipid Profile: No results for input(s): CHOL, HDL, LDLCALC, TRIG, CHOLHDL, LDLDIRECT in the last 72 hours. Thyroid Function Tests: No results for input(s): TSH, T4TOTAL, FREET4, T3FREE, THYROIDAB  in the last 72 hours. Anemia Panel: No results for input(s): VITAMINB12, FOLATE, FERRITIN, TIBC, IRON, RETICCTPCT in the last 72 hours. Urine analysis:    Component Value Date/Time   COLORURINE YELLOW 01/29/2017 0812   APPEARANCEUR CLEAR 01/29/2017 0812   LABSPEC 1.025 01/29/2017 0812   PHURINE 6.0 01/29/2017 0812   GLUCOSEU NEGATIVE 01/29/2017 0812   HGBUR SMALL (A) 01/29/2017 0812   BILIRUBINUR NEGATIVE 01/29/2017 0812   KETONESUR NEGATIVE 01/29/2017 0812   PROTEINUR TRACE (A) 01/29/2017 0812   UROBILINOGEN 0.2 09/12/2014 1816   NITRITE NEGATIVE 01/29/2017 0812   LEUKOCYTESUR NEGATIVE 01/29/2017 0812   Sepsis Labs: Invalid input(s): PROCALCITONIN, LACTICIDVEN  No results found for this or any previous visit (from the past 240 hour(s)).    Radiology Studies: Ct Abdomen Pelvis W Contrast  Result Date: 01/29/2017 CLINICAL DATA:  Generalized abdominal pain for several hours, initial encounter EXAM: CT ABDOMEN AND PELVIS WITH CONTRAST TECHNIQUE: Multidetector CT imaging of the abdomen and pelvis was performed using the standard protocol following bolus administration of intravenous contrast. CONTRAST:  100 mL ISOVUE-300 IOPAMIDOL (ISOVUE-300) INJECTION 61% COMPARISON:  10/31/2016 FINDINGS: Lower chest: Mild scarring is noted in the bases bilaterally. No focal confluent infiltrate is seen. Hepatobiliary: The liver is within normal limits. The gallbladder is well distended with multiple small dependent stones. Pancreas: Unremarkable. No pancreatic ductal dilatation or surrounding inflammatory changes. Spleen: Normal in size without focal abnormality. Adrenals/Urinary Tract: Renal cystic changes are noted in the left kidney. No obstructive changes are seen. The bladder is partially distended. Stomach/Bowel: There are changes consistent with prior esophageal surgery with gastric pull-through. These are incompletely evaluated on this exam. Scattered diverticular change of the colon is noted. The  appendix is within normal limits. Considerable small bowel dilatation is identified proximally with a transition zone in the mid abdomen just anterior to the aorta best seen on image number 41 of series 2 and image number or 46 of series 4. No definitive mass lesion is noted in this is likely related to adhesions. The more distal small bowel is within normal limits. Vascular/Lymphatic: Aortic atherosclerosis. No enlarged abdominal or pelvic lymph nodes. Reproductive: Prostate is enlarged. Other: Minimal free fluid is noted within the pelvis likely reactive in nature. No evidence of perforation is seen. No hernia is identified. Musculoskeletal: Degenerative changes of the lumbar spine are seen. IMPRESSION: Small bowel dilatation secondary to an area of transition in the mid abdomen likely related to adhesions. Cholelithiasis without complicating factors. Chronic changes as described above. Electronically Signed   By: Inez Catalina M.D.   On: 01/29/2017 14:26     Scheduled Meds: . enoxaparin (LOVENOX) injection  40 mg Subcutaneous Q24H  . lip balm      . pantoprazole (PROTONIX) IV  40 mg Intravenous Q24H   Continuous Infusions: . dextrose 5 % and 0.9%  NaCl 100 mL/hr at 01/30/17 0245     Marzetta Board, MD, PhD [facility] Pager [phone]  If 7PM-7AM, please contact night-coverage [email or web address] Password [facility] 01/30/2017, 10:54 AM

## 2017-01-30 NOTE — Progress Notes (Signed)
Patient unable to be placed on CPAP due to nausea and vomiting. RT placed patient on 2 liters nasal cannula. Patient was tolerating well.

## 2017-01-30 NOTE — Progress Notes (Signed)
Pt unable to utilize CPAP tonight due to NG Tube in place.  CPAP remains at bedside for when Pt is able to utilize.  RT to monitor and assess as needed.

## 2017-01-30 NOTE — Progress Notes (Signed)
Patient ID: Oscar [name], male   DOB: November 05, 1932, 83 y.o.   MRN: [id number]  [facility] Surgery Progress Note     Subjective: CC- SBO Feeling slightly better than when he came into the hospital. Continues to have some crampy mid-abdominal pain. Persistent nausea, no emesis. He is burping. No flatus or BM.   Objective: Vital signs in last 24 hours: Temp:  [97.7 F (36.5 C)-100.2 F (37.9 C)] 98.8 F (37.1 C) (09/24 0439) Pulse Rate:  [51-101] 99 (09/24 0439) Resp:  [14-18] 18 (09/24 0439) BP: (133-197)/(66-107) 133/73 (09/24 0439) SpO2:  [91 %-100 %] 99 % (09/24 0439) Last BM Date: 01/28/17  Intake/Output from previous day: 09/23 0701 - 09/24 0700 In: 0  Out: 500 [Urine:500] Intake/Output this shift: No intake/output data recorded.  PE: Gen:  Alert, NAD, pleasant HEENT: EOM's intact, pupils equal and round Card:  RRR, no M/G/R heard Pulm:  CTAB, no W/R/R, effort normal Abd: well healed midline incision, soft, mild distension, few BS, mild lower abdominal tenderness Ext:  No erythema, edema, or tenderness BUE/BLE  Skin: no rashes noted, warm and dry  Lab Results:   Recent Labs  01/29/17 0920 01/30/17 0532  WBC 13.4* 18.8*  HGB 14.7 13.6  HCT 43.4 40.4  PLT 289 250   BMET  Recent Labs  01/29/17 0920 01/30/17 0532  NA 136 139  K 4.1 4.0  CL 99* 104  CO2 27 26  GLUCOSE 135* 139*  BUN 13 12  CREATININE 1.14 1.09  CALCIUM 9.6 8.6*   PT/INR No results for input(s): LABPROT, INR in the last 72 hours. CMP     Component Value Date/Time   NA 139 01/30/2017 0532   K 4.0 01/30/2017 0532   CL 104 01/30/2017 0532   CO2 26 01/30/2017 0532   GLUCOSE 139 (H) 01/30/2017 0532   BUN 12 01/30/2017 0532   CREATININE 1.09 01/30/2017 0532   CREATININE 1.13 12/07/2012 1147   CALCIUM 8.6 (L) 01/30/2017 0532   PROT 8.0 01/29/2017 0920   ALBUMIN 4.5 01/29/2017 0920   AST 19 01/29/2017 0920   ALT 12 (L) 01/29/2017 0920   ALKPHOS 73 01/29/2017 0920   BILITOT  1.1 01/29/2017 0920   GFRNONAA >60 01/30/2017 0532   GFRAA >60 01/30/2017 0532   Lipase     Component Value Date/Time   LIPASE 19 01/29/2017 0920       Studies/Results: Ct Abdomen Pelvis W Contrast  Result Date: 01/29/2017 CLINICAL DATA:  Generalized abdominal pain for several hours, initial encounter EXAM: CT ABDOMEN AND PELVIS WITH CONTRAST TECHNIQUE: Multidetector CT imaging of the abdomen and pelvis was performed using the standard protocol following bolus administration of intravenous contrast. CONTRAST:  100 mL ISOVUE-300 IOPAMIDOL (ISOVUE-300) INJECTION 61% COMPARISON:  10/31/2016 FINDINGS: Lower chest: Mild scarring is noted in the bases bilaterally. No focal confluent infiltrate is seen. Hepatobiliary: The liver is within normal limits. The gallbladder is well distended with multiple small dependent stones. Pancreas: Unremarkable. No pancreatic ductal dilatation or surrounding inflammatory changes. Spleen: Normal in size without focal abnormality. Adrenals/Urinary Tract: Renal cystic changes are noted in the left kidney. No obstructive changes are seen. The bladder is partially distended. Stomach/Bowel: There are changes consistent with prior esophageal surgery with gastric pull-through. These are incompletely evaluated on this exam. Scattered diverticular change of the colon is noted. The appendix is within normal limits. Considerable small bowel dilatation is identified proximally with a transition zone in the mid abdomen just anterior to the aorta best  seen on image number 41 of series 2 and image number or 46 of series 4. No definitive mass lesion is noted in this is likely related to adhesions. The more distal small bowel is within normal limits. Vascular/Lymphatic: Aortic atherosclerosis. No enlarged abdominal or pelvic lymph nodes. Reproductive: Prostate is enlarged. Other: Minimal free fluid is noted within the pelvis likely reactive in nature. No evidence of perforation is seen. No  hernia is identified. Musculoskeletal: Degenerative changes of the lumbar spine are seen. IMPRESSION: Small bowel dilatation secondary to an area of transition in the mid abdomen likely related to adhesions. Cholelithiasis without complicating factors. Chronic changes as described above. Electronically Signed   By: Inez Catalina M.D.   On: 01/29/2017 14:26    Anti-infectives: Anti-infectives    None       Assessment/Plan HTN H/o alcohol abuse  h/o esophageal cancer s/p transhiatal esophagectomy 2001 OSA  Partial SBO, likely due to adhesions - recurrent, h/o SBO 10/2016 resolved without surgery - CT scan 9/23 showed small bowel dilatation secondary to an area of transition in the mid abdomen likely related to adhesions - WBC up today 18.8 from 13.4, TMAX 100.2 - abdominal pain slightly better, no flatus or BM  ID - none FEN - IVF, NPO VTE - SCDs, lovenox Foley - none  Plan - NG tube to be placed in IR today. XR pending. Labs in AM.   LOS: 1 day    Wellington [place] , [facility] Surgery 01/30/2017, 8:20 AM Pager: [phone] Consults: [phone] Mon-Fri 7:00 am-4:30 pm Sat-Sun 7:00 am-11:30 am

## 2017-01-31 ENCOUNTER — Inpatient Hospital Stay (HOSPITAL_COMMUNITY): Payer: Medicare Other

## 2017-01-31 LAB — BASIC METABOLIC PANEL
ANION GAP: 6 (ref 5–15)
BUN: 9 mg/dL (ref 6–20)
CALCIUM: 8.5 mg/dL — AB (ref 8.9–10.3)
CO2: 29 mmol/L (ref 22–32)
Chloride: 108 mmol/L (ref 101–111)
Creatinine, Ser: 0.95 mg/dL (ref 0.61–1.24)
GFR calc Af Amer: >60 mL/min (ref >60)
GLUCOSE: 139 mg/dL — AB (ref 65–99)
Potassium: 3.8 mmol/L (ref 3.5–5.1)
Sodium: 143 mmol/L (ref 135–145)

## 2017-01-31 LAB — CBC
HEMATOCRIT: 38.4 % — AB (ref 39.0–52.0)
HEMOGLOBIN: 12.7 g/dL — AB (ref 13.0–17.0)
MCH: 32.1 pg (ref 26.0–34.0)
MCHC: 33.1 g/dL (ref 30.0–36.0)
MCV: 97 fL (ref 78.0–100.0)
PLATELETS: 234 10*3/uL (ref 150–400)
RBC: 3.96 MIL/uL — AB (ref 4.22–5.81)
RDW: 12.4 % (ref 11.5–15.5)
WBC: 11.8 10*3/uL — ABNORMAL HIGH (ref 4.0–10.5)

## 2017-01-31 MED ORDER — DIATRIZOATE MEGLUMINE & SODIUM 66-10 % PO SOLN
90.0000 mL | Freq: Once | ORAL | Status: AC
  Administered 2017-01-31: 90 mL via NASOGASTRIC
  Filled 2017-01-31: qty 90

## 2017-01-31 MED ORDER — ORAL CARE MOUTH RINSE
15.0000 mL | Freq: Two times a day (BID) | OROMUCOSAL | Status: DC
  Administered 2017-01-31 – 2017-02-02 (×5): 15 mL via OROMUCOSAL

## 2017-01-31 NOTE — Progress Notes (Signed)
PROGRESS NOTE  Oscar [name] PYK:[id number] DOB: 1933/04/19 DOA: 01/29/2017 PCP: Deland Pretty, MD   LOS: 2 days   Brief Narrative / Interim history: 83 year old male with hypertension, remote esophageal cancer status post surgery in 2001, OSA, who was admitted to the hospital on 9/23 with abdominal pain, nausea vomiting and was diagnosed with a small bowel obstruction.  He had an episode of small bowel obstruction earlier this year which resolved with conservative management.  Assessment & Plan: Principal Problem:   Partial small bowel obstruction (HCC) Active Problems:   PERSONAL HISTORY MALIGNANT NEOPLASM ESOPHAGUS   HTN (hypertension)   Small bowel obstruction -General surgery consulted, appreciate input.  NG tube placed by IR yesterday, symptomatically patient is better today.  Past little bit of flatus overnight but no BMs.  White count has improved today, from 65 yesterday to 11 today  Essential hypertension -Continue IV hydralazine  History of alcohol use -Reports drinking small amounts every day, monitor on CIWA, does not appear to be withdrawing  History of esophageal cancer -Post operative management in 2001, outpatient management  OSA -Continue nighttime CPAP   DVT prophylaxis: Lovenox Code Status: Full code Family Communication: no family at bedside Disposition Plan: TBD, home when ready   Consultants:   General surgery   Procedures:   None   Antimicrobials:  None    Subjective: - no chest pain, shortness of breath, abdominal pain improved, no nausea or vomiting.   Objective: Vitals:   01/30/17 0439 01/30/17 1419 01/30/17 2113 01/31/17 0505  BP: 133/73 (!) 162/75 (!) 172/79 (!) 152/78  Pulse: 99 78 80 77  Resp: 18 18 18 16   Temp: 98.8 F (37.1 C) 97.7 F (36.5 C) 98.9 F (37.2 C) 98.4 F (36.9 C)  TempSrc: Oral Oral Oral Oral  SpO2: 99% 98% 95% 98%    Intake/Output Summary (Last 24 hours) at 01/31/17 1247 Last data filed at 01/31/17  1030  Gross per 24 hour  Intake             2400 ml  Output             1150 ml  Net             1250 ml   There were no vitals filed for this visit.  Examination:  Constitutional: NAD Respiratory: CTA Cardiovascular: RRR   Data Reviewed: I have independently reviewed following labs and imaging studies  CBC:  Recent Labs Lab 01/29/17 0920 01/30/17 0532 01/31/17 0529  WBC 13.4* 18.8* 11.8*  HGB 14.7 13.6 12.7*  HCT 43.4 40.4 38.4*  MCV 93.3 95.1 97.0  PLT 289 250 539   Basic Metabolic Panel:  Recent Labs Lab 01/29/17 0920 01/30/17 0532 01/31/17 0529  NA 136 139 143  K 4.1 4.0 3.8  CL 99* 104 108  CO2 27 26 29   GLUCOSE 135* 139* 139*  BUN 13 12 9   CREATININE 1.14 1.09 0.95  CALCIUM 9.6 8.6* 8.5*   GFR: CrCl cannot be calculated (Unknown ideal weight.). Liver Function Tests:  Recent Labs Lab 01/29/17 0920  AST 19  ALT 12*  ALKPHOS 73  BILITOT 1.1  PROT 8.0  ALBUMIN 4.5    Recent Labs Lab 01/29/17 0920  LIPASE 19   No results for input(s): AMMONIA in the last 168 hours. Coagulation Profile: No results for input(s): INR, PROTIME in the last 168 hours. Cardiac Enzymes: No results for input(s): CKTOTAL, CKMB, CKMBINDEX, TROPONINI in the last 168 hours. BNP (last 3 results)  No results for input(s): PROBNP in the last 8760 hours. HbA1C: No results for input(s): HGBA1C in the last 72 hours. CBG: No results for input(s): GLUCAP in the last 168 hours. Lipid Profile: No results for input(s): CHOL, HDL, LDLCALC, TRIG, CHOLHDL, LDLDIRECT in the last 72 hours. Thyroid Function Tests: No results for input(s): TSH, T4TOTAL, FREET4, T3FREE, THYROIDAB in the last 72 hours. Anemia Panel: No results for input(s): VITAMINB12, FOLATE, FERRITIN, TIBC, IRON, RETICCTPCT in the last 72 hours. Urine analysis:    Component Value Date/Time   COLORURINE YELLOW 01/29/2017 0812   APPEARANCEUR CLEAR 01/29/2017 0812   LABSPEC 1.025 01/29/2017 0812   PHURINE 6.0  01/29/2017 0812   GLUCOSEU NEGATIVE 01/29/2017 0812   HGBUR SMALL (A) 01/29/2017 0812   BILIRUBINUR NEGATIVE 01/29/2017 0812   KETONESUR NEGATIVE 01/29/2017 0812   PROTEINUR TRACE (A) 01/29/2017 0812   UROBILINOGEN 0.2 09/12/2014 1816   NITRITE NEGATIVE 01/29/2017 0812   LEUKOCYTESUR NEGATIVE 01/29/2017 0812   Sepsis Labs: Invalid input(s): PROCALCITONIN, LACTICIDVEN  No results found for this or any previous visit (from the past 240 hour(s)).    Radiology Studies: Ct Abdomen Pelvis W Contrast  Result Date: 01/29/2017 CLINICAL DATA:  Generalized abdominal pain for several hours, initial encounter EXAM: CT ABDOMEN AND PELVIS WITH CONTRAST TECHNIQUE: Multidetector CT imaging of the abdomen and pelvis was performed using the standard protocol following bolus administration of intravenous contrast. CONTRAST:  100 mL ISOVUE-300 IOPAMIDOL (ISOVUE-300) INJECTION 61% COMPARISON:  10/31/2016 FINDINGS: Lower chest: Mild scarring is noted in the bases bilaterally. No focal confluent infiltrate is seen. Hepatobiliary: The liver is within normal limits. The gallbladder is well distended with multiple small dependent stones. Pancreas: Unremarkable. No pancreatic ductal dilatation or surrounding inflammatory changes. Spleen: Normal in size without focal abnormality. Adrenals/Urinary Tract: Renal cystic changes are noted in the left kidney. No obstructive changes are seen. The bladder is partially distended. Stomach/Bowel: There are changes consistent with prior esophageal surgery with gastric pull-through. These are incompletely evaluated on this exam. Scattered diverticular change of the colon is noted. The appendix is within normal limits. Considerable small bowel dilatation is identified proximally with a transition zone in the mid abdomen just anterior to the aorta best seen on image number 41 of series 2 and image number or 46 of series 4. No definitive mass lesion is noted in this is likely related to  adhesions. The more distal small bowel is within normal limits. Vascular/Lymphatic: Aortic atherosclerosis. No enlarged abdominal or pelvic lymph nodes. Reproductive: Prostate is enlarged. Other: Minimal free fluid is noted within the pelvis likely reactive in nature. No evidence of perforation is seen. No hernia is identified. Musculoskeletal: Degenerative changes of the lumbar spine are seen. IMPRESSION: Small bowel dilatation secondary to an area of transition in the mid abdomen likely related to adhesions. Cholelithiasis without complicating factors. Chronic changes as described above. Electronically Signed   By: Inez Catalina M.D.   On: 01/29/2017 14:26   Dg Abd 2 Views  Result Date: 01/30/2017 CLINICAL DATA:  Nausea and vomiting. History of small bowel obstruction. EXAM: ABDOMEN - 2 VIEW COMPARISON:  CT of 1 day prior. FINDINGS: Upright and supine films of the abdomen and pelvis. Upright views demonstrate small bowel air-fluid levels. Probable scarring in both lung bases. No gross free intraperitoneal air. Supine images demonstrate gas within the rectum. Proximal small bowel dilatation including at 5.1 cm. Similar to on the prior CT. No pneumatosis. IMPRESSION: Similar appearance of partial small bowel obstruction. No free  intraperitoneal air or other acute complication identified. Electronically Signed   By: Abigail Miyamoto M.D.   On: 01/30/2017 14:24   Dg Addison Bailey G Tube Plc W/fl W/rad  Result Date: 01/30/2017 CLINICAL DATA:  Small bowel obstruction. Prior gastric pull-through for esophageal cancer. EXAM: NASO G TUBE PLACEMENT WITH FL AND WITH RAD CONTRAST:  None FLUOROSCOPY TIME:  Fluoroscopy Time:  1.4 minutes Radiation Exposure Index (if provided by the fluoroscopic device): 5.7 mGy Number of Acquired Spot Images: 0 COMPARISON:  None. FINDINGS: The NG tube was placed under fluoroscopic guidance. The tip was placed at the level of the diaphragm with the side port in the gastric pull-through in the lower  chest. IMPRESSION: NG tube placed under fluoroscopic guidance as above. Electronically Signed   By: Rolm Baptise M.D.   On: 01/30/2017 15:24     Scheduled Meds: . enoxaparin (LOVENOX) injection  40 mg Subcutaneous Q24H  . mouth rinse  15 mL Mouth Rinse BID  . pantoprazole (PROTONIX) IV  40 mg Intravenous Q24H   Continuous Infusions: . dextrose 5 % and 0.9% NaCl 100 mL/hr (01/31/17 0048)     Marzetta Board, MD, PhD [facility] Pager [phone]  If 7PM-7AM, please contact night-coverage [email or web address] Password [facility] 01/31/2017, 12:47 PM

## 2017-01-31 NOTE — Progress Notes (Signed)
Pt still has NG tube in place and wants to defer CPAP for now.  RT to monitor and assess as needed.

## 2017-01-31 NOTE — Progress Notes (Signed)
Patient ID: Oscar [name], male   DOB: July 16, 1932, 83 y.o.   MRN: [id number]  [name] Surgery Progress Note     Subjective: CC- SBO Small amt of flatus overnight.    Objective: Vital signs in last 24 hours: Temp:  [97.7 F (36.5 C)-98.9 F (37.2 C)] 98.4 F (36.9 C) (09/25 0505) Pulse Rate:  [77-80] 77 (09/25 0505) Resp:  [16-18] 16 (09/25 0505) BP: (152-172)/(75-79) 152/78 (09/25 0505) SpO2:  [95 %-98 %] 98 % (09/25 0505) Last BM Date: 01/28/17  Intake/Output from previous day: 09/24 0701 - 09/25 0700 In: 2308.3 [I.V.:2308.3] Out: 1175 [Urine:525; Emesis/NG output:650] Intake/Output this shift: Total I/O In: -  Out: 100 [Emesis/NG output:100]  PE: Gen:  Alert, NAD, pleasant HEENT: EOM's intact, pupils equal and round Card:  RRR, no M/G/R heard Pulm:  CTAB, no W/R/R, effort normal Abd: well healed midline incision, soft, mild distension, few BS, mild lower abdominal tenderness Ext:  No erythema, edema, or tenderness BUE/BLE  Skin: no rashes noted, warm and dry  Lab Results:   Recent Labs  01/30/17 0532 01/31/17 0529  WBC 18.8* 11.8*  HGB 13.6 12.7*  HCT 40.4 38.4*  PLT 250 234   BMET  Recent Labs  01/30/17 0532 01/31/17 0529  NA 139 143  K 4.0 3.8  CL 104 108  CO2 26 29  GLUCOSE 139* 139*  BUN 12 9  CREATININE 1.09 0.95  CALCIUM 8.6* 8.5*   PT/INR No results for input(s): LABPROT, INR in the last 72 hours. CMP     Component Value Date/Time   NA 143 01/31/2017 0529   K 3.8 01/31/2017 0529   CL 108 01/31/2017 0529   CO2 29 01/31/2017 0529   GLUCOSE 139 (H) 01/31/2017 0529   BUN 9 01/31/2017 0529   CREATININE 0.95 01/31/2017 0529   CREATININE 1.13 12/07/2012 1147   CALCIUM 8.5 (L) 01/31/2017 0529   PROT 8.0 01/29/2017 0920   ALBUMIN 4.5 01/29/2017 0920   AST 19 01/29/2017 0920   ALT 12 (L) 01/29/2017 0920   ALKPHOS 73 01/29/2017 0920   BILITOT 1.1 01/29/2017 0920   GFRNONAA >60 01/31/2017 0529   GFRAA >60 01/31/2017 0529    Lipase     Component Value Date/Time   LIPASE 19 01/29/2017 0920       Studies/Results: Ct Abdomen Pelvis W Contrast  Result Date: 01/29/2017 CLINICAL DATA:  Generalized abdominal pain for several hours, initial encounter EXAM: CT ABDOMEN AND PELVIS WITH CONTRAST TECHNIQUE: Multidetector CT imaging of the abdomen and pelvis was performed using the standard protocol following bolus administration of intravenous contrast. CONTRAST:  100 mL ISOVUE-300 IOPAMIDOL (ISOVUE-300) INJECTION 61% COMPARISON:  10/31/2016 FINDINGS: Lower chest: Mild scarring is noted in the bases bilaterally. No focal confluent infiltrate is seen. Hepatobiliary: The liver is within normal limits. The gallbladder is well distended with multiple small dependent stones. Pancreas: Unremarkable. No pancreatic ductal dilatation or surrounding inflammatory changes. Spleen: Normal in size without focal abnormality. Adrenals/Urinary Tract: Renal cystic changes are noted in the left kidney. No obstructive changes are seen. The bladder is partially distended. Stomach/Bowel: There are changes consistent with prior esophageal surgery with gastric pull-through. These are incompletely evaluated on this exam. Scattered diverticular change of the colon is noted. The appendix is within normal limits. Considerable small bowel dilatation is identified proximally with a transition zone in the mid abdomen just anterior to the aorta best seen on image number 41 of series 2 and image number or 46 of series  4. No definitive mass lesion is noted in this is likely related to adhesions. The more distal small bowel is within normal limits. Vascular/Lymphatic: Aortic atherosclerosis. No enlarged abdominal or pelvic lymph nodes. Reproductive: Prostate is enlarged. Other: Minimal free fluid is noted within the pelvis likely reactive in nature. No evidence of perforation is seen. No hernia is identified. Musculoskeletal: Degenerative changes of the lumbar spine  are seen. IMPRESSION: Small bowel dilatation secondary to an area of transition in the mid abdomen likely related to adhesions. Cholelithiasis without complicating factors. Chronic changes as described above. Electronically Signed   By: Inez Catalina M.D.   On: 01/29/2017 14:26   Dg Abd 2 Views  Result Date: 01/30/2017 CLINICAL DATA:  Nausea and vomiting. History of small bowel obstruction. EXAM: ABDOMEN - 2 VIEW COMPARISON:  CT of 1 day prior. FINDINGS: Upright and supine films of the abdomen and pelvis. Upright views demonstrate small bowel air-fluid levels. Probable scarring in both lung bases. No gross free intraperitoneal air. Supine images demonstrate gas within the rectum. Proximal small bowel dilatation including at 5.1 cm. Similar to on the prior CT. No pneumatosis. IMPRESSION: Similar appearance of partial small bowel obstruction. No free intraperitoneal air or other acute complication identified. Electronically Signed   By: Abigail Miyamoto M.D.   On: 01/30/2017 14:24   Dg Addison Bailey G Tube Plc W/fl W/rad  Result Date: 01/30/2017 CLINICAL DATA:  Small bowel obstruction. Prior gastric pull-through for esophageal cancer. EXAM: NASO G TUBE PLACEMENT WITH FL AND WITH RAD CONTRAST:  None FLUOROSCOPY TIME:  Fluoroscopy Time:  1.4 minutes Radiation Exposure Index (if provided by the fluoroscopic device): 5.7 mGy Number of Acquired Spot Images: 0 COMPARISON:  None. FINDINGS: The NG tube was placed under fluoroscopic guidance. The tip was placed at the level of the diaphragm with the side port in the gastric pull-through in the lower chest. IMPRESSION: NG tube placed under fluoroscopic guidance as above. Electronically Signed   By: Rolm Baptise M.D.   On: 01/30/2017 15:24    Anti-infectives: Anti-infectives    None       Assessment/Plan HTN H/o alcohol abuse  h/o esophageal cancer s/p transhiatal esophagectomy 2001 OSA  Partial SBO, likely due to adhesions - recurrent, h/o SBO 10/2016 resolved without  surgery - CT scan 9/23 showed small bowel dilatation secondary to an area of transition in the mid abdomen likely related to adhesions - WBC down to 11.8 - abdominal pain slightly better, some flatus  ID - none FEN - IVF, NPO VTE - SCDs, lovenox Foley - none  Plan - start SB protocol   LOS: 2 days    Clovis Riley , [facility] 01/31/2017, 10:35 AM

## 2017-02-01 ENCOUNTER — Inpatient Hospital Stay (HOSPITAL_COMMUNITY): Payer: Medicare Other

## 2017-02-01 DIAGNOSIS — K56600 Partial intestinal obstruction, unspecified as to cause: Secondary | ICD-10-CM

## 2017-02-01 LAB — CBC
HCT: 37.4 % — ABNORMAL LOW (ref 39.0–52.0)
Hemoglobin: 12.4 g/dL — ABNORMAL LOW (ref 13.0–17.0)
MCH: 31.8 pg (ref 26.0–34.0)
MCHC: 33.2 g/dL (ref 30.0–36.0)
MCV: 95.9 fL (ref 78.0–100.0)
Platelets: 225 10*3/uL (ref 150–400)
RBC: 3.9 MIL/uL — AB (ref 4.22–5.81)
RDW: 12.2 % (ref 11.5–15.5)
WBC: 10.9 10*3/uL — AB (ref 4.0–10.5)

## 2017-02-01 LAB — BASIC METABOLIC PANEL
Anion gap: 11 (ref 5–15)
BUN: 8 mg/dL (ref 6–20)
CHLORIDE: 108 mmol/L (ref 101–111)
CO2: 23 mmol/L (ref 22–32)
CREATININE: 0.87 mg/dL (ref 0.61–1.24)
Calcium: 8.5 mg/dL — ABNORMAL LOW (ref 8.9–10.3)
GFR calc non Af Amer: >60 mL/min (ref >60)
Glucose, Bld: 130 mg/dL — ABNORMAL HIGH (ref 65–99)
POTASSIUM: 3.5 mmol/L (ref 3.5–5.1)
Sodium: 142 mmol/L (ref 135–145)

## 2017-02-01 LAB — MAGNESIUM: MAGNESIUM: 1.5 mg/dL — AB (ref 1.7–2.4)

## 2017-02-01 MED ORDER — SODIUM CHLORIDE 0.9 % IV SOLN: 1.0000 mg | Freq: Once | INTRAVENOUS | Status: DC

## 2017-02-01 MED ORDER — FOLIC ACID 5 MG/ML IJ SOLN
1.0000 mg | Freq: Once | INTRAMUSCULAR | Status: AC
  Administered 2017-02-01: 1 mg via INTRAVENOUS
  Filled 2017-02-01: qty 0.2

## 2017-02-01 MED ORDER — MAGNESIUM SULFATE 2 GM/50ML IV SOLN
2.0000 g | Freq: Once | INTRAVENOUS | Status: AC
  Administered 2017-02-01: 2 g via INTRAVENOUS
  Filled 2017-02-01: qty 50

## 2017-02-01 MED ORDER — THIAMINE HCL 100 MG/ML IJ SOLN
100.0000 mg | Freq: Every day | INTRAMUSCULAR | Status: DC
  Administered 2017-02-01 – 2017-02-03 (×3): 100 mg via INTRAVENOUS
  Filled 2017-02-01 (×3): qty 2

## 2017-02-01 MED ORDER — HYDRALAZINE HCL 20 MG/ML IJ SOLN: 10.0000 mg | Freq: Four times a day (QID) | INTRAMUSCULAR | Status: DC | PRN

## 2017-02-01 NOTE — Progress Notes (Signed)
Pt. Remains with NG in place.  CPAP not placed at this time second to that.

## 2017-02-01 NOTE — Progress Notes (Signed)
Patient ID: Oscar [name], male   DOB: 11-Sep-1932, 83 y.o.   MRN: [id number]  [facility] Surgery Progress Note     Subjective: CC- SBO Patient states that he has less abdominal pain today. He is passing flatus. No BM. Denies n/v. States that he did not get OOB yesterday.  Objective: Vital signs in last 24 hours: Temp:  [98.4 F (36.9 C)-99.6 F (37.6 C)] 99 F (37.2 C) (09/26 0526) Pulse Rate:  [68-73] 68 (09/26 0526) Resp:  [16-18] 16 (09/26 0526) BP: (149-184)/(58-70) 157/70 (09/26 0526) SpO2:  [94 %-97 %] 96 % (09/26 0526) Weight:  [203 lb 8 oz (92.3 kg)] 203 lb 8 oz (92.3 kg) (09/25 1500) Last BM Date: 01/28/17  Intake/Output from previous day: 09/25 0701 - 09/26 0700 In: 2591.7 [I.V.:2591.7] Out: 1875 [Urine:1025; Emesis/NG output:850] Intake/Output this shift: No intake/output data recorded.  PE: Gen:  Alert, NAD HEENT: EOM's intact, pupils equal and round Card:  RRR, no M/G/R heard Pulm:  CTAB, no W/R/R, effort normal Abd: well healed midline incision, soft, nondistended, +BS, mild upper abdominal tenderness Ext:  No erythema, edema, or tenderness BUE/BLE  Skin: no rashes noted, warm and dry  Lab Results:   Recent Labs  01/31/17 0529 02/01/17 0450  WBC 11.8* 10.9*  HGB 12.7* 12.4*  HCT 38.4* 37.4*  PLT 234 225   BMET  Recent Labs  01/31/17 0529 02/01/17 0450  NA 143 142  K 3.8 3.5  CL 108 108  CO2 29 23  GLUCOSE 139* 130*  BUN 9 8  CREATININE 0.95 0.87  CALCIUM 8.5* 8.5*   PT/INR No results for input(s): LABPROT, INR in the last 72 hours. CMP     Component Value Date/Time   NA 142 02/01/2017 0450   K 3.5 02/01/2017 0450   CL 108 02/01/2017 0450   CO2 23 02/01/2017 0450   GLUCOSE 130 (H) 02/01/2017 0450   BUN 8 02/01/2017 0450   CREATININE 0.87 02/01/2017 0450   CREATININE 1.13 12/07/2012 1147   CALCIUM 8.5 (L) 02/01/2017 0450   PROT 8.0 01/29/2017 0920   ALBUMIN 4.5 01/29/2017 0920   AST 19 01/29/2017 0920   ALT 12 (L)  01/29/2017 0920   ALKPHOS 73 01/29/2017 0920   BILITOT 1.1 01/29/2017 0920   GFRNONAA >60 02/01/2017 0450   GFRAA >60 02/01/2017 0450   Lipase     Component Value Date/Time   LIPASE 19 01/29/2017 0920       Studies/Results: Dg Abd 2 Views  Result Date: 01/30/2017 CLINICAL DATA:  Nausea and vomiting. History of small bowel obstruction. EXAM: ABDOMEN - 2 VIEW COMPARISON:  CT of 1 day prior. FINDINGS: Upright and supine films of the abdomen and pelvis. Upright views demonstrate small bowel air-fluid levels. Probable scarring in both lung bases. No gross free intraperitoneal air. Supine images demonstrate gas within the rectum. Proximal small bowel dilatation including at 5.1 cm. Similar to on the prior CT. No pneumatosis. IMPRESSION: Similar appearance of partial small bowel obstruction. No free intraperitoneal air or other acute complication identified. Electronically Signed   By: Abigail Miyamoto M.D.   On: 01/30/2017 14:24   Dg Abd Portable 1v-small Bowel Obstruction Protocol-initial, 8 Hr Delay  Result Date: 01/31/2017 CLINICAL DATA:  8 hour delay, small bowel protocol EXAM: PORTABLE ABDOMEN - 1 VIEW COMPARISON:  01/30/2017, CT 01/29/2017 FINDINGS: Tiny left pleural effusion with basilar atelectasis. Esophageal to tip appears to project over the distal mediastinum, patient has history of gastric pull through. Multiple  surgical clips in the upper abdomen. Persistent dilatation of central small bowel loops, measuring up to 4.4 cm with contrast present mainly within the dilated small bowel. No significant colon or rectal contrast. Calcified phleboliths in the pelvis. IMPRESSION: Similar appearance of dilated central small bowel loops, now containing contrast. No significant contrast in the colon or rectum. Findings would be consistent with small bowel obstruction. Electronically Signed   By: Donavan Foil M.D.   On: 01/31/2017 21:39   Dg [name] Dys Tube Plc W/fl W/rad  Result Date:  01/30/2017 CLINICAL DATA:  Small bowel obstruction. Prior gastric pull-through for esophageal cancer. EXAM: NASO G TUBE PLACEMENT WITH FL AND WITH RAD CONTRAST:  None FLUOROSCOPY TIME:  Fluoroscopy Time:  1.4 minutes Radiation Exposure Index (if provided by the fluoroscopic device): 5.7 mGy Number of Acquired Spot Images: 0 COMPARISON:  None. FINDINGS: The NG tube was placed under fluoroscopic guidance. The tip was placed at the level of the diaphragm with the side port in the gastric pull-through in the lower chest. IMPRESSION: NG tube placed under fluoroscopic guidance as above. Electronically Signed   By: Rolm Baptise M.D.   On: 01/30/2017 15:24    Anti-infectives: Anti-infectives    None       Assessment/Plan HTN H/o alcohol abuse H/o esophageal cancer s/p transhiatal esophagectomy 2001 OSA  Partial SBO, likely due to adhesions - recurrent, h/o SBO 10/2016 resolved without surgery - CT scan 9/23 showed small bowel dilatation secondary to an area of transition in the mid abdomen likely related to adhesions - WBC continues to trend down 10.9 - XR showed contrast in small bowel  - passing flatus, no BM  ID - none FEN - IVF, NPO VTE - SCDs, lovenox Foley - none  Plan - Continue NPO/NGT. Encouraged ambulation today. Consult PT. Plan for repeat abdominal xray in the AM.   LOS: 3 days    Wellington Hampshire , [facility] 02/01/2017, 7:34 AM Pager: [phone] Consults: [phone] Mon-Fri 7:00 am-4:30 pm Sat-Sun 7:00 am-11:30 am

## 2017-02-01 NOTE — Progress Notes (Signed)
PROGRESS NOTE  Oscar [name] OEV:[id number] DOB: 10-Jun-1932 DOA: 01/29/2017 PCP: Deland Pretty, MD   LOS: 3 days   Brief Narrative / Interim history: 83 year old male with hypertension, remote esophageal cancer status post surgery in 2001, OSA, who was admitted to the hospital on 9/23 with abdominal pain, nausea vomiting and was diagnosed with a small bowel obstruction.  He had an episode of small bowel obstruction earlier this year which resolved with conservative management.  Assessment & Plan: Principal Problem:   Partial small bowel obstruction (HCC) Active Problems:   PERSONAL HISTORY MALIGNANT NEOPLASM ESOPHAGUS   HTN (hypertension)   Small bowel obstruction -General surgery consulted, appreciate input.  NG tube placed by IR.  -KUB with persistent SBO.  -He report BM , "good one" -NG tube with dark liquid.  -plan to repeat KUB in Am if no better might need exploratory lap.   Essential hypertension -Continue IV hydralazine  History of alcohol use -Reports drinking small amounts every day, monitor on CIWA, does not appear to be withdrawing -thiamine and folate.   History of esophageal cancer -Post operative management in 2001, outpatient management  OSA -Continue nighttime CPAP   DVT prophylaxis: Lovenox Code Status: Full code Family Communication: no family at bedside Disposition Plan: TBD, home when ready   Consultants:   General surgery   Procedures:   None   Antimicrobials:  None    Subjective: He report some abdominal pain, decline pain meds.  Report one BM.   Objective: Vitals:   01/31/17 1500 01/31/17 2117 02/01/17 0526 02/01/17 1421  BP:  (!) 149/58 (!) 157/70 (!) 175/71  Pulse:  73 68 66  Resp:  18 16 16   Temp:  99.6 F (37.6 C) 99 F (37.2 C) (!) 97.5 F (36.4 C)  TempSrc:  Oral Oral Oral  SpO2:  94% 96% 100%  Weight: 92.3 kg (203 lb 8 oz)     Height: 6\' 1"  (1.854 m)       Intake/Output Summary (Last 24 hours) at 02/01/17  1439 Last data filed at 02/01/17 1112  Gross per 24 hour  Intake             1700 ml  Output             1775 ml  Net              -75 ml   Filed Weights   01/31/17 1500  Weight: 92.3 kg (203 lb 8 oz)    Examination:  Constitutional: NAD, NG tube in place.  Respiratory: CTA, Cardiovascular: S 1, S 2 RRR Abdomen; distended, mild tenderness.    Data Reviewed: I have independently reviewed following labs and imaging studies  CBC:  Recent Labs Lab 01/29/17 0920 01/30/17 0532 01/31/17 0529 02/01/17 0450  WBC 13.4* 18.8* 11.8* 10.9*  HGB 14.7 13.6 12.7* 12.4*  HCT 43.4 40.4 38.4* 37.4*  MCV 93.3 95.1 97.0 95.9  PLT 289 250 234 829   Basic Metabolic Panel:  Recent Labs Lab 01/29/17 0920 01/30/17 0532 01/31/17 0529 02/01/17 0450  NA 136 139 143 142  K 4.1 4.0 3.8 3.5  CL 99* 104 108 108  CO2 27 26 29 23   GLUCOSE 135* 139* 139* 130*  BUN 13 12 9 8   CREATININE 1.14 1.09 0.95 0.87  CALCIUM 9.6 8.6* 8.5* 8.5*  MG  --   --   --  1.5*   GFR: Estimated Creatinine Clearance: 75.3 mL/min (by C-G formula based on SCr of 0.87  mg/dL). Liver Function Tests:  Recent Labs Lab 01/29/17 0920  AST 19  ALT 12*  ALKPHOS 73  BILITOT 1.1  PROT 8.0  ALBUMIN 4.5    Recent Labs Lab 01/29/17 0920  LIPASE 19   No results for input(s): AMMONIA in the last 168 hours. Coagulation Profile: No results for input(s): INR, PROTIME in the last 168 hours. Cardiac Enzymes: No results for input(s): CKTOTAL, CKMB, CKMBINDEX, TROPONINI in the last 168 hours. BNP (last 3 results) No results for input(s): PROBNP in the last 8760 hours. HbA1C: No results for input(s): HGBA1C in the last 72 hours. CBG: No results for input(s): GLUCAP in the last 168 hours. Lipid Profile: No results for input(s): CHOL, HDL, LDLCALC, TRIG, CHOLHDL, LDLDIRECT in the last 72 hours. Thyroid Function Tests: No results for input(s): TSH, T4TOTAL, FREET4, T3FREE, THYROIDAB in the last 72 hours. Anemia  Panel: No results for input(s): VITAMINB12, FOLATE, FERRITIN, TIBC, IRON, RETICCTPCT in the last 72 hours. Urine analysis:    Component Value Date/Time   COLORURINE YELLOW 01/29/2017 0812   APPEARANCEUR CLEAR 01/29/2017 0812   LABSPEC 1.025 01/29/2017 0812   PHURINE 6.0 01/29/2017 0812   GLUCOSEU NEGATIVE 01/29/2017 0812   HGBUR SMALL (A) 01/29/2017 0812   BILIRUBINUR NEGATIVE 01/29/2017 0812   KETONESUR NEGATIVE 01/29/2017 0812   PROTEINUR TRACE (A) 01/29/2017 0812   UROBILINOGEN 0.2 09/12/2014 1816   NITRITE NEGATIVE 01/29/2017 0812   LEUKOCYTESUR NEGATIVE 01/29/2017 0812   Sepsis Labs: Invalid input(s): PROCALCITONIN, LACTICIDVEN  No results found for this or any previous visit (from the past 240 hour(s)).    Radiology Studies: Dg Abd Portable 1v-small Bowel Obstruction Protocol-24 Hr Delay  Result Date: 02/01/2017 CLINICAL DATA:  24 hour follow-up examination for small-bowel obstruction EXAM: PORTABLE ABDOMEN - 1 VIEW COMPARISON:  01/31/2017 FINDINGS: Scattered large and small bowel gas is noted. No definitive obstructive changes are seen. The previously noted contrast within the small bowel now lies entirely within the colon. The appendix is visualized as well. No acute abnormality is seen. Postsurgical changes are noted. IMPRESSION: Previously seen contrast now lies entirely within the colon. No obstructive changes are seen. Electronically Signed   By: Inez Catalina M.D.   On: 02/01/2017 12:52   Dg Abd Portable 1v-small Bowel Obstruction Protocol-initial, 8 Hr Delay  Result Date: 01/31/2017 CLINICAL DATA:  8 hour delay, small bowel protocol EXAM: PORTABLE ABDOMEN - 1 VIEW COMPARISON:  01/30/2017, CT 01/29/2017 FINDINGS: Tiny left pleural effusion with basilar atelectasis. Esophageal to tip appears to project over the distal mediastinum, patient has history of gastric pull through. Multiple surgical clips in the upper abdomen. Persistent dilatation of central small bowel loops,  measuring up to 4.4 cm with contrast present mainly within the dilated small bowel. No significant colon or rectal contrast. Calcified phleboliths in the pelvis. IMPRESSION: Similar appearance of dilated central small bowel loops, now containing contrast. No significant contrast in the colon or rectum. Findings would be consistent with small bowel obstruction. Electronically Signed   By: Donavan Foil M.D.   On: 01/31/2017 21:39   Dg [name] Dys Tube Plc W/fl W/rad  Result Date: 01/30/2017 CLINICAL DATA:  Small bowel obstruction. Prior gastric pull-through for esophageal cancer. EXAM: NASO G TUBE PLACEMENT WITH FL AND WITH RAD CONTRAST:  None FLUOROSCOPY TIME:  Fluoroscopy Time:  1.4 minutes Radiation Exposure Index (if provided by the fluoroscopic device): 5.7 mGy Number of Acquired Spot Images: 0 COMPARISON:  None. FINDINGS: The NG tube was placed under fluoroscopic guidance.  The tip was placed at the level of the diaphragm with the side port in the gastric pull-through in the lower chest. IMPRESSION: NG tube placed under fluoroscopic guidance as above. Electronically Signed   By: Rolm Baptise M.D.   On: 01/30/2017 15:24     Scheduled Meds: . enoxaparin (LOVENOX) injection  40 mg Subcutaneous Q24H  . mouth rinse  15 mL Mouth Rinse BID  . pantoprazole (PROTONIX) IV  40 mg Intravenous Q24H   Continuous Infusions: . dextrose 5 % and 0.9% NaCl 100 mL/hr (01/31/17 2323)     Niel Hummer, MD.  [facility] Pager [phone]  If 7PM-7AM, please contact night-coverage [email or web address] Password [facility] 02/01/2017, 2:39 PM

## 2017-02-01 NOTE — Care Management Important Message (Signed)
Important Message  Patient Details  Name: Oscar [name] MRN: [id number] Date of Birth: [birth year]   Medicare Important Message Given:  Yes    Kerin Salen 02/01/2017, 10:04 AMImportant Message  Patient Details  Name: Oscar [name] MRN: [id number] Date of Birth: 03-16-1933   Medicare Important Message Given:  Yes    Kerin Salen 02/01/2017, 10:04 AM

## 2017-02-01 NOTE — Progress Notes (Signed)
Bedside report given to [place], asked if any needs or issues need to be met or resolved at this time before this RN leaves and denied.

## 2017-02-02 ENCOUNTER — Inpatient Hospital Stay (HOSPITAL_COMMUNITY): Payer: Medicare Other

## 2017-02-02 LAB — BASIC METABOLIC PANEL
ANION GAP: 9 (ref 5–15)
BUN: 8 mg/dL (ref 6–20)
CHLORIDE: 108 mmol/L (ref 101–111)
CO2: 26 mmol/L (ref 22–32)
Calcium: 8.5 mg/dL — ABNORMAL LOW (ref 8.9–10.3)
Creatinine, Ser: 0.81 mg/dL (ref 0.61–1.24)
GFR calc Af Amer: >60 mL/min (ref >60)
GFR calc non Af Amer: >60 mL/min (ref >60)
Glucose, Bld: 144 mg/dL — ABNORMAL HIGH (ref 65–99)
POTASSIUM: 3.2 mmol/L — AB (ref 3.5–5.1)
Sodium: 143 mmol/L (ref 135–145)

## 2017-02-02 LAB — CBC
HCT: 34.8 % — ABNORMAL LOW (ref 39.0–52.0)
HEMOGLOBIN: 11.8 g/dL — AB (ref 13.0–17.0)
MCH: 31.1 pg (ref 26.0–34.0)
MCHC: 33.9 g/dL (ref 30.0–36.0)
MCV: 91.8 fL (ref 78.0–100.0)
Platelets: 222 10*3/uL (ref 150–400)
RBC: 3.79 MIL/uL — AB (ref 4.22–5.81)
RDW: 11.8 % (ref 11.5–15.5)
WBC: 8.4 10*3/uL (ref 4.0–10.5)

## 2017-02-02 MED ORDER — POTASSIUM CHLORIDE 10 MEQ/100ML IV SOLN
10.0000 meq | INTRAVENOUS | Status: AC
  Administered 2017-02-02 (×4): 10 meq via INTRAVENOUS
  Filled 2017-02-02 (×4): qty 100

## 2017-02-02 MED ORDER — DOCUSATE SODIUM 100 MG PO CAPS
100.0000 mg | ORAL_CAPSULE | Freq: Two times a day (BID) | ORAL | Status: DC
  Administered 2017-02-02 (×2): 100 mg via ORAL
  Filled 2017-02-02 (×3): qty 1

## 2017-02-02 NOTE — Progress Notes (Signed)
Patient continues to refuse CPAP. RT asked patient to call if he changes his mind.

## 2017-02-02 NOTE — Progress Notes (Signed)
Patient ID: ORMOND LAZO, male   DOB: 15-May-1932, 83 y.o.   MRN: [id number]  [facility] Surgery Progress Note     Subjective: CC- SBO Denies pain or nausea this AM. Having bowel movements and flatus. NG has been clamped all night and he has had nearly a liter of oral intake.   Objective: Vital signs in last 24 hours: Temp:  [97.5 F (36.4 C)-99.3 F (37.4 C)] 98.9 F (37.2 C) (09/27 0800) Pulse Rate:  [56-66] 57 (09/27 0800) Resp:  [16-17] 16 (09/27 0548) BP: (146-175)/(57-80) 148/60 (09/27 0800) SpO2:  [96 %-100 %] 96 % (09/27 0800) Last BM Date: 02/01/17  Intake/Output from previous day: 09/26 0701 - 09/27 0700 In: 2833.3 [P.O.:960; I.V.:1873.3] Out: 400 [Urine:200; Emesis/NG output:200] Intake/Output this shift: Total I/O In: 240 [P.O.:240] Out: 100 [Urine:100]  PE: Gen:  Alert, NAD HEENT: EOM's intact, pupils equal and round Card:  RRR, no M/G/R heard Pulm:  CTAB, no W/R/R, effort normal Abd: well healed midline incision, soft, nondistended, +BS, no tenderness Ext:  No erythema, edema, or tenderness BUE/BLE  Skin: no rashes noted, warm and dry  Lab Results:   Recent Labs  02/01/17 0450 02/02/17 0620  WBC 10.9* 8.4  HGB 12.4* 11.8*  HCT 37.4* 34.8*  PLT 225 222   BMET  Recent Labs  02/01/17 0450 02/02/17 0620  NA 142 143  K 3.5 3.2*  CL 108 108  CO2 23 26  GLUCOSE 130* 144*  BUN 8 8  CREATININE 0.87 0.81  CALCIUM 8.5* 8.5*   PT/INR No results for input(s): LABPROT, INR in the last 72 hours. CMP     Component Value Date/Time   NA 143 02/02/2017 0620   K 3.2 (L) 02/02/2017 0620   CL 108 02/02/2017 0620   CO2 26 02/02/2017 0620   GLUCOSE 144 (H) 02/02/2017 0620   BUN 8 02/02/2017 0620   CREATININE 0.81 02/02/2017 0620   CREATININE 1.13 12/07/2012 1147   CALCIUM 8.5 (L) 02/02/2017 0620   PROT 8.0 01/29/2017 0920   ALBUMIN 4.5 01/29/2017 0920   AST 19 01/29/2017 0920   ALT 12 (L) 01/29/2017 0920   ALKPHOS 73 01/29/2017 0920   BILITOT 1.1 01/29/2017 0920   GFRNONAA >60 02/02/2017 0620   GFRAA >60 02/02/2017 0620   Lipase     Component Value Date/Time   LIPASE 19 01/29/2017 0920       Studies/Results: Dg Abd Portable 1v  Result Date: 02/02/2017 CLINICAL DATA:  Follow-up small bowel obstruction. EXAM: PORTABLE ABDOMEN - 1 VIEW COMPARISON:  Abdominal radiographs of November 01, 2016 FINDINGS: Previously present contrast within the colon has cleared. There are loops of mildly distended small bowel in the left upper quadrant and left mid abdomen. The colon exhibits normal caliber. There degenerative changes of the lower lumbar spine. IMPRESSION: Findings compatible with a partial mid to distal small bowel obstruction. Previously demonstrated oral contrast has cleared. Electronically Signed   By: David  Martinique M.D.   On: 02/02/2017 07:46   Dg Abd Portable 1v-small Bowel Obstruction Protocol-24 Hr Delay  Result Date: 02/01/2017 CLINICAL DATA:  24 hour follow-up examination for small-bowel obstruction EXAM: PORTABLE ABDOMEN - 1 VIEW COMPARISON:  01/31/2017 FINDINGS: Scattered large and small bowel gas is noted. No definitive obstructive changes are seen. The previously noted contrast within the small bowel now lies entirely within the colon. The appendix is visualized as well. No acute abnormality is seen. Postsurgical changes are noted. IMPRESSION: Previously seen contrast now lies entirely  within the colon. No obstructive changes are seen. Electronically Signed   By: Inez Catalina M.D.   On: 02/01/2017 12:52   Dg Abd Portable 1v-small Bowel Obstruction Protocol-initial, 8 Hr Delay  Result Date: 01/31/2017 CLINICAL DATA:  8 hour delay, small bowel protocol EXAM: PORTABLE ABDOMEN - 1 VIEW COMPARISON:  01/30/2017, CT 01/29/2017 FINDINGS: Tiny left pleural effusion with basilar atelectasis. Esophageal to tip appears to project over the distal mediastinum, patient has history of gastric pull through. Multiple surgical clips in the  upper abdomen. Persistent dilatation of central small bowel loops, measuring up to 4.4 cm with contrast present mainly within the dilated small bowel. No significant colon or rectal contrast. Calcified phleboliths in the pelvis. IMPRESSION: Similar appearance of dilated central small bowel loops, now containing contrast. No significant contrast in the colon or rectum. Findings would be consistent with small bowel obstruction. Electronically Signed   By: Donavan Foil M.D.   On: 01/31/2017 21:39    Anti-infectives: Anti-infectives    None       Assessment/Plan HTN H/o alcohol abuse H/o esophageal cancer s/p transhiatal esophagectomy 2001 OSA  Partial SBO, likely due to adhesions - recurrent, h/o SBO 10/2016 resolved without surgery - CT scan 9/23 showed small bowel dilatation secondary to an area of transition in the mid abdomen likely related to adhesions - WBC normal - XR showed transit of contrast into colon within 24h - passing flatus, having BMs  ID - none FEN - IVF, Advance to clears VTE - SCDs, lovenox Foley - none  Plan -Remove NG and try clears today.    LOS: 4 days    Clovis Riley , [facility] 02/02/2017, 9:37 AM

## 2017-02-02 NOTE — Progress Notes (Signed)
PROGRESS NOTE  JD MCCASTER PPJ:[id number] DOB: [birth year] DOA: 01/29/2017 PCP: Deland Pretty, MD   LOS: 4 days   Brief Narrative / Interim history: 83 year old male with hypertension, remote esophageal cancer status post surgery in 2001, OSA, who was admitted to the hospital on 9/23 with abdominal pain, nausea vomiting and was diagnosed with a small bowel obstruction.  He had an episode of small bowel obstruction earlier this year which resolved with conservative management.  Assessment & Plan: Principal Problem:   Partial small bowel obstruction (HCC) Active Problems:   PERSONAL HISTORY MALIGNANT NEOPLASM ESOPHAGUS   HTN (hypertension)   Small bowel obstruction -General surgery consulted, appreciate input.  NG tube placed by IR.  -KUB with resolving SBO.  -NG tube clamp. He was started on clear diet, he has had BM.  -WBC   Essential hypertension -Continue IV hydralazine  History of alcohol use -Reports drinking small amounts every day, monitor on CIWA, does not appear to be withdrawing -thiamine and folate.   History of esophageal cancer -Post operative management in 2001, outpatient management  OSA -Continue nighttime CPAP  Hypokalemia; replete IV   DVT prophylaxis: Lovenox Code Status: Full code Family Communication: no family at bedside Disposition Plan: TBD, home when ready   Consultants:   General surgery   Procedures:   None   Antimicrobials:  None    Subjective: He had couple of BM No abdominal pain.    Objective: Vitals:   02/02/17 0548 02/02/17 0800 02/02/17 1100 02/02/17 1430  BP: (!) 153/80 (!) 148/60 133/71 (!) 157/71  Pulse: (!) 58 (!) 57 (!) 58 (!) 52  Resp: 16  15   Temp: 98.8 F (37.1 C) 98.9 F (37.2 C) 98.9 F (37.2 C) 99.4 F (37.4 C)  TempSrc: Oral Oral Oral Oral  SpO2: 100% 96% 98% 99%  Weight:      Height:        Intake/Output Summary (Last 24 hours) at 02/02/17 1724 Last data filed at 02/02/17 1400  Gross per  24 hour  Intake          2713.33 ml  Output              100 ml  Net          2613.33 ml   Filed Weights   01/31/17 1500  Weight: 92.3 kg (203 lb 8 oz)    Examination:  Constitutional: NAD Respiratory: CTA Cardiovascular: S 1, S 2 RRR Abdomen; BS present , NT, ND    Data Reviewed: I have independently reviewed following labs and imaging studies  CBC:  Recent Labs Lab 01/29/17 0920 01/30/17 0532 01/31/17 0529 02/01/17 0450 02/02/17 0620  WBC 13.4* 18.8* 11.8* 10.9* 8.4  HGB 14.7 13.6 12.7* 12.4* 11.8*  HCT 43.4 40.4 38.4* 37.4* 34.8*  MCV 93.3 95.1 97.0 95.9 91.8  PLT 289 250 234 225 580   Basic Metabolic Panel:  Recent Labs Lab 01/29/17 0920 01/30/17 0532 01/31/17 0529 02/01/17 0450 02/02/17 0620  NA 136 139 143 142 143  K 4.1 4.0 3.8 3.5 3.2*  CL 99* 104 108 108 108  CO2 27 26 29 23 26   GLUCOSE 135* 139* 139* 130* 144*  BUN 13 12 9 8 8   CREATININE 1.14 1.09 0.95 0.87 0.81  CALCIUM 9.6 8.6* 8.5* 8.5* 8.5*  MG  --   --   --  1.5*  --    GFR: Estimated Creatinine Clearance: 80.8 mL/min (by C-G formula based on SCr of 0.81  mg/dL). Liver Function Tests:  Recent Labs Lab 01/29/17 0920  AST 19  ALT 12*  ALKPHOS 73  BILITOT 1.1  PROT 8.0  ALBUMIN 4.5    Recent Labs Lab 01/29/17 0920  LIPASE 19   No results for input(s): AMMONIA in the last 168 hours. Coagulation Profile: No results for input(s): INR, PROTIME in the last 168 hours. Cardiac Enzymes: No results for input(s): CKTOTAL, CKMB, CKMBINDEX, TROPONINI in the last 168 hours. BNP (last 3 results) No results for input(s): PROBNP in the last 8760 hours. HbA1C: No results for input(s): HGBA1C in the last 72 hours. CBG: No results for input(s): GLUCAP in the last 168 hours. Lipid Profile: No results for input(s): CHOL, HDL, LDLCALC, TRIG, CHOLHDL, LDLDIRECT in the last 72 hours. Thyroid Function Tests: No results for input(s): TSH, T4TOTAL, FREET4, T3FREE, THYROIDAB in the last 72  hours. Anemia Panel: No results for input(s): VITAMINB12, FOLATE, FERRITIN, TIBC, IRON, RETICCTPCT in the last 72 hours. Urine analysis:    Component Value Date/Time   COLORURINE YELLOW 01/29/2017 0812   APPEARANCEUR CLEAR 01/29/2017 0812   LABSPEC 1.025 01/29/2017 0812   PHURINE 6.0 01/29/2017 0812   GLUCOSEU NEGATIVE 01/29/2017 0812   HGBUR SMALL (A) 01/29/2017 0812   BILIRUBINUR NEGATIVE 01/29/2017 0812   KETONESUR NEGATIVE 01/29/2017 0812   PROTEINUR TRACE (A) 01/29/2017 0812   UROBILINOGEN 0.2 09/12/2014 1816   NITRITE NEGATIVE 01/29/2017 0812   LEUKOCYTESUR NEGATIVE 01/29/2017 0812   Sepsis Labs: Invalid input(s): PROCALCITONIN, LACTICIDVEN  No results found for this or any previous visit (from the past 240 hour(s)).    Radiology Studies: Dg Abd Portable 1v  Result Date: 02/02/2017 CLINICAL DATA:  Follow-up small bowel obstruction. EXAM: PORTABLE ABDOMEN - 1 VIEW COMPARISON:  Abdominal radiographs of November 01, 2016 FINDINGS: Previously present contrast within the colon has cleared. There are loops of mildly distended small bowel in the left upper quadrant and left mid abdomen. The colon exhibits normal caliber. There degenerative changes of the lower lumbar spine. IMPRESSION: Findings compatible with a partial mid to distal small bowel obstruction. Previously demonstrated oral contrast has cleared. Electronically Signed   By: David  Martinique M.D.   On: 02/02/2017 07:46   Dg Abd Portable 1v-small Bowel Obstruction Protocol-24 Hr Delay  Result Date: 02/01/2017 CLINICAL DATA:  24 hour follow-up examination for small-bowel obstruction EXAM: PORTABLE ABDOMEN - 1 VIEW COMPARISON:  01/31/2017 FINDINGS: Scattered large and small bowel gas is noted. No definitive obstructive changes are seen. The previously noted contrast within the small bowel now lies entirely within the colon. The appendix is visualized as well. No acute abnormality is seen. Postsurgical changes are noted. IMPRESSION:  Previously seen contrast now lies entirely within the colon. No obstructive changes are seen. Electronically Signed   By: Inez Catalina M.D.   On: 02/01/2017 12:52   Dg Abd Portable 1v-small Bowel Obstruction Protocol-initial, 8 Hr Delay  Result Date: 01/31/2017 CLINICAL DATA:  8 hour delay, small bowel protocol EXAM: PORTABLE ABDOMEN - 1 VIEW COMPARISON:  01/30/2017, CT 01/29/2017 FINDINGS: Tiny left pleural effusion with basilar atelectasis. Esophageal to tip appears to project over the distal mediastinum, patient has history of gastric pull through. Multiple surgical clips in the upper abdomen. Persistent dilatation of central small bowel loops, measuring up to 4.4 cm with contrast present mainly within the dilated small bowel. No significant colon or rectal contrast. Calcified phleboliths in the pelvis. IMPRESSION: Similar appearance of dilated central small bowel loops, now containing contrast. No significant contrast  in the colon or rectum. Findings would be consistent with small bowel obstruction. Electronically Signed   By: Donavan Foil M.D.   On: 01/31/2017 21:39     Scheduled Meds: . docusate sodium  100 mg Oral BID  . enoxaparin (LOVENOX) injection  40 mg Subcutaneous Q24H  . mouth rinse  15 mL Mouth Rinse BID  . pantoprazole (PROTONIX) IV  40 mg Intravenous Q24H  . thiamine injection  100 mg Intravenous Daily   Continuous Infusions: . dextrose 5 % and 0.9% NaCl 100 mL/hr at 02/02/17 1638     Niel Hummer, MD.  [facility] Pager [phone]  If 7PM-7AM, please contact night-coverage [email or web address] Password [facility] 02/02/2017, 5:24 PM

## 2017-02-02 NOTE — Progress Notes (Signed)
PT Cancellation Note  Patient Details Name: Oscar [name] MRN: [id number] DOB: 06-Sep-1932   Cancelled Treatment:    Reason Eval/Treat Not Completed: PT screened, no needs identified, will sign off (Pt reports he's already walked full lap around the unit without difficulty. He walks with a cane at baseline. Encouraged pt to ambulate in hall TID. No PT needed at present, will sign off. )   [name] 02/02/2017, 1:22 PM  [phone]

## 2017-02-03 LAB — CBC
HCT: 32.9 % — ABNORMAL LOW (ref 39.0–52.0)
Hemoglobin: 11.5 g/dL — ABNORMAL LOW (ref 13.0–17.0)
MCH: 31.7 pg (ref 26.0–34.0)
MCHC: 35 g/dL (ref 30.0–36.0)
MCV: 90.6 fL (ref 78.0–100.0)
PLATELETS: 212 10*3/uL (ref 150–400)
RBC: 3.63 MIL/uL — AB (ref 4.22–5.81)
RDW: 11.9 % (ref 11.5–15.5)
WBC: 7.9 10*3/uL (ref 4.0–10.5)

## 2017-02-03 LAB — BASIC METABOLIC PANEL
Anion gap: 6 (ref 5–15)
BUN: 5 mg/dL — AB (ref 6–20)
CALCIUM: 8.4 mg/dL — AB (ref 8.9–10.3)
CO2: 24 mmol/L (ref 22–32)
CREATININE: 0.82 mg/dL (ref 0.61–1.24)
Chloride: 108 mmol/L (ref 101–111)
GFR calc Af Amer: >60 mL/min (ref >60)
Glucose, Bld: 124 mg/dL — ABNORMAL HIGH (ref 65–99)
Potassium: 3.3 mmol/L — ABNORMAL LOW (ref 3.5–5.1)
Sodium: 138 mmol/L (ref 135–145)

## 2017-02-03 LAB — MAGNESIUM: MAGNESIUM: 1.4 mg/dL — AB (ref 1.7–2.4)

## 2017-02-03 MED ORDER — MAGNESIUM SULFATE 2 GM/50ML IV SOLN
2.0000 g | Freq: Once | INTRAVENOUS | Status: DC
  Filled 2017-02-03: qty 50

## 2017-02-03 MED ORDER — MAGNESIUM OXIDE 400 (241.3 MG) MG PO TABS: 200.0000 mg | ORAL_TABLET | Freq: Two times a day (BID) | ORAL | 0 refills | Status: DC

## 2017-02-03 MED ORDER — DOCUSATE SODIUM 100 MG PO CAPS: 100.0000 mg | ORAL_CAPSULE | Freq: Two times a day (BID) | ORAL | 0 refills | Status: DC

## 2017-02-03 MED ORDER — POTASSIUM CHLORIDE CRYS ER 20 MEQ PO TBCR
20.0000 meq | EXTENDED_RELEASE_TABLET | Freq: Two times a day (BID) | ORAL | Status: DC
  Administered 2017-02-03: 20 meq via ORAL
  Filled 2017-02-03: qty 1

## 2017-02-03 MED ORDER — MAGNESIUM OXIDE 400 (241.3 MG) MG PO TABS
200.0000 mg | ORAL_TABLET | Freq: Two times a day (BID) | ORAL | Status: DC
  Administered 2017-02-03: 200 mg via ORAL
  Filled 2017-02-03: qty 1

## 2017-02-03 MED ORDER — POTASSIUM CHLORIDE CRYS ER 20 MEQ PO TBCR: 20.0000 meq | EXTENDED_RELEASE_TABLET | Freq: Two times a day (BID) | ORAL | 0 refills | Status: DC

## 2017-02-03 NOTE — Progress Notes (Signed)
Patient ID: Oscar [name], male   DOB: Oct 11, 1932, 83 y.o.   MRN: [id number]  [facility] Surgery Progress Note     Subjective: CC- SBO Patient states that he feels very good today and is ready to go home. He is tolerating clear liquids. Denies n/v, abdominal pain or bloating. Passing gas and has had 2-3 BMs this morning. Ambulating without difficulty.  Objective: Vital signs in last 24 hours: Temp:  [98.3 F (36.8 C)-99.4 F (37.4 C)] 98.3 F (36.8 C) (09/28 0651) Pulse Rate:  [46-58] 55 (09/28 0651) Resp:  [15] 15 (09/27 1100) BP: (133-159)/(60-71) 152/60 (09/28 0651) SpO2:  [96 %-100 %] 96 % (09/28 0651) Last BM Date: 02/03/17  Intake/Output from previous day: 09/27 0701 - 09/28 0700 In: 4360 [P.O.:1660; I.V.:2700] Out: 350 [Urine:350] Intake/Output this shift: Total I/O In: 360 [P.O.:360] Out: 100 [Urine:100]  PE: Gen: Alert, NAD HEENT: EOM's intact, pupils equal and round Card: RRR, no M/G/R heard Pulm: CTAB, no W/R/R, effort normal Abd: well healed midline incision, soft, nondistended, +BS, nontender Ext: No erythema, edema, or tenderness BUE/BLE  Skin: no rashes noted, warm and dry   Lab Results:   Recent Labs  02/02/17 0620 02/03/17 0521  WBC 8.4 7.9  HGB 11.8* 11.5*  HCT 34.8* 32.9*  PLT 222 212   BMET  Recent Labs  02/02/17 0620 02/03/17 0521  NA 143 138  K 3.2* 3.3*  CL 108 108  CO2 26 24  GLUCOSE 144* 124*  BUN 8 5*  CREATININE 0.81 0.82  CALCIUM 8.5* 8.4*   PT/INR No results for input(s): LABPROT, INR in the last 72 hours. CMP     Component Value Date/Time   NA 138 02/03/2017 0521   K 3.3 (L) 02/03/2017 0521   CL 108 02/03/2017 0521   CO2 24 02/03/2017 0521   GLUCOSE 124 (H) 02/03/2017 0521   BUN 5 (L) 02/03/2017 0521   CREATININE 0.82 02/03/2017 0521   CREATININE 1.13 12/07/2012 1147   CALCIUM 8.4 (L) 02/03/2017 0521   PROT 8.0 01/29/2017 0920   ALBUMIN 4.5 01/29/2017 0920   AST 19 01/29/2017 0920   ALT 12 (L)  01/29/2017 0920   ALKPHOS 73 01/29/2017 0920   BILITOT 1.1 01/29/2017 0920   GFRNONAA >60 02/03/2017 0521   GFRAA >60 02/03/2017 0521   Lipase     Component Value Date/Time   LIPASE 19 01/29/2017 0920       Studies/Results: Dg Abd Portable 1v  Result Date: 02/02/2017 CLINICAL DATA:  Follow-up small bowel obstruction. EXAM: PORTABLE ABDOMEN - 1 VIEW COMPARISON:  Abdominal radiographs of November 01, 2016 FINDINGS: Previously present contrast within the colon has cleared. There are loops of mildly distended small bowel in the left upper quadrant and left mid abdomen. The colon exhibits normal caliber. There degenerative changes of the lower lumbar spine. IMPRESSION: Findings compatible with a partial mid to distal small bowel obstruction. Previously demonstrated oral contrast has cleared. Electronically Signed   By: David  Martinique M.D.   On: 02/02/2017 07:46   Dg Abd Portable 1v-small Bowel Obstruction Protocol-24 Hr Delay  Result Date: 02/01/2017 CLINICAL DATA:  24 hour follow-up examination for small-bowel obstruction EXAM: PORTABLE ABDOMEN - 1 VIEW COMPARISON:  01/31/2017 FINDINGS: Scattered large and small bowel gas is noted. No definitive obstructive changes are seen. The previously noted contrast within the small bowel now lies entirely within the colon. The appendix is visualized as well. No acute abnormality is seen. Postsurgical changes are noted. IMPRESSION: Previously  seen contrast now lies entirely within the colon. No obstructive changes are seen. Electronically Signed   By: Inez Catalina M.D.   On: 02/01/2017 12:52    Anti-infectives: Anti-infectives    None       Assessment/Plan HTN H/o alcohol abuse H/o esophageal cancer s/p transhiatal esophagectomy 2001 OSA  Partial SBO, likely due to adhesions - recurrent, h/o SBO 10/2016 resolved without surgery - CT scan 9/23 showed small bowel dilatation secondary to an area of transition in the mid abdomen likely related to  adhesions - WBC WNL - passing flatus and having bowel function  ID - none FEN - decrease IVF, fulls to soft diet VTE - SCDs, lovenox Foley - none  Plan - Tolerating clear liquids and having bowel function. Full liquids for breakfast and soft diet for lunch. Replace PO potassium. If patient tolerates this he would be ready for discharge this afternoon from our standpoint.    LOS: 5 days    Wellington Hampshire , [facility] 02/03/2017, 10:34 AM Pager: [phone] Consults: [phone] Mon-Fri 7:00 am-4:30 pm Sat-Sun 7:00 am-11:30 am

## 2017-02-03 NOTE — Progress Notes (Signed)
Patient is being discharged home. Discharge instructions were given to patient and family. Patient was able to ea twait for an hour  and ambulate 360 ft before discharge

## 2017-02-03 NOTE — Discharge Summary (Signed)
Physician Discharge Summary  Oscar [name] STM:[id number] DOB: Apr 07, 1933 DOA: 01/29/2017  PCP: Deland Pretty, MD  Admit date: 01/29/2017 Discharge date: 02/03/2017  Admitted From: home  Disposition:  Home   Recommendations for Outpatient Follow-up:  1. Follow up with PCP in 1-2 weeks 2. Please obtain BMP/CBC in one week, mg level     Home Health: no   Discharge Condition: Stable.  CODE STATUS: full code.  Diet recommendation: Heart Healthy   Brief/Interim Summary: 83 year old male with hypertension, remote esophageal cancer status post surgery in 2001, OSA, who was admitted to the hospital on 9/23 with abdominal pain, nausea vomiting and was diagnosed with a small bowel obstruction.  He had an episode of small bowel obstruction earlier this year which resolved with conservative management.  Assessment & Plan: Principal Problem:   Partial small bowel obstruction (HCC) Active Problems:   PERSONAL HISTORY MALIGNANT NEOPLASM ESOPHAGUS   HTN (hypertension)   Small bowel obstruction -General surgery consulted, appreciate input.  NG tube placed by IR.  -KUB with resolving SBO.  -NG tube clamp.  -WBC normal.  - he has had multiples BM overnight. Denies abdominal pain. Tolerating diet.  Plan to discharge today/   Essential hypertension -resume home medications at discharge   History of alcohol use -Reports drinking small amounts every day, monitor on CIWA, does not appear to be withdrawing -thiamine and folate.   History of esophageal cancer -Post operative management in 2001, outpatient management  OSA -Continue nighttime CPAP  Hypokalemia; replete orally. I have provide prescription for potasium.  hypomagnesemia; replete IV and rally.   Discharge Diagnoses:  Principal Problem:   Partial small bowel obstruction (HCC) Active Problems:   PERSONAL HISTORY MALIGNANT NEOPLASM ESOPHAGUS   HTN (hypertension)    Discharge Instructions  Discharge Instructions     Diet - low sodium heart healthy    Complete by:  As directed    Increase activity slowly    Complete by:  As directed      Allergies as of 02/03/2017      Reactions   Bee Venom       Medication List    STOP taking these medications   methylPREDNISolone 4 MG tablet Commonly known as:  MEDROL     TAKE these medications   atenolol 25 MG tablet Commonly known as:  TENORMIN Take 1 tablet (25 mg total) by mouth daily. What changed:  when to take this   docusate sodium 100 MG capsule Commonly known as:  COLACE Take 1 capsule (100 mg total) by mouth 2 (two) times daily.   HYDROcodone-acetaminophen 5-325 MG tablet Commonly known as:  NORCO/VICODIN Take 1 tablet by mouth every 6 (six) hours as needed for moderate pain.   losartan 100 MG tablet Commonly known as:  COZAAR Take 0.5 tablets (50 mg total) by mouth daily.   magnesium oxide 400 (241.3 Mg) MG tablet Commonly known as:  MAG-OX Take 0.5 tablets (200 mg total) by mouth 2 (two) times daily. What changed:  medication strength  how much to take  when to take this   omeprazole 20 MG capsule Commonly known as:  PRILOSEC Take 40 mg by mouth 2 (two) times daily before a meal.   potassium chloride SA 20 MEQ tablet Commonly known as:  K-DUR,KLOR-CON Take 1 tablet (20 mEq total) by mouth 2 (two) times daily.   SAW PALMETTO PO Take 2 capsules by mouth daily.   senna-docusate 8.6-50 MG tablet Commonly known as:  Senokot-S Take 1  tablet by mouth at bedtime.   traMADol 50 MG tablet Commonly known as:  ULTRAM Take 50-100 mg by mouth 3 (three) times daily as needed for moderate pain or severe pain.   vitamin B-12 1000 MCG tablet Commonly known as:  CYANOCOBALAMIN Take 1,000 mcg by mouth daily.            Discharge Care Instructions        Start     Ordered   02/03/17 0000  docusate sodium (COLACE) 100 MG capsule  2 times daily     02/03/17 1359   02/03/17 0000  potassium chloride SA (K-DUR,KLOR-CON) 20  MEQ tablet  2 times daily     02/03/17 1359   02/03/17 0000  Increase activity slowly     02/03/17 1359   02/03/17 0000  Diet - low sodium heart healthy     02/03/17 1359   02/03/17 0000  magnesium oxide (MAG-OX) 400 (241.3 Mg) MG tablet  2 times daily     02/03/17 1527      Allergies  Allergen Reactions  . Bee Venom     Consultations:  Surgery    Procedures/Studies: Ct Abdomen Pelvis W Contrast  Result Date: 01/29/2017 CLINICAL DATA:  Generalized abdominal pain for several hours, initial encounter EXAM: CT ABDOMEN AND PELVIS WITH CONTRAST TECHNIQUE: Multidetector CT imaging of the abdomen and pelvis was performed using the standard protocol following bolus administration of intravenous contrast. CONTRAST:  100 mL ISOVUE-300 IOPAMIDOL (ISOVUE-300) INJECTION 61% COMPARISON:  10/31/2016 FINDINGS: Lower chest: Mild scarring is noted in the bases bilaterally. No focal confluent infiltrate is seen. Hepatobiliary: The liver is within normal limits. The gallbladder is well distended with multiple small dependent stones. Pancreas: Unremarkable. No pancreatic ductal dilatation or surrounding inflammatory changes. Spleen: Normal in size without focal abnormality. Adrenals/Urinary Tract: Renal cystic changes are noted in the left kidney. No obstructive changes are seen. The bladder is partially distended. Stomach/Bowel: There are changes consistent with prior esophageal surgery with gastric pull-through. These are incompletely evaluated on this exam. Scattered diverticular change of the colon is noted. The appendix is within normal limits. Considerable small bowel dilatation is identified proximally with a transition zone in the mid abdomen just anterior to the aorta best seen on image number 41 of series 2 and image number or 46 of series 4. No definitive mass lesion is noted in this is likely related to adhesions. The more distal small bowel is within normal limits. Vascular/Lymphatic: Aortic  atherosclerosis. No enlarged abdominal or pelvic lymph nodes. Reproductive: Prostate is enlarged. Other: Minimal free fluid is noted within the pelvis likely reactive in nature. No evidence of perforation is seen. No hernia is identified. Musculoskeletal: Degenerative changes of the lumbar spine are seen. IMPRESSION: Small bowel dilatation secondary to an area of transition in the mid abdomen likely related to adhesions. Cholelithiasis without complicating factors. Chronic changes as described above. Electronically Signed   By: Inez Catalina M.D.   On: 01/29/2017 14:26   Dg Abd 2 Views  Result Date: 01/30/2017 CLINICAL DATA:  Nausea and vomiting. History of small bowel obstruction. EXAM: ABDOMEN - 2 VIEW COMPARISON:  CT of 1 day prior. FINDINGS: Upright and supine films of the abdomen and pelvis. Upright views demonstrate small bowel air-fluid levels. Probable scarring in both lung bases. No gross free intraperitoneal air. Supine images demonstrate gas within the rectum. Proximal small bowel dilatation including at 5.1 cm. Similar to on the prior CT. No pneumatosis. IMPRESSION: Similar appearance of  partial small bowel obstruction. No free intraperitoneal air or other acute complication identified. Electronically Signed   By: Abigail Miyamoto M.D.   On: 01/30/2017 14:24   Dg Abd Portable 1v  Result Date: 02/02/2017 CLINICAL DATA:  Follow-up small bowel obstruction. EXAM: PORTABLE ABDOMEN - 1 VIEW COMPARISON:  Abdominal radiographs of November 01, 2016 FINDINGS: Previously present contrast within the colon has cleared. There are loops of mildly distended small bowel in the left upper quadrant and left mid abdomen. The colon exhibits normal caliber. There degenerative changes of the lower lumbar spine. IMPRESSION: Findings compatible with a partial mid to distal small bowel obstruction. Previously demonstrated oral contrast has cleared. Electronically Signed   By: David  Martinique M.D.   On: 02/02/2017 07:46   Dg Abd  Portable 1v-small Bowel Obstruction Protocol-24 Hr Delay  Result Date: 02/01/2017 CLINICAL DATA:  24 hour follow-up examination for small-bowel obstruction EXAM: PORTABLE ABDOMEN - 1 VIEW COMPARISON:  01/31/2017 FINDINGS: Scattered large and small bowel gas is noted. No definitive obstructive changes are seen. The previously noted contrast within the small bowel now lies entirely within the colon. The appendix is visualized as well. No acute abnormality is seen. Postsurgical changes are noted. IMPRESSION: Previously seen contrast now lies entirely within the colon. No obstructive changes are seen. Electronically Signed   By: Inez Catalina M.D.   On: 02/01/2017 12:52   Dg Abd Portable 1v-small Bowel Obstruction Protocol-initial, 8 Hr Delay  Result Date: 01/31/2017 CLINICAL DATA:  8 hour delay, small bowel protocol EXAM: PORTABLE ABDOMEN - 1 VIEW COMPARISON:  01/30/2017, CT 01/29/2017 FINDINGS: Tiny left pleural effusion with basilar atelectasis. Esophageal to tip appears to project over the distal mediastinum, patient has history of gastric pull through. Multiple surgical clips in the upper abdomen. Persistent dilatation of central small bowel loops, measuring up to 4.4 cm with contrast present mainly within the dilated small bowel. No significant colon or rectal contrast. Calcified phleboliths in the pelvis. IMPRESSION: Similar appearance of dilated central small bowel loops, now containing contrast. No significant contrast in the colon or rectum. Findings would be consistent with small bowel obstruction. Electronically Signed   By: Donavan Foil M.D.   On: 01/31/2017 21:39   Dg [name] Dys Tube Plc W/fl W/rad  Result Date: 01/30/2017 CLINICAL DATA:  Small bowel obstruction. Prior gastric pull-through for esophageal cancer. EXAM: NASO G TUBE PLACEMENT WITH FL AND WITH RAD CONTRAST:  None FLUOROSCOPY TIME:  Fluoroscopy Time:  1.4 minutes Radiation Exposure Index (if provided by the fluoroscopic device): 5.7 mGy  Number of Acquired Spot Images: 0 COMPARISON:  None. FINDINGS: The NG tube was placed under fluoroscopic guidance. The tip was placed at the level of the diaphragm with the side port in the gastric pull-through in the lower chest. IMPRESSION: NG tube placed under fluoroscopic guidance as above. Electronically Signed   By: Rolm Baptise M.D.   On: 01/30/2017 15:24   Xr Lumbar Spine 2-3 Views  Result Date: 01/11/2017 AP lateral lumbar spine reviewed.  Hip joints without arthritis.  Surgical clips noted left upper quadrant of the abdomen.  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  Calcification of the aorta is noted.  No spondylolisthesis or compression fractures present.     Subjective: Feeling well, had multiples BM overnight. Tolerating diet   Discharge Exam: Vitals:   02/03/17 0651 02/03/17 1509  BP: (!) 152/60 (!) 168/71  Pulse: (!) 55 (!) 57  Resp:  16  Temp: 98.3 F (  36.8 C) 98.9 F (37.2 C)  SpO2: 96% 99%   Vitals:   02/02/17 1430 02/02/17 2226 02/03/17 0651 02/03/17 1509  BP: (!) 157/71 (!) 159/63 (!) 152/60 (!) 168/71  Pulse: (!) 52 (!) 46 (!) 55 (!) 57  Resp:    16  Temp: 99.4 F (37.4 C) 99.1 F (37.3 C) 98.3 F (36.8 C) 98.9 F (37.2 C)  TempSrc: Oral Oral Oral Oral  SpO2: 99% 100% 96% 99%  Weight:      Height:        General: Pt is alert, awake, not in acute distress Cardiovascular: RRR, S1/S2 +, no rubs, no gallops Respiratory: CTA bilaterally, no wheezing, no rhonchi Abdominal: Soft, NT, ND, bowel sounds + Extremities: no edema, no cyanosis    The results of significant diagnostics from this hospitalization (including imaging, microbiology, ancillary and laboratory) are listed below for reference.     Microbiology: No results found for this or any previous visit (from the past 240 hour(s)).   Labs: BNP (last 3 results) No results for input(s): BNP in the last 8760 hours. Basic Metabolic Panel:  Recent Labs Lab  01/30/17 0532 01/31/17 0529 02/01/17 0450 02/02/17 0620 02/03/17 0521  NA 139 143 142 143 138  K 4.0 3.8 3.5 3.2* 3.3*  CL 104 108 108 108 108  CO2 26 29 23 26 24   GLUCOSE 139* 139* 130* 144* 124*  BUN 12 9 8 8  5*  CREATININE 1.09 0.95 0.87 0.81 0.82  CALCIUM 8.6* 8.5* 8.5* 8.5* 8.4*  MG  --   --  1.5*  --  1.4*   Liver Function Tests:  Recent Labs Lab 01/29/17 0920  AST 19  ALT 12*  ALKPHOS 73  BILITOT 1.1  PROT 8.0  ALBUMIN 4.5    Recent Labs Lab 01/29/17 0920  LIPASE 19   No results for input(s): AMMONIA in the last 168 hours. CBC:  Recent Labs Lab 01/30/17 0532 01/31/17 0529 02/01/17 0450 02/02/17 0620 02/03/17 0521  WBC 18.8* 11.8* 10.9* 8.4 7.9  HGB 13.6 12.7* 12.4* 11.8* 11.5*  HCT 40.4 38.4* 37.4* 34.8* 32.9*  MCV 95.1 97.0 95.9 91.8 90.6  PLT 250 234 225 222 212   Cardiac Enzymes: No results for input(s): CKTOTAL, CKMB, CKMBINDEX, TROPONINI in the last 168 hours. BNP: Invalid input(s): POCBNP CBG: No results for input(s): GLUCAP in the last 168 hours. D-Dimer No results for input(s): DDIMER in the last 72 hours. Hgb A1c No results for input(s): HGBA1C in the last 72 hours. Lipid Profile No results for input(s): CHOL, HDL, LDLCALC, TRIG, CHOLHDL, LDLDIRECT in the last 72 hours. Thyroid function studies No results for input(s): TSH, T4TOTAL, T3FREE, THYROIDAB in the last 72 hours.  Invalid input(s): FREET3 Anemia work up No results for input(s): VITAMINB12, FOLATE, FERRITIN, TIBC, IRON, RETICCTPCT in the last 72 hours. Urinalysis    Component Value Date/Time   COLORURINE YELLOW 01/29/2017 0812   APPEARANCEUR CLEAR 01/29/2017 0812   LABSPEC 1.025 01/29/2017 0812   PHURINE 6.0 01/29/2017 0812   GLUCOSEU NEGATIVE 01/29/2017 0812   HGBUR SMALL (A) 01/29/2017 0812   BILIRUBINUR NEGATIVE 01/29/2017 0812   KETONESUR NEGATIVE 01/29/2017 0812   PROTEINUR TRACE (A) 01/29/2017 0812   UROBILINOGEN 0.2 09/12/2014 1816   NITRITE NEGATIVE  01/29/2017 0812   LEUKOCYTESUR NEGATIVE 01/29/2017 0812   Sepsis Labs Invalid input(s): PROCALCITONIN,  WBC,  LACTICIDVEN Microbiology No results found for this or any previous visit (from the past 240 hour(s)).   Time coordinating discharge:  Over 30 minutes  SIGNED:   Elmarie Shiley, MD  [facility] 02/03/2017, 4:11 PM Pager   If 7PM-7AM, please contact night-coverage [email or web address] Password [email or web address]

## 2017-02-06 ENCOUNTER — Ambulatory Visit
Admission: RE | Admit: 2017-02-06 | Discharge: 2017-02-06 | Disposition: A | Discharge status: Home / Self Care | Payer: Medicare Other | Source: Ambulatory Visit | Attending: Orthopedic Surgery | Admitting: Orthopedic Surgery

## 2017-02-06 DIAGNOSIS — M4807 Spinal stenosis, lumbosacral region: Secondary | ICD-10-CM

## 2017-02-06 DIAGNOSIS — M48061 Spinal stenosis, lumbar region without neurogenic claudication: Secondary | ICD-10-CM | POA: Diagnosis not present

## 2017-02-11 ENCOUNTER — Inpatient Hospital Stay (HOSPITAL_COMMUNITY)
Admission: EM | Admit: 2017-02-11 | Discharge: 2017-02-15 | DRG: 389 | Disposition: A | Discharge status: Home / Self Care | Payer: Medicare Other | Attending: Family Medicine | Admitting: Family Medicine

## 2017-02-11 ENCOUNTER — Emergency Department (HOSPITAL_COMMUNITY): Payer: Medicare Other

## 2017-02-11 ENCOUNTER — Encounter (HOSPITAL_COMMUNITY): Payer: Self-pay | Admitting: Emergency Medicine

## 2017-02-11 DIAGNOSIS — N400 Enlarged prostate without lower urinary tract symptoms: Secondary | ICD-10-CM | POA: Diagnosis present

## 2017-02-11 DIAGNOSIS — Z91030 Bee allergy status: Secondary | ICD-10-CM

## 2017-02-11 DIAGNOSIS — Z8501 Personal history of malignant neoplasm of esophagus: Secondary | ICD-10-CM

## 2017-02-11 DIAGNOSIS — M1990 Unspecified osteoarthritis, unspecified site: Secondary | ICD-10-CM | POA: Diagnosis present

## 2017-02-11 DIAGNOSIS — K219 Gastro-esophageal reflux disease without esophagitis: Secondary | ICD-10-CM | POA: Diagnosis present

## 2017-02-11 DIAGNOSIS — E785 Hyperlipidemia, unspecified: Secondary | ICD-10-CM | POA: Diagnosis present

## 2017-02-11 DIAGNOSIS — E860 Dehydration: Secondary | ICD-10-CM | POA: Diagnosis present

## 2017-02-11 DIAGNOSIS — Z8249 Family history of ischemic heart disease and other diseases of the circulatory system: Secondary | ICD-10-CM | POA: Diagnosis not present

## 2017-02-11 DIAGNOSIS — E440 Moderate protein-calorie malnutrition: Secondary | ICD-10-CM | POA: Diagnosis present

## 2017-02-11 DIAGNOSIS — G4733 Obstructive sleep apnea (adult) (pediatric): Secondary | ICD-10-CM | POA: Diagnosis present

## 2017-02-11 DIAGNOSIS — I10 Essential (primary) hypertension: Secondary | ICD-10-CM | POA: Diagnosis not present

## 2017-02-11 DIAGNOSIS — E876 Hypokalemia: Secondary | ICD-10-CM | POA: Diagnosis not present

## 2017-02-11 DIAGNOSIS — D72825 Bandemia: Secondary | ICD-10-CM | POA: Diagnosis not present

## 2017-02-11 DIAGNOSIS — Z86010 Personal history of colonic polyps: Secondary | ICD-10-CM | POA: Diagnosis not present

## 2017-02-11 DIAGNOSIS — R110 Nausea: Secondary | ICD-10-CM

## 2017-02-11 DIAGNOSIS — E119 Type 2 diabetes mellitus without complications: Secondary | ICD-10-CM | POA: Diagnosis present

## 2017-02-11 DIAGNOSIS — Z833 Family history of diabetes mellitus: Secondary | ICD-10-CM

## 2017-02-11 DIAGNOSIS — Z6825 Body mass index (BMI) 25.0-25.9, adult: Secondary | ICD-10-CM

## 2017-02-11 DIAGNOSIS — K56600 Partial intestinal obstruction, unspecified as to cause: Principal | ICD-10-CM | POA: Diagnosis present

## 2017-02-11 DIAGNOSIS — D72829 Elevated white blood cell count, unspecified: Secondary | ICD-10-CM | POA: Diagnosis present

## 2017-02-11 DIAGNOSIS — R1084 Generalized abdominal pain: Secondary | ICD-10-CM | POA: Diagnosis not present

## 2017-02-11 DIAGNOSIS — I351 Nonrheumatic aortic (valve) insufficiency: Secondary | ICD-10-CM | POA: Diagnosis not present

## 2017-02-11 DIAGNOSIS — Z823 Family history of stroke: Secondary | ICD-10-CM

## 2017-02-11 DIAGNOSIS — K56609 Unspecified intestinal obstruction, unspecified as to partial versus complete obstruction: Secondary | ICD-10-CM

## 2017-02-11 DIAGNOSIS — K2270 Barrett's esophagus without dysplasia: Secondary | ICD-10-CM | POA: Diagnosis present

## 2017-02-11 DIAGNOSIS — R0602 Shortness of breath: Secondary | ICD-10-CM | POA: Diagnosis not present

## 2017-02-11 DIAGNOSIS — I440 Atrioventricular block, first degree: Secondary | ICD-10-CM | POA: Diagnosis not present

## 2017-02-11 DIAGNOSIS — R140 Abdominal distension (gaseous): Secondary | ICD-10-CM

## 2017-02-11 DIAGNOSIS — R109 Unspecified abdominal pain: Secondary | ICD-10-CM | POA: Diagnosis not present

## 2017-02-11 DIAGNOSIS — R112 Nausea with vomiting, unspecified: Secondary | ICD-10-CM | POA: Diagnosis not present

## 2017-02-11 DIAGNOSIS — Z79899 Other long term (current) drug therapy: Secondary | ICD-10-CM

## 2017-02-11 DIAGNOSIS — F17290 Nicotine dependence, other tobacco product, uncomplicated: Secondary | ICD-10-CM | POA: Diagnosis present

## 2017-02-11 LAB — COMPREHENSIVE METABOLIC PANEL
ALBUMIN: 4.7 g/dL (ref 3.5–5.0)
ALK PHOS: 69 U/L (ref 38–126)
ALT: 16 U/L — AB (ref 17–63)
AST: 24 U/L (ref 15–41)
Anion gap: 11 (ref 5–15)
BUN: 14 mg/dL (ref 6–20)
CALCIUM: 9.9 mg/dL (ref 8.9–10.3)
CO2: 30 mmol/L (ref 22–32)
CREATININE: 1.17 mg/dL (ref 0.61–1.24)
Chloride: 96 mmol/L — ABNORMAL LOW (ref 101–111)
GFR calc Af Amer: >60 mL/min (ref >60)
GFR calc non Af Amer: 57 mL/min — ABNORMAL LOW (ref >60)
GLUCOSE: 116 mg/dL — AB (ref 65–99)
Potassium: 4.5 mmol/L (ref 3.5–5.1)
SODIUM: 137 mmol/L (ref 135–145)
Total Bilirubin: 1.1 mg/dL (ref 0.3–1.2)
Total Protein: 8.4 g/dL — ABNORMAL HIGH (ref 6.5–8.1)

## 2017-02-11 LAB — URINALYSIS, ROUTINE W REFLEX MICROSCOPIC
Bacteria, UA: NONE SEEN
Bilirubin Urine: NEGATIVE
Glucose, UA: NEGATIVE mg/dL
KETONES UR: 5 mg/dL — AB
Leukocytes, UA: NEGATIVE
Nitrite: NEGATIVE
PH: 5 (ref 5.0–8.0)
Protein, ur: 30 mg/dL — AB
SPECIFIC GRAVITY, URINE: 1.02 (ref 1.005–1.030)

## 2017-02-11 LAB — CBC
HCT: 42.7 % (ref 39.0–52.0)
Hemoglobin: 14.4 g/dL (ref 13.0–17.0)
MCH: 32.1 pg (ref 26.0–34.0)
MCHC: 33.7 g/dL (ref 30.0–36.0)
MCV: 95.3 fL (ref 78.0–100.0)
PLATELETS: 477 10*3/uL — AB (ref 150–400)
RBC: 4.48 MIL/uL (ref 4.22–5.81)
RDW: 12.3 % (ref 11.5–15.5)
WBC: 18.2 10*3/uL — ABNORMAL HIGH (ref 4.0–10.5)

## 2017-02-11 LAB — I-STAT CG4 LACTIC ACID, ED: Lactic Acid, Venous: 1.04 mmol/L (ref 0.5–1.9)

## 2017-02-11 LAB — PROTIME-INR
INR: 0.98
Prothrombin Time: 12.9 seconds (ref 11.4–15.2)

## 2017-02-11 LAB — LIPASE, BLOOD: Lipase: 20 U/L (ref 11–51)

## 2017-02-11 MED ORDER — MORPHINE SULFATE (PF) 4 MG/ML IV SOLN
4.0000 mg | Freq: Once | INTRAVENOUS | Status: AC
  Administered 2017-02-11: 4 mg via INTRAVENOUS
  Filled 2017-02-11: qty 1

## 2017-02-11 MED ORDER — IOPAMIDOL (ISOVUE-300) INJECTION 61%
100.0000 mL | Freq: Once | INTRAVENOUS | Status: AC | PRN
  Administered 2017-02-11: 100 mL via INTRAVENOUS

## 2017-02-11 MED ORDER — IOPAMIDOL (ISOVUE-300) INJECTION 61%
INTRAVENOUS | Status: AC
  Filled 2017-02-11: qty 100

## 2017-02-11 NOTE — ED Triage Notes (Signed)
Pt reports he is having increasing pain in abdomen and was discharged a week ago for bowel obstruction. Pt states pain is similar in nature.

## 2017-02-11 NOTE — ED Provider Notes (Signed)
[facility] DEPT Provider Note   CSN: [id number] Arrival date & time: 02/11/17  1945     History   Chief Complaint Chief Complaint  Patient presents with  . Abdominal Pain    HPI Oscar [name] is a 83 y.o. male.  The history is provided by the patient, the spouse and medical records. No language interpreter was used.  Abdominal Pain   This is a recurrent problem. The current episode started 6 to 12 hours ago. The problem occurs constantly. The problem has not changed since onset.The pain is associated with an unknown factor. The pain is located in the generalized abdominal region. The quality of the pain is aching and dull. The pain is at a severity of 8/10. The pain is severe. Associated symptoms include constipation. Pertinent negatives include fever, diarrhea, flatus (decreased), nausea, vomiting, frequency and headaches. The symptoms are aggravated by palpation. Nothing relieves the symptoms. Past workup includes surgery (hx of).    Past Medical History:  Diagnosis Date  . Acid reflux   . Barrett's esophagus   . BPH (benign prostatic hyperplasia)   . Colon polyps   . DDD (degenerative disc disease)   . Diabetes mellitus    controlled with diet and exercise  . ED (erectile dysfunction)   . Esophageal cancer ([place])    tx'd surgery - abdominal & right posterior chest approach (2001) - [place], [place]  . First degree AV block   . Gout   . Hemorrhoids   . History of palpitations   . Hyperlipidemia   . Hypertension   . Lipoma   . Obesity   . Osteoarthritis   . Tinea versicolor   . Tinnitus     Patient Active Problem List   Diagnosis Date Noted  . Partial small bowel obstruction ([place]) 01/29/2017  . SBO (small bowel obstruction) ([place]) 10/31/2016  . Aortic insufficiency 07/09/2013  . HTN (hypertension) 07/09/2013  . PERSONAL HISTORY MALIGNANT NEOPLASM ESOPHAGUS 07/17/2009  . GERD 07/13/2009  . BARRETT'S ESOPHAGUS 07/13/2009  . DYSPHAGIA 07/13/2009  .  FLATULENCE-GAS-BLOATING 07/13/2009    Past Surgical History:  Procedure Laterality Date  . bilateral olecranon bursal excisions    . Esophageal Cancer Surgery  2000, 2001  . GASTRIC RESECTION    . HEMORRHOID SURGERY    . NM MYOCAR PERF WALL MOTION  2011   dipyridamole myoview - stress images show medium in size, moderate in intensity perfusion defect in basal inferior & mid inferior walls with mild defect reversibility at rest, EF 64%  . TRANSTHORACIC ECHOCARDIOGRAM  2011   EF=>55%, mild mitral annular calcif, mild calcif of MV apparatus, mild TR, normal RSVP, mild AV regurg, aortic root sclerosis/calcifiication  . VASECTOMY         Home Medications    Prior to Admission medications   Medication Sig Start Date End Date Taking? Authorizing Provider  atenolol (TENORMIN) 25 MG tablet Take 1 tablet (25 mg total) by mouth daily. Patient taking differently: Take 25 mg by mouth.  07/09/13   Hilty, Nadean Corwin, MD  docusate sodium (COLACE) 100 MG capsule Take 1 capsule (100 mg total) by mouth 2 (two) times daily. 02/03/17   Regalado, Belkys A, MD  HYDROcodone-acetaminophen (NORCO/VICODIN) 5-325 MG per tablet Take 1 tablet by mouth every 6 (six) hours as needed for moderate pain.    [provider]  losartan (COZAAR) 100 MG tablet Take 0.5 tablets (50 mg total) by mouth daily. 11/03/16   Florencia Reasons, MD  magnesium oxide (  MAG-OX) 400 (241.3 Mg) MG tablet Take 0.5 tablets (200 mg total) by mouth 2 (two) times daily. 02/03/17   Regalado, Belkys A, MD  omeprazole (PRILOSEC) 20 MG capsule Take 40 mg by mouth 2 (two) times daily before a meal.     [provider]  potassium chloride SA (K-DUR,KLOR-CON) 20 MEQ tablet Take 1 tablet (20 mEq total) by mouth 2 (two) times daily. 02/03/17   Regalado, Belkys A, MD  Saw Palmetto, Serenoa repens, (SAW PALMETTO PO) Take 2 capsules by mouth daily.    [provider]  senna-docusate (SENOKOT-S) 8.6-50 MG tablet Take 1 tablet by mouth at bedtime.  11/02/16   Florencia Reasons, MD  traMADol (ULTRAM) 50 MG tablet Take 50-100 mg by mouth 3 (three) times daily as needed for moderate pain or severe pain.  10/06/16   [provider]  vitamin B-12 (CYANOCOBALAMIN) 1000 MCG tablet Take 1,000 mcg by mouth daily.    [provider]    Family History Family History  Problem Relation Age of Onset  . Diabetes Mother   . CAD Mother   . Stroke Sister   . Colon cancer Neg Hx   . Esophageal cancer Neg Hx   . Rectal cancer Neg Hx   . Stomach cancer Neg Hx     Social History Social History  Substance Use Topics  . Smoking status: Current Some Day Smoker    Years: 50.00    Types: Cigars, Pipe  . Smokeless tobacco: Never Used     Comment: 2-3x/daily, sometimes not at all , tobacco info given 08/01/13  . Alcohol use 3.5 - 7.0 oz/week    7 - 14 Standard drinks or equivalent per week     Allergies   Bee venom   Review of Systems Review of Systems  Constitutional: Negative for chills, diaphoresis, fatigue and fever.  HENT: Negative for congestion.   Respiratory: Negative for cough, chest tightness, shortness of breath, wheezing and stridor.   Cardiovascular: Negative for chest pain and palpitations.  Gastrointestinal: Positive for abdominal distention, abdominal pain and constipation. Negative for blood in stool, diarrhea, flatus (decreased), nausea and vomiting.  Genitourinary: Negative for flank pain and frequency.  Musculoskeletal: Negative for back pain, neck pain and neck stiffness.  Skin: Negative for rash and wound.  Neurological: Negative for light-headedness, numbness and headaches.  Psychiatric/Behavioral: Negative for agitation.  All other systems reviewed and are negative.    Physical Exam Updated Vital Signs BP (!) 169/93 (BP Location: Left Arm)   Pulse 66   Temp 98 F (36.7 C) (Oral)   Resp 20   Ht 6' 1.5" (1.867 m)   Wt 89.8 kg (198 lb)   SpO2 98%   BMI 25.77 kg/m   Physical Exam  Constitutional: He  is oriented to person, place, and time. He appears well-developed and well-nourished. No distress.  HENT:  Head: Normocephalic and atraumatic.  Nose: Nose normal.  Mouth/Throat: Oropharynx is clear and moist. No oropharyngeal exudate.  Eyes: Pupils are equal, round, and reactive to light. Conjunctivae are normal.  Neck: Normal range of motion. Neck supple.  Cardiovascular: Normal rate and intact distal pulses.   No murmur heard. Pulmonary/Chest: Effort normal and breath sounds normal. No stridor. No respiratory distress. He has no wheezes. He exhibits no tenderness.  Abdominal: Soft. He exhibits distension. There is tenderness in the right upper quadrant, epigastric area, periumbilical area and left upper quadrant. There is no guarding.    Musculoskeletal: He exhibits no edema  or tenderness.  Neurological: He is alert and oriented to person, place, and time. No sensory deficit. He exhibits normal muscle tone.  Skin: Skin is warm and dry. Capillary refill takes less than 2 seconds. He is not diaphoretic. No erythema. No pallor.  Psychiatric: He has a normal mood and affect.  Nursing note and vitals reviewed.    ED Treatments / Results  Labs (all labs ordered are listed, but only abnormal results are displayed) Labs Reviewed  COMPREHENSIVE METABOLIC PANEL - Abnormal; Notable for the following:       Result Value   Chloride 96 (*)    Glucose, Bld 116 (*)    Total Protein 8.4 (*)    ALT 16 (*)    GFR calc non Af Amer 57 (*)    All other components within normal limits  CBC - Abnormal; Notable for the following:    WBC 18.2 (*)    Platelets 477 (*)    All other components within normal limits  URINALYSIS, ROUTINE W REFLEX MICROSCOPIC - Abnormal; Notable for the following:    Hgb urine dipstick SMALL (*)    Ketones, ur 5 (*)    Protein, ur 30 (*)    Squamous Epithelial / LPF 0-5 (*)    All other components within normal limits  LIPASE, BLOOD  PROTIME-INR  I-STAT CG4 LACTIC  ACID, ED  I-STAT CG4 LACTIC ACID, ED    EKG  EKG Interpretation None       Radiology Ct Abdomen Pelvis W Contrast  Result Date: 02/11/2017 CLINICAL DATA:  Abdominal pain and distention. Recent history of obstruction. EXAM: CT ABDOMEN AND PELVIS WITH CONTRAST TECHNIQUE: Multidetector CT imaging of the abdomen and pelvis was performed using the standard protocol following bolus administration of intravenous contrast. CONTRAST:  100 mL Isovue-300 COMPARISON:  01/29/2017 FINDINGS: Lower chest: Linear scarring in the lung bases. The stomach is mostly in the chest. This likely represents a prior esophagectomy with esophago gastrostomy. Multiple surgical clips in the upper abdomen. Hepatobiliary: Cholelithiasis with multiple small stones in the gallbladder. No gallbladder wall thickening or edema. No bile duct dilatation. No focal liver lesions. Pancreas: Unremarkable. No pancreatic ductal dilatation or surrounding inflammatory changes. Spleen: Normal in size without focal abnormality. Adrenals/Urinary Tract: No adrenal gland nodules. Small left renal cysts. Nephrograms are symmetrical. No hydronephrosis or hydroureter. Bladder wall is not thickened. Stomach/Bowel: Prominent dilated fluid and air-filled small bowel without wall thickening. The terminal ileum is decompressed with transition zone in the low mid abdomen. This likely represents either a adhesions or an internal hernia. Similar findings are present on the previous study. The appearance is consistent with small bowel obstruction. Scattered stool in the colon. Colon is mostly decompressed. Appendix is normal. Vascular/Lymphatic: Aortic atherosclerosis. No enlarged abdominal or pelvic lymph nodes. Reproductive: Prostate gland is enlarged, measuring 6.2 cm in diameter. Other: Small amount of free fluid in the pelvis is likely reactive. No free air. Minimal periumbilical hernia containing fat. Musculoskeletal: Degenerative changes in the spine. Slight  anterior subluxation at L4-5 is likely degenerative. No destructive bone lesions. IMPRESSION: 1. Small bowel obstruction with transition zone in the low mid abdomen similar to prior study. Etiology could be a adhesions or internal hernia. 2. Postoperative esophagectomy with esophago gastrostomy. 3. Cholelithiasis without evidence of cholecystitis. 4. Enlarged prostate gland. 5. Small amount of free fluid in the pelvis. 6. Aortic atherosclerosis. 7. Degenerative changes in the spine. 8. Minimal periumbilical hernia containing fat. Electronically Signed   By: Gwyndolyn Saxon  Gerilyn Nestle M.D.   On: 02/11/2017 23:48    Procedures Procedures (including critical care time)  Medications Ordered in ED Medications  iopamidol (ISOVUE-300) 61 % injection (100 mLs Intravenous Canceled Entry 02/11/17 2327)  morphine 4 MG/ML injection 4 mg (4 mg Intravenous Given 02/11/17 2137)  iopamidol (ISOVUE-300) 61 % injection 100 mL (100 mLs Intravenous Contrast Given 02/11/17 2331)     Initial Impression / Assessment and Plan / ED Course  I have reviewed the triage vital signs and the nursing notes.  Pertinent labs & imaging results that were available during my care of the patient were reviewed by me and considered in my medical decision making (see chart for details).     PRECILIANO CASTELL is a 83 y.o. male with a past medical history significant for hypertension, hyperlipidemia, prior esophageal cancer, diabetes, GERD, and recent small bowel obstruction who presents with abdominal distention, abdominal pain, decreased flatus, and decreased urination. Patient says that this feels "just like my small bowel obstruction" for which she was recently discharged. He describes that he was feeling good this morning and was mowing grass. He says that after church, he had gradual onset of abdominal pain and distention. He has had burping but no significant nausea or vomiting. He says that his flatus as decreased this afternoon and he has not  had a bowel movement since this morning. He reports that the bowel movement this morning was smaller in caliber. He describes his pain as 8 out of 10 and aching in the upper abdomen. He reports it was 10 out of 10 before he took a hydrocodone. He denies fevers or chills. He denies any diarrhea. He says that he has been able to drink fluids today and is trying to stay hydrated. He denies traumatic injuries or other complaints.  On exam, patient has tenderness across his upper abdomen. No CVA tenderness. Lungs clear. No focal neurologic deficits. Pulses in all extremity. Patient has a large scar on his abdomen from prior surgery.  Based on patient's description of this feeling just like his recent as by mouth, patient will have imaging and laboratory testing to workup for possible SBO. Patient will be given pain medicine. Patient made nothing by mouth.  Anticipate reassessment after imaging.  Initial laboratory revealed leukocytosis of 18.2. Lipase not elevated. INR normal. Lactic acid nonelevated. Metabolic panel showed normal creatinine and normal LFTs.  CT imaging showed small bowel obstruction. Similar transition point.   General surgery was called and they recommended admission to medicine for nonoperative management as he recently had success with this. Patient is not vomiting and his pain is improving.  Hospitalist team will be called for admission. They requested chest x-ray due to the leukocytosis. This was ordered.  Patient will be admitted to hospitalist service for further management.   Final Clinical Impressions(s) / ED Diagnoses   Final diagnoses:  SBO (small bowel obstruction) (H[facility])  Nausea  Abdominal distension    Clinical Impression: 1. SBO (small bowel obstruction) ([place])   2. Nausea   3. Abdominal distension     Disposition: Admit to Hospitalist service    Tegeler, Gwenyth Allegra, MD 02/12/17 [id number]

## 2017-02-12 ENCOUNTER — Encounter (HOSPITAL_COMMUNITY): Payer: Self-pay | Admitting: Internal Medicine

## 2017-02-12 ENCOUNTER — Inpatient Hospital Stay (HOSPITAL_COMMUNITY): Payer: Medicare Other

## 2017-02-12 ENCOUNTER — Emergency Department (HOSPITAL_COMMUNITY): Payer: Medicare Other

## 2017-02-12 DIAGNOSIS — R109 Unspecified abdominal pain: Secondary | ICD-10-CM | POA: Diagnosis not present

## 2017-02-12 DIAGNOSIS — N400 Enlarged prostate without lower urinary tract symptoms: Secondary | ICD-10-CM | POA: Diagnosis present

## 2017-02-12 DIAGNOSIS — Z8249 Family history of ischemic heart disease and other diseases of the circulatory system: Secondary | ICD-10-CM | POA: Diagnosis not present

## 2017-02-12 DIAGNOSIS — K2270 Barrett's esophagus without dysplasia: Secondary | ICD-10-CM | POA: Diagnosis present

## 2017-02-12 DIAGNOSIS — K219 Gastro-esophageal reflux disease without esophagitis: Secondary | ICD-10-CM

## 2017-02-12 DIAGNOSIS — I10 Essential (primary) hypertension: Secondary | ICD-10-CM | POA: Diagnosis not present

## 2017-02-12 DIAGNOSIS — M1990 Unspecified osteoarthritis, unspecified site: Secondary | ICD-10-CM | POA: Diagnosis present

## 2017-02-12 DIAGNOSIS — E860 Dehydration: Secondary | ICD-10-CM | POA: Diagnosis not present

## 2017-02-12 DIAGNOSIS — E119 Type 2 diabetes mellitus without complications: Secondary | ICD-10-CM | POA: Diagnosis present

## 2017-02-12 DIAGNOSIS — R110 Nausea: Secondary | ICD-10-CM

## 2017-02-12 DIAGNOSIS — R140 Abdominal distension (gaseous): Secondary | ICD-10-CM | POA: Diagnosis not present

## 2017-02-12 DIAGNOSIS — D72829 Elevated white blood cell count, unspecified: Secondary | ICD-10-CM | POA: Diagnosis not present

## 2017-02-12 DIAGNOSIS — Z91030 Bee allergy status: Secondary | ICD-10-CM | POA: Diagnosis not present

## 2017-02-12 DIAGNOSIS — K56600 Partial intestinal obstruction, unspecified as to cause: Secondary | ICD-10-CM | POA: Diagnosis not present

## 2017-02-12 DIAGNOSIS — E440 Moderate protein-calorie malnutrition: Secondary | ICD-10-CM | POA: Diagnosis present

## 2017-02-12 DIAGNOSIS — F17290 Nicotine dependence, other tobacco product, uncomplicated: Secondary | ICD-10-CM | POA: Diagnosis present

## 2017-02-12 DIAGNOSIS — D72825 Bandemia: Secondary | ICD-10-CM

## 2017-02-12 DIAGNOSIS — I440 Atrioventricular block, first degree: Secondary | ICD-10-CM | POA: Diagnosis present

## 2017-02-12 DIAGNOSIS — Z6825 Body mass index (BMI) 25.0-25.9, adult: Secondary | ICD-10-CM | POA: Diagnosis not present

## 2017-02-12 DIAGNOSIS — G4733 Obstructive sleep apnea (adult) (pediatric): Secondary | ICD-10-CM | POA: Diagnosis present

## 2017-02-12 DIAGNOSIS — E876 Hypokalemia: Secondary | ICD-10-CM | POA: Diagnosis present

## 2017-02-12 DIAGNOSIS — K56609 Unspecified intestinal obstruction, unspecified as to partial versus complete obstruction: Secondary | ICD-10-CM | POA: Diagnosis not present

## 2017-02-12 DIAGNOSIS — Z86010 Personal history of colonic polyps: Secondary | ICD-10-CM | POA: Diagnosis not present

## 2017-02-12 DIAGNOSIS — I351 Nonrheumatic aortic (valve) insufficiency: Secondary | ICD-10-CM | POA: Diagnosis present

## 2017-02-12 DIAGNOSIS — Z823 Family history of stroke: Secondary | ICD-10-CM | POA: Diagnosis not present

## 2017-02-12 DIAGNOSIS — Z4682 Encounter for fitting and adjustment of non-vascular catheter: Secondary | ICD-10-CM | POA: Diagnosis not present

## 2017-02-12 DIAGNOSIS — Z833 Family history of diabetes mellitus: Secondary | ICD-10-CM | POA: Diagnosis not present

## 2017-02-12 DIAGNOSIS — R1084 Generalized abdominal pain: Secondary | ICD-10-CM | POA: Diagnosis present

## 2017-02-12 DIAGNOSIS — E785 Hyperlipidemia, unspecified: Secondary | ICD-10-CM | POA: Diagnosis present

## 2017-02-12 DIAGNOSIS — Z8501 Personal history of malignant neoplasm of esophagus: Secondary | ICD-10-CM | POA: Diagnosis not present

## 2017-02-12 LAB — COMPREHENSIVE METABOLIC PANEL
ALT: 15 U/L — AB (ref 17–63)
AST: 22 U/L (ref 15–41)
Albumin: 4.1 g/dL (ref 3.5–5.0)
Alkaline Phosphatase: 67 U/L (ref 38–126)
Anion gap: 10 (ref 5–15)
BUN: 13 mg/dL (ref 6–20)
CHLORIDE: 98 mmol/L — AB (ref 101–111)
CO2: 28 mmol/L (ref 22–32)
CREATININE: 0.98 mg/dL (ref 0.61–1.24)
Calcium: 9.3 mg/dL (ref 8.9–10.3)
GLUCOSE: 131 mg/dL — AB (ref 65–99)
POTASSIUM: 3.9 mmol/L (ref 3.5–5.1)
Sodium: 136 mmol/L (ref 135–145)
Total Bilirubin: 1 mg/dL (ref 0.3–1.2)
Total Protein: 7.3 g/dL (ref 6.5–8.1)

## 2017-02-12 LAB — CBC WITH DIFFERENTIAL/PLATELET
BASOS ABS: 0 10*3/uL (ref 0.0–0.1)
Basophils Relative: 0 %
EOS PCT: 1 %
Eosinophils Absolute: 0.1 10*3/uL (ref 0.0–0.7)
HEMATOCRIT: 40.4 % (ref 39.0–52.0)
Hemoglobin: 13.7 g/dL (ref 13.0–17.0)
LYMPHS PCT: 10 %
Lymphs Abs: 1.4 10*3/uL (ref 0.7–4.0)
MCH: 31.9 pg (ref 26.0–34.0)
MCHC: 33.9 g/dL (ref 30.0–36.0)
MCV: 94.2 fL (ref 78.0–100.0)
MONO ABS: 0.6 10*3/uL (ref 0.1–1.0)
Monocytes Relative: 5 %
NEUTROS ABS: 11.8 10*3/uL — AB (ref 1.7–7.7)
Neutrophils Relative %: 84 %
PLATELETS: 371 10*3/uL (ref 150–400)
RBC: 4.29 MIL/uL (ref 4.22–5.81)
RDW: 12.3 % (ref 11.5–15.5)
WBC: 13.9 10*3/uL — ABNORMAL HIGH (ref 4.0–10.5)

## 2017-02-12 LAB — PREALBUMIN: Prealbumin: 18.7 mg/dL (ref 18–38)

## 2017-02-12 LAB — CBC
HEMATOCRIT: 40.5 % (ref 39.0–52.0)
Hemoglobin: 13.9 g/dL (ref 13.0–17.0)
MCH: 32.3 pg (ref 26.0–34.0)
MCHC: 34.3 g/dL (ref 30.0–36.0)
MCV: 94 fL (ref 78.0–100.0)
PLATELETS: 375 10*3/uL (ref 150–400)
RBC: 4.31 MIL/uL (ref 4.22–5.81)
RDW: 12.2 % (ref 11.5–15.5)
WBC: 14.5 10*3/uL — AB (ref 4.0–10.5)

## 2017-02-12 LAB — PHOSPHORUS: Phosphorus: 4.4 mg/dL (ref 2.5–4.6)

## 2017-02-12 LAB — MAGNESIUM: Magnesium: 1.6 mg/dL — ABNORMAL LOW (ref 1.7–2.4)

## 2017-02-12 LAB — TSH: TSH: 1.124 u[IU]/mL (ref 0.350–4.500)

## 2017-02-12 MED ORDER — ACETAMINOPHEN 325 MG PO TABS
650.0000 mg | ORAL_TABLET | Freq: Four times a day (QID) | ORAL | Status: DC | PRN
  Filled 2017-02-12: qty 2

## 2017-02-12 MED ORDER — ONDANSETRON HCL 4 MG PO TABS: 4.0000 mg | ORAL_TABLET | Freq: Four times a day (QID) | ORAL | Status: DC | PRN

## 2017-02-12 MED ORDER — MORPHINE SULFATE (PF) 4 MG/ML IV SOLN
2.0000 mg | INTRAVENOUS | Status: DC | PRN
  Administered 2017-02-12 (×3): 2 mg via INTRAVENOUS
  Administered 2017-02-13: 06:00:00 4 mg via INTRAVENOUS
  Filled 2017-02-12 (×4): qty 1

## 2017-02-12 MED ORDER — ACETAMINOPHEN 650 MG RE SUPP: 650.0000 mg | Freq: Four times a day (QID) | RECTAL | Status: DC | PRN

## 2017-02-12 MED ORDER — LABETALOL HCL 5 MG/ML IV SOLN
10.0000 mg | INTRAVENOUS | Status: DC | PRN
  Filled 2017-02-12: qty 4

## 2017-02-12 MED ORDER — SODIUM CHLORIDE 0.9 % IV SOLN
INTRAVENOUS | Status: AC
  Administered 2017-02-12: 05:00:00 via INTRAVENOUS

## 2017-02-12 MED ORDER — BISACODYL 10 MG RE SUPP: 10.0000 mg | Freq: Every day | RECTAL | Status: DC | PRN

## 2017-02-12 MED ORDER — METOPROLOL TARTRATE 5 MG/5ML IV SOLN
2.5000 mg | Freq: Three times a day (TID) | INTRAVENOUS | Status: DC
  Administered 2017-02-12 – 2017-02-15 (×10): 2.5 mg via INTRAVENOUS
  Filled 2017-02-12 (×9): qty 5

## 2017-02-12 MED ORDER — PANTOPRAZOLE SODIUM 40 MG IV SOLR
40.0000 mg | INTRAVENOUS | Status: DC
  Administered 2017-02-12 – 2017-02-15 (×4): 40 mg via INTRAVENOUS
  Filled 2017-02-12 (×4): qty 40

## 2017-02-12 MED ORDER — MORPHINE SULFATE (PF) 2 MG/ML IV SOLN
2.0000 mg | INTRAVENOUS | Status: DC | PRN
  Administered 2017-02-12: 4 mg via INTRAVENOUS
  Filled 2017-02-12: qty 2

## 2017-02-12 MED ORDER — MAGNESIUM SULFATE 2 GM/50ML IV SOLN
2.0000 g | Freq: Once | INTRAVENOUS | Status: AC
  Administered 2017-02-12: 2 g via INTRAVENOUS
  Filled 2017-02-12: qty 50

## 2017-02-12 MED ORDER — ONDANSETRON HCL 4 MG/2ML IJ SOLN
4.0000 mg | Freq: Four times a day (QID) | INTRAMUSCULAR | Status: DC | PRN
  Administered 2017-02-12: 4 mg via INTRAVENOUS
  Filled 2017-02-12: qty 2

## 2017-02-12 NOTE — Progress Notes (Signed)
PROGRESS NOTE  Oscar [name] KDX:[id number] DOB: 17-Jul-1932 DOA: 02/11/2017 PCP: Deland Pretty, MD  HPI/Recap of past 24 hours:  On ng suction, denies ab pain, no bm/flatus  Assessment/Plan: Active Problems:   GERD   HTN (hypertension)   SBO (small bowel obstruction) (H[facility])   Leukocytosis   OSA (obstructive sleep apnea)   Dehydration   Recurrent Small bowel obstruction -  -He was hospitalized in 10/2016 and then 01/2017 for the same, both times improved with conservative management with ng suction. -possbile related to adhesions;  -on ng suction. -general surgery input appreciated,likely need surgery due to frequent recurrence. Case discussed with general surgery Dr Kae Heller  Hypokalemia/hypomagnesemia: replace k/mag, d/c hctz.   Hypertension - oral meds atenolol. hyzaar held , on iv low pressor with holding parameters.   OSA on CPAP.  History of aortic insufficiency and diastolic dysfunction - appears to be compensated. Question of alcohol abuse: No signs of alcohol withdrawal  H/o esophageal cancer s/p partial resection in 2001, followed by gi, last surveillance egd in 2015.  Gallstones - asymptomatic,lft unremarkable.  outpatient follow-up.  Code Status: full  Family Communication: patient   Disposition Plan: remain in the hospital   Consultants:  general surgery  Procedures:  Ng placement and ng suction  Antibiotics:  none   Objective: BP (!) 170/85 (BP Location: Left Arm)   Pulse 66   Temp 98 F (36.7 C) (Oral)   Resp (!) 22   Ht 6' 1.5" (1.867 m)   Wt 89.8 kg (198 lb)   SpO2 99%   BMI 25.77 kg/m   Intake/Output Summary (Last 24 hours) at 02/12/17 0856 Last data filed at 02/12/17 0600  Gross per 24 hour  Intake           763.33 ml  Output              710 ml  Net            53.33 ml   Filed Weights   02/11/17 2051  Weight: 89.8 kg (198 lb)    Exam: Patient is examined daily including today on 02/12/2017, exams remain the  same as of yesterday except that has changed    General:  NAD, looks younger than stated age  Cardiovascular: RRR  Respiratory: CTABL  Abdomen: Soft/ND/NT, decreased  BS  Musculoskeletal: No Edema  Neuro: alert, oriented   Data Reviewed: Basic Metabolic Panel:  Recent Labs Lab 02/11/17 2053 02/12/17 0508  NA 137 136  K 4.5 3.9  CL 96* 98*  CO2 30 28  GLUCOSE 116* 131*  BUN 14 13  CREATININE 1.17 0.98  CALCIUM 9.9 9.3  MG  --  1.6*  PHOS  --  4.4   Liver Function Tests:  Recent Labs Lab 02/11/17 2053 02/12/17 0508  AST 24 22  ALT 16* 15*  ALKPHOS 69 67  BILITOT 1.1 1.0  PROT 8.4* 7.3  ALBUMIN 4.7 4.1    Recent Labs Lab 02/11/17 2053  LIPASE 20   No results for input(s): AMMONIA in the last 168 hours. CBC:  Recent Labs Lab 02/11/17 2053 02/12/17 0508  WBC 18.2* 13.9*  14.5*  NEUTROABS  --  11.8*  HGB 14.4 13.7  13.9  HCT 42.7 40.4  40.5  MCV 95.3 94.2  94.0  PLT 477* 371  375   Cardiac Enzymes:   No results for input(s): CKTOTAL, CKMB, CKMBINDEX, TROPONINI in the last 168 hours. BNP (last 3 results) No results for input(s):  BNP in the last 8760 hours.  ProBNP (last 3 results) No results for input(s): PROBNP in the last 8760 hours.  CBG: No results for input(s): GLUCAP in the last 168 hours.  No results found for this or any previous visit (from the past 240 hour(s)).   Studies: Dg Chest 2 View  Result Date: 02/12/2017 CLINICAL DATA:  Increasing abdominal pain, leukocytosis, recent bowel obstruction, history hypertension, esophageal cancer, diabetes mellitus EXAM: CHEST  2 VIEW COMPARISON:  07/30/2010 FINDINGS: Enlargement of cardiac silhouette. Atherosclerotic calcification aorta. Slight vascular congestion. Emphysematous changes with increased markings at RIGHT base versus previous exam question mild atelectasis versus infiltrate. Remaining lungs clear. No pleural effusion or pneumothorax. IMPRESSION: Enlargement of cardiac  silhouette. Emphysematous changes with question atelectasis versus infiltrate at RIGHT base. Electronically Signed   By: Lavonia Dana M.D.   On: 02/12/2017 00:40   Dg Abdomen 1 View  Result Date: 02/12/2017 CLINICAL DATA:  Confirm NG placement EXAM: ABDOMEN - 1 VIEW COMPARISON:  CT abdomen and pelvis 02/11/2017 FINDINGS: The NG tube tip is at the level of the abdominal hiatus. This likely represents location within the thoracic portion of the stomach as seen on previous CT. Surgical clips in the right upper quadrant. Distended gas-filled small bowel consistent with small bowel obstruction. IMPRESSION: Enteric tube tip is at the abdominal hiatus consistent with location in the intrathoracic stomach. Electronically Signed   By: Lucienne Capers M.D.   On: 02/12/2017 01:50   Ct Abdomen Pelvis W Contrast  Result Date: 02/11/2017 CLINICAL DATA:  Abdominal pain and distention. Recent history of obstruction. EXAM: CT ABDOMEN AND PELVIS WITH CONTRAST TECHNIQUE: Multidetector CT imaging of the abdomen and pelvis was performed using the standard protocol following bolus administration of intravenous contrast. CONTRAST:  100 mL Isovue-300 COMPARISON:  01/29/2017 FINDINGS: Lower chest: Linear scarring in the lung bases. The stomach is mostly in the chest. This likely represents a prior esophagectomy with esophago gastrostomy. Multiple surgical clips in the upper abdomen. Hepatobiliary: Cholelithiasis with multiple small stones in the gallbladder. No gallbladder wall thickening or edema. No bile duct dilatation. No focal liver lesions. Pancreas: Unremarkable. No pancreatic ductal dilatation or surrounding inflammatory changes. Spleen: Normal in size without focal abnormality. Adrenals/Urinary Tract: No adrenal gland nodules. Small left renal cysts. Nephrograms are symmetrical. No hydronephrosis or hydroureter. Bladder wall is not thickened. Stomach/Bowel: Prominent dilated fluid and air-filled small bowel without wall  thickening. The terminal ileum is decompressed with transition zone in the low mid abdomen. This likely represents either a adhesions or an internal hernia. Similar findings are present on the previous study. The appearance is consistent with small bowel obstruction. Scattered stool in the colon. Colon is mostly decompressed. Appendix is normal. Vascular/Lymphatic: Aortic atherosclerosis. No enlarged abdominal or pelvic lymph nodes. Reproductive: Prostate gland is enlarged, measuring 6.2 cm in diameter. Other: Small amount of free fluid in the pelvis is likely reactive. No free air. Minimal periumbilical hernia containing fat. Musculoskeletal: Degenerative changes in the spine. Slight anterior subluxation at L4-5 is likely degenerative. No destructive bone lesions. IMPRESSION: 1. Small bowel obstruction with transition zone in the low mid abdomen similar to prior study. Etiology could be a adhesions or internal hernia. 2. Postoperative esophagectomy with esophago gastrostomy. 3. Cholelithiasis without evidence of cholecystitis. 4. Enlarged prostate gland. 5. Small amount of free fluid in the pelvis. 6. Aortic atherosclerosis. 7. Degenerative changes in the spine. 8. Minimal periumbilical hernia containing fat. Electronically Signed   By: Oren Beckmann.D.  On: 02/11/2017 23:48    Scheduled Meds: . iopamidol        Continuous Infusions: . sodium chloride 100 mL/hr at 02/12/17 0522  . magnesium sulfate 1 - 4 g bolus IVPB       Time spent: 35 mins from 9am to 9:35am I have personally reviewed and interpreted on  02/12/2017 daily labs,  imagings as discussed above under date review session and assessment and plans.  I reviewed all nursing notes, consultant notes,  vitals, pertinent old records  I have discussed plan of care as described above with RN , patient  on 02/12/2017   Marybella Ethier MD, PhD  [facility] Pager [phone]. If 7PM-7AM, please contact night-coverage at [email or web address],  password [facility] 02/12/2017, 8:56 AM  LOS: 0 days

## 2017-02-12 NOTE — Progress Notes (Signed)
Home medications (Tramadol 50mg  63 tabs, hydrocodone/APAp 5/525mg  27 tabs, Losartan 100-12.5mg  11.5 tabs and Atenolol 25mg  64 tabs) checked with patient and sent to pharmacy.

## 2017-02-12 NOTE — Consult Note (Signed)
Surgical Consultation Requesting provider: Dr. Erlinda Hong  CC: abdominal pain  HPI: Well known to [place] having 2 prior recent admissions for small bowel obstruction which resolved without intervention. He returns now with abdominal distention, nausea but no emesis. Last bm/flatus yesterday. NG has been placed with bilious output. Prior transhiatal esophagectomy.  Allergies  Allergen Reactions  . Bee Venom     Past Medical History:  Diagnosis Date  . Acid reflux   . Barrett's esophagus   . BPH (benign prostatic hyperplasia)   . Colon polyps   . DDD (degenerative disc disease)   . Diabetes mellitus    controlled with diet and exercise  . ED (erectile dysfunction)   . Esophageal cancer ([place])    tx'd surgery - abdominal & right posterior chest approach (2001) - [place], [place]  . First degree AV block   . Gout   . Hemorrhoids   . History of palpitations   . Hyperlipidemia   . Hypertension   . Lipoma   . Obesity   . Osteoarthritis   . Tinea versicolor   . Tinnitus     Past Surgical History:  Procedure Laterality Date  . bilateral olecranon bursal excisions    . Esophageal Cancer Surgery  2000, 2001  . GASTRIC RESECTION    . HEMORRHOID SURGERY    . NM MYOCAR PERF WALL MOTION  2011   dipyridamole myoview - stress images show medium in size, moderate in intensity perfusion defect in basal inferior & mid inferior walls with mild defect reversibility at rest, EF 64%  . TRANSTHORACIC ECHOCARDIOGRAM  2011   EF=>55%, mild mitral annular calcif, mild calcif of MV apparatus, mild TR, normal RSVP, mild AV regurg, aortic root sclerosis/calcifiication  . VASECTOMY      Family History  Problem Relation Age of Onset  . Diabetes Mother   . CAD Mother   . Stroke Sister   . Colon cancer Neg Hx   . Esophageal cancer Neg Hx   . Rectal cancer Neg Hx   . Stomach cancer Neg Hx     Social History   Social History  . Marital status: Married    Spouse name: N/A  . Number of children: 4  .  Years of education: N/A   Occupational History  . self employed    Social History Main Topics  . Smoking status: Current Some Day Smoker    Years: 50.00    Types: Cigars, Pipe  . Smokeless tobacco: Never Used     Comment: 2-3x/daily, sometimes not at all , tobacco info given 08/01/13  . Alcohol use 3.5 - 7.0 oz/week    7 - 14 Standard drinks or equivalent per week  . Drug use: No  . Sexual activity: Not Asked   Other Topics Concern  . None   Social History Narrative  . None    No current facility-administered medications on file prior to encounter.    Current Outpatient Prescriptions on File Prior to Encounter  Medication Sig Dispense Refill  . atenolol (TENORMIN) 25 MG tablet Take 1 tablet (25 mg total) by mouth daily. 30 tablet 11  . docusate sodium (COLACE) 100 MG capsule Take 1 capsule (100 mg total) by mouth 2 (two) times daily. 10 capsule 0  . HYDROcodone-acetaminophen (NORCO/VICODIN) 5-325 MG per tablet Take 1 tablet by mouth every 6 (six) hours as needed for moderate pain.    . magnesium oxide (MAG-OX) 400 (241.3 Mg) MG tablet Take 0.5 tablets (200 mg total)  by mouth 2 (two) times daily. 10 tablet 0  . omeprazole (PRILOSEC) 20 MG capsule Take 40 mg by mouth 2 (two) times daily before a meal.     . potassium chloride SA (K-DUR,KLOR-CON) 20 MEQ tablet Take 1 tablet (20 mEq total) by mouth 2 (two) times daily. 4 tablet 0  . senna-docusate (SENOKOT-S) 8.6-50 MG tablet Take 1 tablet by mouth at bedtime. 30 tablet 0  . traMADol (ULTRAM) 50 MG tablet Take 50-100 mg by mouth 3 (three) times daily as needed for moderate pain or severe pain.   1  . losartan (COZAAR) 100 MG tablet Take 0.5 tablets (50 mg total) by mouth daily. (Patient not taking: Reported on 02/11/2017) 30 tablet 0    Review of Systems: a complete, 10pt review of systems was completed with pertinent positives and negatives as documented in the HPI  Physical Exam: Vitals:   02/12/17 0300 02/12/17 0352  BP: (!)  196/90 (!) 170/85  Pulse: 76 66  Resp: (!) 24 (!) 22  Temp:  98 F (36.7 C)  SpO2: 92% 99%   Gen: A&Ox3, no distress  Head: normocephalic, atraumatic, EOMI, anicteric.  Neck: supple without mass or thyromegaly Chest: unlabored respirations, symmetrical air entry   Cardiovascular: RRR with palpable distal pulses, no pedal edema Abdomen: soft, minimally distended, mild tenderness in midabdomen. No mass or organomegaly.  Extremities: warm, without edema, no deformities  Neuro: grossly intact Psych: appropriate mood and affect normal insight Skin: warm and dry   CBC Latest Ref Rng & Units 02/12/2017 02/12/2017 02/11/2017  WBC 4.0 - 10.5 K/uL 13.9(H) 14.5(H) 18.2(H)  Hemoglobin 13.0 - 17.0 g/dL 13.7 13.9 14.4  Hematocrit 39.0 - 52.0 % 40.4 40.5 42.7  Platelets 150 - 400 K/uL 371 375 477(H)    CMP Latest Ref Rng & Units 02/12/2017 02/11/2017 02/03/2017  Glucose 65 - 99 mg/dL 131(H) 116(H) 124(H)  BUN 6 - 20 mg/dL 13 14 5(L)  Creatinine 0.61 - 1.24 mg/dL 0.98 1.17 0.82  Sodium 135 - 145 mmol/L 136 137 138  Potassium 3.5 - 5.1 mmol/L 3.9 4.5 3.3(L)  Chloride 101 - 111 mmol/L 98(L) 96(L) 108  CO2 22 - 32 mmol/L 28 30 24   Calcium 8.9 - 10.3 mg/dL 9.3 9.9 8.4(L)  Total Protein 6.5 - 8.1 g/dL 7.3 8.4(H) -  Total Bilirubin 0.3 - 1.2 mg/dL 1.0 1.1 -  Alkaline Phos 38 - 126 U/L 67 69 -  AST 15 - 41 U/L 22 24 -  ALT 17 - 63 U/L 15(L) 16(L) -    Lab Results  Component Value Date   INR 0.98 02/11/2017    Imaging: Dg Chest 2 View  Result Date: 02/12/2017 CLINICAL DATA:  Increasing abdominal pain, leukocytosis, recent bowel obstruction, history hypertension, esophageal cancer, diabetes mellitus EXAM: CHEST  2 VIEW COMPARISON:  07/30/2010 FINDINGS: Enlargement of cardiac silhouette. Atherosclerotic calcification aorta. Slight vascular congestion. Emphysematous changes with increased markings at RIGHT base versus previous exam question mild atelectasis versus infiltrate. Remaining lungs clear.  No pleural effusion or pneumothorax. IMPRESSION: Enlargement of cardiac silhouette. Emphysematous changes with question atelectasis versus infiltrate at RIGHT base. Electronically Signed   By: Lavonia Dana M.D.   On: 02/12/2017 00:40   Dg Abdomen 1 View  Result Date: 02/12/2017 CLINICAL DATA:  Confirm NG placement EXAM: ABDOMEN - 1 VIEW COMPARISON:  CT abdomen and pelvis 02/11/2017 FINDINGS: The NG tube tip is at the level of the abdominal hiatus. This likely represents location within the thoracic portion of  the stomach as seen on previous CT. Surgical clips in the right upper quadrant. Distended gas-filled small bowel consistent with small bowel obstruction. IMPRESSION: Enteric tube tip is at the abdominal hiatus consistent with location in the intrathoracic stomach. Electronically Signed   By: Lucienne Capers M.D.   On: 02/12/2017 01:50   Ct Abdomen Pelvis W Contrast  Result Date: 02/11/2017 CLINICAL DATA:  Abdominal pain and distention. Recent history of obstruction. EXAM: CT ABDOMEN AND PELVIS WITH CONTRAST TECHNIQUE: Multidetector CT imaging of the abdomen and pelvis was performed using the standard protocol following bolus administration of intravenous contrast. CONTRAST:  100 mL Isovue-300 COMPARISON:  01/29/2017 FINDINGS: Lower chest: Linear scarring in the lung bases. The stomach is mostly in the chest. This likely represents a prior esophagectomy with esophago gastrostomy. Multiple surgical clips in the upper abdomen. Hepatobiliary: Cholelithiasis with multiple small stones in the gallbladder. No gallbladder wall thickening or edema. No bile duct dilatation. No focal liver lesions. Pancreas: Unremarkable. No pancreatic ductal dilatation or surrounding inflammatory changes. Spleen: Normal in size without focal abnormality. Adrenals/Urinary Tract: No adrenal gland nodules. Small left renal cysts. Nephrograms are symmetrical. No hydronephrosis or hydroureter. Bladder wall is not thickened.  Stomach/Bowel: Prominent dilated fluid and air-filled small bowel without wall thickening. The terminal ileum is decompressed with transition zone in the low mid abdomen. This likely represents either a adhesions or an internal hernia. Similar findings are present on the previous study. The appearance is consistent with small bowel obstruction. Scattered stool in the colon. Colon is mostly decompressed. Appendix is normal. Vascular/Lymphatic: Aortic atherosclerosis. No enlarged abdominal or pelvic lymph nodes. Reproductive: Prostate gland is enlarged, measuring 6.2 cm in diameter. Other: Small amount of free fluid in the pelvis is likely reactive. No free air. Minimal periumbilical hernia containing fat. Musculoskeletal: Degenerative changes in the spine. Slight anterior subluxation at L4-5 is likely degenerative. No destructive bone lesions. IMPRESSION: 1. Small bowel obstruction with transition zone in the low mid abdomen similar to prior study. Etiology could be a adhesions or internal hernia. 2. Postoperative esophagectomy with esophago gastrostomy. 3. Cholelithiasis without evidence of cholecystitis. 4. Enlarged prostate gland. 5. Small amount of free fluid in the pelvis. 6. Aortic atherosclerosis. 7. Degenerative changes in the spine. 8. Minimal periumbilical hernia containing fat. Electronically Signed   By: Lucienne Capers M.D.   On: 02/11/2017 23:48     A/P: 83yo gentleman, third admission for SBO in as many months.  Continue NG decompression today. Will likely recommend exploration this admission to prevent further recurrences given short interval recurrence. Will check prealbumin, may need TNA.   Romana Juniper, MD [facility], [place] Pager [phone]

## 2017-02-12 NOTE — H&P (Signed)
[name]:[id number] DOB: Jun 12, 1932 DOA: 02/11/2017     PCP: Deland Pretty, MD   Outpatient Specialists: Kathrine Cords Patient coming from:    home Lives  With family    Chief Complaint: Abdominal pain distention nausea  HPI: Oscar [name] is a 83 y.o. male with medical history significant of hypertension, remote esophageal cancer status post surgery in 2001, OSA    Presented with abdominal distention and nausea no vomiting similar to prior episode of small bowel obstruction as is his third episode this year both prior episodes have resolved with conservative management. Last time patient required NG tube placement and improved with that.     Regarding pertinent Chronic problems: History of sleep apnea usually on C Pap History of esophageal cancer status post resection in 2001 currently stable   IN ER:  Temp (24hrs), Avg:98 F (36.7 C), Min:98 F (36.7 C), Max:98 F (36.7 C)      on arrival  ED Triage Vitals  Enc Vitals Group     BP 02/11/17 2045 (!) 169/93     Pulse Rate 02/11/17 2045 66     Resp 02/11/17 2045 20     Temp 02/11/17 2045 98 F (36.7 C)     Temp Source 02/11/17 2045 Oral     SpO2 02/11/17 2045 98 %     Weight 02/11/17 2051 198 lb (89.8 kg)     Height 02/11/17 2051 6' 1.5" (1.867 m)     Head Circumference --      Peak Flow --      Pain Score 02/11/17 2051 10     Pain Loc --      Pain Edu? --      Excl. in GC? --     Latest RR 14 satting 97% HR 73 BP 183/88 Lactic acid 1.04 INR 0.98 lipase 20 Na 137 K 4.5Glucose116Cr1.17protein 8.4 WBC 18.2 Hg14.4 plt 477 CTabd; small bowel obstruction with transition zone in the lower midabdomen similar to prior cholelithiasis but no cholecystitis  Following Medications were ordered in ER: Medications  iopamidol (ISOVUE-300) 61 % injection (100 mLs Intravenous Canceled Entry 02/11/17 2327)  morphine 4 MG/ML injection 4 mg (4 mg Intravenous Given 02/11/17 2137)  iopamidol (ISOVUE-300) 61 % injection 100 mL  (100 mLs Intravenous Contrast Given 02/11/17 2331)     ER provider discussed case with:  Dr. Barry Dienes with [facility] who recommends: Admission to medicine for conservative management will see patient in consult in the morning   Hospitalist was called for admission for small bowel obstruction   Review of Systems:    Pertinent positives include: abdominal pain, nausea,   Constitutional:  No weight loss, night sweats, Fevers, chills, fatigue, weight loss  HEENT:  No headaches, Difficulty swallowing,Tooth/dental problems,Sore throat,  No sneezing, itching, ear ache, nasal congestion, post nasal drip,  Cardio-vascular:  No chest pain, Orthopnea, PND, anasarca, dizziness, palpitations.no Bilateral lower extremity swelling  GI:  No heartburn, indigestionvomiting, diarrhea, change in bowel habits, loss of appetite, melena, blood in stool, hematemesis Resp:  no shortness of breath at rest. No dyspnea on exertion, No excess mucus, no productive cough, No non-productive cough, No coughing up of blood.No change in color of mucus.No wheezing. Skin:  no rash or lesions. No jaundice GU:  no dysuria, change in color of urine, no urgency or frequency. No straining to urinate.  No flank pain.  Musculoskeletal:  No joint pain or no joint swelling. No decreased range of motion. No  back pain.  Psych:  No change in mood or affect. No depression or anxiety. No memory loss.  Neuro: no localizing neurological complaints, no tingling, no weakness, no double vision, no gait abnormality, no slurred speech, no confusion  As per HPI otherwise 10 point review of systems negative.   Past Medical History: Past Medical History:  Diagnosis Date  . Acid reflux   . Barrett's esophagus   . BPH (benign prostatic hyperplasia)   . Colon polyps   . DDD (degenerative disc disease)   . Diabetes mellitus    controlled with diet and exercise  . ED (erectile dysfunction)   . Esophageal cancer ([place])    tx'd  surgery - abdominal & right posterior chest approach (2001) - [place], [place]  . First degree AV block   . Gout   . Hemorrhoids   . History of palpitations   . Hyperlipidemia   . Hypertension   . Lipoma   . Obesity   . Osteoarthritis   . Tinea versicolor   . Tinnitus    Past Surgical History:  Procedure Laterality Date  . bilateral olecranon bursal excisions    . Esophageal Cancer Surgery  2000, 2001  . GASTRIC RESECTION    . HEMORRHOID SURGERY    . NM MYOCAR PERF WALL MOTION  2011   dipyridamole myoview - stress images show medium in size, moderate in intensity perfusion defect in basal inferior & mid inferior walls with mild defect reversibility at rest, EF 64%  . TRANSTHORACIC ECHOCARDIOGRAM  2011   EF=>55%, mild mitral annular calcif, mild calcif of MV apparatus, mild TR, normal RSVP, mild AV regurg, aortic root sclerosis/calcifiication  . VASECTOMY       Social History:  Ambulatory   independently      reports that he has been smoking Cigars and Pipe.  He has smoked for the past 50.00 years. He has never used smokeless tobacco. He reports that he drinks about 3.5 - 7.0 oz of alcohol per week . He reports that he does not use drugs.  Allergies:   Allergies  Allergen Reactions  . Bee Venom      Family History:   Family History  Problem Relation Age of Onset  . Diabetes Mother   . CAD Mother   . Stroke Sister   . Colon cancer Neg Hx   . Esophageal cancer Neg Hx   . Rectal cancer Neg Hx   . Stomach cancer Neg Hx     Medications: Prior to Admission medications   Medication Sig Start Date End Date Taking? Authorizing Provider  atenolol (TENORMIN) 25 MG tablet Take 1 tablet (25 mg total) by mouth daily. 07/09/13  Yes Hilty, Nadean Corwin, MD  docusate sodium (COLACE) 100 MG capsule Take 1 capsule (100 mg total) by mouth 2 (two) times daily. 02/03/17  Yes Regalado, Belkys A, MD  HYDROcodone-acetaminophen (NORCO/VICODIN) 5-325 MG per tablet Take 1 tablet by mouth every 6  (six) hours as needed for moderate pain.   Yes [provider]  losartan-hydrochlorothiazide (HYZAAR) 100-12.5 MG tablet Take 1 tablet by mouth daily. 02/02/17  Yes [provider]  magnesium oxide (MAG-OX) 400 (241.3 Mg) MG tablet Take 0.5 tablets (200 mg total) by mouth 2 (two) times daily. 02/03/17  Yes Regalado, Belkys A, MD  omeprazole (PRILOSEC) 20 MG capsule Take 40 mg by mouth 2 (two) times daily before a meal.    Yes [provider]  potassium chloride SA (K-DUR,KLOR-CON) 20 MEQ tablet  Take 1 tablet (20 mEq total) by mouth 2 (two) times daily. 02/03/17  Yes Regalado, Belkys A, MD  senna-docusate (SENOKOT-S) 8.6-50 MG tablet Take 1 tablet by mouth at bedtime. 11/02/16  Yes Florencia Reasons, MD  traMADol (ULTRAM) 50 MG tablet Take 50-100 mg by mouth 3 (three) times daily as needed for moderate pain or severe pain.  10/06/16  Yes [provider]  losartan (COZAAR) 100 MG tablet Take 0.5 tablets (50 mg total) by mouth daily. Patient not taking: Reported on 02/11/2017 11/03/16   Florencia Reasons, MD    Physical Exam: Patient Vitals for the past 24 hrs:  BP Temp Temp src Pulse Resp SpO2 Height Weight  02/11/17 2051 - - - - - - 6' 1.5" (1.867 m) 89.8 kg (198 lb)  02/11/17 2045 (!) 169/93 98 F (36.7 C) Oral 66 20 98 % - -    1. General:  in No Acute distress  well  -appearing 2. Psychological: Alert and   Oriented 3. Head/ENT:     Dry Mucous Membranes                          Head Non traumatic, neck supple                           Poor Dentition 4. SKIN:  decreased Skin turgor,  Skin clean Dry and intact no rash 5. Heart: Regular rate and rhythm no Murmur, no Rub or gallop 6. Lungs:  Clear to auscultation bilaterally, no wheezes or crackles   7. Abdomen: Soft,  non-tender, distended  Diminished bowel sounds present 8. Lower extremities: no clubbing, cyanosis, or edema 9. Neurologically Grossly intact, moving all 4 extremities equally   10. MSK: Normal range of  motion   body mass index is 25.77 kg/m.  Labs on Admission:   Labs on Admission: I have personally reviewed following labs and imaging studies  CBC:  Recent Labs Lab 02/11/17 2053  WBC 18.2*  HGB 14.4  HCT 42.7  MCV 95.3  PLT 938*   Basic Metabolic Panel:  Recent Labs Lab 02/11/17 2053  NA 137  K 4.5  CL 96*  CO2 30  GLUCOSE 116*  BUN 14  CREATININE 1.17  CALCIUM 9.9   GFR: Estimated Creatinine Clearance: 56.8 mL/min (by C-G formula based on SCr of 1.17 mg/dL). Liver Function Tests:  Recent Labs Lab 02/11/17 2053  AST 24  ALT 16*  ALKPHOS 69  BILITOT 1.1  PROT 8.4*  ALBUMIN 4.7    Recent Labs Lab 02/11/17 2053  LIPASE 20   No results for input(s): AMMONIA in the last 168 hours. Coagulation Profile:  Recent Labs Lab 02/11/17 2118  INR 0.98   Cardiac Enzymes: No results for input(s): CKTOTAL, CKMB, CKMBINDEX, TROPONINI in the last 168 hours. BNP (last 3 results) No results for input(s): PROBNP in the last 8760 hours. HbA1C: No results for input(s): HGBA1C in the last 72 hours. CBG: No results for input(s): GLUCAP in the last 168 hours. Lipid Profile: No results for input(s): CHOL, HDL, LDLCALC, TRIG, CHOLHDL, LDLDIRECT in the last 72 hours. Thyroid Function Tests: No results for input(s): TSH, T4TOTAL, FREET4, T3FREE, THYROIDAB in the last 72 hours. Anemia Panel: No results for input(s): VITAMINB12, FOLATE, FERRITIN, TIBC, IRON, RETICCTPCT in the last 72 hours. Urine analysis:    Component Value Date/Time   COLORURINE YELLOW 02/11/2017 2308   APPEARANCEUR CLEAR 02/11/2017 2308  LABSPEC 1.020 02/11/2017 2308   PHURINE 5.0 02/11/2017 2308   GLUCOSEU NEGATIVE 02/11/2017 2308   HGBUR SMALL (A) 02/11/2017 2308   BILIRUBINUR NEGATIVE 02/11/2017 2308   KETONESUR 5 (A) 02/11/2017 2308   PROTEINUR 30 (A) 02/11/2017 2308   UROBILINOGEN 0.2 09/12/2014 1816   NITRITE NEGATIVE 02/11/2017 2308   LEUKOCYTESUR NEGATIVE 02/11/2017 2308    Sepsis Labs: @LABRCNTIP (procalcitonin:4,lacticidven:4) )No results found for this or any previous visit (from the past 240 hour(s)).     UA no evidence of UTI    No results found for: HGBA1C  Estimated Creatinine Clearance: 56.8 mL/min (by C-G formula based on SCr of 1.17 mg/dL).  BNP (last 3 results) No results for input(s): PROBNP in the last 8760 hours.   ECG REPORT Not obtained  [facility] Weights   02/11/17 2051  Weight: 89.8 kg (198 lb)     Cultures:    Component Value Date/Time   SDES MOUTH 10/23/2011 1554   SPECREQUEST LIPS 10/23/2011 1554   CULT No Herpes Simplex Virus detected. 10/23/2011 1554   REPTSTATUS 10/26/2011 FINAL 10/23/2011 1554     Radiological Exams on Admission: Ct Abdomen Pelvis W Contrast  Result Date: 02/11/2017 CLINICAL DATA:  Abdominal pain and distention. Recent history of obstruction. EXAM: CT ABDOMEN AND PELVIS WITH CONTRAST TECHNIQUE: Multidetector CT imaging of the abdomen and pelvis was performed using the standard protocol following bolus administration of intravenous contrast. CONTRAST:  100 mL Isovue-300 COMPARISON:  01/29/2017 FINDINGS: Lower chest: Linear scarring in the lung bases. The stomach is mostly in the chest. This likely represents a prior esophagectomy with esophago gastrostomy. Multiple surgical clips in the upper abdomen. Hepatobiliary: Cholelithiasis with multiple small stones in the gallbladder. No gallbladder wall thickening or edema. No bile duct dilatation. No focal liver lesions. Pancreas: Unremarkable. No pancreatic ductal dilatation or surrounding inflammatory changes. Spleen: Normal in size without focal abnormality. Adrenals/Urinary Tract: No adrenal gland nodules. Small left renal cysts. Nephrograms are symmetrical. No hydronephrosis or hydroureter. Bladder wall is not thickened. Stomach/Bowel: Prominent dilated fluid and air-filled small bowel without wall thickening. The terminal ileum is decompressed with transition  zone in the low mid abdomen. This likely represents either a adhesions or an internal hernia. Similar findings are present on the previous study. The appearance is consistent with small bowel obstruction. Scattered stool in the colon. Colon is mostly decompressed. Appendix is normal. Vascular/Lymphatic: Aortic atherosclerosis. No enlarged abdominal or pelvic lymph nodes. Reproductive: Prostate gland is enlarged, measuring 6.2 cm in diameter. Other: Small amount of free fluid in the pelvis is likely reactive. No free air. Minimal periumbilical hernia containing fat. Musculoskeletal: Degenerative changes in the spine. Slight anterior subluxation at L4-5 is likely degenerative. No destructive bone lesions. IMPRESSION: 1. Small bowel obstruction with transition zone in the low mid abdomen similar to prior study. Etiology could be a adhesions or internal hernia. 2. Postoperative esophagectomy with esophago gastrostomy. 3. Cholelithiasis without evidence of cholecystitis. 4. Enlarged prostate gland. 5. Small amount of free fluid in the pelvis. 6. Aortic atherosclerosis. 7. Degenerative changes in the spine. 8. Minimal periumbilical hernia containing fat. Electronically Signed   By: Lucienne Capers M.D.   On: 02/11/2017 23:48    Chart has been reviewed    Assessment/Plan   83 y.o. male with medical history significant of hypertension, remote esophageal cancer status post surgery in 2001, OSA Admitted for no bowel obstruction dehydration   Present on Admission:  . SBO (small bowel obstruction) (HCC) -  Cause unknown  -  admit for conservative management  - NG tube will attempt to place if unable will radiology NG tube placement in AM unless symptoms improved - NPO - KUB in AM - appreciate General surgery consult given recurrent episodes may need definitive mangment  . Leukocytosis -suspect partially reactive as well as hemoconcentration but monitor for any evidence of infection . OSA (obstructive sleep  apnea) - off on C Pap while having active small bowel obstruction abdominal distention . Dehydration - rehydrate . GERD restart home medications when able . HTN (hypertension) - hold by mouth medications for now, order labetalol when necessary  Other plan as per orders.  DVT prophylaxis:  SCD    Code Status:  FULL CODE  as per patient    Family Communication:   Family   at  Bedside  plan of care was discussed with   Wife,    Disposition Plan:      To home once workup is complete and patient is stable                                         Consults called: CCS  Admission status:    inpatient       Level of care    medical floor       I have spent a total of 56 min on this admission   Faun Mcqueen 02/12/2017, 1:00 AM    [facility]  Pager [phone]   after 2 AM please page floor coverage PA If 7AM-7PM, please contact the day team taking care of the patient  [email or web address]  Password [email or web address]

## 2017-02-12 NOTE — ED Notes (Signed)
Called floor to give report 300 AM

## 2017-02-13 ENCOUNTER — Inpatient Hospital Stay (HOSPITAL_COMMUNITY): Payer: Medicare Other

## 2017-02-13 DIAGNOSIS — D72829 Elevated white blood cell count, unspecified: Secondary | ICD-10-CM

## 2017-02-13 LAB — CBC WITH DIFFERENTIAL/PLATELET
BASOS PCT: 0 %
Basophils Absolute: 0 10*3/uL (ref 0.0–0.1)
Eosinophils Absolute: 0.1 10*3/uL (ref 0.0–0.7)
Eosinophils Relative: 1 %
HEMATOCRIT: 40.6 % (ref 39.0–52.0)
Hemoglobin: 13.3 g/dL (ref 13.0–17.0)
Lymphocytes Relative: 16 %
Lymphs Abs: 1.8 10*3/uL (ref 0.7–4.0)
MCH: 31.4 pg (ref 26.0–34.0)
MCHC: 32.8 g/dL (ref 30.0–36.0)
MCV: 96 fL (ref 78.0–100.0)
MONO ABS: 0.8 10*3/uL (ref 0.1–1.0)
MONOS PCT: 7 %
NEUTROS ABS: 8.5 10*3/uL — AB (ref 1.7–7.7)
Neutrophils Relative %: 76 %
Platelets: 423 10*3/uL — ABNORMAL HIGH (ref 150–400)
RBC: 4.23 MIL/uL (ref 4.22–5.81)
RDW: 12.4 % (ref 11.5–15.5)
WBC: 11.2 10*3/uL — ABNORMAL HIGH (ref 4.0–10.5)

## 2017-02-13 LAB — BASIC METABOLIC PANEL
Anion gap: 16 — ABNORMAL HIGH (ref 5–15)
BUN: 12 mg/dL (ref 6–20)
CALCIUM: 8.6 mg/dL — AB (ref 8.9–10.3)
CO2: 23 mmol/L (ref 22–32)
CREATININE: 1.11 mg/dL (ref 0.61–1.24)
Chloride: 98 mmol/L — ABNORMAL LOW (ref 101–111)
GFR calc non Af Amer: 60 mL/min — ABNORMAL LOW (ref >60)
GLUCOSE: 62 mg/dL — AB (ref 65–99)
Potassium: 4.2 mmol/L (ref 3.5–5.1)
Sodium: 137 mmol/L (ref 135–145)

## 2017-02-13 LAB — MAGNESIUM: Magnesium: 1.8 mg/dL (ref 1.7–2.4)

## 2017-02-13 MED ORDER — DEXTROSE-NACL 5-0.9 % IV SOLN
INTRAVENOUS | Status: DC
  Administered 2017-02-13: 75 mL/h via INTRAVENOUS
  Administered 2017-02-13 – 2017-02-15 (×3): via INTRAVENOUS

## 2017-02-13 MED ORDER — DIATRIZOATE MEGLUMINE & SODIUM 66-10 % PO SOLN
90.0000 mL | Freq: Once | ORAL | Status: AC
  Administered 2017-02-13: 11:00:00 90 mL via ORAL
  Filled 2017-02-13: qty 90

## 2017-02-13 MED ORDER — SODIUM CHLORIDE 0.9 % IV SOLN: INTRAVENOUS | Status: DC

## 2017-02-13 MED ORDER — PHENOL 1.4 % MT LIQD: 1.0000 | OROMUCOSAL | Status: DC | PRN

## 2017-02-13 MED ORDER — MENTHOL 3 MG MT LOZG: 1.0000 | LOZENGE | OROMUCOSAL | Status: DC | PRN

## 2017-02-13 NOTE — Progress Notes (Signed)
Dr. Kae Heller notified regarding SBO protocol procedure. MD stated that it will starts later in the morning. No orders at this time.

## 2017-02-13 NOTE — Progress Notes (Signed)
PROGRESS NOTE  Oscar [name] VEL:[id number] DOB: Apr 09, 1933 DOA: 02/11/2017 PCP: Deland Pretty, MD  HPI/Recap of past 24 hours:  On ng suction, denies ab pain, + flatus, feeling better  Assessment/Plan: Active Problems:   GERD   HTN (hypertension)   SBO (small bowel obstruction) (HCC)   Leukocytosis   OSA (obstructive sleep apnea)   Dehydration   Recurrent Small bowel obstruction -  -He was hospitalized in 10/2016 and then 01/2017 for the same, both times improved with conservative management with ng suction. -possbile related to adhesions;  -on ng suction. -feeling better, remain on conservative management -general surgery input appreciated,  Hypokalemia/hypomagnesemia: replace k/mag, d/c hctz.   Hypertension - oral meds atenolol. hyzaar held , on iv low pressor with holding parameters.   OSA on CPAP.  History of aortic insufficiency and diastolic dysfunction - appears to be compensated. Question of alcohol abuse: No signs of alcohol withdrawal  H/o esophageal cancer s/p partial resection in 2001, followed by gi, last surveillance egd in 2015.  Gallstones - asymptomatic,lft unremarkable.  outpatient follow-up.  Code Status: full  Family Communication: patient   Disposition Plan: remain in the hospital   Consultants:  general surgery  Procedures:  Ng placement and ng suction  Antibiotics:  none   Objective: BP 136/63 (BP Location: Right Arm)   Pulse 80   Temp 98.5 F (36.9 C) (Oral)   Resp 16   Ht 6' 1.5" (1.867 m)   Wt 89.8 kg (198 lb)   SpO2 97%   BMI 25.77 kg/m   Intake/Output Summary (Last 24 hours) at 02/13/17 0848 Last data filed at 02/13/17 0600  Gross per 24 hour  Intake                0 ml  Output             1350 ml  Net            -1350 ml   Filed Weights   02/11/17 2051  Weight: 89.8 kg (198 lb)    Exam: Patient is examined daily including today on 02/13/2017, exams remain the same as of yesterday except that has  changed    General:  NAD, looks younger than stated age  Cardiovascular: RRR  Respiratory: CTABL  Abdomen: Soft/ND/NT, decreased  BS  Musculoskeletal: No Edema  Neuro: alert, oriented   Data Reviewed: Basic Metabolic Panel:  Recent Labs Lab 02/11/17 2053 02/12/17 0508 02/13/17 0527  NA 137 136 137  K 4.5 3.9 4.2  CL 96* 98* 98*  CO2 30 28 23   GLUCOSE 116* 131* 62*  BUN 14 13 12   CREATININE 1.17 0.98 1.11  CALCIUM 9.9 9.3 8.6*  MG  --  1.6* 1.8  PHOS  --  4.4  --    Liver Function Tests:  Recent Labs Lab 02/11/17 2053 02/12/17 0508  AST 24 22  ALT 16* 15*  ALKPHOS 69 67  BILITOT 1.1 1.0  PROT 8.4* 7.3  ALBUMIN 4.7 4.1    Recent Labs Lab 02/11/17 2053  LIPASE 20   No results for input(s): AMMONIA in the last 168 hours. CBC:  Recent Labs Lab 02/11/17 2053 02/12/17 0508 02/13/17 0527  WBC 18.2* 13.9*  14.5* 11.2*  NEUTROABS  --  11.8* 8.5*  HGB 14.4 13.7  13.9 13.3  HCT 42.7 40.4  40.5 40.6  MCV 95.3 94.2  94.0 96.0  PLT 477* 371  375 423*   Cardiac Enzymes:   No results  for input(s): CKTOTAL, CKMB, CKMBINDEX, TROPONINI in the last 168 hours. BNP (last 3 results) No results for input(s): BNP in the last 8760 hours.  ProBNP (last 3 results) No results for input(s): PROBNP in the last 8760 hours.  CBG: No results for input(s): GLUCAP in the last 168 hours.  No results found for this or any previous visit (from the past 240 hour(s)).   Studies: No results found.  Scheduled Meds: . metoprolol tartrate  2.5 mg Intravenous Q8H  . pantoprazole (PROTONIX) IV  40 mg Intravenous Q24H    Continuous Infusions: . dextrose 5 % and 0.9% NaCl       Time spent: 25 mins from  I have personally reviewed and interpreted on  02/13/2017 daily labs,  imagings as discussed above under date review session and assessment and plans.  I reviewed all nursing notes, consultant notes,  vitals, pertinent old records  I have discussed plan of care as  described above with RN , patient  on 02/13/2017  Case discussed with general surgery  Vishal Sandlin MD, PhD  [facility] Pager [phone]. If 7PM-7AM, please contact night-coverage at [email or web address], password [facility] 02/13/2017, 8:48 AM  LOS: 1 day

## 2017-02-13 NOTE — Progress Notes (Signed)
[facility] Progress Note      Assessment/Plan   Active Problems:   GERD   HTN (hypertension)   SBO (small bowel obstruction) (HCC)   Leukocytosis   OSA (obstructive sleep apnea)   Dehydration  SBO - 3rd admission for SBO in a short timeframe   - NGT, IVF - pt states flatus this am so will hold off on SBO procotol fpr now - NGT output high but pt has been eating ice chips - may need exploration this admission to prevent further recurrences    LOS: 1 day    Subjective:  CC: throat irritation from tube  Pt states no abdominal pain. He feels better. He had flatus this am. No nausea.   Objective: Vital signs in last 24 hours: Temp:  [97.9 F (36.6 C)-98.5 F (36.9 C)] 98.5 F (36.9 C) (10/08 5397) Pulse Rate:  [70-80] 80 (10/08 0642) Resp:  [16-18] 16 (10/08 0642) BP: (131-176)/(54-82) 136/63 (10/08 0642) SpO2:  [95 %-98 %] 97 % (10/08 0642) Last BM Date: 02/03/17  Intake/Output from previous day: 10/07 0701 - 10/08 0700 In: 0  Out: 1350 [Urine:250; Emesis/NG output:1100] Intake/Output this shift: No intake/output data recorded.  PE: Gen:  Alert, NAD, pleasant, cooperative Card:  RRR, no M/G/R heard Pulm:  Rate and effort normal Abd: Soft, NT/ND, +BS, no HSM, no guarding Skin: no rashes noted, warm and dry   Anti-infectives: Anti-infectives    None      Lab Results:   Recent Labs  02/12/17 0508 02/13/17 0527  WBC 13.9*  14.5* 11.2*  HGB 13.7  13.9 13.3  HCT 40.4  40.5 40.6  PLT 371  375 423*   BMET  Recent Labs  02/12/17 0508 02/13/17 0527  NA 136 137  K 3.9 4.2  CL 98* 98*  CO2 28 23  GLUCOSE 131* 62*  BUN 13 12  CREATININE 0.98 1.11  CALCIUM 9.3 8.6*   PT/INR  Recent Labs  02/11/17 2118  LABPROT 12.9  INR 0.98   CMP     Component Value Date/Time   NA 137 02/13/2017 0527   K 4.2 02/13/2017 0527   CL 98 (L) 02/13/2017 0527   CO2 23 02/13/2017 0527   GLUCOSE 62 (L) 02/13/2017 0527   BUN 12  02/13/2017 0527   CREATININE 1.11 02/13/2017 0527   CREATININE 1.13 12/07/2012 1147   CALCIUM 8.6 (L) 02/13/2017 0527   PROT 7.3 02/12/2017 0508   ALBUMIN 4.1 02/12/2017 0508   AST 22 02/12/2017 0508   ALT 15 (L) 02/12/2017 0508   ALKPHOS 67 02/12/2017 0508   BILITOT 1.0 02/12/2017 0508   GFRNONAA 60 (L) 02/13/2017 0527   GFRAA >60 02/13/2017 0527   Lipase     Component Value Date/Time   LIPASE 20 02/11/2017 2053    Studies/Results: Dg Chest 2 View  Result Date: 02/12/2017 CLINICAL DATA:  Increasing abdominal pain, leukocytosis, recent bowel obstruction, history hypertension, esophageal cancer, diabetes mellitus EXAM: CHEST  2 VIEW COMPARISON:  07/30/2010 FINDINGS: Enlargement of cardiac silhouette. Atherosclerotic calcification aorta. Slight vascular congestion. Emphysematous changes with increased markings at RIGHT base versus previous exam question mild atelectasis versus infiltrate. Remaining lungs clear. No pleural effusion or pneumothorax. IMPRESSION: Enlargement of cardiac silhouette. Emphysematous changes with question atelectasis versus infiltrate at RIGHT base. Electronically Signed   By: Lavonia Dana M.D.   On: 02/12/2017 00:40   Dg Abdomen 1 View  Result Date: 02/12/2017 CLINICAL DATA:  Confirm NG placement EXAM: ABDOMEN - 1  VIEW COMPARISON:  CT abdomen and pelvis 02/11/2017 FINDINGS: The NG tube tip is at the level of the abdominal hiatus. This likely represents location within the thoracic portion of the stomach as seen on previous CT. Surgical clips in the right upper quadrant. Distended gas-filled small bowel consistent with small bowel obstruction. IMPRESSION: Enteric tube tip is at the abdominal hiatus consistent with location in the intrathoracic stomach. Electronically Signed   By: Lucienne Capers M.D.   On: 02/12/2017 01:50   Ct Abdomen Pelvis W Contrast  Result Date: 02/11/2017 CLINICAL DATA:  Abdominal pain and distention. Recent history of obstruction. EXAM: CT  ABDOMEN AND PELVIS WITH CONTRAST TECHNIQUE: Multidetector CT imaging of the abdomen and pelvis was performed using the standard protocol following bolus administration of intravenous contrast. CONTRAST:  100 mL Isovue-300 COMPARISON:  01/29/2017 FINDINGS: Lower chest: Linear scarring in the lung bases. The stomach is mostly in the chest. This likely represents a prior esophagectomy with esophago gastrostomy. Multiple surgical clips in the upper abdomen. Hepatobiliary: Cholelithiasis with multiple small stones in the gallbladder. No gallbladder wall thickening or edema. No bile duct dilatation. No focal liver lesions. Pancreas: Unremarkable. No pancreatic ductal dilatation or surrounding inflammatory changes. Spleen: Normal in size without focal abnormality. Adrenals/Urinary Tract: No adrenal gland nodules. Small left renal cysts. Nephrograms are symmetrical. No hydronephrosis or hydroureter. Bladder wall is not thickened. Stomach/Bowel: Prominent dilated fluid and air-filled small bowel without wall thickening. The terminal ileum is decompressed with transition zone in the low mid abdomen. This likely represents either a adhesions or an internal hernia. Similar findings are present on the previous study. The appearance is consistent with small bowel obstruction. Scattered stool in the colon. Colon is mostly decompressed. Appendix is normal. Vascular/Lymphatic: Aortic atherosclerosis. No enlarged abdominal or pelvic lymph nodes. Reproductive: Prostate gland is enlarged, measuring 6.2 cm in diameter. Other: Small amount of free fluid in the pelvis is likely reactive. No free air. Minimal periumbilical hernia containing fat. Musculoskeletal: Degenerative changes in the spine. Slight anterior subluxation at L4-5 is likely degenerative. No destructive bone lesions. IMPRESSION: 1. Small bowel obstruction with transition zone in the low mid abdomen similar to prior study. Etiology could be a adhesions or internal hernia.  2. Postoperative esophagectomy with esophago gastrostomy. 3. Cholelithiasis without evidence of cholecystitis. 4. Enlarged prostate gland. 5. Small amount of free fluid in the pelvis. 6. Aortic atherosclerosis. 7. Degenerative changes in the spine. 8. Minimal periumbilical hernia containing fat. Electronically Signed   By: Lucienne Capers M.D.   On: 02/11/2017 23:48      Kalman Drape , [facility] Surgery 02/13/2017, 9:04 AM Pager: [phone] Consults: [phone] Mon-Fri 7:00 am-4:30 pm Sat-Sun 7:00 am-11:30 am

## 2017-02-14 DIAGNOSIS — E876 Hypokalemia: Secondary | ICD-10-CM

## 2017-02-14 DIAGNOSIS — Z8501 Personal history of malignant neoplasm of esophagus: Secondary | ICD-10-CM

## 2017-02-14 LAB — BASIC METABOLIC PANEL
ANION GAP: 11 (ref 5–15)
BUN: 10 mg/dL (ref 6–20)
CO2: 28 mmol/L (ref 22–32)
Calcium: 8.8 mg/dL — ABNORMAL LOW (ref 8.9–10.3)
Chloride: 101 mmol/L (ref 101–111)
Creatinine, Ser: 0.97 mg/dL (ref 0.61–1.24)
GFR calc Af Amer: >60 mL/min (ref >60)
GLUCOSE: 149 mg/dL — AB (ref 65–99)
POTASSIUM: 3.5 mmol/L (ref 3.5–5.1)
Sodium: 140 mmol/L (ref 135–145)

## 2017-02-14 LAB — CBC WITH DIFFERENTIAL/PLATELET
BASOS ABS: 0 10*3/uL (ref 0.0–0.1)
Basophils Relative: 0 %
EOS PCT: 1 %
Eosinophils Absolute: 0.1 10*3/uL (ref 0.0–0.7)
HEMATOCRIT: 38.1 % — AB (ref 39.0–52.0)
Hemoglobin: 12.5 g/dL — ABNORMAL LOW (ref 13.0–17.0)
LYMPHS ABS: 1.4 10*3/uL (ref 0.7–4.0)
LYMPHS PCT: 17 %
MCH: 31.6 pg (ref 26.0–34.0)
MCHC: 32.8 g/dL (ref 30.0–36.0)
MCV: 96.5 fL (ref 78.0–100.0)
Monocytes Absolute: 0.8 10*3/uL (ref 0.1–1.0)
Monocytes Relative: 10 %
NEUTROS ABS: 5.8 10*3/uL (ref 1.7–7.7)
Neutrophils Relative %: 72 %
PLATELETS: 399 10*3/uL (ref 150–400)
RBC: 3.95 MIL/uL — AB (ref 4.22–5.81)
RDW: 12.3 % (ref 11.5–15.5)
WBC: 8.2 10*3/uL (ref 4.0–10.5)

## 2017-02-14 LAB — MAGNESIUM: Magnesium: 1.7 mg/dL (ref 1.7–2.4)

## 2017-02-14 MED ORDER — POTASSIUM CHLORIDE CRYS ER 20 MEQ PO TBCR
40.0000 meq | EXTENDED_RELEASE_TABLET | Freq: Once | ORAL | Status: AC
  Administered 2017-02-14: 40 meq via ORAL
  Filled 2017-02-14: qty 2

## 2017-02-14 MED ORDER — MAGNESIUM SULFATE 2 GM/50ML IV SOLN
2.0000 g | Freq: Once | INTRAVENOUS | Status: AC
  Administered 2017-02-14: 2 g via INTRAVENOUS
  Filled 2017-02-14: qty 50

## 2017-02-14 NOTE — Progress Notes (Addendum)
PROGRESS NOTE  KYREL LEIGHTON WNI:[id number] DOB: [birth year] DOA: 02/11/2017 PCP: Deland Pretty, MD  Brief Summary:  H/o osa, HTN, remote esophageal cancer status post surgery in 2001, Admitted for recurrent small  bowel obstruction, dehydration  General surgery following   HPI/Recap of past 24 hours:  ng clamped, +bm ,  He reports some transient gas pain after drinking water No n/v, no fever,  urine is concentrated and dark. Denies dysuria  Assessment/Plan: Active Problems:   GERD   HTN (hypertension)   SBO (small bowel obstruction) (HCC)   Leukocytosis   OSA (obstructive sleep apnea)   Dehydration   Recurrent Small bowel obstruction -  -He was hospitalized in 10/2016 and then 01/2017 for the same, both times improved with conservative management with ng suction. -possbile related to adhesions;  -small bowel protocol started, he is feeling better, ng clamped, he is started on clears, increase ivf from 75cc to 100cc due to dark urine, still dehydarted. -will follow general surgery recommendation.  Hypokalemia/hypomagnesemia: replace k/mag,  d/c hctz (patient is prone to have dehydration, report poor oral intake and weight loss), plan to discontinue HCTZ at discharge..   Hypertension -  oral meds atenolol. hyzaar held due to npo ,  He is started on iv low pressor with holding parameters.  Resume oral meds at discharge ( except HCTZ when able to take po reliably)   OSA on CPAP.  History of aortic insufficiency and diastolic dysfunction - appears to be compensated. Question of alcohol abuse: No signs of alcohol withdrawal  H/o esophageal cancer s/p partial resection in 2001, followed by gi, last surveillance egd in 2015. Report 30pounds of weight loss last year Need to follow up with GI   H/o Gallstones - asymptomatic,lft unremarkable.  outpatient follow-up.  Malnutrition: nutrition consult  Code Status: full  Family Communication: patient    Disposition Plan: improving, hopefully can avoid surgery and can d/c home in 1-2 days, need general surgery clearance.   Consultants:  general surgery  Procedures:  Ng placement and ng suction  Antibiotics:  none   Objective: BP (!) 142/80 (BP Location: Left Arm)   Pulse 79   Temp 98.5 F (36.9 C) (Oral)   Resp 16   Ht 6' 1.5" (1.867 m)   Wt 89.8 kg (198 lb)   SpO2 100%   BMI 25.77 kg/m   Intake/Output Summary (Last 24 hours) at 02/14/17 1508 Last data filed at 02/14/17 1430  Gross per 24 hour  Intake          2588.75 ml  Output              130 ml  Net          2458.75 ml   Filed Weights   02/11/17 2051  Weight: 89.8 kg (198 lb)    Exam: Patient is examined daily including today on 02/14/2017, exams remain the same as of yesterday except that has changed    General:  NAD, looks younger than stated age  Cardiovascular: RRR  Respiratory: CTABL  Abdomen: well healed midline surgical scar, mild tender, no guarding, nondistended, +  BS  Musculoskeletal: No Edema  Neuro: alert, oriented   Data Reviewed: Basic Metabolic Panel:  Recent Labs Lab 02/11/17 2053 02/12/17 0508 02/13/17 0527 02/14/17 0553  NA 137 136 137 140  K 4.5 3.9 4.2 3.5  CL 96* 98* 98* 101  CO2 30 28 23 28   GLUCOSE 116* 131* 62* 149*  BUN 14 13 12  10  CREATININE 1.17 0.98 1.11 0.97  CALCIUM 9.9 9.3 8.6* 8.8*  MG  --  1.6* 1.8 1.7  PHOS  --  4.4  --   --    Liver Function Tests:  Recent Labs Lab 02/11/17 2053 02/12/17 0508  AST 24 22  ALT 16* 15*  ALKPHOS 69 67  BILITOT 1.1 1.0  PROT 8.4* 7.3  ALBUMIN 4.7 4.1    Recent Labs Lab 02/11/17 2053  LIPASE 20   No results for input(s): AMMONIA in the last 168 hours. CBC:  Recent Labs Lab 02/11/17 2053 02/12/17 0508 02/13/17 0527 02/14/17 0553  WBC 18.2* 13.9*  14.5* 11.2* 8.2  NEUTROABS  --  11.8* 8.5* 5.8  HGB 14.4 13.7  13.9 13.3 12.5*  HCT 42.7 40.4  40.5 40.6 38.1*  MCV 95.3 94.2  94.0 96.0 96.5   PLT 477* 371  375 423* 399   Cardiac Enzymes:   No results for input(s): CKTOTAL, CKMB, CKMBINDEX, TROPONINI in the last 168 hours. BNP (last 3 results) No results for input(s): BNP in the last 8760 hours.  ProBNP (last 3 results) No results for input(s): PROBNP in the last 8760 hours.  CBG: No results for input(s): GLUCAP in the last 168 hours.  No results found for this or any previous visit (from the past 240 hour(s)).   Studies: Dg Abd Portable 1v-small Bowel Obstruction Protocol-initial, 8 Hr Delay  Result Date: 02/13/2017 CLINICAL DATA:  Small bowel obstruction. EXAM: PORTABLE ABDOMEN - 1 VIEW COMPARISON:  02/12/2017 FINDINGS: There is persistent dilation of small bowel loops in the left abdomen. Oral contrast is seen the colon, indicative of incomplete or intermittent small bowel obstruction. IMPRESSION: Oral contrast seen within the colon, indicative of intermittent or incomplete small bowel obstruction. Electronically Signed   By: Fidela Salisbury M.D.   On: 02/13/2017 20:48    Scheduled Meds: . metoprolol tartrate  2.5 mg Intravenous Q8H  . pantoprazole (PROTONIX) IV  40 mg Intravenous Q24H  . potassium chloride  40 mEq Oral Once    Continuous Infusions: . dextrose 5 % and 0.9% NaCl 75 mL/hr at 02/14/17 1043     Time spent: 25 mins  I have personally reviewed and interpreted on  02/14/2017 daily labs,  imagings as discussed above under date review session and assessment and plans.  I reviewed all nursing notes, consultant notes,  vitals, pertinent old records  I have discussed plan of care as described above with RN , patient  on 02/14/2017   Margy Sumler MD, PhD  [facility] Pager [phone]. If 7PM-7AM, please contact night-coverage at [email or web address], password [facility] 02/14/2017, 3:08 PM  LOS: 2 days

## 2017-02-14 NOTE — Care Management Note (Signed)
Case Management Note  Patient Details  Name: Oscar [name] MRN: [id number] Date of Birth: July 11, 1932  Subjective/Objective: 83 y/o m admitted w/SBO. From home w/spouse.                   Action/Plan:d/c plan home.   Expected Discharge Date:                  Expected Discharge Plan:  Home/Self Care  In-House Referral:     Discharge planning Services  CM Consult  Post Acute Care Choice:    Choice offered to:     DME Arranged:    DME Agency:     HH Arranged:    HH Agency:     Status of Service:  In process, will continue to follow  If discussed at Long Length of Stay Meetings, dates discussed:    Additional Comments:  Dessa Phi, RN 02/14/2017, 5:48 PM

## 2017-02-14 NOTE — Progress Notes (Signed)
[facility] Progress Note      Assessment/Plan Active Problems:   GERD   HTN (hypertension)   SBO (small bowel obstruction) (HCC)   Leukocytosis   OSA (obstructive sleep apnea)   Dehydration  SBO - 3rd admission for SBO in a short timeframe   - Tolerated NGT clamping, having bowel movements but pain with ginger ale - may need exploration this admission to prevent further recurrences  - will hold off on pulling NGT. Will advance diet to clears and if he tolerates without increased abdominal pain and he continues to have flatus or BM's then will pull tube. Hesitant to pull too early as pt began having pain with fluids.    LOS: 2 days    Subjective: CC: abdominal pain  Pt states pain as a burning sensation 3/10. He states he had a BM around 0300, drank some ginger ale and began having mild abdominal pain shortly after. He is not nauseated. He has not had much to drink. No vomiting.   Objective: Vital signs in last 24 hours: Temp:  [98.4 F (36.9 C)-99.1 F (37.3 C)] 98.5 F (36.9 C) (10/09 0533) Pulse Rate:  [79-85] 79 (10/09 0533) Resp:  [16] 16 (10/09 0533) BP: (146-187)/(72-86) 187/86 (10/09 0533) SpO2:  [97 %-100 %] 100 % (10/09 0533) Last BM Date: 02/13/17  Intake/Output from previous day: 10/08 0701 - 10/09 0700 In: 1808.8 [P.O.:220; I.V.:1588.8] Out: 450 [Urine:300; Emesis/NG output:150] Intake/Output this shift: No intake/output data recorded.  PE: Gen:  Alert, NAD, pleasant, cooperative Card:  RRR, no M/G/R heard Pulm:  Rate and effort normal Abd: Soft, ND, +BS, no HSM, no guarding, mild generalized TTP worse around superior part of midline incision Skin: no rashes noted, warm and dry   Anti-infectives: Anti-infectives    None      Lab Results:   Recent Labs  02/13/17 0527 02/14/17 0553  WBC 11.2* 8.2  HGB 13.3 12.5*  HCT 40.6 38.1*  PLT 423* 399   BMET  Recent Labs  02/13/17 0527 02/14/17 0553  NA 137 140  K  4.2 3.5  CL 98* 101  CO2 23 28  GLUCOSE 62* 149*  BUN 12 10  CREATININE 1.11 0.97  CALCIUM 8.6* 8.8*   PT/INR  Recent Labs  02/11/17 2118  LABPROT 12.9  INR 0.98   CMP     Component Value Date/Time   NA 140 02/14/2017 0553   K 3.5 02/14/2017 0553   CL 101 02/14/2017 0553   CO2 28 02/14/2017 0553   GLUCOSE 149 (H) 02/14/2017 0553   BUN 10 02/14/2017 0553   CREATININE 0.97 02/14/2017 0553   CREATININE 1.13 12/07/2012 1147   CALCIUM 8.8 (L) 02/14/2017 0553   PROT 7.3 02/12/2017 0508   ALBUMIN 4.1 02/12/2017 0508   AST 22 02/12/2017 0508   ALT 15 (L) 02/12/2017 0508   ALKPHOS 67 02/12/2017 0508   BILITOT 1.0 02/12/2017 0508   GFRNONAA >60 02/14/2017 0553   GFRAA >60 02/14/2017 0553   Lipase     Component Value Date/Time   LIPASE 20 02/11/2017 2053    Studies/Results: Dg Abd Portable 1v-small Bowel Obstruction Protocol-initial, 8 Hr Delay  Result Date: 02/13/2017 CLINICAL DATA:  Small bowel obstruction. EXAM: PORTABLE ABDOMEN - 1 VIEW COMPARISON:  02/12/2017 FINDINGS: There is persistent dilation of small bowel loops in the left abdomen. Oral contrast is seen the colon, indicative of incomplete or intermittent small bowel obstruction. IMPRESSION: Oral contrast seen within the colon, indicative of intermittent  or incomplete small bowel obstruction. Electronically Signed   By: Fidela Salisbury M.D.   On: 02/13/2017 20:48      Kalman Drape , [facility] Surgery 02/14/2017, 7:52 AM Pager: [phone] Consults: [phone] Mon-Fri 7:00 am-4:30 pm Sat-Sun 7:00 am-11:30 am

## 2017-02-15 DIAGNOSIS — E440 Moderate protein-calorie malnutrition: Secondary | ICD-10-CM | POA: Insufficient documentation

## 2017-02-15 DIAGNOSIS — K56609 Unspecified intestinal obstruction, unspecified as to partial versus complete obstruction: Secondary | ICD-10-CM

## 2017-02-15 LAB — CBC WITH DIFFERENTIAL/PLATELET
BASOS ABS: 0 10*3/uL (ref 0.0–0.1)
BASOS PCT: 0 %
EOS ABS: 0.1 10*3/uL (ref 0.0–0.7)
Eosinophils Relative: 2 %
HCT: 35.6 % — ABNORMAL LOW (ref 39.0–52.0)
HEMOGLOBIN: 11.6 g/dL — AB (ref 13.0–17.0)
Lymphocytes Relative: 23 %
Lymphs Abs: 1.8 10*3/uL (ref 0.7–4.0)
MCH: 30.9 pg (ref 26.0–34.0)
MCHC: 32.6 g/dL (ref 30.0–36.0)
MCV: 94.7 fL (ref 78.0–100.0)
Monocytes Absolute: 0.7 10*3/uL (ref 0.1–1.0)
Monocytes Relative: 9 %
NEUTROS PCT: 66 %
Neutro Abs: 5.2 10*3/uL (ref 1.7–7.7)
Platelets: 370 10*3/uL (ref 150–400)
RBC: 3.76 MIL/uL — AB (ref 4.22–5.81)
RDW: 12 % (ref 11.5–15.5)
WBC: 7.8 10*3/uL (ref 4.0–10.5)

## 2017-02-15 LAB — BASIC METABOLIC PANEL
Anion gap: 9 (ref 5–15)
BUN: <5 mg/dL — ABNORMAL LOW (ref 6–20)
CHLORIDE: 103 mmol/L (ref 101–111)
CO2: 27 mmol/L (ref 22–32)
CREATININE: 0.92 mg/dL (ref 0.61–1.24)
Calcium: 8.8 mg/dL — ABNORMAL LOW (ref 8.9–10.3)
GFR calc non Af Amer: >60 mL/min (ref >60)
Glucose, Bld: 138 mg/dL — ABNORMAL HIGH (ref 65–99)
POTASSIUM: 3.6 mmol/L (ref 3.5–5.1)
SODIUM: 139 mmol/L (ref 135–145)

## 2017-02-15 LAB — LACTIC ACID, PLASMA: LACTIC ACID, VENOUS: 1.6 mmol/L (ref 0.5–1.9)

## 2017-02-15 MED ORDER — BISACODYL 10 MG RE SUPP
10.0000 mg | Freq: Once | RECTAL | Status: AC
  Administered 2017-02-15: 10 mg via RECTAL
  Filled 2017-02-15: qty 1

## 2017-02-15 NOTE — Progress Notes (Signed)
NUTRITION NOTE  Consult for diet education related to recurrent small bowel obstruction. Provided pt with handouts from the [employer] which outline low-fiber diet, appropriate low fiber foods, and a list of high fiber of foods (which RD informed him is a reminder of items that he should avoid/limit as much as possible). Pt states that his wife pushes whole grain items and "she will throw it away or not even bring it in to the house if it's not whole grain. She is all about fiber, fiber, fiber." Explained to pt that for him, high fiber/whole grain items are causing more harm than good leading to recurrent SBO and the need for hospitalization. Talked through the handouts in detail and provided pt with numerous examples. Utilized teach back method and pt is able to voice understanding of the rationale for low fiber diet and examples of appropriate and inappropriate foods. All questions answered. Pt very appreciative.  Expect good compliance.    Oscar Matin, MS, RD, LDN, [facility] Inpatient Clinical Dietitian Pager # [phone] After hours/weekend pager # [phone]

## 2017-02-15 NOTE — Care Management Important Message (Signed)
Important Message  Patient Details  Name: RONI SCOW MRN: [id number] Date of Birth: [birth year]   Medicare Important Message Given:  Yes    Kerin Salen 02/15/2017, 11:05 AMImportant Message  Patient Details  Name: MAURION WALKOWIAK MRN: [id number] Date of Birth: 1933-02-06   Medicare Important Message Given:  Yes    Kerin Salen 02/15/2017, 11:05 AM

## 2017-02-15 NOTE — Discharge Summary (Signed)
Physician Discharge Summary  Oscar [name]:[id number] DOB: 1932-06-15 DOA: 02/11/2017  PCP: Deland Pretty, MD  Admit date: 02/11/2017 Discharge date: 02/15/2017  Time spent: > 35 minutes  Recommendations for Outpatient Follow-up:  1. Monitor blood pressure 2. I encouraged ambulation prior to discharge   Discharge Diagnoses:  Active Problems:   GERD   HTN (hypertension)   SBO (small bowel obstruction) (HCC)   Leukocytosis   OSA (obstructive sleep apnea)   Dehydration   Malnutrition of moderate degree   Discharge Condition: Stable  Diet recommendation: Soft diet  Filed Weights   02/11/17 2051  Weight: 89.8 kg (198 lb)    History of present illness:  Oscar [name] is a 83 y.o. male with medical history significant of hypertension, remote esophageal cancer status post surgery in 2001, OSA    Presented with abdominal distention and nausea no vomiting similar to prior episode of small bowel obstruction as is his third episode this year both prior episodes have resolved with conservative management. Last time patient required NG tube placement and improved with that.   Hospital Course:  SBO - Reportedly third admission for SBO and a short timeframe. Improved after medical management. Tolerating soft diet prior to discharge. I recommended ambulation while at home.  Otherwise for known medical problems listed above continue prior to admission medication regimen listed below  Procedures:  None  Consultations:  General surgery: Greer Pickerel  Discharge Exam: Vitals:   02/15/17 0538 02/15/17 0633  BP: (!) 186/79 (!) 161/75  Pulse: 72   Resp: 15   Temp: 98.3 F (36.8 C)   SpO2: 100%     General: Patient in no acute distress, alert and awake Cardiovascular: Regular rate and rhythm, no murmurs or rubs Respiratory: No increased work of breathing, no wheezes  Discharge Instructions   Discharge Instructions    Call MD for:  extreme fatigue    Complete by:   As directed    Call MD for:  temperature >100.4    Complete by:  As directed    Diet - low sodium heart healthy    Complete by:  As directed    Discharge instructions    Complete by:  As directed    Follow-up with your primary care physician or general surgeon as needed after hospital discharge.   Increase activity slowly    Complete by:  As directed      Current Discharge Medication List    CONTINUE these medications which have NOT CHANGED   Details  atenolol (TENORMIN) 25 MG tablet Take 1 tablet (25 mg total) by mouth daily. Qty: 30 tablet, Refills: 11    docusate sodium (COLACE) 100 MG capsule Take 1 capsule (100 mg total) by mouth 2 (two) times daily. Qty: 10 capsule, Refills: 0    HYDROcodone-acetaminophen (NORCO/VICODIN) 5-325 MG per tablet Take 1 tablet by mouth every 6 (six) hours as needed for moderate pain.    losartan-hydrochlorothiazide (HYZAAR) 100-12.5 MG tablet Take 1 tablet by mouth daily. Refills: 4    magnesium oxide (MAG-OX) 400 (241.3 Mg) MG tablet Take 0.5 tablets (200 mg total) by mouth 2 (two) times daily. Qty: 10 tablet, Refills: 0    omeprazole (PRILOSEC) 20 MG capsule Take 40 mg by mouth 2 (two) times daily before a meal.     potassium chloride SA (K-DUR,KLOR-CON) 20 MEQ tablet Take 1 tablet (20 mEq total) by mouth 2 (two) times daily. Qty: 4 tablet, Refills: 0    senna-docusate (SENOKOT-S) 8.6-50 MG  tablet Take 1 tablet by mouth at bedtime. Qty: 30 tablet, Refills: 0    traMADol (ULTRAM) 50 MG tablet Take 50-100 mg by mouth 3 (three) times daily as needed for moderate pain or severe pain.  Refills: 1      STOP taking these medications     losartan (COZAAR) 100 MG tablet        Allergies  Allergen Reactions  . Bee Venom    Follow-up Information    Irene Shipper, MD Follow up in 1 month(s).   Specialty:  Gastroenterology Why:  for h/o esophageal cancer Contact information: [address] [place] [address] [phone]         [facility], [place]. Call.   Specialty:  General Surgery Why:  as needed, you do not need to follow up with [place] Contact information: [address] [place] [phone]           The results of significant diagnostics from this hospitalization (including imaging, microbiology, ancillary and laboratory) are listed below for reference.    Significant Diagnostic Studies: Dg Chest 2 View  Result Date: 02/12/2017 CLINICAL DATA:  Increasing abdominal pain, leukocytosis, recent bowel obstruction, history hypertension, esophageal cancer, diabetes mellitus EXAM: CHEST  2 VIEW COMPARISON:  07/30/2010 FINDINGS: Enlargement of cardiac silhouette. Atherosclerotic calcification aorta. Slight vascular congestion. Emphysematous changes with increased markings at RIGHT base versus previous exam question mild atelectasis versus infiltrate. Remaining lungs clear. No pleural effusion or pneumothorax. IMPRESSION: Enlargement of cardiac silhouette. Emphysematous changes with question atelectasis versus infiltrate at RIGHT base. Electronically Signed   By: Lavonia Dana M.D.   On: 02/12/2017 00:40   Dg Abdomen 1 View  Result Date: 02/12/2017 CLINICAL DATA:  Confirm NG placement EXAM: ABDOMEN - 1 VIEW COMPARISON:  CT abdomen and pelvis 02/11/2017 FINDINGS: The NG tube tip is at the level of the abdominal hiatus. This likely represents location within the thoracic portion of the stomach as seen on previous CT. Surgical clips in the right upper quadrant. Distended gas-filled small bowel consistent with small bowel obstruction. IMPRESSION: Enteric tube tip is at the abdominal hiatus consistent with location in the intrathoracic stomach. Electronically Signed   By: Lucienne Capers M.D.   On: 02/12/2017 01:50   Mr Lumbar Spine W/o Contrast  Result Date: 02/06/2017 CLINICAL DATA:  Chronic low back pain with left leg pain and weakness. EXAM: MRI LUMBAR SPINE WITHOUT  CONTRAST TECHNIQUE: Multiplanar, multisequence MR imaging of the lumbar spine was performed. No intravenous contrast was administered. COMPARISON:  02/02/2013 FINDINGS: Segmentation:  Standard. Alignment: Mild lumbar levoscoliosis. 6 mm anterolisthesis of L4 on L5, facet mediated and unchanged. Trace anterolisthesis of L5 on S1. Vertebrae: Chronic mild diffuse bone marrow signal heterogeneity. No fracture or destructive osseous lesion. New small L3 inferior endplate Schmorl's node with minimal surrounding edema. Conus medullaris: Extends to the T12 level and appears normal. Paraspinal and other soft tissues: 1.5 cm T2 hyperintense lesion in the interpolar left kidney, most compatible with a cyst and slightly enlarged in the interim. Disc levels: Disc desiccation throughout the lumbar spine with relative exception of L3-4. Mild disc space narrowing at L4-5. T11-12: Mild disc bulging results in mild bilateral neural foraminal stenosis without spinal stenosis. T12-L1:  Mild facet arthrosis without stenosis. L1-2: Mild disc bulging has slightly progressed and, along with mild-to-moderate facet hypertrophy results in borderline bilateral lateral recess stenosis. No spinal or neural foraminal stenosis. L2-3: Mild circumferential disc bulging and moderate  facet and ligamentum flavum hypertrophy result in borderline bilateral lateral recess and borderline bilateral neural foraminal stenosis, overall stable to minimally progressed. No spinal stenosis. L3-4: Disc bulging and moderate facet and ligamentum flavum hypertrophy result in moderate bilateral lateral recess stenosis and moderate right and mild left neural foraminal stenosis, slightly progressed from prior. Potential bilateral L4 nerve root impingement in the lateral recesses. L4-5: Anterolisthesis with bulging uncovered disc and severe facet and ligamentum flavum hypertrophy result in severe spinal stenosis, severe bilateral lateral recess stenosis, and moderate  bilateral neural foraminal stenosis, mildly progressed from prior. Bilateral L5 nerve root impingement in the lateral recesses. L5-S1: Anterolisthesis with bulging uncovered disc, foraminal endplate spurring, ligamentum flavum thickening, and severe facet arthrosis result in mild right and mild-to-moderate left lateral recess stenosis and moderate left greater than right neural foraminal stenosis, overall stable to minimally progressed. No spinal stenosis. IMPRESSION: 1. Severe multifactorial spinal stenosis at L4-5, mildly progressed from 2014. 2. Stable to minimally progressive disc and facet degeneration elsewhere as above. Electronically Signed   By: Logan Bores M.D.   On: 02/06/2017 13:45   Ct Abdomen Pelvis W Contrast  Result Date: 02/11/2017 CLINICAL DATA:  Abdominal pain and distention. Recent history of obstruction. EXAM: CT ABDOMEN AND PELVIS WITH CONTRAST TECHNIQUE: Multidetector CT imaging of the abdomen and pelvis was performed using the standard protocol following bolus administration of intravenous contrast. CONTRAST:  100 mL Isovue-300 COMPARISON:  01/29/2017 FINDINGS: Lower chest: Linear scarring in the lung bases. The stomach is mostly in the chest. This likely represents a prior esophagectomy with esophago gastrostomy. Multiple surgical clips in the upper abdomen. Hepatobiliary: Cholelithiasis with multiple small stones in the gallbladder. No gallbladder wall thickening or edema. No bile duct dilatation. No focal liver lesions. Pancreas: Unremarkable. No pancreatic ductal dilatation or surrounding inflammatory changes. Spleen: Normal in size without focal abnormality. Adrenals/Urinary Tract: No adrenal gland nodules. Small left renal cysts. Nephrograms are symmetrical. No hydronephrosis or hydroureter. Bladder wall is not thickened. Stomach/Bowel: Prominent dilated fluid and air-filled small bowel without wall thickening. The terminal ileum is decompressed with transition zone in the low mid  abdomen. This likely represents either a adhesions or an internal hernia. Similar findings are present on the previous study. The appearance is consistent with small bowel obstruction. Scattered stool in the colon. Colon is mostly decompressed. Appendix is normal. Vascular/Lymphatic: Aortic atherosclerosis. No enlarged abdominal or pelvic lymph nodes. Reproductive: Prostate gland is enlarged, measuring 6.2 cm in diameter. Other: Small amount of free fluid in the pelvis is likely reactive. No free air. Minimal periumbilical hernia containing fat. Musculoskeletal: Degenerative changes in the spine. Slight anterior subluxation at L4-5 is likely degenerative. No destructive bone lesions. IMPRESSION: 1. Small bowel obstruction with transition zone in the low mid abdomen similar to prior study. Etiology could be a adhesions or internal hernia. 2. Postoperative esophagectomy with esophago gastrostomy. 3. Cholelithiasis without evidence of cholecystitis. 4. Enlarged prostate gland. 5. Small amount of free fluid in the pelvis. 6. Aortic atherosclerosis. 7. Degenerative changes in the spine. 8. Minimal periumbilical hernia containing fat. Electronically Signed   By: Lucienne Capers M.D.   On: 02/11/2017 23:48   Ct Abdomen Pelvis W Contrast  Result Date: 01/29/2017 CLINICAL DATA:  Generalized abdominal pain for several hours, initial encounter EXAM: CT ABDOMEN AND PELVIS WITH CONTRAST TECHNIQUE: Multidetector CT imaging of the abdomen and pelvis was performed using the standard protocol following bolus administration of intravenous contrast. CONTRAST:  100 mL ISOVUE-300 IOPAMIDOL (ISOVUE-300) INJECTION 61%  COMPARISON:  10/31/2016 FINDINGS: Lower chest: Mild scarring is noted in the bases bilaterally. No focal confluent infiltrate is seen. Hepatobiliary: The liver is within normal limits. The gallbladder is well distended with multiple small dependent stones. Pancreas: Unremarkable. No pancreatic ductal dilatation or  surrounding inflammatory changes. Spleen: Normal in size without focal abnormality. Adrenals/Urinary Tract: Renal cystic changes are noted in the left kidney. No obstructive changes are seen. The bladder is partially distended. Stomach/Bowel: There are changes consistent with prior esophageal surgery with gastric pull-through. These are incompletely evaluated on this exam. Scattered diverticular change of the colon is noted. The appendix is within normal limits. Considerable small bowel dilatation is identified proximally with a transition zone in the mid abdomen just anterior to the aorta best seen on image number 41 of series 2 and image number or 46 of series 4. No definitive mass lesion is noted in this is likely related to adhesions. The more distal small bowel is within normal limits. Vascular/Lymphatic: Aortic atherosclerosis. No enlarged abdominal or pelvic lymph nodes. Reproductive: Prostate is enlarged. Other: Minimal free fluid is noted within the pelvis likely reactive in nature. No evidence of perforation is seen. No hernia is identified. Musculoskeletal: Degenerative changes of the lumbar spine are seen. IMPRESSION: Small bowel dilatation secondary to an area of transition in the mid abdomen likely related to adhesions. Cholelithiasis without complicating factors. Chronic changes as described above. Electronically Signed   By: Inez Catalina M.D.   On: 01/29/2017 14:26   Dg Abd 2 Views  Result Date: 01/30/2017 CLINICAL DATA:  Nausea and vomiting. History of small bowel obstruction. EXAM: ABDOMEN - 2 VIEW COMPARISON:  CT of 1 day prior. FINDINGS: Upright and supine films of the abdomen and pelvis. Upright views demonstrate small bowel air-fluid levels. Probable scarring in both lung bases. No gross free intraperitoneal air. Supine images demonstrate gas within the rectum. Proximal small bowel dilatation including at 5.1 cm. Similar to on the prior CT. No pneumatosis. IMPRESSION: Similar appearance of  partial small bowel obstruction. No free intraperitoneal air or other acute complication identified. Electronically Signed   By: Abigail Miyamoto M.D.   On: 01/30/2017 14:24   Dg Abd Portable 1v-small Bowel Obstruction Protocol-initial, 8 Hr Delay  Result Date: 02/13/2017 CLINICAL DATA:  Small bowel obstruction. EXAM: PORTABLE ABDOMEN - 1 VIEW COMPARISON:  02/12/2017 FINDINGS: There is persistent dilation of small bowel loops in the left abdomen. Oral contrast is seen the colon, indicative of incomplete or intermittent small bowel obstruction. IMPRESSION: Oral contrast seen within the colon, indicative of intermittent or incomplete small bowel obstruction. Electronically Signed   By: Fidela Salisbury M.D.   On: 02/13/2017 20:48   Dg Abd Portable 1v  Result Date: 02/02/2017 CLINICAL DATA:  Follow-up small bowel obstruction. EXAM: PORTABLE ABDOMEN - 1 VIEW COMPARISON:  Abdominal radiographs of November 01, 2016 FINDINGS: Previously present contrast within the colon has cleared. There are loops of mildly distended small bowel in the left upper quadrant and left mid abdomen. The colon exhibits normal caliber. There degenerative changes of the lower lumbar spine. IMPRESSION: Findings compatible with a partial mid to distal small bowel obstruction. Previously demonstrated oral contrast has cleared. Electronically Signed   By: David  Martinique M.D.   On: 02/02/2017 07:46   Dg Abd Portable 1v-small Bowel Obstruction Protocol-24 Hr Delay  Result Date: 02/01/2017 CLINICAL DATA:  24 hour follow-up examination for small-bowel obstruction EXAM: PORTABLE ABDOMEN - 1 VIEW COMPARISON:  01/31/2017 FINDINGS: Scattered large and small bowel  gas is noted. No definitive obstructive changes are seen. The previously noted contrast within the small bowel now lies entirely within the colon. The appendix is visualized as well. No acute abnormality is seen. Postsurgical changes are noted. IMPRESSION: Previously seen contrast now lies  entirely within the colon. No obstructive changes are seen. Electronically Signed   By: Inez Catalina M.D.   On: 02/01/2017 12:52   Dg Abd Portable 1v-small Bowel Obstruction Protocol-initial, 8 Hr Delay  Result Date: 01/31/2017 CLINICAL DATA:  8 hour delay, small bowel protocol EXAM: PORTABLE ABDOMEN - 1 VIEW COMPARISON:  01/30/2017, CT 01/29/2017 FINDINGS: Tiny left pleural effusion with basilar atelectasis. Esophageal to tip appears to project over the distal mediastinum, patient has history of gastric pull through. Multiple surgical clips in the upper abdomen. Persistent dilatation of central small bowel loops, measuring up to 4.4 cm with contrast present mainly within the dilated small bowel. No significant colon or rectal contrast. Calcified phleboliths in the pelvis. IMPRESSION: Similar appearance of dilated central small bowel loops, now containing contrast. No significant contrast in the colon or rectum. Findings would be consistent with small bowel obstruction. Electronically Signed   By: Donavan Foil M.D.   On: 01/31/2017 21:39   Dg [name] Dys Tube Plc W/fl W/rad  Result Date: 01/30/2017 CLINICAL DATA:  Small bowel obstruction. Prior gastric pull-through for esophageal cancer. EXAM: NASO G TUBE PLACEMENT WITH FL AND WITH RAD CONTRAST:  None FLUOROSCOPY TIME:  Fluoroscopy Time:  1.4 minutes Radiation Exposure Index (if provided by the fluoroscopic device): 5.7 mGy Number of Acquired Spot Images: 0 COMPARISON:  None. FINDINGS: The NG tube was placed under fluoroscopic guidance. The tip was placed at the level of the diaphragm with the side port in the gastric pull-through in the lower chest. IMPRESSION: NG tube placed under fluoroscopic guidance as above. Electronically Signed   By: Rolm Baptise M.D.   On: 01/30/2017 15:24    Microbiology: No results found for this or any previous visit (from the past 240 hour(s)).   Labs: Basic Metabolic Panel:  Recent Labs Lab 02/11/17 2053 02/12/17 0508  02/13/17 0527 02/14/17 0553 02/15/17 0541  NA 137 136 137 140 139  K 4.5 3.9 4.2 3.5 3.6  CL 96* 98* 98* 101 103  CO2 30 28 23 28 27   GLUCOSE 116* 131* 62* 149* 138*  BUN 14 13 12 10  <5*  CREATININE 1.17 0.98 1.11 0.97 0.92  CALCIUM 9.9 9.3 8.6* 8.8* 8.8*  MG  --  1.6* 1.8 1.7  --   PHOS  --  4.4  --   --   --    Liver Function Tests:  Recent Labs Lab 02/11/17 2053 02/12/17 0508  AST 24 22  ALT 16* 15*  ALKPHOS 69 67  BILITOT 1.1 1.0  PROT 8.4* 7.3  ALBUMIN 4.7 4.1    Recent Labs Lab 02/11/17 2053  LIPASE 20   No results for input(s): AMMONIA in the last 168 hours. CBC:  Recent Labs Lab 02/11/17 2053 02/12/17 0508 02/13/17 0527 02/14/17 0553 02/15/17 0541  WBC 18.2* 13.9*  14.5* 11.2* 8.2 7.8  NEUTROABS  --  11.8* 8.5* 5.8 5.2  HGB 14.4 13.7  13.9 13.3 12.5* 11.6*  HCT 42.7 40.4  40.5 40.6 38.1* 35.6*  MCV 95.3 94.2  94.0 96.0 96.5 94.7  PLT 477* 371  375 423* 399 370   Cardiac Enzymes: No results for input(s): CKTOTAL, CKMB, CKMBINDEX, TROPONINI in the last 168 hours. BNP: BNP (last  3 results) No results for input(s): BNP in the last 8760 hours.  ProBNP (last 3 results) No results for input(s): PROBNP in the last 8760 hours.  CBG: No results for input(s): GLUCAP in the last 168 hours.   Signed:  Velvet Bathe MD.  [facility] 02/15/2017, 12:50 PM

## 2017-02-15 NOTE — Progress Notes (Signed)
Initial Nutrition Assessment  DOCUMENTATION CODES:   Non-severe (moderate) malnutrition in context of acute illness/injury  INTERVENTION:  - Continue Soft diet. - Continue to encourage PO intakes and monitor for any signs of intolerance.   NUTRITION DIAGNOSIS:   Malnutrition (moderate/non-severe) related to acute illness (recurrent SBO) as evidenced by mild depletion of body fat, mild depletion of muscle mass, moderate depletions of muscle mass, percent weight loss.  GOAL:   Patient will meet greater than or equal to 90% of their needs  MONITOR:   PO intake, Weight trends, Labs  REASON FOR ASSESSMENT:   Malnutrition Screening Tool, Consult Assessment of nutrition requirement/status  ASSESSMENT:   83 y.o. male with medical history significant of hypertension, remote esophageal cancer status post surgery in 2001, OSA. Presented with abdominal distention and nausea no vomiting similar to prior episode of small bowel obstruction as is his third episode this year both prior episodes have resolved with conservative management. Last time patient required NG tube placement and improved with that.   Pt seen for MST and consult. BMI indicates overweight status, appropriate for advanced age. Diet advanced from FLD to Soft today at 8:15 AM and pt ordered breakfast: 2 pieces of bacon, scrambled eggs, peaches, white toast, and decaf coffee (625 kcal and 17 grams of protein). Brief interaction with pt as he was preparing to walk in the hallway. Pt tolerated FLD without issue and is looking forward to d/c (unsure of time frame for that).  Physical assessment shows mild fat wasting to upper arm, mild/moderate muscle wasting to clavicle and shoulder areas. Lower body not assessed and did not check for edema at this time. Per chart review, pt has lost 5 lbs (2.5% body weight) in the past 2 weeks; this is significant for time frame.   Medications reviewed; 2 g IV Mg sulfate x1 run yesterday, 40 mg IV  Protonix/day, 40 mEq oral KCl x1 dose yesterday.  Labs reviewed; BUN: <5 mg/dL.  IVF: D5-NS @ 100 mL/hr (408 kcal from dextrose).     Diet Order:  DIET SOFT Room service appropriate? Yes; Fluid consistency: Thin  Skin:  Reviewed, no issues  Last BM:  10/9  Height:   Ht Readings from Last 1 Encounters:  02/11/17 6' 1.5" (1.867 m)    Weight:   Wt Readings from Last 1 Encounters:  02/11/17 198 lb (89.8 kg)    Ideal Body Weight:  85 kg  BMI:  Body mass index is 25.77 kg/m.  Estimated Nutritional Needs:   Kcal:  1795-1975 (20-22 kcal/kg)  Protein:  72-90 grams (0.8-1 grams/kg)  Fluid:  >/= 1.8 L/day  EDUCATION NEEDS:   No education needs identified at this time     Jarome Matin, MS, RD, LDN, CNSC Inpatient Clinical Dietitian Pager # [phone] After hours/weekend pager # [phone]

## 2017-02-15 NOTE — Progress Notes (Signed)
[facility]/Trauma Progress Note      Assessment/Plan  Active Problems: GERD HTN (hypertension) SBO (small bowel obstruction) (HCC) Leukocytosis OSA (obstructive sleep apnea) Dehydration  SBO - 3rd admission for SBO in a short timeframe  - Tolerated full liquids and continues to have flatus  - advance to soft diet. If he tolerates soft diet he can be discharged home.   Follow up: Dr. Redmond Pulling    LOS: 3 days    Subjective:  CC: SBO  Pt having intermittent mild abdominal cramping. Having flatus and tolerating diet. Feels like he may have a BM. Wants to go home.   Objective: Vital signs in last 24 hours: Temp:  [98.3 F (36.8 C)-99 F (37.2 C)] 98.3 F (36.8 C) (10/10 0538) Pulse Rate:  [72-79] 72 (10/10 0538) Resp:  [15-16] 15 (10/10 0538) BP: (142-186)/(73-86) 161/75 (10/10 0633) SpO2:  [100 %] 100 % (10/10 0538) Last BM Date: 02/14/17  Intake/Output from previous day: 10/09 0701 - 10/10 0700 In: 2913.3 [P.O.:840; I.V.:2073.3] Out: 1090 [Urine:1090] Intake/Output this shift: No intake/output data recorded.  PE: Gen: Alert, NAD, pleasant, cooperative Card: RRR, no M/G/R heard Pulm: Rate andeffort normal Abd: Soft, ND, +BS, no HSM, no guarding, very mild generalized TTP in RUQ Skin: no rashes noted, warm and dry   Anti-infectives: Anti-infectives    None      Lab Results:   Recent Labs  02/14/17 0553 02/15/17 0541  WBC 8.2 7.8  HGB 12.5* 11.6*  HCT 38.1* 35.6*  PLT 399 370   BMET  Recent Labs  02/14/17 0553 02/15/17 0541  NA 140 139  K 3.5 3.6  CL 101 103  CO2 28 27  GLUCOSE 149* 138*  BUN 10 <5*  CREATININE 0.97 0.92  CALCIUM 8.8* 8.8*   PT/INR No results for input(s): LABPROT, INR in the last 72 hours. CMP     Component Value Date/Time   NA 139 02/15/2017 0541   K 3.6 02/15/2017 0541   CL 103 02/15/2017 0541   CO2 27 02/15/2017 0541   GLUCOSE 138 (H) 02/15/2017 0541   BUN <5 (L) 02/15/2017  0541   CREATININE 0.92 02/15/2017 0541   CREATININE 1.13 12/07/2012 1147   CALCIUM 8.8 (L) 02/15/2017 0541   PROT 7.3 02/12/2017 0508   ALBUMIN 4.1 02/12/2017 0508   AST 22 02/12/2017 0508   ALT 15 (L) 02/12/2017 0508   ALKPHOS 67 02/12/2017 0508   BILITOT 1.0 02/12/2017 0508   GFRNONAA >60 02/15/2017 0541   GFRAA >60 02/15/2017 0541   Lipase     Component Value Date/Time   LIPASE 20 02/11/2017 2053    Studies/Results: Dg Abd Portable 1v-small Bowel Obstruction Protocol-initial, 8 Hr Delay  Result Date: 02/13/2017 CLINICAL DATA:  Small bowel obstruction. EXAM: PORTABLE ABDOMEN - 1 VIEW COMPARISON:  02/12/2017 FINDINGS: There is persistent dilation of small bowel loops in the left abdomen. Oral contrast is seen the colon, indicative of incomplete or intermittent small bowel obstruction. IMPRESSION: Oral contrast seen within the colon, indicative of intermittent or incomplete small bowel obstruction. Electronically Signed   By: Fidela Salisbury M.D.   On: 02/13/2017 20:48      Kalman Drape , [facility] 02/15/2017, 8:17 AM Pager: [phone] Consults: [phone] Mon-Fri 7:00 am-4:30 pm Sat-Sun 7:00 am-11:30 am

## 2017-02-15 NOTE — Discharge Instructions (Signed)
Soft-Food Meal Plan °A soft-food meal plan includes foods that are safe and easy to swallow. This meal plan typically is used: °· If you are having trouble chewing or swallowing foods. °· As a transition meal plan after only having had liquid meals for a long period. ° °What do I need to know about the soft-food meal plan? °A soft-food meal plan includes tender foods that are soft and easy to chew and swallow. In most cases, bite-sized pieces of food are easier to swallow. A bite-sized piece is about ½ inch or smaller. Foods in this plan do not need to be ground or pureed. °Foods that are very hard, crunchy, or sticky should be avoided. Also, breads, cereals, yogurts, and desserts with nuts, seeds, or fruits should be avoided. °What foods can I eat? °Grains °Rice and wild rice. Moist bread, dressing, pasta, and noodles. Well-moistened dry or cooked cereals, such as farina (cooked wheat cereal), oatmeal, or grits. Biscuits, breads, muffins, pancakes, and waffles that have been well moistened. °Vegetables °Shredded lettuce. Cooked, tender vegetables, including potatoes without skins. Vegetable juices. Broths or creamed soups made with vegetables that are not stringy or chewy. Strained tomatoes (without seeds). °Fruits °Canned or well-cooked fruits. Soft (ripe), peeled fresh fruits, such as peaches, nectarines, kiwi, cantaloupe, honeydew melon, and watermelon (without seeds). Soft berries with small seeds, such as strawberries. Fruit juices (without pulp). °Meats and Other Protein Sources °Moist, tender, lean beef. Mutton. Lamb. Veal. Chicken. Turkey. Liver. Ham. Fish without bones. Eggs. °Dairy °Milk, milk drinks, and cream. Plain cream cheese and cottage cheese. Plain yogurt. °Sweets/Desserts °Flavored gelatin desserts. Custard. Plain ice cream, frozen yogurt, sherbet, milk shakes, and malts. Plain cakes and cookies. Plain hard candy. °Other °Butter, margarine (without trans fat), and cooking oils. Mayonnaise. Cream  sauces. Mild spices, salt, and sugar. Syrup, molasses, honey, and jelly. °The items listed above may not be a complete list of recommended foods or beverages. Contact your dietitian for more options. °What foods are not recommended? °Grains °Dry bread, toast, crackers that have not been moistened. Coarse or dry cereals, such as bran, granola, and shredded wheat. Tough or chewy crusty breads, such as French bread or baguettes. °Vegetables °Corn. Raw vegetables except shredded lettuce. Cooked vegetables that are tough or stringy. Tough, crisp, fried potatoes and potato skins. °Fruits °Fresh fruits with skins or seeds or both, such as apples, pears, or grapes. Stringy, high-pulp fruits, such as papaya, pineapple, coconut, or mango. Fruit leather, fruit roll-ups, and all dried fruits. °Meats and Other Protein Sources °Sausages and hot dogs. Meats with gristle. Fish with bones. Nuts, seeds, and chunky peanut or other nut butters. °Sweets/Desserts °Cakes or cookies that are very dry or chewy. °The items listed above may not be a complete list of foods and beverages to avoid. Contact your dietitian for more information. °This information is not intended to replace advice given to you by your health care provider. Make sure you discuss any questions you have with your health care provider. °Document Released: 08/02/2007 Document Revised: 10/01/2015 Document Reviewed: 03/22/2013 °Elsevier Interactive Patient Education © 2017 Elsevier Inc. ° °

## 2017-02-20 DIAGNOSIS — I10 Essential (primary) hypertension: Secondary | ICD-10-CM | POA: Diagnosis not present

## 2017-02-20 DIAGNOSIS — M7022 Olecranon bursitis, left elbow: Secondary | ICD-10-CM | POA: Diagnosis not present

## 2017-02-20 DIAGNOSIS — Z7982 Long term (current) use of aspirin: Secondary | ICD-10-CM | POA: Diagnosis not present

## 2017-02-21 DIAGNOSIS — M1990 Unspecified osteoarthritis, unspecified site: Secondary | ICD-10-CM | POA: Diagnosis not present

## 2017-02-21 DIAGNOSIS — M10022 Idiopathic gout, left elbow: Secondary | ICD-10-CM | POA: Diagnosis not present

## 2017-02-21 DIAGNOSIS — M7022 Olecranon bursitis, left elbow: Secondary | ICD-10-CM | POA: Diagnosis not present

## 2017-02-21 DIAGNOSIS — M25529 Pain in unspecified elbow: Secondary | ICD-10-CM | POA: Diagnosis not present

## 2017-02-21 DIAGNOSIS — S52025A Nondisplaced fracture of olecranon process without intraarticular extension of left ulna, initial encounter for closed fracture: Secondary | ICD-10-CM | POA: Diagnosis not present

## 2017-02-24 ENCOUNTER — Emergency Department (HOSPITAL_BASED_OUTPATIENT_CLINIC_OR_DEPARTMENT_OTHER)
Admission: EM | Admit: 2017-02-24 | Discharge: 2017-02-24 | Disposition: A | Discharge status: Home / Self Care | Payer: Medicare Other | Attending: Emergency Medicine | Admitting: Emergency Medicine

## 2017-02-24 ENCOUNTER — Telehealth: Payer: Self-pay | Admitting: Internal Medicine

## 2017-02-24 ENCOUNTER — Encounter (HOSPITAL_BASED_OUTPATIENT_CLINIC_OR_DEPARTMENT_OTHER): Payer: Self-pay

## 2017-02-24 ENCOUNTER — Emergency Department (HOSPITAL_BASED_OUTPATIENT_CLINIC_OR_DEPARTMENT_OTHER): Payer: Medicare Other

## 2017-02-24 DIAGNOSIS — I10 Essential (primary) hypertension: Secondary | ICD-10-CM | POA: Diagnosis not present

## 2017-02-24 DIAGNOSIS — F17290 Nicotine dependence, other tobacco product, uncomplicated: Secondary | ICD-10-CM | POA: Diagnosis not present

## 2017-02-24 DIAGNOSIS — R1084 Generalized abdominal pain: Secondary | ICD-10-CM | POA: Diagnosis not present

## 2017-02-24 DIAGNOSIS — Z79899 Other long term (current) drug therapy: Secondary | ICD-10-CM | POA: Diagnosis not present

## 2017-02-24 DIAGNOSIS — E119 Type 2 diabetes mellitus without complications: Secondary | ICD-10-CM | POA: Insufficient documentation

## 2017-02-24 DIAGNOSIS — R109 Unspecified abdominal pain: Secondary | ICD-10-CM | POA: Diagnosis not present

## 2017-02-24 DIAGNOSIS — Z8501 Personal history of malignant neoplasm of esophagus: Secondary | ICD-10-CM | POA: Insufficient documentation

## 2017-02-24 LAB — CBC WITH DIFFERENTIAL/PLATELET
BASOS PCT: 0 %
Basophils Absolute: 0 10*3/uL (ref 0.0–0.1)
EOS ABS: 0.1 10*3/uL (ref 0.0–0.7)
EOS PCT: 1 %
HCT: 36 % — ABNORMAL LOW (ref 39.0–52.0)
Hemoglobin: 11.9 g/dL — ABNORMAL LOW (ref 13.0–17.0)
Lymphocytes Relative: 17 %
Lymphs Abs: 1.7 10*3/uL (ref 0.7–4.0)
MCH: 31.6 pg (ref 26.0–34.0)
MCHC: 33.1 g/dL (ref 30.0–36.0)
MCV: 95.5 fL (ref 78.0–100.0)
MONO ABS: 0.7 10*3/uL (ref 0.1–1.0)
Monocytes Relative: 8 %
Neutro Abs: 7.1 10*3/uL (ref 1.7–7.7)
Neutrophils Relative %: 74 %
PLATELETS: 354 10*3/uL (ref 150–400)
RBC: 3.77 MIL/uL — ABNORMAL LOW (ref 4.22–5.81)
RDW: 11.7 % (ref 11.5–15.5)
WBC: 9.7 10*3/uL (ref 4.0–10.5)

## 2017-02-24 LAB — COMPREHENSIVE METABOLIC PANEL
ALBUMIN: 4.2 g/dL (ref 3.5–5.0)
ALT: 10 U/L — ABNORMAL LOW (ref 17–63)
ANION GAP: 8 (ref 5–15)
AST: 17 U/L (ref 15–41)
Alkaline Phosphatase: 64 U/L (ref 38–126)
BUN: 10 mg/dL (ref 6–20)
CO2: 27 mmol/L (ref 22–32)
Calcium: 9 mg/dL (ref 8.9–10.3)
Chloride: 99 mmol/L — ABNORMAL LOW (ref 101–111)
Creatinine, Ser: 0.83 mg/dL (ref 0.61–1.24)
GFR calc non Af Amer: >60 mL/min (ref >60)
Glucose, Bld: 98 mg/dL (ref 65–99)
POTASSIUM: 3.8 mmol/L (ref 3.5–5.1)
SODIUM: 134 mmol/L — AB (ref 135–145)
Total Bilirubin: 0.5 mg/dL (ref 0.3–1.2)
Total Protein: 7.2 g/dL (ref 6.5–8.1)

## 2017-02-24 LAB — LIPASE, BLOOD: Lipase: 19 U/L (ref 11–51)

## 2017-02-24 MED ORDER — SODIUM CHLORIDE 0.9 % IV BOLUS (SEPSIS)
1000.0000 mL | Freq: Once | INTRAVENOUS | Status: AC
  Administered 2017-02-24: 1000 mL via INTRAVENOUS

## 2017-02-24 MED ORDER — ONDANSETRON HCL 4 MG/2ML IJ SOLN
4.0000 mg | Freq: Once | INTRAMUSCULAR | Status: AC
  Administered 2017-02-24: 4 mg via INTRAVENOUS
  Filled 2017-02-24: qty 2

## 2017-02-24 MED ORDER — IOPAMIDOL (ISOVUE-300) INJECTION 61%
100.0000 mL | Freq: Once | INTRAVENOUS | Status: AC | PRN
  Administered 2017-02-24: 100 mL via INTRAVENOUS

## 2017-02-24 MED ORDER — MORPHINE SULFATE (PF) 4 MG/ML IV SOLN
4.0000 mg | Freq: Once | INTRAVENOUS | Status: AC
  Administered 2017-02-24: 4 mg via INTRAVENOUS
  Filled 2017-02-24: qty 1

## 2017-02-24 MED ORDER — SIMETHICONE 40 MG/0.6ML PO SUSP: 40.0000 mg | Freq: Four times a day (QID) | ORAL | 0 refills | Status: DC | PRN

## 2017-02-24 NOTE — Telephone Encounter (Signed)
Spoke with Mr Cassetta. Recent hospitalization for SBO.  He was told to follow up with Dr Henrene Pastor in 1 month. Discharged 02/11/17. Patient is able to eat and retain without nausea and vomiting. He states his abdomen is still "sore" and feels a little bloated. He is passing stool and gas. He is trying to maintain adequate hydration. He reports he had scrambled eggs and grits for breakfast. He is "up , moving around and working." Reviewed reasons and red flags to go to the ER. He states he knows "because I have been through it." Appointment moved to Wednesday 03/01/17 with an extender. History of Barrett's esophagus. History of esophageal cancer. Hospitalist had recommended follow up.

## 2017-02-24 NOTE — ED Provider Notes (Signed)
[facility]GENC[facility] DEPART[facility] Provider Note   CSN: [id number] Arrival date & time: 02/24/17  1339     History   Chief Complaint Chief Complaint  Patient presents with  . Abdominal Pain    HPI KALLUM JORGENSEN is a 83 y.o. male.  The history is provided by the patient.  Abdominal Pain   This is a new problem. The current episode started 6 to 12 hours ago. The problem occurs hourly. The problem has been gradually improving. The pain is associated with an unknown factor. The pain is located in the generalized abdominal region. The quality of the pain is aching and sharp. The pain is mild. Associated symptoms include nausea. Pertinent negatives include fever, hematochezia, melena, vomiting, dysuria and frequency. Nothing aggravates the symptoms. Nothing relieves the symptoms.   83 year old male who presents with generalized abdominal pain. He has a history of gastric resection complicated by recurrent SBO's, hypertension, diabetes, GERD.recently hospitalized this month for small bowel obstruction. Reports that he had sudden onset of severe upper abdominal pain with bloating this morning. Feel similar to previous bowel obstructions. Denies any vomiting but does feel nauseated. He had a small bowel movement this morning.Denies any fevers, dysuria or urinary frequency, chest pain or difficulty breathing, melena or hematochezia.  Past Medical History:  Diagnosis Date  . Acid reflux   . Barrett's esophagus   . BPH (benign prostatic hyperplasia)   . Colon polyps   . DDD (degenerative disc disease)   . Diabetes mellitus    controlled with diet and exercise  . ED (erectile dysfunction)   . Esophageal cancer ([place])    tx'd surgery - abdominal & right posterior chest approach (2001) - [place], [place]  . First degree AV block   . Gout   . Hemorrhoids   . History of palpitations   . Hyperlipidemia   . Hypertension   . Lipoma   . Obesity   . Osteoarthritis   . Tinea versicolor    . Tinnitus     Patient Active Problem List   Diagnosis Date Noted  . Malnutrition of moderate degree 02/15/2017  . Leukocytosis 02/12/2017  . OSA (obstructive sleep apnea) 02/12/2017  . Dehydration 02/12/2017  . Partial small bowel obstruction ([place]) 01/29/2017  . SBO (small bowel obstruction) ([place]) 10/31/2016  . Aortic insufficiency 07/09/2013  . HTN (hypertension) 07/09/2013  . PERSONAL HISTORY MALIGNANT NEOPLASM ESOPHAGUS 07/17/2009  . GERD 07/13/2009  . BARRETT'S ESOPHAGUS 07/13/2009  . DYSPHAGIA 07/13/2009  . FLATULENCE-GAS-BLOATING 07/13/2009    Past Surgical History:  Procedure Laterality Date  . bilateral olecranon bursal excisions    . Esophageal Cancer Surgery  2000, 2001  . GASTRIC RESECTION    . HEMORRHOID SURGERY    . NM MYOCAR PERF WALL MOTION  2011   dipyridamole myoview - stress images show medium in size, moderate in intensity perfusion defect in basal inferior & mid inferior walls with mild defect reversibility at rest, EF 64%  . TRANSTHORACIC ECHOCARDIOGRAM  2011   EF=>55%, mild mitral annular calcif, mild calcif of MV apparatus, mild TR, normal RSVP, mild AV regurg, aortic root sclerosis/calcifiication  . VASECTOMY         Home Medications    Prior to Admission medications   Medication Sig Start Date End Date Taking? Authorizing Provider  atenolol (TENORMIN) 25 MG tablet Take 1 tablet (25 mg total) by mouth daily. 07/09/13  Yes Hilty, Nadean Corwin, MD  docusate sodium (COLACE) 100 MG capsule Take 1 capsule (  100 mg total) by mouth 2 (two) times daily. 02/03/17  Yes Regalado, Belkys A, MD  losartan-hydrochlorothiazide (HYZAAR) 100-12.5 MG tablet Take 1 tablet by mouth daily. 02/02/17  Yes [provider]  omeprazole (PRILOSEC) 20 MG capsule Take 40 mg by mouth 2 (two) times daily before a meal.    Yes [provider]  senna-docusate (SENOKOT-S) 8.6-50 MG tablet Take 1 tablet by mouth at bedtime. 11/02/16  Yes Florencia Reasons, MD    HYDROcodone-acetaminophen (NORCO/VICODIN) 5-325 MG per tablet Take 1 tablet by mouth every 6 (six) hours as needed for moderate pain.    [provider]  magnesium oxide (MAG-OX) 400 (241.3 Mg) MG tablet Take 0.5 tablets (200 mg total) by mouth 2 (two) times daily. 02/03/17   Regalado, Belkys A, MD  potassium chloride SA (K-DUR,KLOR-CON) 20 MEQ tablet Take 1 tablet (20 mEq total) by mouth 2 (two) times daily. 02/03/17   Regalado, Belkys A, MD  traMADol (ULTRAM) 50 MG tablet Take 50-100 mg by mouth 3 (three) times daily as needed for moderate pain or severe pain.  10/06/16   [provider]    Family History Family History  Problem Relation Age of Onset  . Diabetes Mother   . CAD Mother   . Stroke Sister   . Colon cancer Neg Hx   . Esophageal cancer Neg Hx   . Rectal cancer Neg Hx   . Stomach cancer Neg Hx     Social History Social History  Substance Use Topics  . Smoking status: Current Some Day Smoker    Years: 50.00    Types: Cigars, Pipe  . Smokeless tobacco: Never Used     Comment: 2-3x/daily, sometimes not at all , tobacco info given 08/01/13  . Alcohol use 3.5 - 7.0 oz/week    7 - 14 Standard drinks or equivalent per week     Allergies   Bee venom   Review of Systems Review of Systems  Constitutional: Negative for fever.  Gastrointestinal: Positive for abdominal pain and nausea. Negative for hematochezia, melena and vomiting.  Genitourinary: Negative for dysuria and frequency.  All other systems reviewed and are negative.    Physical Exam Updated Vital Signs BP (!) 166/81 (BP Location: Right Arm)   Pulse 73   Temp 97.7 F (36.5 C) (Oral)   Resp 18   Ht 6\' 2"  (1.88 m)   Wt 90.3 kg (199 lb)   SpO2 97%   BMI 25.55 kg/m   Physical Exam Physical Exam  Nursing note and vitals reviewed. Constitutional: Well developed, well nourished, non-toxic, and in no acute distress Head: Normocephalic and atraumatic.  Mouth/Throat: Oropharynx is clear  and moist.  Neck: Normal range of motion. Neck supple.  Cardiovascular: Normal rate and regular rhythm.   Pulmonary/Chest: Effort normal and breath sounds normal.  Abdominal: Soft. There is minimal distension. No significant tenderness. There is no rebound and no guarding.  Musculoskeletal: Normal range of motion.  Neurological: Alert, no facial droop, fluent speech, moves all extremities symmetrically Skin: Skin is warm and dry.  Psychiatric: Cooperative   ED Treatments / Results  Labs (all labs ordered are listed, but only abnormal results are displayed) Labs Reviewed  CBC WITH DIFFERENTIAL/PLATELET - Abnormal; Notable for the following:       Result Value   RBC 3.77 (*)    Hemoglobin 11.9 (*)    HCT 36.0 (*)    All other components within normal limits  COMPREHENSIVE METABOLIC PANEL - Abnormal; Notable for  the following:    Sodium 134 (*)    Chloride 99 (*)    ALT 10 (*)    All other components within normal limits  LIPASE, BLOOD    EKG  EKG Interpretation None       Radiology Ct Abdomen Pelvis W Contrast  Result Date: 02/24/2017 CLINICAL DATA:  Abdominal pain. History of bowel obstruction, most recently 1 week ago. EXAM: CT ABDOMEN AND PELVIS WITH CONTRAST TECHNIQUE: Multidetector CT imaging of the abdomen and pelvis was performed using the standard protocol following bolus administration of intravenous contrast. CONTRAST:  138mL ISOVUE-300 IOPAMIDOL (ISOVUE-300) INJECTION 61% COMPARISON:  CT abdomen pelvis dated February 11, 2017. FINDINGS: Lower chest: No acute abnormality. Scarring at the lung bases. Prior esophagectomy and gastric pull-up. Hepatobiliary: No focal liver abnormality. Cholelithiasis with multiple small layering stones in the gallbladder. No gallbladder wall thickening or biliary dilatation. Pancreas: Unremarkable. No pancreatic ductal dilatation or surrounding inflammatory changes. Spleen: Normal in size without focal abnormality. Adrenals/Urinary Tract:  The adrenal glands are unremarkable. Small left renal cysts are unchanged. No hydronephrosis or renal calculi. The bladder is unremarkable. Stomach/Bowel: There are a few dilated, fluid and air-filled loops of small bowel with gradual transition to nondilated small bowel in the lower mid abdomen (series 2, image 39). No wall thickening. Degree of small bowel dilatation is less severe than that on prior CT. Colonic diverticulosis.  Normal appendix. Vascular/Lymphatic: Aortic atherosclerosis. No enlarged abdominal or pelvic lymph nodes. Reproductive: Unchanged prostatomegaly. Other: Trace free fluid in the pelvis.  No free air. Musculoskeletal: Degenerative changes of the thoracolumbar spine. No acute or significant osseous findings. IMPRESSION: 1. Several dilated fluid and air-filled loops of small bowel with gradual transition to nondilated small bowel in the lower mid abdomen, concerning for early small bowel obstruction. Degree of obstruction and small bowel dilatation is less severe when compared to prior CT. 2. Cholelithiasis. 3.  Aortic atherosclerosis (ICD10-I70.0). Electronically Signed   By: Titus Dubin M.D.   On: 02/24/2017 16:00    Procedures Procedures (including critical care time)  Medications Ordered in ED Medications  sodium chloride 0.9 % bolus 1,000 mL (0 mLs Intravenous Stopped 02/24/17 1638)  morphine 4 MG/ML injection 4 mg (4 mg Intravenous Given 02/24/17 1437)  ondansetron (ZOFRAN) injection 4 mg (4 mg Intravenous Given 02/24/17 1438)  iopamidol (ISOVUE-300) 61 % injection 100 mL (100 mLs Intravenous Contrast Given 02/24/17 1524)     Initial Impression / Assessment and Plan / ED Course  I have reviewed the triage vital signs and the nursing notes.  Pertinent labs & imaging results that were available during my care of the patient were reviewed by me and considered in my medical decision making (see chart for details).     With history of recurrent SBO due to prior  gastric resection. Had a very loose BM while in ED, and states he feels significantly better. Abdomen soft and not tender. A CT scan was performed, showing signs of potential early bowel obstruction. Discussed with radiology (Dr. Alease Frame), and given that he clinically feels better this could also be showing resolving bowel obstruction. He continues to have a soft and benign abdomen. He is tolerating fluids and crackers without any difficulty. He would like to go home and avoid admission if possible.  Given clinically he appears to have resolving obstruction, he will be discharged. Discussed strict return instructions for any recurrent symptoms of abdominal pain, vomiting or any other symptoms concerning to him. Strict return and follow-up instructions  reviewed. He expressed understanding of all discharge instructions and felt comfortable with the plan of care.   Final Clinical Impressions(s) / ED Diagnoses   Final diagnoses:  Generalized abdominal pain    New Prescriptions New Prescriptions   No medications on file     Forde Dandy, MD 02/24/17 1744

## 2017-02-24 NOTE — ED Notes (Signed)
Pt provided with graham crackers and peanut butter per verbal order of MD.

## 2017-02-24 NOTE — ED Notes (Signed)
Pt reports he tolerated the peanut butter, graham crackers and water. Pt reports no issues.

## 2017-02-24 NOTE — Discharge Instructions (Signed)
You had a bowel obstruction that appears to be resolving. Since you feel better, and you are eating drinking without difficulty you are being discharged.  However, you must return to ED if return of severe abdominal pain, nausea/vomiting, fever, or any other symptoms concerning to you.

## 2017-02-24 NOTE — ED Triage Notes (Signed)
Pt arrives EMS with complaints of generalized abdominal pain. Pt reports the pain started yesterday around 12pm. Pt reports he has a history of bowel obstructions. Pain is intermittent.

## 2017-03-01 ENCOUNTER — Ambulatory Visit: Payer: Medicare Other | Admitting: Gastroenterology

## 2017-03-01 DIAGNOSIS — G4733 Obstructive sleep apnea (adult) (pediatric): Secondary | ICD-10-CM | POA: Diagnosis not present

## 2017-03-03 ENCOUNTER — Ambulatory Visit (INDEPENDENT_AMBULATORY_CARE_PROVIDER_SITE_OTHER): Payer: Medicare Other | Admitting: Orthopedic Surgery

## 2017-03-03 ENCOUNTER — Ambulatory Visit: Payer: Medicare Other | Admitting: Gastroenterology

## 2017-03-03 ENCOUNTER — Encounter (INDEPENDENT_AMBULATORY_CARE_PROVIDER_SITE_OTHER): Payer: Self-pay | Admitting: Orthopedic Surgery

## 2017-03-03 DIAGNOSIS — G8929 Other chronic pain: Secondary | ICD-10-CM | POA: Diagnosis not present

## 2017-03-03 DIAGNOSIS — M5441 Lumbago with sciatica, right side: Secondary | ICD-10-CM | POA: Diagnosis not present

## 2017-03-03 DIAGNOSIS — M5442 Lumbago with sciatica, left side: Secondary | ICD-10-CM

## 2017-03-03 DIAGNOSIS — M25522 Pain in left elbow: Secondary | ICD-10-CM

## 2017-03-03 NOTE — Progress Notes (Signed)
Office Visit Note   Patient: Oscar [name]           Date of Birth: [birth year]           MRN: [id number] Visit Date: 03/03/2017 Requested by: Deland Pretty, MD [address] [place], [place] [address] PCP: Deland Pretty, MD  Subjective: Chief Complaint  Patient presents with  . Left Elbow - Fracture    HPI: Patient is a young 83 year old with left elbow symptoms that he is referred here to evaluate.  He denies any pain in the left elbow.  He was able to do push-ups yesterday without pain or symptoms.  He has had osteophyte removal on both elbows in the past.  Was seen by rheumatology yesterday and they referred him here because of radiographic abnormality.  Patient also has had MRI scan on his back.  He is scheduled for epidural steroid injections.  MRI scan is reviewed with the patient and he has severe facet arthritis at L4-5 and he also has severe spinal stenosis at L4-5.  He has walking endurance of 1 mile however.  No current back symptoms.              ROS: All systems reviewed are negative as they relate to the chief complaint within the history of present illness.  Patient denies  fevers or chills.   Assessment & Plan: Visit Diagnoses: No diagnosis found.  Plan: Impression is left elbow radiographic abnormality with no functional deficiency or pain.  His triceps function is excellent.  Range of motion is full.  No intervention required on the left elbow.  In regards to the back this is a difficult situation.  Currently he is asymptomatic and I do not think facet injections indicated at this time based on his severe spinal stenosis.  Plan is observation for now but if he has any back symptoms I would refer him for surgical evaluation.  I will see him back as needed  Follow-Up Instructions: Return if symptoms worsen or fail to improve.   Orders:  No orders of the defined types were placed in this encounter.  No orders of the defined types were placed in this  encounter.     Procedures: No procedures performed   Clinical Data: No additional findings.  Objective: Vital Signs: There were no vitals taken for this visit.  Physical Exam:   Constitutional: Patient appears well-developed HEENT:  Head: Normocephalic Eyes:EOM are normal Neck: Normal range of motion Cardiovascular: Normal rate Pulmonary/chest: Effort normal Neurologic: Patient is alert Skin: Skin is warm Psychiatric: Patient has normal mood and affect    Ortho Exam: Orthopedic exam demonstrates normal gait and alignment full active and passive range of motion of the left elbow pronation supination flexion extension.  Excellent triceps strength.  Intact motor sensory function to the hand.  Not much in the way of pain with forward or lateral bending and he has good quad hamstring ankle dorsiflexion plantar flexion strength without paresthesias in either leg.  Specialty Comments:  No specialty comments available.  Imaging: No results found.   PMFS History: Patient Active Problem List   Diagnosis Date Noted  . Malnutrition of moderate degree 02/15/2017  . Leukocytosis 02/12/2017  . OSA (obstructive sleep apnea) 02/12/2017  . Dehydration 02/12/2017  . Partial small bowel obstruction ([place]) 01/29/2017  . SBO (small bowel obstruction) ([place]) 10/31/2016  . Aortic insufficiency 07/09/2013  . HTN (hypertension) 07/09/2013  . PERSONAL HISTORY MALIGNANT NEOPLASM ESOPHAGUS 07/17/2009  . GERD  07/13/2009  . BARRETT'S ESOPHAGUS 07/13/2009  . DYSPHAGIA 07/13/2009  . FLATULENCE-GAS-BLOATING 07/13/2009   Past Medical History:  Diagnosis Date  . Acid reflux   . Barrett's esophagus   . BPH (benign prostatic hyperplasia)   . Colon polyps   . DDD (degenerative disc disease)   . Diabetes mellitus    controlled with diet and exercise  . ED (erectile dysfunction)   . Esophageal cancer ([place])    tx'd surgery - abdominal & right posterior chest approach (2001) - [place], [place]  .  First degree AV block   . Gout   . Hemorrhoids   . History of palpitations   . Hyperlipidemia   . Hypertension   . Lipoma   . Obesity   . Osteoarthritis   . Tinea versicolor   . Tinnitus     Family History  Problem Relation Age of Onset  . Diabetes Mother   . CAD Mother   . Stroke Sister   . Colon cancer Neg Hx   . Esophageal cancer Neg Hx   . Rectal cancer Neg Hx   . Stomach cancer Neg Hx     Past Surgical History:  Procedure Laterality Date  . bilateral olecranon bursal excisions    . Esophageal Cancer Surgery  2000, 2001  . GASTRIC RESECTION    . HEMORRHOID SURGERY    . NM MYOCAR PERF WALL MOTION  2011   dipyridamole myoview - stress images show medium in size, moderate in intensity perfusion defect in basal inferior & mid inferior walls with mild defect reversibility at rest, EF 64%  . TRANSTHORACIC ECHOCARDIOGRAM  2011   EF=>55%, mild mitral annular calcif, mild calcif of MV apparatus, mild TR, normal RSVP, mild AV regurg, aortic root sclerosis/calcifiication  . VASECTOMY     Social History   Occupational History  . self employed    Social History Main Topics  . Smoking status: Current Some Day Smoker    Years: 50.00    Types: Cigars, Pipe  . Smokeless tobacco: Never Used     Comment: 2-3x/daily, sometimes not at all , tobacco info given 08/01/13  . Alcohol use 3.5 - 7.0 oz/week    7 - 14 Standard drinks or equivalent per week  . Drug use: No  . Sexual activity: Not on file

## 2017-03-06 DIAGNOSIS — G4733 Obstructive sleep apnea (adult) (pediatric): Secondary | ICD-10-CM | POA: Diagnosis not present

## 2017-04-01 DIAGNOSIS — G4733 Obstructive sleep apnea (adult) (pediatric): Secondary | ICD-10-CM | POA: Diagnosis not present

## 2017-04-27 ENCOUNTER — Encounter (INDEPENDENT_AMBULATORY_CARE_PROVIDER_SITE_OTHER): Payer: Self-pay

## 2017-04-27 ENCOUNTER — Ambulatory Visit (INDEPENDENT_AMBULATORY_CARE_PROVIDER_SITE_OTHER): Payer: Medicare Other | Admitting: Surgery

## 2017-04-27 DIAGNOSIS — Z5321 Procedure and treatment not carried out due to patient leaving prior to being seen by health care provider: Secondary | ICD-10-CM

## 2017-05-01 DIAGNOSIS — G4733 Obstructive sleep apnea (adult) (pediatric): Secondary | ICD-10-CM | POA: Diagnosis not present

## 2017-06-01 DIAGNOSIS — G4733 Obstructive sleep apnea (adult) (pediatric): Secondary | ICD-10-CM | POA: Diagnosis not present

## 2017-06-15 DIAGNOSIS — M5136 Other intervertebral disc degeneration, lumbar region: Secondary | ICD-10-CM | POA: Diagnosis not present

## 2017-06-15 DIAGNOSIS — M9905 Segmental and somatic dysfunction of pelvic region: Secondary | ICD-10-CM | POA: Diagnosis not present

## 2017-06-15 DIAGNOSIS — M9903 Segmental and somatic dysfunction of lumbar region: Secondary | ICD-10-CM | POA: Diagnosis not present

## 2017-06-15 DIAGNOSIS — M9904 Segmental and somatic dysfunction of sacral region: Secondary | ICD-10-CM | POA: Diagnosis not present

## 2017-06-16 DIAGNOSIS — M9905 Segmental and somatic dysfunction of pelvic region: Secondary | ICD-10-CM | POA: Diagnosis not present

## 2017-06-16 DIAGNOSIS — M5136 Other intervertebral disc degeneration, lumbar region: Secondary | ICD-10-CM | POA: Diagnosis not present

## 2017-06-16 DIAGNOSIS — M9903 Segmental and somatic dysfunction of lumbar region: Secondary | ICD-10-CM | POA: Diagnosis not present

## 2017-06-16 DIAGNOSIS — M9904 Segmental and somatic dysfunction of sacral region: Secondary | ICD-10-CM | POA: Diagnosis not present

## 2017-07-02 DIAGNOSIS — G4733 Obstructive sleep apnea (adult) (pediatric): Secondary | ICD-10-CM | POA: Diagnosis not present

## 2017-07-03 DIAGNOSIS — M9905 Segmental and somatic dysfunction of pelvic region: Secondary | ICD-10-CM | POA: Diagnosis not present

## 2017-07-03 DIAGNOSIS — M9903 Segmental and somatic dysfunction of lumbar region: Secondary | ICD-10-CM | POA: Diagnosis not present

## 2017-07-03 DIAGNOSIS — M9904 Segmental and somatic dysfunction of sacral region: Secondary | ICD-10-CM | POA: Diagnosis not present

## 2017-07-03 DIAGNOSIS — M5136 Other intervertebral disc degeneration, lumbar region: Secondary | ICD-10-CM | POA: Diagnosis not present

## 2017-07-14 DIAGNOSIS — E119 Type 2 diabetes mellitus without complications: Secondary | ICD-10-CM | POA: Diagnosis not present

## 2017-07-28 DIAGNOSIS — I10 Essential (primary) hypertension: Secondary | ICD-10-CM | POA: Diagnosis not present

## 2017-07-28 DIAGNOSIS — E7800 Pure hypercholesterolemia, unspecified: Secondary | ICD-10-CM | POA: Diagnosis not present

## 2017-07-28 DIAGNOSIS — E119 Type 2 diabetes mellitus without complications: Secondary | ICD-10-CM | POA: Diagnosis not present

## 2017-07-30 DIAGNOSIS — G4733 Obstructive sleep apnea (adult) (pediatric): Secondary | ICD-10-CM | POA: Diagnosis not present

## 2017-08-02 DIAGNOSIS — I10 Essential (primary) hypertension: Secondary | ICD-10-CM | POA: Diagnosis not present

## 2017-08-02 DIAGNOSIS — E119 Type 2 diabetes mellitus without complications: Secondary | ICD-10-CM | POA: Diagnosis not present

## 2017-08-02 DIAGNOSIS — Z0001 Encounter for general adult medical examination with abnormal findings: Secondary | ICD-10-CM | POA: Diagnosis not present

## 2017-08-02 DIAGNOSIS — Z8501 Personal history of malignant neoplasm of esophagus: Secondary | ICD-10-CM | POA: Diagnosis not present

## 2017-08-02 DIAGNOSIS — I440 Atrioventricular block, first degree: Secondary | ICD-10-CM | POA: Diagnosis not present

## 2017-08-10 DIAGNOSIS — M25522 Pain in left elbow: Secondary | ICD-10-CM | POA: Diagnosis not present

## 2017-08-16 DIAGNOSIS — M9904 Segmental and somatic dysfunction of sacral region: Secondary | ICD-10-CM | POA: Diagnosis not present

## 2017-08-16 DIAGNOSIS — M5136 Other intervertebral disc degeneration, lumbar region: Secondary | ICD-10-CM | POA: Diagnosis not present

## 2017-08-16 DIAGNOSIS — M5134 Other intervertebral disc degeneration, thoracic region: Secondary | ICD-10-CM | POA: Diagnosis not present

## 2017-08-16 DIAGNOSIS — M9905 Segmental and somatic dysfunction of pelvic region: Secondary | ICD-10-CM | POA: Diagnosis not present

## 2017-08-16 DIAGNOSIS — M9903 Segmental and somatic dysfunction of lumbar region: Secondary | ICD-10-CM | POA: Diagnosis not present

## 2017-08-30 DIAGNOSIS — G4733 Obstructive sleep apnea (adult) (pediatric): Secondary | ICD-10-CM | POA: Diagnosis not present

## 2017-09-06 DIAGNOSIS — R319 Hematuria, unspecified: Secondary | ICD-10-CM | POA: Diagnosis not present

## 2017-09-25 DIAGNOSIS — M79645 Pain in left finger(s): Secondary | ICD-10-CM | POA: Diagnosis not present

## 2017-09-27 ENCOUNTER — Ambulatory Visit: Payer: Medicare Other | Admitting: Internal Medicine

## 2017-09-27 ENCOUNTER — Encounter: Payer: Self-pay | Admitting: Internal Medicine

## 2017-09-27 VITALS — BP 146/62 | HR 55 | Ht 73.0 in | Wt 193.0 lb

## 2017-09-27 DIAGNOSIS — K2270 Barrett's esophagus without dysplasia: Secondary | ICD-10-CM | POA: Diagnosis not present

## 2017-09-27 DIAGNOSIS — R634 Abnormal weight loss: Secondary | ICD-10-CM

## 2017-09-27 DIAGNOSIS — K5901 Slow transit constipation: Secondary | ICD-10-CM

## 2017-09-27 DIAGNOSIS — K56600 Partial intestinal obstruction, unspecified as to cause: Secondary | ICD-10-CM

## 2017-09-27 DIAGNOSIS — K089 Disorder of teeth and supporting structures, unspecified: Secondary | ICD-10-CM | POA: Diagnosis not present

## 2017-09-27 DIAGNOSIS — K219 Gastro-esophageal reflux disease without esophagitis: Secondary | ICD-10-CM | POA: Diagnosis not present

## 2017-09-27 DIAGNOSIS — R935 Abnormal findings on diagnostic imaging of other abdominal regions, including retroperitoneum: Secondary | ICD-10-CM | POA: Diagnosis not present

## 2017-09-27 NOTE — Patient Instructions (Signed)
Please follow up as needed 

## 2017-09-29 DIAGNOSIS — G4733 Obstructive sleep apnea (adult) (pediatric): Secondary | ICD-10-CM | POA: Diagnosis not present

## 2017-10-04 ENCOUNTER — Encounter: Payer: Self-pay | Admitting: Internal Medicine

## 2017-10-04 NOTE — Progress Notes (Signed)
HISTORY OF PRESENT ILLNESS:  Oscar [name] is a 84 y.o. male with multiple medical problems and reported esophageal cancer for which she underwent surgery in [place] and 2001. Established in this office March 2015. History of Barrett's esophagus reported. Last seen April 2015 when Oscar [name] underwent upper endoscopy. There was evidence of prior distal esophageal resection with gastroenteric anastomosis at 25 cm. A small tongue of columnar type mucosa was biopsied and revealed nondysplastic Barrett's. No follow-up recommended given age. Oscar [name] was told to continue his PPI. Oscar [name] continues on omeprazole 40 mg twice daily. Oscar [name] also has a history of chronic constipation which is managed with MiraLAX. Oscar [name] presents today with chief complaint of small bowel obstruction secondary to adhesions for which she was hospitalized October 2018. I have reviewed the hospitalization. Managed medically. Seen a few weeks later with mild her recurrent symptoms which did not require admission. Told to follow-up with GI. Just getting around to that appointment at this time. Thankfully, no problems with recurrent abdominal pain or symptoms suggesting obstruction. On his twice daily PPI his reflux is under good control of symptoms. No dysphagia. 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 Also, Oscar [name] has poor dentition and states that is difficult for him to chew food. Oscar [name] had multiple questions. Oscar [name] had a number of things Oscar [name] did not understand with regard to his conditions.. We reviewed his last endoscopy report and biopsies. I also reviewed with him the nature of his recent hospitalization.  REVIEW OF SYSTEMS:  All non-GI ROS negative except for arthritis, back pain, anxiety, hearing problems, itching, excessive urination with urinary frequency, insomnia  Past Medical History:  Diagnosis Date  . Acid reflux   . Barrett's esophagus   . BPH (benign prostatic  hyperplasia)   . Colon polyps   . DDD (degenerative disc disease)   . Diabetes mellitus    controlled with diet and exercise  . ED (erectile dysfunction)   . Esophageal cancer ([place])    tx'd surgery - abdominal & right posterior chest approach (2001) - [place], [place]  . First degree AV block   . Gout   . Hemorrhoids   . History of palpitations   . Hyperlipidemia   . Hypertension   . Lipoma   . Obesity   . Osteoarthritis   . Tinea versicolor   . Tinnitus     Past Surgical History:  Procedure Laterality Date  . bilateral olecranon bursal excisions    . Esophageal Cancer Surgery  2000, 2001  . GASTRIC RESECTION    . HEMORRHOID SURGERY    . NM MYOCAR PERF WALL MOTION  2011   dipyridamole myoview - stress images show medium in size, moderate in intensity perfusion defect in basal inferior & mid inferior walls with mild defect reversibility at rest, EF 64%  . TRANSTHORACIC ECHOCARDIOGRAM  2011   EF=>55%, mild mitral annular calcif, mild calcif of MV apparatus, mild TR, normal RSVP, mild AV regurg, aortic root sclerosis/calcifiication  . VASECTOMY      Social History ELBA DENDINGER  reports that Oscar [name] has been smoking cigars and pipe.  Oscar [name] has smoked for the past 50.00 years. Oscar [name] has never used smokeless tobacco. Oscar [name] reports that Oscar [name] drinks about 3.5 - 7.0 oz of alcohol per week. Oscar [name] reports that Oscar [name] does not use drugs.  family history includes CAD in his mother; Diabetes in his mother; Stroke in his  sister.  Allergies  Allergen Reactions  . Bee Venom        PHYSICAL EXAMINATION: Vital signs: BP (!) 146/62   Pulse (!) 55   Ht 6\' 1"  (1.854 m)   Wt 193 lb (87.5 kg)   BMI 25.46 kg/m   Constitutional: generally well-appearing, no acute distress Psychiatric: alert and oriented x3, cooperative Eyes: extraocular movements intact, anicteric, conjunctiva pink Mouth: oral pharynx moist, no lesions. Poor dentition Neck: supple no lymphadenopathy Cardiovascular: heart regular rate and  rhythm, no murmur Lungs: clear to auscultation bilaterally Abdomen: soft, nontender, nondistended, no obvious ascites, no peritoneal signs, normal bowel sounds, no organomegaly Rectal:omitted Extremities: no clubbing cyanosis or lower extremity edema bilaterally Skin: no lesions on visible extremities Neuro: No focal deficits. Cranial nerves intact  ASSESSMENT:  1. History of small bowel obstruction secondary to adhesions. Abnormal CT scan secondary to the same. Resolved. Nothing further needs to be done 2. GERD, complicated by Barrett's esophagus with esophageal cancer status post remote esophagectomy. Aged out of surveillance. Doing well on PPI 3. Chronic constipation. Managed with MiraLAX 4. Weight loss secondary to poor appetite and poor dentition   PLAN:  1. Discussion on small bowel obstruction. Should Oscar [name] have recurrent symptoms Oscar [name] should proceed to the emergency room 2. Reflux precautions 3. Continue PPI 4. Continue MiraLAX 5. See a dentist regarding poor dentition. Oscar [name] agrees 6. Resume care with PCP. GI follow-up as needed  45 minutes spent face-to-face with the patient. Greater than 50% a time use for counseling regarding his issues with small bowel obstruction, history of GERD complicated by Barrett's esophagus and esophageal cancer, treatment of constipation, and issues regarding his poor dentition

## 2017-10-05 DIAGNOSIS — N401 Enlarged prostate with lower urinary tract symptoms: Secondary | ICD-10-CM | POA: Diagnosis not present

## 2017-10-05 DIAGNOSIS — R3915 Urgency of urination: Secondary | ICD-10-CM | POA: Diagnosis not present

## 2017-10-05 DIAGNOSIS — R350 Frequency of micturition: Secondary | ICD-10-CM | POA: Diagnosis not present

## 2017-10-05 DIAGNOSIS — R3121 Asymptomatic microscopic hematuria: Secondary | ICD-10-CM | POA: Diagnosis not present

## 2017-10-13 IMAGING — CT CT ABD-PELV W/ CM
2 of 5 series · 16 of 46 positions shown, 18 images · IV contrast (isovue)
Comparison: 09/12/2014

CLINICAL DATA: Upper abdominal pain, history of surgery for
esophageal cancer

EXAM:
CT ABDOMEN AND PELVIS WITH CONTRAST
TECHNIQUE: Multidetector CT imaging of the abdomen and pelvis was performed
using the standard protocol following bolus administration of
intravenous contrast.
CONTRAST:  100 cc Isovue 300 intravenous

[Series 2: abd/pel with · axial · 0.77mm/px · z∈[+1235,+1630]mm · 13 of 89 slices shown, 15 images]
[im 5/89  soft-tissue]
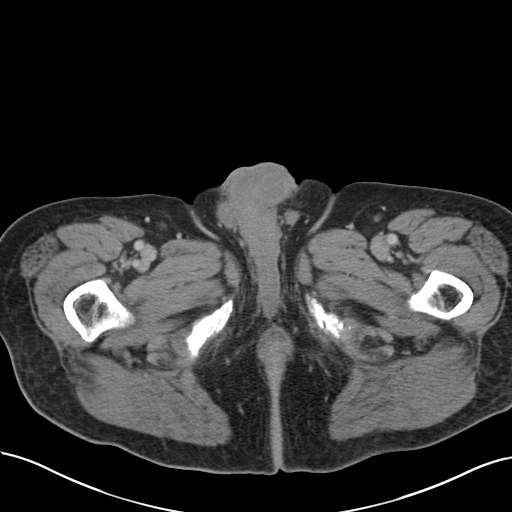
[im 5/89  bone]
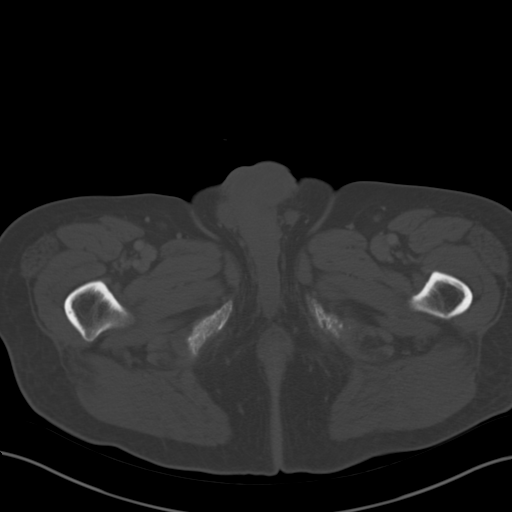
[im 10/89  soft-tissue]
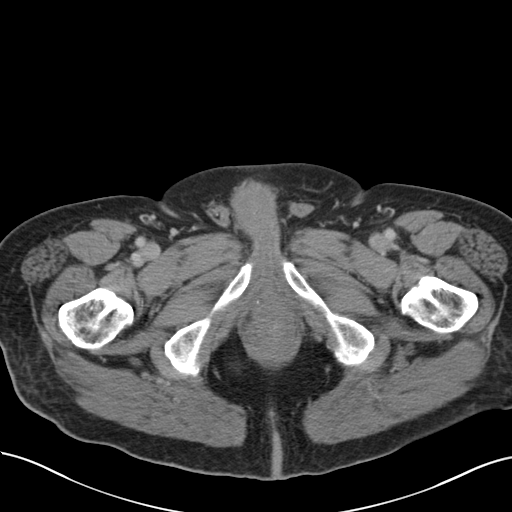
[im 20/89  soft-tissue]
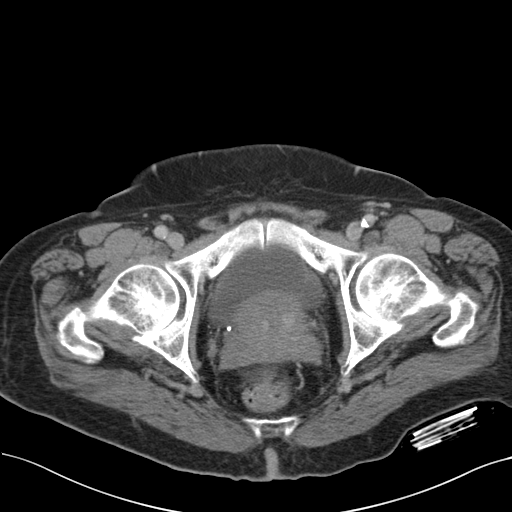
[im 25/89  soft-tissue]
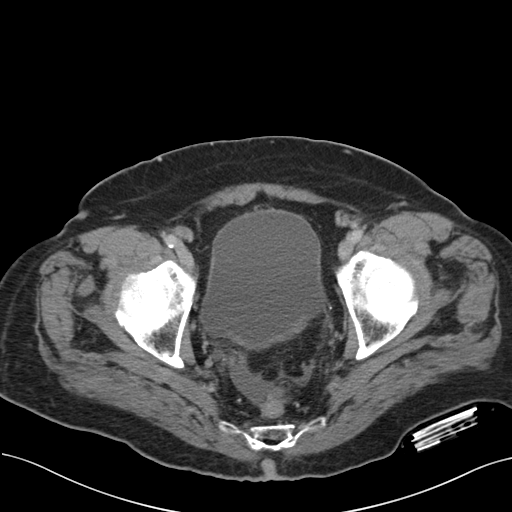
[im 30/89  soft-tissue]
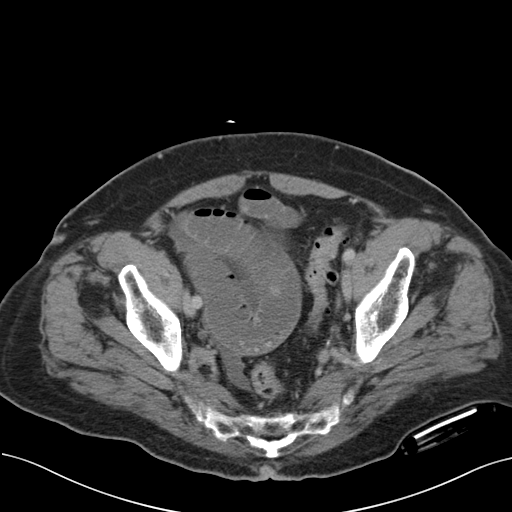
[im 40/89  soft-tissue]
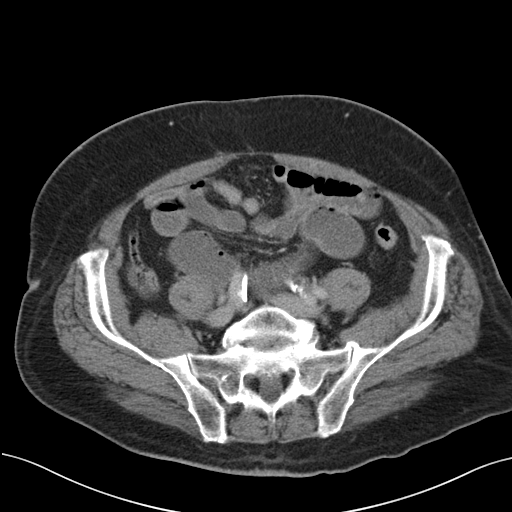
[im 45/89  soft-tissue]
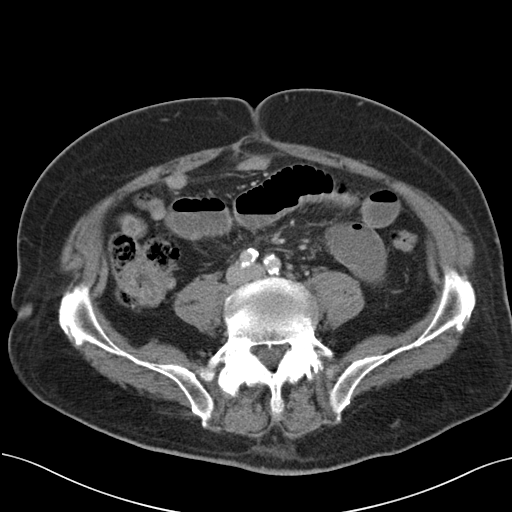
[im 49/89  soft-tissue]
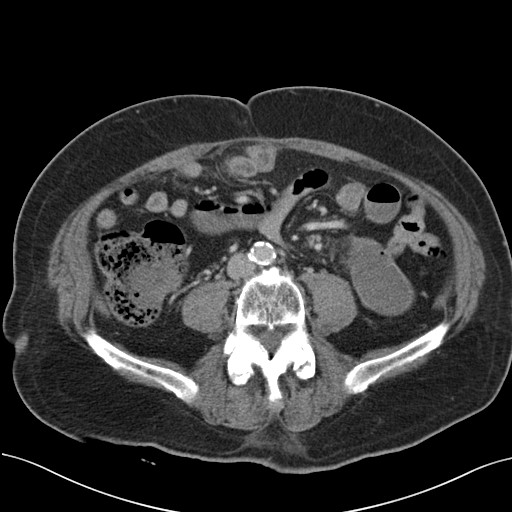
[im 59/89  soft-tissue]
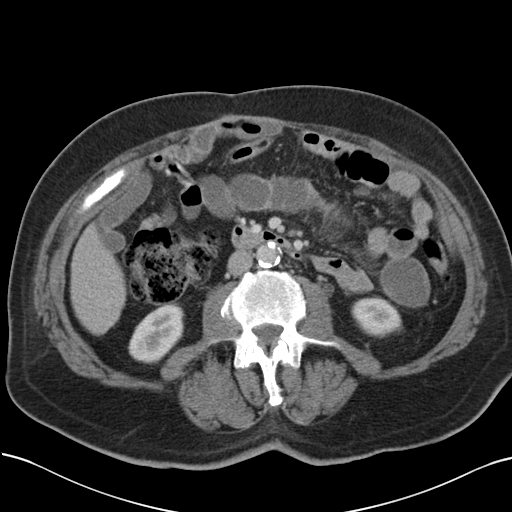
[im 59/89  bone]
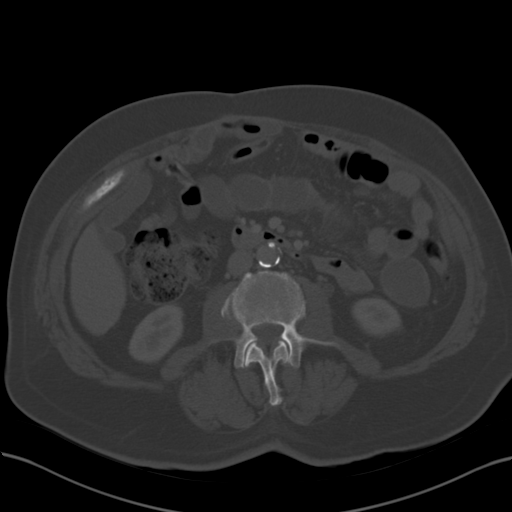
[im 64/89  soft-tissue]
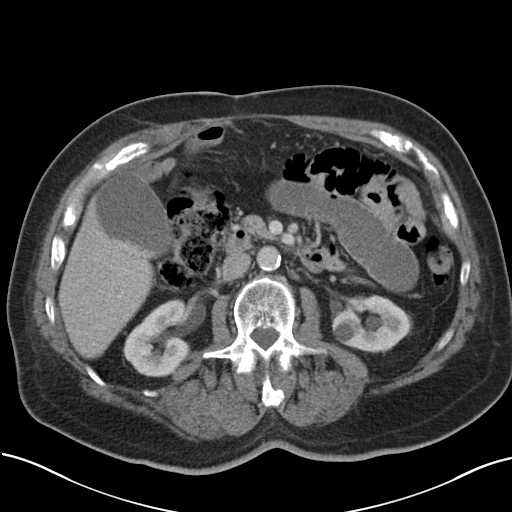
[im 69/89  soft-tissue]
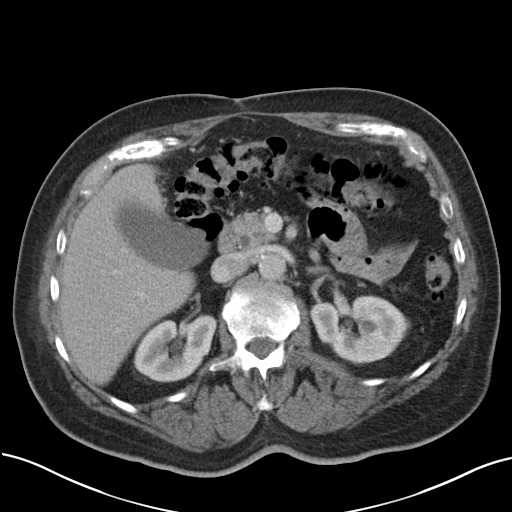
[im 79/89  soft-tissue]
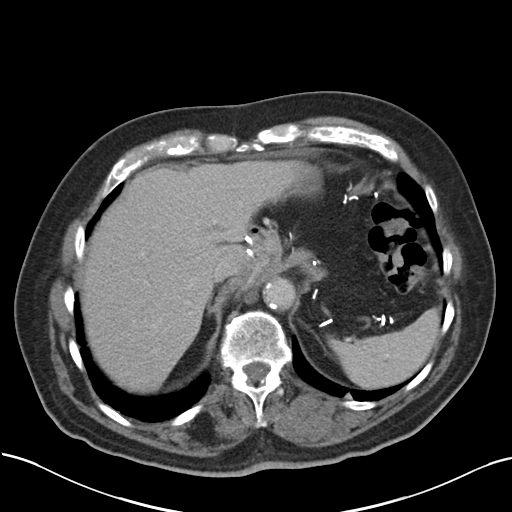
[im 84/89  soft-tissue]
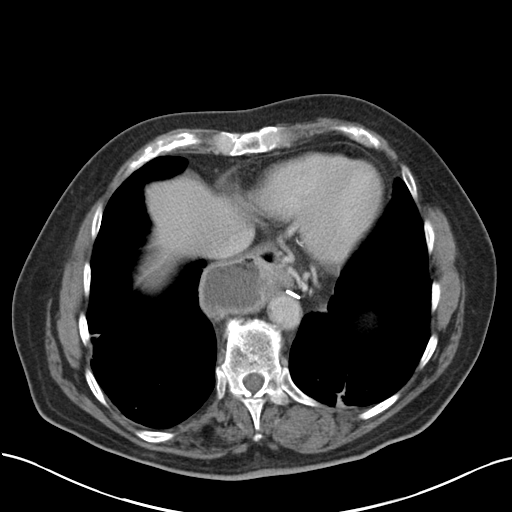

[Series 4: coronal a/|p · coronal · 0.74mm/px · 3 of 136 slices shown]
[im 46/136  soft-tissue]
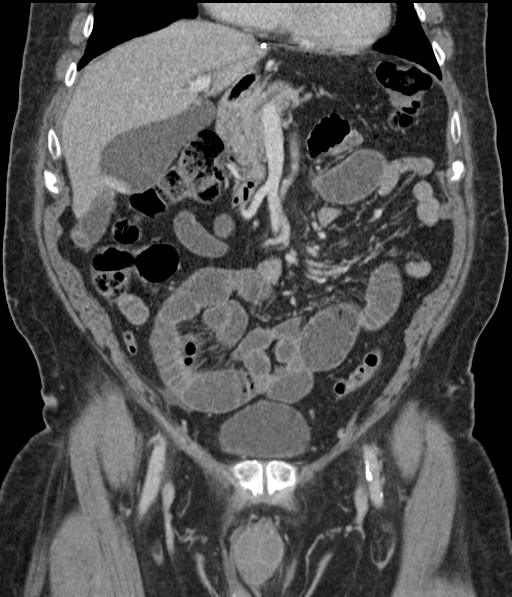
[im 61/136  soft-tissue]
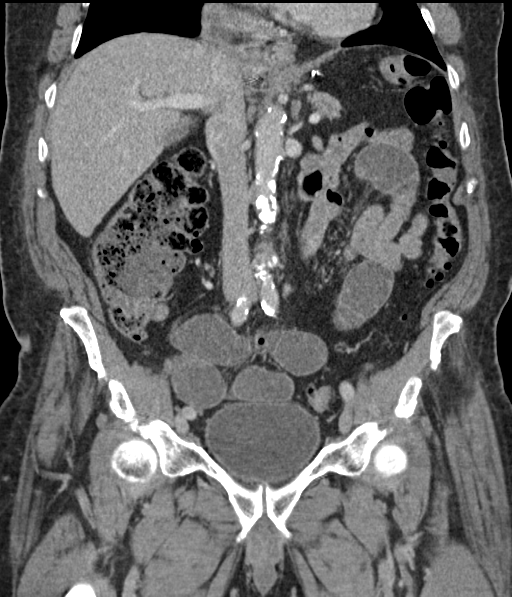
[im 76/136  soft-tissue]
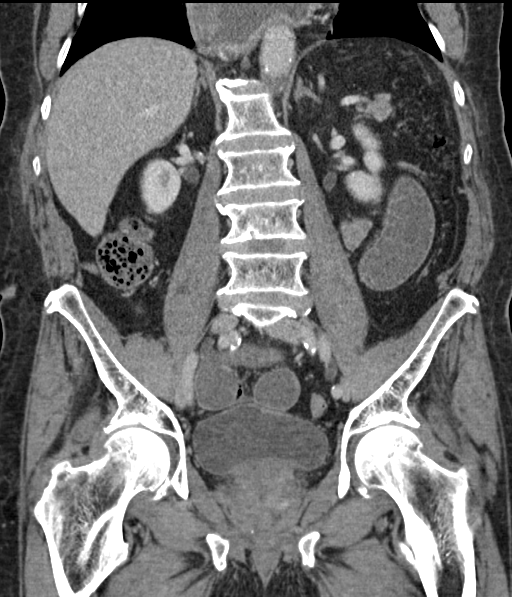

[16 of 46 positions shown; findings below may reference images not displayed]

FINDINGS: Lower chest: Lung bases demonstrate scarring in the posterior lung
bases. No pleural effusion or consolidation. Borderline to mild
cardiomegaly. Evidence of prior gastric pull-up surgery.

Hepatobiliary: Dilated gallbladder containing multiple stones. No
biliary dilatation or focal hepatic abnormality.

Pancreas: Unremarkable. No pancreatic ductal dilatation or
surrounding inflammatory changes.

Spleen: Normal in size without focal abnormality.

Adrenals/Urinary Tract: Adrenal glands are within normal limits.
Kidneys show no hydronephrosis. Stable hypodense lesions in the left
kidney presumably cysts. Bladder unremarkable.

Stomach/Bowel: Dilated loops of fluid-filled small bowel measuring
up to 3.2 cm within the abdomen and central pelvis with the at least
1 transition point visualized anteriorly at the umbilical level. No
colon wall thickening. Normal appendix. Sigmoid colon diverticula
without acute inflammation

Vascular/Lymphatic: Aortic atherosclerosis. No enlarged abdominal or
pelvic lymph nodes.

Reproductive: Markedly enlarged prostate with mass effect on the
posterior bladder

Other: Small amount of free fluid in the pelvis.  No free air.

Musculoskeletal: Degenerative changes of the spine. No acute or
suspicious bone lesion
IMPRESSION: 1. Several loops of fluid-filled dilated small bowel within the
abdomen and pelvis with at least 1 transition point visualized
within the anterior abdomen ; findings would be consistent with
bowel obstruction, probably due to adhesions.
2. Dilated gallbladder with multiple stones. Could correlate with
ultrasound if symptoms are suggestive of gallbladder pathology
3. Evidence of prior gastric pull-up surgery.
4. Small amount of free fluid in the pelvis
5. Sigmoid colon diverticular disease without acute inflammation

## 2017-10-30 DIAGNOSIS — G4733 Obstructive sleep apnea (adult) (pediatric): Secondary | ICD-10-CM | POA: Diagnosis not present

## 2017-11-06 ENCOUNTER — Other Ambulatory Visit: Payer: Self-pay

## 2017-11-06 ENCOUNTER — Emergency Department (HOSPITAL_COMMUNITY): Payer: Medicare Other

## 2017-11-06 ENCOUNTER — Inpatient Hospital Stay (HOSPITAL_COMMUNITY): Payer: Medicare Other

## 2017-11-06 ENCOUNTER — Encounter (HOSPITAL_COMMUNITY): Payer: Self-pay

## 2017-11-06 ENCOUNTER — Inpatient Hospital Stay (HOSPITAL_COMMUNITY)
Admission: EM | Admit: 2017-11-06 | Discharge: 2017-11-23 | DRG: 388 | Disposition: A | Discharge status: Home Health | Payer: Medicare Other | Attending: Family Medicine | Admitting: Family Medicine

## 2017-11-06 DIAGNOSIS — K56609 Unspecified intestinal obstruction, unspecified as to partial versus complete obstruction: Secondary | ICD-10-CM | POA: Diagnosis not present

## 2017-11-06 DIAGNOSIS — R05 Cough: Secondary | ICD-10-CM | POA: Diagnosis not present

## 2017-11-06 DIAGNOSIS — A4159 Other Gram-negative sepsis: Secondary | ICD-10-CM | POA: Diagnosis not present

## 2017-11-06 DIAGNOSIS — E875 Hyperkalemia: Secondary | ICD-10-CM | POA: Diagnosis present

## 2017-11-06 DIAGNOSIS — G4733 Obstructive sleep apnea (adult) (pediatric): Secondary | ICD-10-CM | POA: Diagnosis not present

## 2017-11-06 DIAGNOSIS — R140 Abdominal distension (gaseous): Secondary | ICD-10-CM | POA: Diagnosis not present

## 2017-11-06 DIAGNOSIS — E876 Hypokalemia: Secondary | ICD-10-CM | POA: Diagnosis present

## 2017-11-06 DIAGNOSIS — E440 Moderate protein-calorie malnutrition: Secondary | ICD-10-CM | POA: Diagnosis not present

## 2017-11-06 DIAGNOSIS — B961 Klebsiella pneumoniae [K. pneumoniae] as the cause of diseases classified elsewhere: Secondary | ICD-10-CM | POA: Diagnosis present

## 2017-11-06 DIAGNOSIS — Z9049 Acquired absence of other specified parts of digestive tract: Secondary | ICD-10-CM

## 2017-11-06 DIAGNOSIS — N400 Enlarged prostate without lower urinary tract symptoms: Secondary | ICD-10-CM | POA: Diagnosis not present

## 2017-11-06 DIAGNOSIS — E1165 Type 2 diabetes mellitus with hyperglycemia: Secondary | ICD-10-CM | POA: Diagnosis not present

## 2017-11-06 DIAGNOSIS — R0603 Acute respiratory distress: Secondary | ICD-10-CM | POA: Diagnosis not present

## 2017-11-06 DIAGNOSIS — F17290 Nicotine dependence, other tobacco product, uncomplicated: Secondary | ICD-10-CM | POA: Diagnosis not present

## 2017-11-06 DIAGNOSIS — J690 Pneumonitis due to inhalation of food and vomit: Secondary | ICD-10-CM | POA: Diagnosis not present

## 2017-11-06 DIAGNOSIS — I169 Hypertensive crisis, unspecified: Secondary | ICD-10-CM | POA: Diagnosis not present

## 2017-11-06 DIAGNOSIS — Z6824 Body mass index (BMI) 24.0-24.9, adult: Secondary | ICD-10-CM

## 2017-11-06 DIAGNOSIS — Z8501 Personal history of malignant neoplasm of esophagus: Secondary | ICD-10-CM | POA: Diagnosis not present

## 2017-11-06 DIAGNOSIS — K219 Gastro-esophageal reflux disease without esophagitis: Secondary | ICD-10-CM | POA: Diagnosis present

## 2017-11-06 DIAGNOSIS — Z888 Allergy status to other drugs, medicaments and biological substances status: Secondary | ICD-10-CM | POA: Diagnosis not present

## 2017-11-06 DIAGNOSIS — K8020 Calculus of gallbladder without cholecystitis without obstruction: Secondary | ICD-10-CM | POA: Diagnosis not present

## 2017-11-06 DIAGNOSIS — J9621 Acute and chronic respiratory failure with hypoxia: Secondary | ICD-10-CM | POA: Diagnosis not present

## 2017-11-06 DIAGNOSIS — Z9109 Other allergy status, other than to drugs and biological substances: Secondary | ICD-10-CM

## 2017-11-06 DIAGNOSIS — Z91030 Bee allergy status: Secondary | ICD-10-CM | POA: Diagnosis not present

## 2017-11-06 DIAGNOSIS — I10 Essential (primary) hypertension: Secondary | ICD-10-CM | POA: Diagnosis present

## 2017-11-06 DIAGNOSIS — R109 Unspecified abdominal pain: Secondary | ICD-10-CM | POA: Diagnosis not present

## 2017-11-06 DIAGNOSIS — R1110 Vomiting, unspecified: Secondary | ICD-10-CM

## 2017-11-06 DIAGNOSIS — Z5320 Procedure and treatment not carried out because of patient's decision for unspecified reasons: Secondary | ICD-10-CM | POA: Diagnosis present

## 2017-11-06 DIAGNOSIS — R918 Other nonspecific abnormal finding of lung field: Secondary | ICD-10-CM | POA: Diagnosis not present

## 2017-11-06 DIAGNOSIS — J189 Pneumonia, unspecified organism: Secondary | ICD-10-CM | POA: Diagnosis not present

## 2017-11-06 DIAGNOSIS — E119 Type 2 diabetes mellitus without complications: Secondary | ICD-10-CM | POA: Diagnosis not present

## 2017-11-06 DIAGNOSIS — N179 Acute kidney failure, unspecified: Secondary | ICD-10-CM | POA: Diagnosis not present

## 2017-11-06 DIAGNOSIS — R509 Fever, unspecified: Secondary | ICD-10-CM | POA: Diagnosis not present

## 2017-11-06 DIAGNOSIS — Z0189 Encounter for other specified special examinations: Secondary | ICD-10-CM

## 2017-11-06 DIAGNOSIS — E46 Unspecified protein-calorie malnutrition: Secondary | ICD-10-CM | POA: Diagnosis present

## 2017-11-06 DIAGNOSIS — R000 Tachycardia, unspecified: Secondary | ICD-10-CM | POA: Diagnosis not present

## 2017-11-06 DIAGNOSIS — Z95828 Presence of other vascular implants and grafts: Secondary | ICD-10-CM | POA: Diagnosis not present

## 2017-11-06 DIAGNOSIS — C159 Malignant neoplasm of esophagus, unspecified: Secondary | ICD-10-CM | POA: Diagnosis not present

## 2017-11-06 DIAGNOSIS — R7881 Bacteremia: Secondary | ICD-10-CM | POA: Diagnosis not present

## 2017-11-06 DIAGNOSIS — R0602 Shortness of breath: Secondary | ICD-10-CM

## 2017-11-06 DIAGNOSIS — T17908A Unspecified foreign body in respiratory tract, part unspecified causing other injury, initial encounter: Secondary | ICD-10-CM

## 2017-11-06 DIAGNOSIS — K449 Diaphragmatic hernia without obstruction or gangrene: Secondary | ICD-10-CM | POA: Diagnosis present

## 2017-11-06 DIAGNOSIS — Z79899 Other long term (current) drug therapy: Secondary | ICD-10-CM

## 2017-11-06 DIAGNOSIS — B9689 Other specified bacterial agents as the cause of diseases classified elsewhere: Secondary | ICD-10-CM | POA: Diagnosis not present

## 2017-11-06 DIAGNOSIS — E785 Hyperlipidemia, unspecified: Secondary | ICD-10-CM | POA: Diagnosis not present

## 2017-11-06 DIAGNOSIS — R143 Flatulence: Secondary | ICD-10-CM | POA: Diagnosis not present

## 2017-11-06 DIAGNOSIS — K56600 Partial intestinal obstruction, unspecified as to cause: Secondary | ICD-10-CM | POA: Diagnosis not present

## 2017-11-06 DIAGNOSIS — R9389 Abnormal findings on diagnostic imaging of other specified body structures: Secondary | ICD-10-CM

## 2017-11-06 DIAGNOSIS — Z833 Family history of diabetes mellitus: Secondary | ICD-10-CM | POA: Diagnosis not present

## 2017-11-06 DIAGNOSIS — R32 Unspecified urinary incontinence: Secondary | ICD-10-CM | POA: Diagnosis present

## 2017-11-06 LAB — URINALYSIS, ROUTINE W REFLEX MICROSCOPIC
BILIRUBIN URINE: NEGATIVE
Bacteria, UA: NONE SEEN
Glucose, UA: NEGATIVE mg/dL
KETONES UR: 5 mg/dL — AB
LEUKOCYTES UA: NEGATIVE
NITRITE: NEGATIVE
PH: 6 (ref 5.0–8.0)
Protein, ur: NEGATIVE mg/dL
Specific Gravity, Urine: 1.024 (ref 1.005–1.030)

## 2017-11-06 LAB — CBC
HCT: 42.8 % (ref 39.0–52.0)
Hemoglobin: 14.8 g/dL (ref 13.0–17.0)
MCH: 32.7 pg (ref 26.0–34.0)
MCHC: 34.6 g/dL (ref 30.0–36.0)
MCV: 94.7 fL (ref 78.0–100.0)
Platelets: 304 10*3/uL (ref 150–400)
RBC: 4.52 MIL/uL (ref 4.22–5.81)
RDW: 12.3 % (ref 11.5–15.5)
WBC: 12.9 10*3/uL — AB (ref 4.0–10.5)

## 2017-11-06 LAB — COMPREHENSIVE METABOLIC PANEL
ALT: 13 U/L (ref 0–44)
AST: 23 U/L (ref 15–41)
Albumin: 4.2 g/dL (ref 3.5–5.0)
Alkaline Phosphatase: 71 U/L (ref 38–126)
Anion gap: 12 (ref 5–15)
BILIRUBIN TOTAL: 1.8 mg/dL — AB (ref 0.3–1.2)
BUN: 11 mg/dL (ref 8–23)
CO2: 25 mmol/L (ref 22–32)
CREATININE: 0.9 mg/dL (ref 0.61–1.24)
Calcium: 9.1 mg/dL (ref 8.9–10.3)
Chloride: 96 mmol/L — ABNORMAL LOW (ref 98–111)
GFR calc Af Amer: >60 mL/min (ref >60)
Glucose, Bld: 129 mg/dL — ABNORMAL HIGH (ref 70–99)
Potassium: 4.2 mmol/L (ref 3.5–5.1)
Sodium: 133 mmol/L — ABNORMAL LOW (ref 135–145)
TOTAL PROTEIN: 6.9 g/dL (ref 6.5–8.1)

## 2017-11-06 LAB — LIPASE, BLOOD: Lipase: 22 U/L (ref 11–51)

## 2017-11-06 MED ORDER — DIATRIZOATE MEGLUMINE & SODIUM 66-10 % PO SOLN
90.0000 mL | Freq: Once | ORAL | Status: DC
Start: 2017-11-07 — End: 2017-11-07
  Filled 2017-11-06: qty 90

## 2017-11-06 MED ORDER — POTASSIUM CHLORIDE 2 MEQ/ML IV SOLN
INTRAVENOUS | Status: DC
  Administered 2017-11-06 – 2017-11-07 (×2): via INTRAVENOUS
  Filled 2017-11-06 (×4): qty 1000

## 2017-11-06 MED ORDER — IOPAMIDOL (ISOVUE-300) INJECTION 61%
INTRAVENOUS | Status: AC
  Administered 2017-11-06: 09:00:00
  Filled 2017-11-06: qty 100

## 2017-11-06 MED ORDER — PROMETHAZINE HCL 25 MG RE SUPP: 12.5000 mg | Freq: Four times a day (QID) | RECTAL | Status: DC | PRN

## 2017-11-06 MED ORDER — DIATRIZOATE MEGLUMINE & SODIUM 66-10 % PO SOLN
90.0000 mL | Freq: Once | ORAL | Status: AC
  Administered 2017-11-06: 90 mL via ORAL
  Filled 2017-11-06: qty 90

## 2017-11-06 MED ORDER — IOPAMIDOL (ISOVUE-300) INJECTION 61%
100.0000 mL | Freq: Once | INTRAVENOUS | Status: AC | PRN
  Administered 2017-11-06: 100 mL via INTRAVENOUS

## 2017-11-06 MED ORDER — FAMOTIDINE IN NACL 20-0.9 MG/50ML-% IV SOLN
20.0000 mg | Freq: Every day | INTRAVENOUS | Status: DC
  Administered 2017-11-06 – 2017-11-08 (×3): 20 mg via INTRAVENOUS
  Filled 2017-11-06 (×3): qty 50

## 2017-11-06 MED ORDER — ONDANSETRON HCL 4 MG PO TABS: 4.0000 mg | ORAL_TABLET | Freq: Four times a day (QID) | ORAL | Status: DC | PRN

## 2017-11-06 MED ORDER — ONDANSETRON HCL 4 MG/2ML IJ SOLN
4.0000 mg | Freq: Four times a day (QID) | INTRAMUSCULAR | Status: DC | PRN
  Administered 2017-11-06 – 2017-11-16 (×9): 4 mg via INTRAVENOUS
  Filled 2017-11-06 (×10): qty 2

## 2017-11-06 MED ORDER — MORPHINE SULFATE (PF) 4 MG/ML IV SOLN
6.0000 mg | Freq: Once | INTRAVENOUS | Status: AC
  Administered 2017-11-06: 6 mg via INTRAVENOUS
  Filled 2017-11-06: qty 2

## 2017-11-06 MED ORDER — METOPROLOL TARTRATE 5 MG/5ML IV SOLN
10.0000 mg | Freq: Four times a day (QID) | INTRAVENOUS | Status: DC
  Administered 2017-11-06 – 2017-11-21 (×61): 10 mg via INTRAVENOUS
  Filled 2017-11-06 (×62): qty 10

## 2017-11-06 MED ORDER — ENOXAPARIN SODIUM 40 MG/0.4ML ~~LOC~~ SOLN
40.0000 mg | Freq: Every day | SUBCUTANEOUS | Status: DC
  Administered 2017-11-06 – 2017-11-23 (×18): 40 mg via SUBCUTANEOUS
  Filled 2017-11-06 (×18): qty 0.4

## 2017-11-06 MED ORDER — ACETAMINOPHEN 650 MG RE SUPP
650.0000 mg | Freq: Four times a day (QID) | RECTAL | Status: DC | PRN
  Administered 2017-11-18: 650 mg via RECTAL
  Filled 2017-11-06 (×2): qty 1

## 2017-11-06 MED ORDER — MORPHINE SULFATE (PF) 2 MG/ML IV SOLN
1.0000 mg | INTRAVENOUS | Status: DC | PRN
Start: 2017-11-06 — End: 2017-11-23
  Administered 2017-11-06 – 2017-11-21 (×20): 1 mg via INTRAVENOUS
  Filled 2017-11-06 (×21): qty 1

## 2017-11-06 MED ORDER — ONDANSETRON HCL 4 MG/2ML IJ SOLN
4.0000 mg | Freq: Once | INTRAMUSCULAR | Status: AC
  Administered 2017-11-06: 4 mg via INTRAVENOUS
  Filled 2017-11-06: qty 2

## 2017-11-06 MED ORDER — ACETAMINOPHEN 325 MG PO TABS
650.0000 mg | ORAL_TABLET | Freq: Four times a day (QID) | ORAL | Status: DC | PRN
  Administered 2017-11-20 – 2017-11-21 (×2): 650 mg via ORAL
  Filled 2017-11-06 (×3): qty 2

## 2017-11-06 MED ORDER — PROMETHAZINE HCL 25 MG/ML IJ SOLN
12.5000 mg | Freq: Four times a day (QID) | INTRAMUSCULAR | Status: DC | PRN
  Administered 2017-11-06 – 2017-11-09 (×8): 12.5 mg via INTRAVENOUS
  Filled 2017-11-06 (×8): qty 1

## 2017-11-06 NOTE — H&P (Signed)
History and Physical    LEGEND TUMMINELLO NUU:[id number] DOB: May 27, 1932  DOA: 11/06/2017 PCP: Deland Pretty, MD  Patient coming from: home   Chief Complaint: N/V and abdominal pain  HPI: Oscar [name] is a 84 y.o. male with medical history significant of hypertension, remote esophageal cancer in remission from 2001, OSA who presented to the emergency department complaining of acute abdominal pain associated with nausea and vomiting for the past 24 hours.  She has history of bowel obstruction in the past and report that feels similar to previous episodes.  Pain is diffuse in the lower abdomen.  Patient last bowel movement and flatus was 6/29.  Currently denies any pain, however had nausea and vomiting.  Patient denies chest pain, shortness of breath and palpitation.  ED Course: Lab work-up remarkable for WBC 12.9, CT abdomen and pelvis shows small bowel obstruction with transition noted in the mid to distal small bowel in the midline of the pelvis.  Review of Systems:   General: no changes in body weight, no fever chills or decrease in energy.  HEENT: no blurry vision, hearing changes or sore throat Respiratory: no dyspnea, coughing, wheezing CV: no chest pain, no palpitations GI:   See HPI GU: no dysuria, burning on urination, increased urinary frequency, hematuria  Ext:. No deformities,  Neuro: no unilateral weakness, numbness, or tingling, no vision change or hearing loss Skin: No rashes, lesions or wounds. MSK: No muscle spasm, no deformity, no limitation of range of movement in spin Heme: No easy bruising.  Travel history: No recent long distant travel.   Past Medical History:  Diagnosis Date  . Acid reflux   . Barrett's esophagus   . BPH (benign prostatic hyperplasia)   . Colon polyps   . DDD (degenerative disc disease)   . Diabetes mellitus    controlled with diet and exercise  . ED (erectile dysfunction)   . Esophageal cancer ([place])    tx'd surgery - abdominal & right  posterior chest approach (2001) - [place], [place]  . First degree AV block   . Gout   . Hemorrhoids   . History of palpitations   . Hyperlipidemia   . Hypertension   . Lipoma   . Obesity   . Osteoarthritis   . Tinea versicolor   . Tinnitus     Past Surgical History:  Procedure Laterality Date  . bilateral olecranon bursal excisions    . Esophageal Cancer Surgery  2000, 2001  . GASTRIC RESECTION    . HEMORRHOID SURGERY    . NM MYOCAR PERF WALL MOTION  2011   dipyridamole myoview - stress images show medium in size, moderate in intensity perfusion defect in basal inferior & mid inferior walls with mild defect reversibility at rest, EF 64%  . TRANSTHORACIC ECHOCARDIOGRAM  2011   EF=>55%, mild mitral annular calcif, mild calcif of MV apparatus, mild TR, normal RSVP, mild AV regurg, aortic root sclerosis/calcifiication  . VASECTOMY       reports that he has been smoking cigars and pipe.  He has smoked for the past 50.00 years. He has never used smokeless tobacco. He reports that he drinks about 4.2 - 8.4 oz of alcohol per week. He reports that he does not use drugs.  Allergies  Allergen Reactions  . Bee Venom Anaphylaxis    Family History  Problem Relation Age of Onset  . Diabetes Mother   . CAD Mother   . Stroke Sister   . Colon cancer Neg  Hx   . Esophageal cancer Neg Hx   . Rectal cancer Neg Hx   . Stomach cancer Neg Hx     Prior to Admission medications   Medication Sig Start Date End Date Taking? Authorizing Provider  atenolol (TENORMIN) 25 MG tablet Take 1 tablet (25 mg total) by mouth daily. 07/09/13  Yes Hilty, Nadean Corwin, MD  HYDROcodone-acetaminophen (NORCO/VICODIN) 5-325 MG per tablet Take 1 tablet by mouth every 6 (six) hours as needed for moderate pain.   Yes [provider]  losartan-hydrochlorothiazide (HYZAAR) 100-12.5 MG tablet Take 0.5 tablets by mouth daily.  02/02/17  Yes [provider]  omeprazole (PRILOSEC) 20 MG capsule Take 40 mg by  mouth 2 (two) times daily before a meal.    Yes [provider]  polyethylene glycol (MIRALAX / GLYCOLAX) packet Take 17 g by mouth at bedtime as needed for mild constipation.    Yes [provider]    Physical Exam: Vitals:   11/06/17 0651 11/06/17 0730 11/06/17 0800 11/06/17 0853  BP: (!) 158/95 (!) 185/75 (!) 147/77 (!) 157/83  Pulse: 65 (!) 56 62 67  Resp: 16 14 14 14   Temp: 97.7 F (36.5 C)     TempSrc: Oral     SpO2: 98% 98% 96% 97%  Weight: 88.9 kg (196 lb)     Height: 6\' 2"  (1.88 m)       Constitutional: NAD Eyes: PERRL, lids and conjunctivae normal ENMT: Mucous membranes are moist. Posterior pharynx clear of any exudate or lesions. Neck: normal, supple, no masses, no thyromegaly Respiratory: clear to auscultation bilaterally, no wheezing, no crackles. Normal respiratory effort. No accessory muscle use.  Cardiovascular: Regular rate and rhythm, no murmurs / rubs / gallops. No extremity edema. 2+ pedal pulses. No carotid bruits.  Abdomen: Soft, mild distention, nontender, slow BS, midline scar, no organomegaly Musculoskeletal: no clubbing / cyanosis. No joint deformity upper and lower extremities. Good ROM Skin: no rashes, lesions, ulcers. No induration Neurologic: CN 2-12 grossly intact. Sensation intact, DTR normal. Strength 5/5 in all 4.  Psychiatric: Alert and oriented x 3. Normal mood.    Labs on Admission: I have personally reviewed following labs and imaging studies  CBC: Recent Labs  Lab 11/06/17 0735  WBC 12.9*  HGB 14.8  HCT 42.8  MCV 94.7  PLT 858   Basic Metabolic Panel: Recent Labs  Lab 11/06/17 0735  NA 133*  K 4.2  CL 96*  CO2 25  GLUCOSE 129*  BUN 11  CREATININE 0.90  CALCIUM 9.1   GFR: Estimated Creatinine Clearance: 73.6 mL/min (by C-G formula based on SCr of 0.9 mg/dL). Liver Function Tests: Recent Labs  Lab 11/06/17 0735  AST 23  ALT 13  ALKPHOS 71  BILITOT 1.8*  PROT 6.9  ALBUMIN 4.2   Recent Labs  Lab  11/06/17 0735  LIPASE 22   No results for input(s): AMMONIA in the last 168 hours. Coagulation Profile: No results for input(s): INR, PROTIME in the last 168 hours. Cardiac Enzymes: No results for input(s): CKTOTAL, CKMB, CKMBINDEX, TROPONINI in the last 168 hours. BNP (last 3 results) No results for input(s): PROBNP in the last 8760 hours. HbA1C: No results for input(s): HGBA1C in the last 72 hours. CBG: No results for input(s): GLUCAP in the last 168 hours. Lipid Profile: No results for input(s): CHOL, HDL, LDLCALC, TRIG, CHOLHDL, LDLDIRECT in the last 72 hours. Thyroid Function Tests: No results for input(s): TSH, T4TOTAL, FREET4, T3FREE, THYROIDAB in the last  72 hours. Anemia Panel: No results for input(s): VITAMINB12, FOLATE, FERRITIN, TIBC, IRON, RETICCTPCT in the last 72 hours. Urine analysis:    Component Value Date/Time   COLORURINE YELLOW 02/11/2017 2308   APPEARANCEUR CLEAR 02/11/2017 2308   LABSPEC 1.020 02/11/2017 2308   PHURINE 5.0 02/11/2017 2308   GLUCOSEU NEGATIVE 02/11/2017 2308   HGBUR SMALL (A) 02/11/2017 2308   BILIRUBINUR NEGATIVE 02/11/2017 2308   KETONESUR 5 (A) 02/11/2017 2308   PROTEINUR 30 (A) 02/11/2017 2308   UROBILINOGEN 0.2 09/12/2014 1816   NITRITE NEGATIVE 02/11/2017 2308   LEUKOCYTESUR NEGATIVE 02/11/2017 2308   Sepsis Labs: !!!!!!!!!!!!!!!!!!!!!!!!!!!!!!!!!!!!!!!!!!!! @LABRCNTIP (procalcitonin:4,lacticidven:4) )No results found for this or any previous visit (from the past 240 hour(s)).   Radiological Exams on Admission: Ct Abdomen Pelvis W Contrast  Result Date: 11/06/2017 CLINICAL DATA:  Abdominal pain, constipation, distention history of esophageal cancer EXAM: CT ABDOMEN AND PELVIS WITH CONTRAST TECHNIQUE: Multidetector CT imaging of the abdomen and pelvis was performed using the standard protocol following bolus administration of intravenous contrast. CONTRAST:  163mL ISOVUE-300 IOPAMIDOL (ISOVUE-300) INJECTION 61% COMPARISON:   02/24/2017 FINDINGS: Lower chest: Changes of prior soft ectomy and gastric pull-through again noted, stable. Scarring in the lung bases. No effusions. Mild cardiomegaly. Hepatobiliary: Layering stones within the gallbladder. No focal hepatic abnormality. Pancreas: No focal abnormality or ductal dilatation. Spleen: No focal abnormality.  Normal size. Adrenals/Urinary Tract: No adrenal abnormality. No focal renal abnormality. No stones or hydronephrosis. Urinary bladder is unremarkable. Stomach/Bowel: Dilated fluid-filled small bowel loops are noted in the abdomen and pelvis. Distal small bowel loops are decompressed. Findings compatible with small-bowel obstruction. Transition point appears to be in the midline of the pelvis on image 44. Normal appendix. Descending colonic and sigmoid diverticulosis. Vascular/Lymphatic: Diffuse aortic calcifications. No evidence of aneurysm or adenopathy. Reproductive: Mild prostate enlargement. Other: Small amount of free fluid in the pelvis.  No free air. Musculoskeletal: No acute bony abnormality. IMPRESSION: Small bowel obstruction with transition noted in the mid to distal small bowel in the midline of the pelvis. Cholelithiasis. Changes of esophagectomy and gastric pull-through. Cardiomegaly.  Scarring in the lung bases. Left colonic diverticulosis. Small amount of free fluid in the pelvis. Prostate enlargement. Electronically Signed   By: Rolm Baptise M.D.   On: 11/06/2017 08:45    Assessment/Plan Small bowel obstruction In setting of prior abdominal surgery and multiple small bowel obstructions in the past.  Will admit to [facility], place NG tube, keep n.p.o., pain control as needed, IV hydration, encourage ambulation. General surgery has been consulted.  Check electrolytes keep K above 4 and magnesium above 2.  HTN  BP slight elevated, likely from pain.  Holding oral hypertensive medication while n.p.o.  Will start metoprolol 10 mg IV every 6 hour.  Monitor heart rate  and BP closely.  GERD Pepcid IV while n.p.o.  DVT prophylaxis: Lovenox Code Status: Full code Family Communication: None at bedside Disposition Plan: Anticipate discharge to previous home environment.  Consults called: General surgery Admission status: MedSurg/Obs   Chipper Oman MD [facility] Pager: Text Page via [email or web address]  [phone]  If 7PM-7AM, please contact night-coverage [email or web address] Password [email or web address]  11/06/2017, 9:22 AM

## 2017-11-06 NOTE — ED Notes (Signed)
Pt aware UA needed. Urinal at bedside 

## 2017-11-06 NOTE — ED Notes (Signed)
ED TO INPATIENT HANDOFF REPORT  Name/Age/Gender Oscar [name] 84 y.o. male  Code Status Code Status History    Date Active Date Inactive Code Status Order ID Comments User Context   02/12/2017 0504 02/15/2017 1959 Full Code [id number]  Toy Baker, MD Inpatient   01/29/2017 1610 02/03/2017 1941 Full Code [id number]  Louellen Molder, MD Inpatient   10/31/2016 0301 11/02/2016 1945 Full Code [id number]  [name], MD ED      Home/SNF/Other Home  Chief Complaint constipation  Level of Care/Admitting Diagnosis ED Disposition    ED Disposition Condition [place] [facility] Area: [facility] [[id number]]  Level of Care: Med-Surg [16]  Diagnosis: SBO (small bowel obstruction) [facility]) [[id number]]  Admitting Physician: Patrecia Pour, EDWIN [[id number]]  Attending Physician: Patrecia Pour, EDWIN [[id number]]  Estimated length of stay: past midnight tomorrow  Certification:: I certify this patient will need inpatient services for at least 2 midnights  PT Class (Do Not Modify): Inpatient [101]  PT Acc Code (Do Not Modify): Private [1]       Medical History Past Medical History:  Diagnosis Date  . Acid reflux   . Barrett's esophagus   . BPH (benign prostatic hyperplasia)   . Colon polyps   . DDD (degenerative disc disease)   . Diabetes mellitus    controlled with diet and exercise  . ED (erectile dysfunction)   . Esophageal cancer ([place])    tx'd surgery - abdominal & right posterior chest approach (2001) - [place], [place]  . First degree AV block   . Gout   . Hemorrhoids   . History of palpitations   . Hyperlipidemia   . Hypertension   . Lipoma   . Obesity   . Osteoarthritis   . Tinea versicolor   . Tinnitus     Allergies Allergies  Allergen Reactions  . Bee Venom Anaphylaxis    IV Location/Drains/Wounds Patient Lines/Drains/Airways Status   Active Line/Drains/Airways    Name:   Placement date:   Placement time:   Site:   Days:    Peripheral IV 11/06/17 Right Forearm   11/06/17    0732    Forearm   less than 1          Labs/Imaging Results for orders placed or performed during the hospital encounter of 11/06/17 (from the past 48 hour(s))  Lipase, blood     Status: None   Collection Time: 11/06/17  7:35 AM  Result Value Ref Range   Lipase 22 11 - 51 U/L    Comment: Performed at [facility], [place] [address] [place], [place] [address]  Comprehensive metabolic panel     Status: Abnormal   Collection Time: 11/06/17  7:35 AM  Result Value Ref Range   Sodium 133 (L) 135 - 145 mmol/L   Potassium 4.2 3.5 - 5.1 mmol/L   Chloride 96 (L) 98 - 111 mmol/L    Comment: Please note change in reference range.   CO2 25 22 - 32 mmol/L   Glucose, Bld 129 (H) 70 - 99 mg/dL    Comment: Please note change in reference range.   BUN 11 8 - 23 mg/dL    Comment: Please note change in reference range.   Creatinine, Ser 0.90 0.61 - 1.24 mg/dL   Calcium 9.1 8.9 - 10.3 mg/dL   Total Protein 6.9 6.5 - 8.1 g/dL   Albumin 4.2 3.5 - 5.0 g/dL   AST 23 15 - 41  U/L   ALT 13 0 - 44 U/L    Comment: Please note change in reference range.   Alkaline Phosphatase 71 38 - 126 U/L   Total Bilirubin 1.8 (H) 0.3 - 1.2 mg/dL   GFR calc non Af Amer >60 >60 mL/min   GFR calc Af Amer >60 >60 mL/min    Comment: (NOTE) The eGFR has been calculated using the CKD EPI equation. This calculation has not been validated in all clinical situations. eGFR's persistently <60 mL/min signify possible Chronic Kidney Disease.    Anion gap 12 5 - 15    Comment: Performed at [facility], [place] [address], [place], [place] [address]  CBC     Status: Abnormal   Collection Time: 11/06/17  7:35 AM  Result Value Ref Range   WBC 12.9 (H) 4.0 - 10.5 K/uL   RBC 4.52 4.22 - 5.81 MIL/uL   Hemoglobin 14.8 13.0 - 17.0 g/dL   HCT 42.8 39.0 - 52.0 %   MCV 94.7 78.0 - 100.0 fL   MCH 32.7 26.0 - 34.0 pg   MCHC 34.6 30.0 - 36.0 g/dL   RDW  12.3 11.5 - 15.5 %   Platelets 304 150 - 400 K/uL    Comment: Performed at [facility], [place] [address] [place], [place] [address]   Ct Abdomen Pelvis W Contrast  Result Date: 11/06/2017 CLINICAL DATA:  Abdominal pain, constipation, distention history of esophageal cancer EXAM: CT ABDOMEN AND PELVIS WITH CONTRAST TECHNIQUE: Multidetector CT imaging of the abdomen and pelvis was performed using the standard protocol following bolus administration of intravenous contrast. CONTRAST:  11m ISOVUE-300 IOPAMIDOL (ISOVUE-300) INJECTION 61% COMPARISON:  02/24/2017 FINDINGS: Lower chest: Changes of prior soft ectomy and gastric pull-through again noted, stable. Scarring in the lung bases. No effusions. Mild cardiomegaly. Hepatobiliary: Layering stones within the gallbladder. No focal hepatic abnormality. Pancreas: No focal abnormality or ductal dilatation. Spleen: No focal abnormality.  Normal size. Adrenals/Urinary Tract: No adrenal abnormality. No focal renal abnormality. No stones or hydronephrosis. Urinary bladder is unremarkable. Stomach/Bowel: Dilated fluid-filled small bowel loops are noted in the abdomen and pelvis. Distal small bowel loops are decompressed. Findings compatible with small-bowel obstruction. Transition point appears to be in the midline of the pelvis on image 44. Normal appendix. Descending colonic and sigmoid diverticulosis. Vascular/Lymphatic: Diffuse aortic calcifications. No evidence of aneurysm or adenopathy. Reproductive: Mild prostate enlargement. Other: Small amount of free fluid in the pelvis.  No free air. Musculoskeletal: No acute bony abnormality. IMPRESSION: Small bowel obstruction with transition noted in the mid to distal small bowel in the midline of the pelvis. Cholelithiasis. Changes of esophagectomy and gastric pull-through. Cardiomegaly.  Scarring in the lung bases. Left colonic diverticulosis. Small amount of free fluid in the pelvis. Prostate  enlargement. Electronically Signed   By: KRolm BaptiseM.D.   On: 11/06/2017 08:45    Pending Labs Unresulted Labs (From admission, onward)   Start     Ordered   11/06/17 0715  Urinalysis, Routine w reflex microscopic  STAT,   STAT     11/06/17 0714      Vitals/Pain Today's Vitals   11/06/17 0731 11/06/17 0800 11/06/17 0804 11/06/17 0853  BP:  (!) 147/77  (!) 157/83  Pulse:  62  67  Resp:  14  14  Temp:      TempSrc:      SpO2:  96%  97%  Weight:      Height:  PainSc: 7   4      Isolation Precautions No active isolations  Medications Medications  morphine 4 MG/ML injection 6 mg (6 mg Intravenous Given 11/06/17 0732)  ondansetron (ZOFRAN) injection 4 mg (4 mg Intravenous Given 11/06/17 0732)  iopamidol (ISOVUE-300) 61 % injection 100 mL (100 mLs Intravenous Contrast Given 11/06/17 0829)  iopamidol (ISOVUE-300) 61 % injection (  Contrast Given 11/06/17 0849)    Mobility walks

## 2017-11-06 NOTE — ED Provider Notes (Signed)
[facility] DEPT Provider Note   CSN: [id number] Arrival date & time: 11/06/17  5093     History   Chief Complaint Chief Complaint  Patient presents with  . Abdominal Pain  . Constipation    HPI Oscar [name] is a 84 y.o. male.  84 year old male with history of prior bowel obstruction presents with 24 hours of abdominal distention, nausea, mid epigastric abdominal pain similar to his prior bowel obstructions.  Denies any bilious emesis.  Has passed some small pebbles of stool.  Has also used laxatives without relief.  Denies any fever or chills.  No urinary symptoms.  Nothing makes his symptoms better.     Past Medical History:  Diagnosis Date  . Acid reflux   . Barrett's esophagus   . BPH (benign prostatic hyperplasia)   . Colon polyps   . DDD (degenerative disc disease)   . Diabetes mellitus    controlled with diet and exercise  . ED (erectile dysfunction)   . Esophageal cancer ([place])    tx'd surgery - abdominal & right posterior chest approach (2001) - [place], [place]  . First degree AV block   . Gout   . Hemorrhoids   . History of palpitations   . Hyperlipidemia   . Hypertension   . Lipoma   . Obesity   . Osteoarthritis   . Tinea versicolor   . Tinnitus     Patient Active Problem List   Diagnosis Date Noted  . Malnutrition of moderate degree 02/15/2017  . Leukocytosis 02/12/2017  . OSA (obstructive sleep apnea) 02/12/2017  . Dehydration 02/12/2017  . Partial small bowel obstruction ([place]) 01/29/2017  . SBO (small bowel obstruction) ([place]) 10/31/2016  . Aortic insufficiency 07/09/2013  . HTN (hypertension) 07/09/2013  . PERSONAL HISTORY MALIGNANT NEOPLASM ESOPHAGUS 07/17/2009  . GERD 07/13/2009  . BARRETT'S ESOPHAGUS 07/13/2009  . DYSPHAGIA 07/13/2009  . FLATULENCE-GAS-BLOATING 07/13/2009    Past Surgical History:  Procedure Laterality Date  . bilateral olecranon bursal excisions    . Esophageal Cancer Surgery  2000,  2001  . GASTRIC RESECTION    . HEMORRHOID SURGERY    . NM MYOCAR PERF WALL MOTION  2011   dipyridamole myoview - stress images show medium in size, moderate in intensity perfusion defect in basal inferior & mid inferior walls with mild defect reversibility at rest, EF 64%  . TRANSTHORACIC ECHOCARDIOGRAM  2011   EF=>55%, mild mitral annular calcif, mild calcif of MV apparatus, mild TR, normal RSVP, mild AV regurg, aortic root sclerosis/calcifiication  . VASECTOMY          Home Medications    Prior to Admission medications   Medication Sig Start Date End Date Taking? Authorizing Provider  atenolol (TENORMIN) 25 MG tablet Take 1 tablet (25 mg total) by mouth daily. 07/09/13   Hilty, Nadean Corwin, MD  HYDROcodone-acetaminophen (NORCO/VICODIN) 5-325 MG per tablet Take 1 tablet by mouth every 6 (six) hours as needed for moderate pain.    [provider]  losartan-hydrochlorothiazide (HYZAAR) 100-12.5 MG tablet Take 1 tablet by mouth daily. 02/02/17   [provider]  omeprazole (PRILOSEC) 20 MG capsule Take 40 mg by mouth 2 (two) times daily before a meal.     [provider]  polyethylene glycol (MIRALAX / GLYCOLAX) packet Take 17 g by mouth as needed.    [provider]    Family History Family History  Problem Relation Age of Onset  . Diabetes Mother   .  CAD Mother   . Stroke Sister   . Colon cancer Neg Hx   . Esophageal cancer Neg Hx   . Rectal cancer Neg Hx   . Stomach cancer Neg Hx     Social History Social History   Tobacco Use  . Smoking status: Current Some Day Smoker    Years: 50.00    Types: Cigars, Pipe  . Smokeless tobacco: Never Used  . Tobacco comment: 2-3x/daily, sometimes not at all , tobacco info given 08/01/13  Substance Use Topics  . Alcohol use: Yes    Alcohol/week: 4.2 - 8.4 oz    Types: 7 - 14 Standard drinks or equivalent per week  . Drug use: No     Allergies   Bee venom   Review of Systems Review of Systems    All other systems reviewed and are negative.    Physical Exam Updated Vital Signs BP (!) 158/95 (BP Location: Right Arm)   Pulse 65   Temp 97.7 F (36.5 C) (Oral)   Resp 16   Ht 1.88 m (6\' 2" )   Wt 88.9 kg (196 lb)   SpO2 98%   BMI 25.16 kg/m   Physical Exam  Constitutional: He is oriented to person, place, and time. He appears well-developed and well-nourished.  Non-toxic appearance. No distress.  HENT:  Head: Normocephalic and atraumatic.  Eyes: Pupils are equal, round, and reactive to light. Conjunctivae, EOM and lids are normal.  Neck: Normal range of motion. Neck supple. No tracheal deviation present. No thyroid mass present.  Cardiovascular: Normal rate, regular rhythm and normal heart sounds. Exam reveals no gallop.  No murmur heard. Pulmonary/Chest: Effort normal and breath sounds normal. No stridor. No respiratory distress. He has no decreased breath sounds. He has no wheezes. He has no rhonchi. He has no rales.  Abdominal: Soft. Normal appearance and bowel sounds are normal. He exhibits distension. There is tenderness in the periumbilical area. There is guarding. There is no rigidity, no rebound and no CVA tenderness.    Musculoskeletal: Normal range of motion. He exhibits no edema or tenderness.  Neurological: He is alert and oriented to person, place, and time. He has normal strength. No cranial nerve deficit or sensory deficit. GCS eye subscore is 4. GCS verbal subscore is 5. GCS motor subscore is 6.  Skin: Skin is warm and dry. No abrasion and no rash noted.  Psychiatric: He has a normal mood and affect. His speech is normal and behavior is normal.  Nursing note and vitals reviewed.    ED Treatments / Results  Labs (all labs ordered are listed, but only abnormal results are displayed) Labs Reviewed  LIPASE, BLOOD  COMPREHENSIVE METABOLIC PANEL  CBC  URINALYSIS, ROUTINE W REFLEX MICROSCOPIC    EKG None  Radiology No results  found.  Procedures Procedures (including critical care time)  Medications Ordered in ED Medications  morphine 4 MG/ML injection 6 mg (has no administration in time range)  ondansetron (ZOFRAN) injection 4 mg (has no administration in time range)     Initial Impression / Assessment and Plan / ED Course  I have reviewed the triage vital signs and the nursing notes.  Pertinent labs & imaging results that were available during my care of the patient were reviewed by me and considered in my medical decision making (see chart for details).     Patient ordered pain medication for his abdominal discomfort.  Abdominal CT is consistent with small bowel obstruction.  Will have nursing  place NG tube and admit to medicine service  Final Clinical Impressions(s) / ED Diagnoses   Final diagnoses:  None    ED Discharge Orders    None       Lacretia Leigh, MD 11/06/17 [phone]

## 2017-11-06 NOTE — ED Notes (Signed)
Admitting MD at bedside.

## 2017-11-06 NOTE — ED Triage Notes (Signed)
Patient c/o abdominal pain and constipation. Patient states he has not had a complete BM x 2 days, but had a small BM yesterday. Patient states he took Dulcolax tabs x 2 last PM.

## 2017-11-06 NOTE — Consult Note (Signed)
[facility] Surgery Consult Note  Morse Brueggemann [facility] 16-Jan-1933  [id number].    Requesting MD: Quincy Simmonds Chief Complaint/Reason for Consult: SBO HPI:  Patient is an 84 year old male who presented to [facility] with 24 hr history of distention, nausea, vomiting, and abdominal pain. Did have some small pebble like stools but nothing substantial in the last 24 h. Has had bowel obstructions in the past and states this feels similarly. Pain located in lower abdomen along midline scar when present and radiates outward from that point. Currently patient has no pain and denies nausea. No flatus but feels like stomach is bubbling a little. Had some emesis in ED, NBNB. SBO has been resolved without surgery in the past. Refusing NGT. Occasionally takes miralax, but does not take anything daily to keep BMs regular. PMH significant for esophageal cancer s/p esophagectomy 2001, HTN, HLD, T2DM, BPH. Surgical history significant for gastric resection and esophagectomy. No blood thinning medications. Patient is a fairly active 83 year old gentleman and still works currently, has a [employer].   ROS: Review of Systems  Constitutional: Negative for chills and fever.  Respiratory: Negative for shortness of breath.   Cardiovascular: Negative for chest pain and palpitations.  Gastrointestinal: Positive for abdominal pain, nausea and vomiting. Negative for blood in stool, diarrhea and melena.  Genitourinary: Negative for dysuria, frequency and urgency.  All other systems reviewed and are negative.   Family History  Problem Relation Age of Onset  . Diabetes Mother   . CAD Mother   . Stroke Sister   . Colon cancer Neg Hx   . Esophageal cancer Neg Hx   . Rectal cancer Neg Hx   . Stomach cancer Neg Hx     Past Medical History:  Diagnosis Date  . Acid reflux   . Barrett's esophagus   . BPH (benign prostatic hyperplasia)   . Colon polyps   . DDD (degenerative disc disease)   . Diabetes mellitus    controlled with diet and exercise  . ED (erectile dysfunction)   . Esophageal cancer ([place])    tx'd surgery - abdominal & right posterior chest approach (2001) - [place], [place]  . First degree AV block   . Gout   . Hemorrhoids   . History of palpitations   . Hyperlipidemia   . Hypertension   . Lipoma   . Obesity   . Osteoarthritis   . Tinea versicolor   . Tinnitus     Past Surgical History:  Procedure Laterality Date  . bilateral olecranon bursal excisions    . Esophageal Cancer Surgery  2000, 2001  . GASTRIC RESECTION    . HEMORRHOID SURGERY    . NM MYOCAR PERF WALL MOTION  2011   dipyridamole myoview - stress images show medium in size, moderate in intensity perfusion defect in basal inferior & mid inferior walls with mild defect reversibility at rest, EF 64%  . TRANSTHORACIC ECHOCARDIOGRAM  2011   EF=>55%, mild mitral annular calcif, mild calcif of MV apparatus, mild TR, normal RSVP, mild AV regurg, aortic root sclerosis/calcifiication  . VASECTOMY      Social History:  reports that he has been smoking cigars and pipe.  He has smoked for the past 50.00 years. He has never used smokeless tobacco. He reports that he drinks about 4.2 - 8.4 oz of alcohol per week. He reports that he does not use drugs.  Allergies:  Allergies  Allergen Reactions  . Bee Venom Anaphylaxis  . Tamsulosin  Other (See Comments)    "Makes me feel weird"     (Not in a hospital admission)  Blood pressure (!) 147/92, pulse 78, temperature 97.7 F (36.5 C), temperature source Oral, resp. rate 14, height '6\' 2"'  (1.88 m), weight 88.9 kg (196 lb), SpO2 100 %. Physical Exam: Physical Exam  Constitutional: He is oriented to person, place, and time. He appears well-developed and well-nourished. He is cooperative.  Non-toxic appearance. No distress.  HENT:  Head: Normocephalic and atraumatic.  Right Ear: External ear normal.  Left Ear: External ear normal.  Nose: Nose normal.  Mouth/Throat: Oropharynx is  clear and moist and mucous membranes are normal.  Eyes: Pupils are equal, round, and reactive to light. Conjunctivae, EOM and lids are normal. No scleral icterus.  Neck: Normal range of motion and phonation normal. Neck supple. No tracheal deviation present.  Cardiovascular: Normal rate and regular rhythm.  Pulses:      Radial pulses are 2+ on the right side, and 2+ on the left side.       Dorsalis pedis pulses are 2+ on the right side, and 2+ on the left side.  Pulmonary/Chest: Effort normal and breath sounds normal.  Abdominal: Soft. Bowel sounds are normal. He exhibits no distension. There is no hepatosplenomegaly. There is tenderness in the periumbilical area. There is no rigidity, no rebound and no guarding. No hernia.    Musculoskeletal:  ROM grossly intact in bilateral upper and lower extremities, no gross deformities  Neurological: He is alert and oriented to person, place, and time. He has normal strength. No sensory deficit.  Skin: Skin is warm, dry and intact.  Psychiatric: He has a normal mood and affect. His speech is normal and behavior is normal.    Results for orders placed or performed during the hospital encounter of 11/06/17 (from the past 48 hour(s))  Urinalysis, Routine w reflex microscopic     Status: Abnormal   Collection Time: 11/06/17  7:15 AM  Result Value Ref Range   Color, Urine YELLOW YELLOW   APPearance CLEAR CLEAR   Specific Gravity, Urine 1.024 1.005 - 1.030   pH 6.0 5.0 - 8.0   Glucose, UA NEGATIVE NEGATIVE mg/dL   Hgb urine dipstick SMALL (A) NEGATIVE   Bilirubin Urine NEGATIVE NEGATIVE   Ketones, ur 5 (A) NEGATIVE mg/dL   Protein, ur NEGATIVE NEGATIVE mg/dL   Nitrite NEGATIVE NEGATIVE   Leukocytes, UA NEGATIVE NEGATIVE   RBC / HPF 0-5 0 - 5 RBC/hpf   WBC, UA 0-5 0 - 5 WBC/hpf   Bacteria, UA NONE SEEN NONE SEEN   Squamous Epithelial / LPF 0-5 0 - 5    Comment: Performed at [facility], [place] [address], [place], [place]  [address]  Lipase, blood     Status: None   Collection Time: 11/06/17  7:35 AM  Result Value Ref Range   Lipase 22 11 - 51 U/L    Comment: Performed at [facility], [place] [address], [place], [place] [address]  Comprehensive metabolic panel     Status: Abnormal   Collection Time: 11/06/17  7:35 AM  Result Value Ref Range   Sodium 133 (L) 135 - 145 mmol/L   Potassium 4.2 3.5 - 5.1 mmol/L   Chloride 96 (L) 98 - 111 mmol/L    Comment: Please note change in reference range.   CO2 25 22 - 32 mmol/L   Glucose, Bld 129 (H) 70 - 99 mg/dL    Comment:  Please note change in reference range.   BUN 11 8 - 23 mg/dL    Comment: Please note change in reference range.   Creatinine, Ser 0.90 0.61 - 1.24 mg/dL   Calcium 9.1 8.9 - 10.3 mg/dL   Total Protein 6.9 6.5 - 8.1 g/dL   Albumin 4.2 3.5 - 5.0 g/dL   AST 23 15 - 41 U/L   ALT 13 0 - 44 U/L    Comment: Please note change in reference range.   Alkaline Phosphatase 71 38 - 126 U/L   Total Bilirubin 1.8 (H) 0.3 - 1.2 mg/dL   GFR calc non Af Amer >60 >60 mL/min   GFR calc Af Amer >60 >60 mL/min    Comment: (NOTE) The eGFR has been calculated using the CKD EPI equation. This calculation has not been validated in all clinical situations. eGFR's persistently <60 mL/min signify possible Chronic Kidney Disease.    Anion gap 12 5 - 15    Comment: Performed at [facility], [place] [address], [place], [place] [address]  CBC     Status: Abnormal   Collection Time: 11/06/17  7:35 AM  Result Value Ref Range   WBC 12.9 (H) 4.0 - 10.5 K/uL   RBC 4.52 4.22 - 5.81 MIL/uL   Hemoglobin 14.8 13.0 - 17.0 g/dL   HCT 42.8 39.0 - 52.0 %   MCV 94.7 78.0 - 100.0 fL   MCH 32.7 26.0 - 34.0 pg   MCHC 34.6 30.0 - 36.0 g/dL   RDW 12.3 11.5 - 15.5 %   Platelets 304 150 - 400 K/uL    Comment: Performed at [facility], [place] [address], [place], [place] [address]   Ct Abdomen Pelvis W Contrast  Result Date:  11/06/2017 CLINICAL DATA:  Abdominal pain, constipation, distention history of esophageal cancer EXAM: CT ABDOMEN AND PELVIS WITH CONTRAST TECHNIQUE: Multidetector CT imaging of the abdomen and pelvis was performed using the standard protocol following bolus administration of intravenous contrast. CONTRAST:  163m ISOVUE-300 IOPAMIDOL (ISOVUE-300) INJECTION 61% COMPARISON:  02/24/2017 FINDINGS: Lower chest: Changes of prior soft ectomy and gastric pull-through again noted, stable. Scarring in the lung bases. No effusions. Mild cardiomegaly. Hepatobiliary: Layering stones within the gallbladder. No focal hepatic abnormality. Pancreas: No focal abnormality or ductal dilatation. Spleen: No focal abnormality.  Normal size. Adrenals/Urinary Tract: No adrenal abnormality. No focal renal abnormality. No stones or hydronephrosis. Urinary bladder is unremarkable. Stomach/Bowel: Dilated fluid-filled small bowel loops are noted in the abdomen and pelvis. Distal small bowel loops are decompressed. Findings compatible with small-bowel obstruction. Transition point appears to be in the midline of the pelvis on image 44. Normal appendix. Descending colonic and sigmoid diverticulosis. Vascular/Lymphatic: Diffuse aortic calcifications. No evidence of aneurysm or adenopathy. Reproductive: Mild prostate enlargement. Other: Small amount of free fluid in the pelvis.  No free air. Musculoskeletal: No acute bony abnormality. IMPRESSION: Small bowel obstruction with transition noted in the mid to distal small bowel in the midline of the pelvis. Cholelithiasis. Changes of esophagectomy and gastric pull-through. Cardiomegaly.  Scarring in the lung bases. Left colonic diverticulosis. Small amount of free fluid in the pelvis. Prostate enlargement. Electronically Signed   By: KRolm BaptiseM.D.   On: 11/06/2017 08:45      Assessment/Plan Hx of esophageal CA - s/p esophagectomy 2001 HTN T2DM HLD  BPH  SBO - CT: Dilated fluid-filled  small bowel loops are noted in the abdomen and pelvis. Distal small bowel loops are decompressed. Findings  compatible with small-bowel obstruction. Transition point appears to be in the midline of the pelvis - likely from adhesive disease - has had SBO in the past which have resolved non-operatively  - currently refusing NGT - I am ok with this as long as he is not vomiting, if needed can place NGT in radiology - SBO protocol with gastrogaffin PO  - we will follow  FEN: NPO, IVF;  VTE: SCDs ID: No abx indicated  Brigid Re, [facility] Surgery 11/06/2017, 10:38 AM Pager: [phone] Consults: [phone] Mon-Fri 7:00 am-4:30 pm Sat-Sun 7:00 am-11:30 am

## 2017-11-06 NOTE — ED Notes (Signed)
Attempted NG tube. Inserted tube approx 3" before hitting blockage. Went to reinsert tube in other nostril, pt stating that he cannot stand it. Pt refusing NG tube at this time.

## 2017-11-07 ENCOUNTER — Inpatient Hospital Stay (HOSPITAL_COMMUNITY): Payer: Medicare Other

## 2017-11-07 DIAGNOSIS — G4733 Obstructive sleep apnea (adult) (pediatric): Secondary | ICD-10-CM

## 2017-11-07 LAB — COMPREHENSIVE METABOLIC PANEL
ALBUMIN: 4.3 g/dL (ref 3.5–5.0)
ALT: 12 U/L (ref 0–44)
AST: 28 U/L (ref 15–41)
Alkaline Phosphatase: 69 U/L (ref 38–126)
Anion gap: 17 — ABNORMAL HIGH (ref 5–15)
BUN: 14 mg/dL (ref 8–23)
CHLORIDE: 98 mmol/L (ref 98–111)
CO2: 24 mmol/L (ref 22–32)
Calcium: 9.5 mg/dL (ref 8.9–10.3)
Creatinine, Ser: 0.96 mg/dL (ref 0.61–1.24)
GFR calc Af Amer: >60 mL/min (ref >60)
GFR calc non Af Amer: >60 mL/min (ref >60)
GLUCOSE: 112 mg/dL — AB (ref 70–99)
POTASSIUM: 5.2 mmol/L — AB (ref 3.5–5.1)
Sodium: 139 mmol/L (ref 135–145)
Total Bilirubin: 2 mg/dL — ABNORMAL HIGH (ref 0.3–1.2)
Total Protein: 7.6 g/dL (ref 6.5–8.1)

## 2017-11-07 LAB — CBC
HEMATOCRIT: 47.1 % (ref 39.0–52.0)
HEMOGLOBIN: 16.1 g/dL (ref 13.0–17.0)
MCH: 32.3 pg (ref 26.0–34.0)
MCHC: 34.2 g/dL (ref 30.0–36.0)
MCV: 94.4 fL (ref 78.0–100.0)
Platelets: 238 10*3/uL (ref 150–400)
RBC: 4.99 MIL/uL (ref 4.22–5.81)
RDW: 12.5 % (ref 11.5–15.5)
WBC: 14.4 10*3/uL — ABNORMAL HIGH (ref 4.0–10.5)

## 2017-11-07 LAB — MAGNESIUM: Magnesium: 1.8 mg/dL (ref 1.7–2.4)

## 2017-11-07 MED ORDER — LIP MEDEX EX OINT
TOPICAL_OINTMENT | CUTANEOUS | Status: AC
  Filled 2017-11-07: qty 7

## 2017-11-07 MED ORDER — HYDRALAZINE HCL 20 MG/ML IJ SOLN: 10.0000 mg | Freq: Four times a day (QID) | INTRAMUSCULAR | Status: DC | PRN

## 2017-11-07 MED ORDER — BISACODYL 10 MG RE SUPP
10.0000 mg | Freq: Once | RECTAL | Status: AC
  Administered 2017-11-07: 10 mg via RECTAL
  Filled 2017-11-07: qty 1

## 2017-11-07 MED ORDER — SODIUM CHLORIDE 0.9 % IV SOLN
INTRAVENOUS | Status: DC
  Administered 2017-11-07 – 2017-11-12 (×7): via INTRAVENOUS

## 2017-11-07 NOTE — Progress Notes (Addendum)
Initial Nutrition Assessment  DOCUMENTATION CODES:   Non-severe (moderate) malnutrition in context of chronic illness  INTERVENTION:    Boost Breeze po TID, each supplement provides 250 kcal and 9 grams of protein once advanced to clears  NUTRITION DIAGNOSIS:   Moderate Malnutrition related to chronic illness(recurrent SBO) as evidenced by energy intake < 75% for > or equal to 1 month, moderate muscle depletion, moderate fat depletion, severe muscle depletion.  GOAL:   Patient will meet greater than or equal to 90% of their needs  MONITOR:   PO intake, Diet advancement, Weight trends, Supplement acceptance, Labs  REASON FOR ASSESSMENT:   Malnutrition Screening Tool    ASSESSMENT:   Patient with PMH significant for HTN, remote esophogeal cancer s/p esophagectomy 2001, DM, and HLD. Presents this admission with complaints of nausea/vomiting x 24 hours. Found to have a small bowel obstruction. NGT unable to be placed in radiology.    Pt in pain during assessment and provided minimal information. He requests RD to come back once his pain is under control. From the information provided, pt states he had a poor appetite since being hospitalized for SBO one year ago. Reports he consumes two small meals each day that consist of different food options (unable to elaborate). His intake decreased to nothing over the last 24 hours due to increased nausea and vomiting. He is currently NPO. Pt amendable to supplementation once diet is advanced.   Pt endorses a UBW of 230 lb and a recent wt loss of 30 lb. Records indicate pt weighed 203 lb 01/31/18 and 196 lb this admission (3.4 % wt loss in 10 months, insignificant for time frame). Nutrition-Focused physical exam completed.   Medications reviewed.  Labs reviewed: K 5.2 (H)   NUTRITION - FOCUSED PHYSICAL EXAM:    Most Recent Value  Orbital Region  Mild depletion  Upper Arm Region  Moderate depletion  Thoracic and Lumbar Region  Unable to  assess  Buccal Region  Moderate depletion  Temple Region  Moderate depletion  Clavicle Bone Region  Severe depletion  Clavicle and Acromion Bone Region  Moderate depletion  Scapular Bone Region  Unable to assess  Dorsal Hand  Moderate depletion  Patellar Region  Moderate depletion  Anterior Thigh Region  Moderate depletion  Posterior Calf Region  Severe depletion  Edema (RD Assessment)  None  Hair  Reviewed  Eyes  Reviewed  Mouth  Reviewed  Skin  Reviewed  Nails  Reviewed     Diet Order:   Diet Order           Diet NPO time specified Except for: Ice Chips  Diet effective now          EDUCATION NEEDS:   Education needs have been addressed  Skin:  Skin Assessment: Reviewed RN Assessment  Last BM:  11/05/17- small  Height:   Ht Readings from Last 1 Encounters:  11/06/17 6\' 2"  (1.88 m)    Weight:   Wt Readings from Last 1 Encounters:  11/06/17 196 lb (88.9 kg)    Ideal Body Weight:  86.4 kg  BMI:  Body mass index is 25.16 kg/m.  Estimated Nutritional Needs:   Kcal:  2400-2600 kcal  Protein:  120-130 g  Fluid:  >2.4 L/day  Mariana Single RD, LDN Clinical Nutrition Pager # - [phone]

## 2017-11-07 NOTE — Progress Notes (Signed)
PROGRESS NOTE    Oscar [name]   UJW:[id number]  DOB: Jul 28, 1932  DOA: 11/06/2017 PCP: Deland Pretty, MD   Brief Narrative:  Oscar [name]   is a 84 y.o. male with medical history significant of hypertension, remote esophageal cancer in remission from 2001, OSA who presented to the emergency department complaining of acute abdominal pain associated with nausea and vomiting for the past 24 hours.  She has history of bowel obstruction in the past and report that feels similar to previous episodes.  Found to have a SBO. Unfortunately an NG tube was not able to be passed.   Subjective: No vomiting today. Last episode of vomiting was last night. No abdominal pain. Not passing gas. ROS: no complaints of nausea, vomiting, constipation diarrhea, cough, dyspnea or dysuria. No other complaints.   Assessment & Plan:   Principal Problem:   SBO (small bowel obstruction)  - unable to get NG - small bowel obstruction protocol reveals that contrast has not made it to the colon - getting dulcolax suppository today and ambulate  Active Problems: Mild hyperkalemia - cont IVF and recheck later today    HTN (hypertension) - BP stable on IV Lopressor to replace Atenolol but BP high - add IV Hydralazine - NS infusion will likely keep BP high    OSA (obstructive sleep apnea) CPAP  DVT prophylaxis: Lovenox Code Status: full code Family Communication:  Disposition Plan: home- cont to follow on med surg for now Consultants:   gen sugery Procedures:   none Antimicrobials:  Anti-infectives (From admission, onward)   None       Objective: Vitals:   11/06/17 1142 11/06/17 2136 11/07/17 0427 11/07/17 0900  BP: (!) 169/84 (!) 182/80 (!) 186/80 (!) 171/76  Pulse: 69 69 68   Resp: 18 17 19    Temp: 98.4 F (36.9 C) 98.3 F (36.8 C) 98.5 F (36.9 C)   TempSrc: Oral     SpO2: 100% 98% 99%   Weight:      Height:        Intake/Output Summary (Last 24 hours) at 11/07/2017 1307 Last  data filed at 11/07/2017 0429 Gross per 24 hour  Intake 1171.25 ml  Output 900 ml  Net 271.25 ml   Filed Weights   11/06/17 0651  Weight: 88.9 kg (196 lb)    Examination: General exam: Appears comfortable  HEENT: PERRLA, oral mucosa moist, no sclera icterus or thrush Respiratory system: Clear to auscultation. Respiratory effort normal. Cardiovascular system: S1 & S2 heard, RRR.   Gastrointestinal system: Abdomen soft, non-tender, moderately distended. no bowel sounds.  Central nervous system: Alert and oriented. No focal neurological deficits. Extremities: No cyanosis, clubbing or edema Skin: No rashes or ulcers Psychiatry:  Mood & affect appropriate.   Data Reviewed: I have personally reviewed following labs and imaging studies  CBC: Recent Labs  Lab 11/06/17 0735 11/07/17 0548  WBC 12.9* 14.4*  HGB 14.8 16.1  HCT 42.8 47.1  MCV 94.7 94.4  PLT 304 562   Basic Metabolic Panel: Recent Labs  Lab 11/06/17 0735 11/07/17 0548  NA 133* 139  K 4.2 5.2*  CL 96* 98  CO2 25 24  GLUCOSE 129* 112*  BUN 11 14  CREATININE 0.90 0.96  CALCIUM 9.1 9.5  MG  --  1.8   GFR: Estimated Creatinine Clearance: 69 mL/min (by C-G formula based on SCr of 0.96 mg/dL). Liver Function Tests: Recent Labs  Lab 11/06/17 0735 11/07/17 0548  AST 23 28  ALT  13 12  ALKPHOS 71 69  BILITOT 1.8* 2.0*  PROT 6.9 7.6  ALBUMIN 4.2 4.3   Recent Labs  Lab 11/06/17 0735  LIPASE 22   No results for input(s): AMMONIA in the last 168 hours. Coagulation Profile: No results for input(s): INR, PROTIME in the last 168 hours. Cardiac Enzymes: No results for input(s): CKTOTAL, CKMB, CKMBINDEX, TROPONINI in the last 168 hours. BNP (last 3 results) No results for input(s): PROBNP in the last 8760 hours. HbA1C: No results for input(s): HGBA1C in the last 72 hours. CBG: No results for input(s): GLUCAP in the last 168 hours. Lipid Profile: No results for input(s): CHOL, HDL, LDLCALC, TRIG, CHOLHDL,  LDLDIRECT in the last 72 hours. Thyroid Function Tests: No results for input(s): TSH, T4TOTAL, FREET4, T3FREE, THYROIDAB in the last 72 hours. Anemia Panel: No results for input(s): VITAMINB12, FOLATE, FERRITIN, TIBC, IRON, RETICCTPCT in the last 72 hours. Urine analysis:    Component Value Date/Time   COLORURINE YELLOW 11/06/2017 0715   APPEARANCEUR CLEAR 11/06/2017 0715   LABSPEC 1.024 11/06/2017 0715   PHURINE 6.0 11/06/2017 0715   GLUCOSEU NEGATIVE 11/06/2017 0715   HGBUR SMALL (A) 11/06/2017 0715   BILIRUBINUR NEGATIVE 11/06/2017 0715   KETONESUR 5 (A) 11/06/2017 0715   PROTEINUR NEGATIVE 11/06/2017 0715   UROBILINOGEN 0.2 09/12/2014 1816   NITRITE NEGATIVE 11/06/2017 0715   LEUKOCYTESUR NEGATIVE 11/06/2017 0715   Sepsis Labs: @LABRCNTIP (procalcitonin:4,lacticidven:4) )No results found for this or any previous visit (from the past 240 hour(s)).       Radiology Studies: Ct Abdomen Pelvis W Contrast  Result Date: 11/06/2017 CLINICAL DATA:  Abdominal pain, constipation, distention history of esophageal cancer EXAM: CT ABDOMEN AND PELVIS WITH CONTRAST TECHNIQUE: Multidetector CT imaging of the abdomen and pelvis was performed using the standard protocol following bolus administration of intravenous contrast. CONTRAST:  166mL ISOVUE-300 IOPAMIDOL (ISOVUE-300) INJECTION 61% COMPARISON:  02/24/2017 FINDINGS: Lower chest: Changes of prior soft ectomy and gastric pull-through again noted, stable. Scarring in the lung bases. No effusions. Mild cardiomegaly. Hepatobiliary: Layering stones within the gallbladder. No focal hepatic abnormality. Pancreas: No focal abnormality or ductal dilatation. Spleen: No focal abnormality.  Normal size. Adrenals/Urinary Tract: No adrenal abnormality. No focal renal abnormality. No stones or hydronephrosis. Urinary bladder is unremarkable. Stomach/Bowel: Dilated fluid-filled small bowel loops are noted in the abdomen and pelvis. Distal small bowel loops are  decompressed. Findings compatible with small-bowel obstruction. Transition point appears to be in the midline of the pelvis on image 44. Normal appendix. Descending colonic and sigmoid diverticulosis. Vascular/Lymphatic: Diffuse aortic calcifications. No evidence of aneurysm or adenopathy. Reproductive: Mild prostate enlargement. Other: Small amount of free fluid in the pelvis.  No free air. Musculoskeletal: No acute bony abnormality. IMPRESSION: Small bowel obstruction with transition noted in the mid to distal small bowel in the midline of the pelvis. Cholelithiasis. Changes of esophagectomy and gastric pull-through. Cardiomegaly.  Scarring in the lung bases. Left colonic diverticulosis. Small amount of free fluid in the pelvis. Prostate enlargement. Electronically Signed   By: Rolm Baptise M.D.   On: 11/06/2017 08:45   Dg Abd Portable 1v  Result Date: 11/07/2017 CLINICAL DATA:  Abdominal pain, distention EXAM: PORTABLE ABDOMEN - 1 VIEW COMPARISON:  11/06/2017 FINDINGS: Oral contrast material again noted within dilated small bowel loops. No definite visible colonic contrast although the contrast is severely diluted at this point and difficult to visualize. Contrast material within the pelvis likely within the bladder from prior IV contrast administration. No free air organomegaly.  IMPRESSION: Dilute contrast appears to remain in dilated small bowel loops compatible with small bowel obstruction. No visible change. Electronically Signed   By: Rolm Baptise M.D.   On: 11/07/2017 09:10   Dg Abd Portable 1v-small Bowel Obstruction Protocol-initial, 8 Hr Delay  Result Date: 11/06/2017 CLINICAL DATA:  Evaluate small bowel obstruction. EXAM: PORTABLE ABDOMEN - 1 VIEW COMPARISON:  None. FINDINGS: Films are taken approximately 8 hours after patient ingested oral contrast. Contrast is noted within multiple dilated small bowel loops. No definite oral contrast noted within the colon. Bladder contrast from recent CT  identified. IMPRESSION: Oral contrast noted within multiple dilated small bowel loops. No definite oral contrast noted within the colon. This is compatible with a high-grade small bowel obstruction given CT findings. Electronically Signed   By: Margarette Canada M.D.   On: 11/06/2017 22:05   Dg [name] Dys Tube Plc W/fl W/rad  Result Date: 11/06/2017 CLINICAL DATA:  84 year old male with small bowel obstruction. Esophageal cancer post resection. Subsequent encounter. EXAM: NASO G TUBE PLACEMENT WITH FL AND WITH RAD FLUOROSCOPY TIME:  Fluoroscopy Time:  16 seconds Radiation Exposure Index: 4.47 mGy. COMPARISON:  11/06/2017 CT. FINDINGS: Nasogastric tube gently advanced into the lower cervical esophagus. Patient would not tolerate advancement of nasogastric tube and study was terminated. IMPRESSION: Unsuccessful nasogastric tube placement. Electronically Signed   By: Genia Del M.D.   On: 11/06/2017 16:43      Scheduled Meds: . enoxaparin (LOVENOX) injection  40 mg Subcutaneous Daily  . metoprolol tartrate  10 mg Intravenous Q6H   Continuous Infusions: . sodium chloride 125 mL/hr at 11/07/17 0722  . famotidine (PEPCID) IV Stopped (11/07/17 1008)     LOS: 1 day    Time spent in minutes: 35    Debbe Odea, MD [facility] Pager: [email or web address] Password [email or web address] 11/07/2017, 1:07 PM

## 2017-11-07 NOTE — Progress Notes (Signed)
Pt on SBO protocol; had order to drank gastrografin at 5am this morning but already drank on 7/1 at noon and had follow up x-ray. Was unsure as to why second order was placed; called on call surgery MD to clarify. Verbal order given to not give 5am dose and MD to follow up in am.

## 2017-11-07 NOTE — Progress Notes (Addendum)
[facility] Progress Note     Subjective: CC: SBO Patient states he feels better currently, just received pain medication. Drank contrast yesterday, had nausea and vomiting overnight. No flatus or BM. Does not feel like belly is more/less distended. Wanting a laxative.   Objective: Vital signs in last 24 hours: Temp:  [98.3 F (36.8 C)-98.5 F (36.9 C)] 98.5 F (36.9 C) (07/02 0427) Pulse Rate:  [62-78] 68 (07/02 0427) Resp:  [14-19] 19 (07/02 0427) BP: (147-192)/(80-92) 186/80 (07/02 0427) SpO2:  [96 %-100 %] 99 % (07/02 0427) Last BM Date: 11/05/17(small, "pebbles")  Intake/Output from previous day: 07/01 0701 - 07/02 0700 In: 1171.3 [I.V.:1171.3] Out: 900 [Urine:300; Emesis/NG output:600] Intake/Output this shift: No intake/output data recorded.  PE: Gen:  Alert, NAD, pleasant Card:  Regular rate and rhythm, pedal pulses 2+ BL Pulm:  Normal effort, clear to auscultation bilaterally Abd: Soft, non-tender, mildly distended, bowel sounds hypoactive, no HSM, midline surgical scar Skin: warm and dry, no rashes  Psych: A&Ox3   Lab Results:  Recent Labs    11/06/17 0735 11/07/17 0548  WBC 12.9* 14.4*  HGB 14.8 16.1  HCT 42.8 47.1  PLT 304 238   BMET Recent Labs    11/06/17 0735 11/07/17 0548  NA 133* 139  K 4.2 5.2*  CL 96* 98  CO2 25 24  GLUCOSE 129* 112*  BUN 11 14  CREATININE 0.90 0.96  CALCIUM 9.1 9.5   PT/INR No results for input(s): LABPROT, INR in the last 72 hours. CMP     Component Value Date/Time   NA 139 11/07/2017 0548   K 5.2 (H) 11/07/2017 0548   CL 98 11/07/2017 0548   CO2 24 11/07/2017 0548   GLUCOSE 112 (H) 11/07/2017 0548   BUN 14 11/07/2017 0548   CREATININE 0.96 11/07/2017 0548   CREATININE 1.13 12/07/2012 1147   CALCIUM 9.5 11/07/2017 0548   PROT 7.6 11/07/2017 0548   ALBUMIN 4.3 11/07/2017 0548   AST 28 11/07/2017 0548   ALT 12 11/07/2017 0548   ALKPHOS 69 11/07/2017 0548   BILITOT 2.0 (H) 11/07/2017 0548    GFRNONAA >60 11/07/2017 0548   GFRAA >60 11/07/2017 0548   Lipase     Component Value Date/Time   LIPASE 22 11/06/2017 0735       Studies/Results: Ct Abdomen Pelvis W Contrast  Result Date: 11/06/2017 CLINICAL DATA:  Abdominal pain, constipation, distention history of esophageal cancer EXAM: CT ABDOMEN AND PELVIS WITH CONTRAST TECHNIQUE: Multidetector CT imaging of the abdomen and pelvis was performed using the standard protocol following bolus administration of intravenous contrast. CONTRAST:  130mL ISOVUE-300 IOPAMIDOL (ISOVUE-300) INJECTION 61% COMPARISON:  02/24/2017 FINDINGS: Lower chest: Changes of prior soft ectomy and gastric pull-through again noted, stable. Scarring in the lung bases. No effusions. Mild cardiomegaly. Hepatobiliary: Layering stones within the gallbladder. No focal hepatic abnormality. Pancreas: No focal abnormality or ductal dilatation. Spleen: No focal abnormality.  Normal size. Adrenals/Urinary Tract: No adrenal abnormality. No focal renal abnormality. No stones or hydronephrosis. Urinary bladder is unremarkable. Stomach/Bowel: Dilated fluid-filled small bowel loops are noted in the abdomen and pelvis. Distal small bowel loops are decompressed. Findings compatible with small-bowel obstruction. Transition point appears to be in the midline of the pelvis on image 44. Normal appendix. Descending colonic and sigmoid diverticulosis. Vascular/Lymphatic: Diffuse aortic calcifications. No evidence of aneurysm or adenopathy. Reproductive: Mild prostate enlargement. Other: Small amount of free fluid in the pelvis.  No free air. Musculoskeletal: No acute bony abnormality. IMPRESSION: Small  bowel obstruction with transition noted in the mid to distal small bowel in the midline of the pelvis. Cholelithiasis. Changes of esophagectomy and gastric pull-through. Cardiomegaly.  Scarring in the lung bases. Left colonic diverticulosis. Small amount of free fluid in the pelvis. Prostate  enlargement. Electronically Signed   By: Rolm Baptise M.D.   On: 11/06/2017 08:45   Dg Abd Portable 1v-small Bowel Obstruction Protocol-initial, 8 Hr Delay  Result Date: 11/06/2017 CLINICAL DATA:  Evaluate small bowel obstruction. EXAM: PORTABLE ABDOMEN - 1 VIEW COMPARISON:  None. FINDINGS: Films are taken approximately 8 hours after patient ingested oral contrast. Contrast is noted within multiple dilated small bowel loops. No definite oral contrast noted within the colon. Bladder contrast from recent CT identified. IMPRESSION: Oral contrast noted within multiple dilated small bowel loops. No definite oral contrast noted within the colon. This is compatible with a high-grade small bowel obstruction given CT findings. Electronically Signed   By: Margarette Canada M.D.   On: 11/06/2017 22:05   Dg [name] Dys Tube Plc W/fl W/rad  Result Date: 11/06/2017 CLINICAL DATA:  84 year old male with small bowel obstruction. Esophageal cancer post resection. Subsequent encounter. EXAM: NASO G TUBE PLACEMENT WITH FL AND WITH RAD FLUOROSCOPY TIME:  Fluoroscopy Time:  16 seconds Radiation Exposure Index: 4.47 mGy. COMPARISON:  11/06/2017 CT. FINDINGS: Nasogastric tube gently advanced into the lower cervical esophagus. Patient would not tolerate advancement of nasogastric tube and study was terminated. IMPRESSION: Unsuccessful nasogastric tube placement. Electronically Signed   By: Genia Del M.D.   On: 11/06/2017 16:43    Anti-infectives: Anti-infectives (From admission, onward)   None       Assessment/Plan Hx of esophageal CA - s/p esophagectomy 2001 HTN T2DM HLD  BPH  SBO - CT: Dilated fluid-filled small bowel loops are noted in the abdomen and pelvis. Distal small bowel loops are decompressed. Findings compatible with small-bowel obstruction. Transition point appears to be in the midline of the pelvis - NGT unable to be placed in radiology - KUB this AM with dilated loops small bowel and no contrast in colon  - repeat film tomorrow AM - ambulate and try dulcolax suppository today  - we will follow Hyperkalemia - K 5.2, changed IVF, recheck in AM  FEN: NPO, IVF;  VTE: SCDs ID: No abx indicated    LOS: 1 day    Brigid Re , [facility] 11/07/2017, 8:56 AM Pager: [phone] Consults: [phone] Mon-Fri 7:00 am-4:30 pm Sat-Sun 7:00 am-11:30 am

## 2017-11-08 ENCOUNTER — Inpatient Hospital Stay (HOSPITAL_COMMUNITY): Payer: Medicare Other

## 2017-11-08 ENCOUNTER — Encounter (HOSPITAL_COMMUNITY)
Admission: EM | Disposition: A | Discharge status: Home Health | Payer: Self-pay | Source: Home / Self Care | Attending: Internal Medicine

## 2017-11-08 LAB — BASIC METABOLIC PANEL
ANION GAP: 17 — AB (ref 5–15)
BUN: 22 mg/dL (ref 8–23)
CALCIUM: 9.3 mg/dL (ref 8.9–10.3)
CO2: 22 mmol/L (ref 22–32)
Chloride: 102 mmol/L (ref 98–111)
Creatinine, Ser: 0.96 mg/dL (ref 0.61–1.24)
GFR calc Af Amer: >60 mL/min (ref >60)
GLUCOSE: 147 mg/dL — AB (ref 70–99)
Potassium: 4.1 mmol/L (ref 3.5–5.1)
Sodium: 141 mmol/L (ref 135–145)

## 2017-11-08 LAB — GLUCOSE, CAPILLARY: GLUCOSE-CAPILLARY: 136 mg/dL — AB (ref 70–99)

## 2017-11-08 SURGERY — LAPAROTOMY, EXPLORATORY
Anesthesia: General

## 2017-11-08 MED ORDER — FLUCONAZOLE 100MG IVPB
100.0000 mg | INTRAVENOUS | Status: DC
  Administered 2017-11-08 – 2017-11-09 (×2): 100 mg via INTRAVENOUS
  Filled 2017-11-08 (×3): qty 50

## 2017-11-08 MED ORDER — FAMOTIDINE IN NACL 20-0.9 MG/50ML-% IV SOLN
20.0000 mg | Freq: Two times a day (BID) | INTRAVENOUS | Status: DC
  Administered 2017-11-08 – 2017-11-14 (×12): 20 mg via INTRAVENOUS
  Filled 2017-11-08 (×12): qty 50

## 2017-11-08 NOTE — Progress Notes (Signed)
[name]    Progress Note  Oscar [name]  JME:[id number] DOB: 1932/08/15 DOA: 11/06/2017 PCP: Deland Pretty, MD     Brief Narrative:   Oscar [name] is an 84 y.o. male past medical history of hypertension esophageal cancer in remission presents to the emergency room complaining of acute abdominal pain nausea and vomiting was diagnosed with an SBO  Assessment/Plan:   SBO (small bowel obstruction) ([place]) Unable to place NG tube. Abdominal x-ray reveals that there is no contrast in the colon. Getting Dulcolax suppository, continue to encourage ambulation. Surgery on board, patient refused surgery repeat labs and x-ray in the morning. He relates he is passing gas.  Mild hyperkalemia: Improved today with IV fluid hydration.  Essential HTN (hypertension) BP stable continue IV Lopressor and hydralazine as needed.  OSA (obstructive sleep apnea) Continue CPAP at night  Dysphagia: I do not see any oral thrush on physical examination, will start him on oral Diflucan. Once his small bowel resolved if dysphagia is not better we will have to call GI as he does have a history of esophageal cancer in 2001.  DVT prophylaxis: lovenox Family Communication:none Disposition Plan/Barrier to D/C: unable to determine Code Status:     Code Status Orders  (From admission, onward)        Start     Ordered   11/06/17 0937  Full code  Continuous     11/06/17 0939    Code Status History    Date Active Date Inactive Code Status Order ID Comments User Context   02/12/2017 0504 02/15/2017 1959 Full Code [id number]  Toy Baker, MD Inpatient   01/29/2017 1610 02/03/2017 1941 Full Code [id number]  Louellen Molder, MD Inpatient   10/31/2016 0301 11/02/2016 1945 Full Code [id number]  [name], MD ED        IV Access:    Peripheral IV   Procedures and diagnostic studies:   Ct Abdomen Pelvis W Contrast  Result Date: 11/06/2017 CLINICAL DATA:   Abdominal pain, constipation, distention history of esophageal cancer EXAM: CT ABDOMEN AND PELVIS WITH CONTRAST TECHNIQUE: Multidetector CT imaging of the abdomen and pelvis was performed using the standard protocol following bolus administration of intravenous contrast. CONTRAST:  134mL ISOVUE-300 IOPAMIDOL (ISOVUE-300) INJECTION 61% COMPARISON:  02/24/2017 FINDINGS: Lower chest: Changes of prior soft ectomy and gastric pull-through again noted, stable. Scarring in the lung bases. No effusions. Mild cardiomegaly. Hepatobiliary: Layering stones within the gallbladder. No focal hepatic abnormality. Pancreas: No focal abnormality or ductal dilatation. Spleen: No focal abnormality.  Normal size. Adrenals/Urinary Tract: No adrenal abnormality. No focal renal abnormality. No stones or hydronephrosis. Urinary bladder is unremarkable. Stomach/Bowel: Dilated fluid-filled small bowel loops are noted in the abdomen and pelvis. Distal small bowel loops are decompressed. Findings compatible with small-bowel obstruction. Transition point appears to be in the midline of the pelvis on image 44. Normal appendix. Descending colonic and sigmoid diverticulosis. Vascular/Lymphatic: Diffuse aortic calcifications. No evidence of aneurysm or adenopathy. Reproductive: Mild prostate enlargement. Other: Small amount of free fluid in the pelvis.  No free air. Musculoskeletal: No acute bony abnormality. IMPRESSION: Small bowel obstruction with transition noted in the mid to distal small bowel in the midline of the pelvis. Cholelithiasis. Changes of esophagectomy and gastric pull-through. Cardiomegaly.  Scarring in the lung bases. Left colonic diverticulosis. Small amount of free fluid in the pelvis. Prostate enlargement. Electronically Signed   By: Rolm Baptise M.D.   On: 11/06/2017 08:45   Dg Abd Portable 1v  Result Date: 11/08/2017 CLINICAL DATA:  Small bowel obstruction EXAM: PORTABLE ABDOMEN - 1 VIEW COMPARISON:  Yesterday and CT from 2  days ago FINDINGS: Fluid-filled small bowel throughout the abdomen, unchanged. No visible colonic contrast. IMPRESSION: Ongoing small bowel obstruction with unchanged diffuse fluid distended bowel loops. Electronically Signed   By: Monte Fantasia M.D.   On: 11/08/2017 07:34   Dg Abd Portable 1v  Result Date: 11/07/2017 CLINICAL DATA:  Abdominal pain, distention EXAM: PORTABLE ABDOMEN - 1 VIEW COMPARISON:  11/06/2017 FINDINGS: Oral contrast material again noted within dilated small bowel loops. No definite visible colonic contrast although the contrast is severely diluted at this point and difficult to visualize. Contrast material within the pelvis likely within the bladder from prior IV contrast administration. No free air organomegaly. IMPRESSION: Dilute contrast appears to remain in dilated small bowel loops compatible with small bowel obstruction. No visible change. Electronically Signed   By: Rolm Baptise M.D.   On: 11/07/2017 09:10   Dg Abd Portable 1v-small Bowel Obstruction Protocol-initial, 8 Hr Delay  Result Date: 11/06/2017 CLINICAL DATA:  Evaluate small bowel obstruction. EXAM: PORTABLE ABDOMEN - 1 VIEW COMPARISON:  None. FINDINGS: Films are taken approximately 8 hours after patient ingested oral contrast. Contrast is noted within multiple dilated small bowel loops. No definite oral contrast noted within the colon. Bladder contrast from recent CT identified. IMPRESSION: Oral contrast noted within multiple dilated small bowel loops. No definite oral contrast noted within the colon. This is compatible with a high-grade small bowel obstruction given CT findings. Electronically Signed   By: Margarette Canada M.D.   On: 11/06/2017 22:05   Dg [name] Dys Tube Plc W/fl W/rad  Result Date: 11/06/2017 CLINICAL DATA:  84 year old male with small bowel obstruction. Esophageal cancer post resection. Subsequent encounter. EXAM: NASO G TUBE PLACEMENT WITH FL AND WITH RAD FLUOROSCOPY TIME:  Fluoroscopy Time:  16 seconds  Radiation Exposure Index: 4.47 mGy. COMPARISON:  11/06/2017 CT. FINDINGS: Nasogastric tube gently advanced into the lower cervical esophagus. Patient would not tolerate advancement of nasogastric tube and study was terminated. IMPRESSION: Unsuccessful nasogastric tube placement. Electronically Signed   By: Genia Del M.D.   On: 11/06/2017 16:43     Medical Consultants:    None.  Anti-Infectives:   none  Subjective:    Erique Kaser Faerber surgery saw the patient and patient refused surgery  Objective:    Vitals:   11/07/17 1825 11/07/17 1857 11/07/17 1858 11/08/17 0600  BP: (!) 206/90 (!) 189/90 (!) 173/90 (!) 178/92  Pulse: 80 (!) 58 70 (!) 107  Resp:    20  Temp:    98.5 F (36.9 C)  TempSrc:      SpO2:    97%  Weight:      Height:        Intake/Output Summary (Last 24 hours) at 11/08/2017 0745 Last data filed at 11/08/2017 0010 Gross per 24 hour  Intake 1007.92 ml  Output 350 ml  Net 657.92 ml   Filed Weights   11/06/17 0651  Weight: 88.9 kg (196 lb)    Exam: General exam: In no acute distress. Respiratory system: Good air movement and clear to auscultation. Cardiovascular system: S1 & S2 heard, RRR.  Gastrointestinal system: Abdomen is nondistended, soft and nontender.  Central nervous system: Alert and oriented. No focal neurological deficits. Extremities: No pedal edema. Skin: No rashes, lesions or ulcers Psychiatry: Judgement and insight appear normal. Mood & affect appropriate.    Data Reviewed:  Labs: Basic Metabolic Panel: Recent Labs  Lab 11/06/17 0735 11/07/17 0548 11/08/17 0540  NA 133* 139 141  K 4.2 5.2* 4.1  CL 96* 98 102  CO2 25 24 22   GLUCOSE 129* 112* 147*  BUN 11 14 22   CREATININE 0.90 0.96 0.96  CALCIUM 9.1 9.5 9.3  MG  --  1.8  --    GFR Estimated Creatinine Clearance: 69 mL/min (by C-G formula based on SCr of 0.96 mg/dL). Liver Function Tests: Recent Labs  Lab 11/06/17 0735 11/07/17 0548  AST 23 28  ALT 13 12    ALKPHOS 71 69  BILITOT 1.8* 2.0*  PROT 6.9 7.6  ALBUMIN 4.2 4.3   Recent Labs  Lab 11/06/17 0735  LIPASE 22   No results for input(s): AMMONIA in the last 168 hours. Coagulation profile No results for input(s): INR, PROTIME in the last 168 hours.  CBC: Recent Labs  Lab 11/06/17 0735 11/07/17 0548  WBC 12.9* 14.4*  HGB 14.8 16.1  HCT 42.8 47.1  MCV 94.7 94.4  PLT 304 238   Cardiac Enzymes: No results for input(s): CKTOTAL, CKMB, CKMBINDEX, TROPONINI in the last 168 hours. BNP (last 3 results) No results for input(s): PROBNP in the last 8760 hours. CBG: No results for input(s): GLUCAP in the last 168 hours. D-Dimer: No results for input(s): DDIMER in the last 72 hours. Hgb A1c: No results for input(s): HGBA1C in the last 72 hours. Lipid Profile: No results for input(s): CHOL, HDL, LDLCALC, TRIG, CHOLHDL, LDLDIRECT in the last 72 hours. Thyroid function studies: No results for input(s): TSH, T4TOTAL, T3FREE, THYROIDAB in the last 72 hours.  Invalid input(s): FREET3 Anemia work up: No results for input(s): VITAMINB12, FOLATE, FERRITIN, TIBC, IRON, RETICCTPCT in the last 72 hours. Sepsis Labs: Recent Labs  Lab 11/06/17 0735 11/07/17 0548  WBC 12.9* 14.4*   Microbiology No results found for this or any previous visit (from the past 240 hour(s)).   Medications:   . enoxaparin (LOVENOX) injection  40 mg Subcutaneous Daily  . metoprolol tartrate  10 mg Intravenous Q6H   Continuous Infusions: . sodium chloride 100 mL/hr at 11/07/17 1830  . famotidine (PEPCID) IV Stopped (11/07/17 1008)      LOS: 2 days   [facility] Pager [phone]  *Please refer to [email or web address], password [email or web address] to get updated schedule on who will round on this patient, as hospitalists switch teams weekly. If 7PM-7AM, please contact night-coverage at [email or web address], password [email or web address] for any overnight needs.  11/08/2017, 7:45 AM

## 2017-11-08 NOTE — Progress Notes (Signed)
Subjective/Chief Complaint: Says he feels better even though he vomited overnight. Says he has had flatus and bm's   Objective: Vital signs in last 24 hours: Temp:  [98.3 F (36.8 C)-98.5 F (36.9 C)] 98.5 F (36.9 C) (07/03 0600) Pulse Rate:  [58-107] 107 (07/03 0600) Resp:  [16-20] 20 (07/03 0600) BP: (157-206)/(82-92) 178/92 (07/03 0600) SpO2:  [97 %-99 %] 97 % (07/03 0600) Last BM Date: 11/05/17(small, "pebbles")  Intake/Output from previous day: 07/02 0701 - 07/03 0700 In: 1007.9 [I.V.:916.3; IV Piggyback:91.7] Out: 350 [Urine:350] Intake/Output this shift: No intake/output data recorded.  General appearance: alert and cooperative Resp: clear to auscultation bilaterally Cardio: regular rate and rhythm GI: soft, less tender  Lab Results:  Recent Labs    11/06/17 0735 11/07/17 0548  WBC 12.9* 14.4*  HGB 14.8 16.1  HCT 42.8 47.1  PLT 304 238   BMET Recent Labs    11/07/17 0548 11/08/17 0540  NA 139 141  K 5.2* 4.1  CL 98 102  CO2 24 22  GLUCOSE 112* 147*  BUN 14 22  CREATININE 0.96 0.96  CALCIUM 9.5 9.3   PT/INR No results for input(s): LABPROT, INR in the last 72 hours. ABG No results for input(s): PHART, HCO3 in the last 72 hours.  Invalid input(s): PCO2, PO2  Studies/Results: Dg Abd Portable 1v  Result Date: 11/08/2017 CLINICAL DATA:  Small bowel obstruction EXAM: PORTABLE ABDOMEN - 1 VIEW COMPARISON:  Yesterday and CT from 2 days ago FINDINGS: Fluid-filled small bowel throughout the abdomen, unchanged. No visible colonic contrast. IMPRESSION: Ongoing small bowel obstruction with unchanged diffuse fluid distended bowel loops. Electronically Signed   By: Monte Fantasia M.D.   On: 11/08/2017 07:34   Dg Abd Portable 1v  Result Date: 11/07/2017 CLINICAL DATA:  Abdominal pain, distention EXAM: PORTABLE ABDOMEN - 1 VIEW COMPARISON:  11/06/2017 FINDINGS: Oral contrast material again noted within dilated small bowel loops. No definite visible  colonic contrast although the contrast is severely diluted at this point and difficult to visualize. Contrast material within the pelvis likely within the bladder from prior IV contrast administration. No free air organomegaly. IMPRESSION: Dilute contrast appears to remain in dilated small bowel loops compatible with small bowel obstruction. No visible change. Electronically Signed   By: Rolm Baptise M.D.   On: 11/07/2017 09:10   Dg Abd Portable 1v-small Bowel Obstruction Protocol-initial, 8 Hr Delay  Result Date: 11/06/2017 CLINICAL DATA:  Evaluate small bowel obstruction. EXAM: PORTABLE ABDOMEN - 1 VIEW COMPARISON:  None. FINDINGS: Films are taken approximately 8 hours after patient ingested oral contrast. Contrast is noted within multiple dilated small bowel loops. No definite oral contrast noted within the colon. Bladder contrast from recent CT identified. IMPRESSION: Oral contrast noted within multiple dilated small bowel loops. No definite oral contrast noted within the colon. This is compatible with a high-grade small bowel obstruction given CT findings. Electronically Signed   By: Margarette Canada M.D.   On: 11/06/2017 22:05   Dg [name] Dys Tube Plc W/fl W/rad  Result Date: 11/06/2017 CLINICAL DATA:  84 year old male with small bowel obstruction. Esophageal cancer post resection. Subsequent encounter. EXAM: NASO G TUBE PLACEMENT WITH FL AND WITH RAD FLUOROSCOPY TIME:  Fluoroscopy Time:  16 seconds Radiation Exposure Index: 4.47 mGy. COMPARISON:  11/06/2017 CT. FINDINGS: Nasogastric tube gently advanced into the lower cervical esophagus. Patient would not tolerate advancement of nasogastric tube and study was terminated. IMPRESSION: Unsuccessful nasogastric tube placement. Electronically Signed   By: Genia Del  M.D.   On: 11/06/2017 16:43    Anti-infectives: Anti-infectives (From admission, onward)   None      Assessment/Plan: s/p Procedure(s): EXPLORATORY LAPAROTOMY LYSIS OF ADHESIONS  (N/A) continue bowel rest  I recommended surgery today for sbo but pt declines.  Recheck abd xrays tomorrow  LOS: 2 days    TOTH III,Syrina Wake S 11/08/2017

## 2017-11-09 ENCOUNTER — Inpatient Hospital Stay (HOSPITAL_COMMUNITY): Payer: Medicare Other

## 2017-11-09 MED ORDER — FLEET ENEMA 7-19 GM/118ML RE ENEM
1.0000 | ENEMA | Freq: Once | RECTAL | Status: AC
  Administered 2017-11-09: 1 via RECTAL
  Filled 2017-11-09: qty 1

## 2017-11-09 MED ORDER — BISACODYL 10 MG RE SUPP
10.0000 mg | Freq: Every day | RECTAL | Status: DC | PRN
  Administered 2017-11-09: 10 mg via RECTAL
  Filled 2017-11-09 (×2): qty 1

## 2017-11-09 NOTE — Progress Notes (Signed)
General Surgery [facility]  Assessment & Plan: Small bowel obstruction, hx of esophageal surgery 84 yrs ago at [facility]  Patient states passing gas this AM, wants clear liquid diet  Will administer Fleets enema this AM  Repeat AXR in AM 7/5  If no improvement, may require laparotomy next 24-48 hours        Earnstine Regal, MD, [facility]       Office: [phone]    Chief Complaint: Small bowel obstruction  Subjective: Patient up at bedside, states passing gas this AM.  Wants clear liquids to drink this AM.  Objective: Vital signs in last 24 hours: Temp:  [97.7 F (36.5 C)-98 F (36.7 C)] 97.7 F (36.5 C) (07/04 0409) Pulse Rate:  [76-88] 88 (07/04 0409) Resp:  [16-20] 20 (07/04 0409) BP: (160-193)/(82-98) 165/97 (07/04 0409) SpO2:  [95 %-97 %] 97 % (07/04 0409) Last BM Date: 11/08/17(small)  Intake/Output from previous day: 07/03 0701 - 07/04 0700 In: 2650 [I.V.:2600; IV Piggyback:50] Out: 275 [Urine:275] Intake/Output this shift: No intake/output data recorded.  Physical Exam: HEENT - sclerae clear, mucous membranes moist Neck - soft Chest - clear bilaterally Cor - RRR Abdomen - soft, mild distension; BS present; non-tender Ext - no edema, non-tender Neuro - alert & oriented, no focal deficits  Lab Results:  Recent Labs    11/07/17 0548  WBC 14.4*  HGB 16.1  HCT 47.1  PLT 238   BMET Recent Labs    11/07/17 0548 11/08/17 0540  NA 139 141  K 5.2* 4.1  CL 98 102  CO2 24 22  GLUCOSE 112* 147*  BUN 14 22  CREATININE 0.96 0.96  CALCIUM 9.5 9.3   PT/INR No results for input(s): LABPROT, INR in the last 72 hours. Comprehensive Metabolic Panel:    Component Value Date/Time   NA 141 11/08/2017 0540   NA 139 11/07/2017 0548   K 4.1 11/08/2017 0540   K 5.2 (H) 11/07/2017 0548   CL 102 11/08/2017 0540   CL 98 11/07/2017 0548   CO2 22 11/08/2017 0540   CO2 24 11/07/2017 0548   BUN 22  11/08/2017 0540   BUN 14 11/07/2017 0548   CREATININE 0.96 11/08/2017 0540   CREATININE 0.96 11/07/2017 0548   CREATININE 1.13 12/07/2012 1147   GLUCOSE 147 (H) 11/08/2017 0540   GLUCOSE 112 (H) 11/07/2017 0548   CALCIUM 9.3 11/08/2017 0540   CALCIUM 9.5 11/07/2017 0548   AST 28 11/07/2017 0548   AST 23 11/06/2017 0735   ALT 12 11/07/2017 0548   ALT 13 11/06/2017 0735   ALKPHOS 69 11/07/2017 0548   ALKPHOS 71 11/06/2017 0735   BILITOT 2.0 (H) 11/07/2017 0548   BILITOT 1.8 (H) 11/06/2017 0735   PROT 7.6 11/07/2017 0548   PROT 6.9 11/06/2017 0735   ALBUMIN 4.3 11/07/2017 0548   ALBUMIN 4.2 11/06/2017 0735    Studies/Results: Dg Abd 2 Views  Result Date: 11/09/2017 CLINICAL DATA:  Abdominal distension and nausea EXAM: ABDOMEN - 2 VIEW COMPARISON:  11/08/2017 FINDINGS: Persistent small bowel dilatation is noted with contrast enhanced fluid throughout the small bowel structures. No perforation is noted. No free air is seen. Minimal contrast has reached the colon when compare with the prior exam. Degenerative changes of lumbar spine are noted. IMPRESSION: Persistent small bowel dilatation although some contrast has reached the proximal colon. This is consistent with a partial small bowel obstruction. Electronically  Signed   By: Inez Catalina M.D.   On: 11/09/2017 07:55   Dg Abd Portable 1v  Result Date: 11/08/2017 CLINICAL DATA:  Small bowel obstruction EXAM: PORTABLE ABDOMEN - 1 VIEW COMPARISON:  Yesterday and CT from 2 days ago FINDINGS: Fluid-filled small bowel throughout the abdomen, unchanged. No visible colonic contrast. IMPRESSION: Ongoing small bowel obstruction with unchanged diffuse fluid distended bowel loops. Electronically Signed   By: Monte Fantasia M.D.   On: 11/08/2017 07:34      Kaelea Gathright M 11/09/2017  Patient ID: [name], male   DOB: 1932-08-18, 84 y.o.   MRN: [id number]

## 2017-11-09 NOTE — Progress Notes (Signed)
[name]    Progress Note  Oscar [name]  ZSW:[id number] DOB: [birth year] DOA: 11/06/2017 PCP: Deland Pretty, MD     Brief Narrative:   Oscar [name] is an 84 y.o. male past medical history of hypertension esophageal cancer in remission presents to the emergency room complaining of acute abdominal pain nausea and vomiting was diagnosed with an SBO  Assessment/Plan:   SBO (small bowel obstruction) ([place]) Abdominal x-ray revealed there is some contrast in the proximal colon Continue Dulcolax suppository, continue to encourage ambulation. In gas had a bowel movement yesterday.  Mild hyperkalemia: Resolved with IV fluid hydration.  Essential HTN (hypertension) Pressure slightly high today we will continue IV Lopressor and hydralazine. Once able to take orals will resume his home antihypertensive medication.  OSA (obstructive sleep apnea) Continue CPAP at night  Dysphagia: I do not see any oral thrush on physical examination, will start him on oral Diflucan. He relates his dysphagia is resolved he is tolerating ice chips regularly.  We will see how he does when we have answers diet, per surgery.  DVT prophylaxis: lovenox Family Communication:none Disposition Plan/Barrier to D/C: unable to determine Code Status:     Code Status Orders  (From admission, onward)        Start     Ordered   11/06/17 0937  Full code  Continuous     11/06/17 0939    Code Status History    Date Active Date Inactive Code Status Order ID Comments User Context   02/12/2017 0504 02/15/2017 1959 Full Code [id number]  Toy Baker, MD Inpatient   01/29/2017 1610 02/03/2017 1941 Full Code [id number]  Louellen Molder, MD Inpatient   10/31/2016 0301 11/02/2016 1945 Full Code [id number]  [name], MD ED        IV Access:    Peripheral IV   Procedures and diagnostic studies:   Dg Abd 2 Views  Result Date: 11/09/2017 CLINICAL DATA:  Abdominal distension  and nausea EXAM: ABDOMEN - 2 VIEW COMPARISON:  11/08/2017 FINDINGS: Persistent small bowel dilatation is noted with contrast enhanced fluid throughout the small bowel structures. No perforation is noted. No free air is seen. Minimal contrast has reached the colon when compare with the prior exam. Degenerative changes of lumbar spine are noted. IMPRESSION: Persistent small bowel dilatation although some contrast has reached the proximal colon. This is consistent with a partial small bowel obstruction. Electronically Signed   By: Inez Catalina M.D.   On: 11/09/2017 07:55   Dg Abd Portable 1v  Result Date: 11/08/2017 CLINICAL DATA:  Small bowel obstruction EXAM: PORTABLE ABDOMEN - 1 VIEW COMPARISON:  Yesterday and CT from 2 days ago FINDINGS: Fluid-filled small bowel throughout the abdomen, unchanged. No visible colonic contrast. IMPRESSION: Ongoing small bowel obstruction with unchanged diffuse fluid distended bowel loops. Electronically Signed   By: Monte Fantasia M.D.   On: 11/08/2017 07:34     Medical Consultants:    None.  Anti-Infectives:   none  Subjective:    Oscar [name] patient relates he had a bowel movement yesterday, is passing a lot of gas.  Objective:    Vitals:   11/08/17 1434 11/08/17 1742 11/08/17 2044 11/09/17 0409  BP: (!) 160/94 (!) 193/82 (!) 172/98 (!) 165/97  Pulse: 80 76 79 88  Resp: 16  18 20   Temp: 97.8 F (36.6 C)  98 F (36.7 C) 97.7 F (36.5 C)  TempSrc: Oral  Oral Oral  SpO2: 96%  95%  97%  Weight:      Height:        Intake/Output Summary (Last 24 hours) at 11/09/2017 0818 Last data filed at 11/08/2017 1700 Gross per 24 hour  Intake 2650 ml  Output 275 ml  Net 2375 ml   Filed Weights   11/06/17 0651  Weight: 88.9 kg (196 lb)    Exam: General exam: In no acute distress. Respiratory system: Good air movement and clear to auscultation. Cardiovascular system: S1 & S2 heard, RRR.  Gastrointestinal system: Abdomen is nondistended, soft and  nontender.  Central nervous system: Alert and oriented. No focal neurological deficits. Extremities: No pedal edema. Skin: No rashes, lesions or ulcers Psychiatry: Judgement and insight appear normal. Mood & affect appropriate.    Data Reviewed:    Labs: Basic Metabolic Panel: Recent Labs  Lab 11/06/17 0735 11/07/17 0548 11/08/17 0540  NA 133* 139 141  K 4.2 5.2* 4.1  CL 96* 98 102  CO2 25 24 22   GLUCOSE 129* 112* 147*  BUN 11 14 22   CREATININE 0.90 0.96 0.96  CALCIUM 9.1 9.5 9.3  MG  --  1.8  --    GFR Estimated Creatinine Clearance: 69 mL/min (by C-G formula based on SCr of 0.96 mg/dL). Liver Function Tests: Recent Labs  Lab 11/06/17 0735 11/07/17 0548  AST 23 28  ALT 13 12  ALKPHOS 71 69  BILITOT 1.8* 2.0*  PROT 6.9 7.6  ALBUMIN 4.2 4.3   Recent Labs  Lab 11/06/17 0735  LIPASE 22   No results for input(s): AMMONIA in the last 168 hours. Coagulation profile No results for input(s): INR, PROTIME in the last 168 hours.  CBC: Recent Labs  Lab 11/06/17 0735 11/07/17 0548  WBC 12.9* 14.4*  HGB 14.8 16.1  HCT 42.8 47.1  MCV 94.7 94.4  PLT 304 238   Cardiac Enzymes: No results for input(s): CKTOTAL, CKMB, CKMBINDEX, TROPONINI in the last 168 hours. BNP (last 3 results) No results for input(s): PROBNP in the last 8760 hours. CBG: Recent Labs  Lab 11/08/17 2046  GLUCAP 136*   D-Dimer: No results for input(s): DDIMER in the last 72 hours. Hgb A1c: No results for input(s): HGBA1C in the last 72 hours. Lipid Profile: No results for input(s): CHOL, HDL, LDLCALC, TRIG, CHOLHDL, LDLDIRECT in the last 72 hours. Thyroid function studies: No results for input(s): TSH, T4TOTAL, T3FREE, THYROIDAB in the last 72 hours.  Invalid input(s): FREET3 Anemia work up: No results for input(s): VITAMINB12, FOLATE, FERRITIN, TIBC, IRON, RETICCTPCT in the last 72 hours. Sepsis Labs: Recent Labs  Lab 11/06/17 0735 11/07/17 0548  WBC 12.9* 14.4*    Microbiology No results found for this or any previous visit (from the past 240 hour(s)).   Medications:   . enoxaparin (LOVENOX) injection  40 mg Subcutaneous Daily  . metoprolol tartrate  10 mg Intravenous Q6H   Continuous Infusions: . sodium chloride 100 mL/hr at 11/09/17 0401  . famotidine (PEPCID) IV Stopped (11/09/17 0039)  . fluconazole (DIFLUCAN) IV Stopped (11/08/17 1417)      LOS: 3 days   [facility] Pager [phone]  *Please refer to [email or web address], password [email or web address] to get updated schedule on who will round on this patient, as hospitalists switch teams weekly. If 7PM-7AM, please contact night-coverage at [email or web address], password [email or web address] for any overnight needs.  11/09/2017, 8:18 AM

## 2017-11-10 ENCOUNTER — Inpatient Hospital Stay (HOSPITAL_COMMUNITY): Payer: Medicare Other

## 2017-11-10 NOTE — Progress Notes (Signed)
[name]    Progress Note  Oscar [name]  LZJ:[id number] DOB: 1932-06-08 DOA: 11/06/2017 PCP: Deland Pretty, MD     Brief Narrative:   Oscar [name] is an 84 y.o. male past medical history of hypertension esophageal cancer in remission presents to the emergency room complaining of acute abdominal pain nausea and vomiting was diagnosed with an SBO  Assessment/Plan:   SBO (small bowel obstruction) ([place]) Abdominal x-ray revealed is pending, he has not had a bowel movement he vomited twice Continue Dulcolax suppository, continue to encourage ambulation.  Mild hyperkalemia: Resolved with IV fluid hydration.  Essential HTN (hypertension) Pressure slightly high today we will continue IV Lopressor and hydralazine. Once able to take orals will resume his home antihypertensive medication.  OSA (obstructive sleep apnea) Continue CPAP at night  Dysphagia: Now resolved, continue Diflucan.  DVT prophylaxis: lovenox Family Communication:none Disposition Plan/Barrier to D/C: unable to determine Code Status:     Code Status Orders  (From admission, onward)        Start     Ordered   11/06/17 0937  Full code  Continuous     11/06/17 0939    Code Status History    Date Active Date Inactive Code Status Order ID Comments User Context   02/12/2017 0504 02/15/2017 1959 Full Code [id number]  Toy Baker, MD Inpatient   01/29/2017 1610 02/03/2017 1941 Full Code [id number]  Louellen Molder, MD Inpatient   10/31/2016 0301 11/02/2016 1945 Full Code [id number]  [name], MD ED        IV Access:    Peripheral IV   Procedures and diagnostic studies:   Dg Abd 2 Views  Result Date: 11/09/2017 CLINICAL DATA:  Abdominal distension and nausea EXAM: ABDOMEN - 2 VIEW COMPARISON:  11/08/2017 FINDINGS: Persistent small bowel dilatation is noted with contrast enhanced fluid throughout the small bowel structures. No perforation is noted. No free air is  seen. Minimal contrast has reached the colon when compare with the prior exam. Degenerative changes of lumbar spine are noted. IMPRESSION: Persistent small bowel dilatation although some contrast has reached the proximal colon. This is consistent with a partial small bowel obstruction. Electronically Signed   By: Inez Catalina M.D.   On: 11/09/2017 07:55     Medical Consultants:    None.  Anti-Infectives:   none  Subjective:    Oscar [name] he vomited twice yesterday has not had a bowel movement..  Objective:    Vitals:   11/09/17 1445 11/09/17 2029 11/10/17 0045 11/10/17 0516  BP: (!) 174/86 (!) 175/86  (!) 154/88  Pulse: 75 87 (!) 110 82  Resp: 20 20  18   Temp: 98.6 F (37 C) (!) 97.5 F (36.4 C)  98.6 F (37 C)  TempSrc: Oral Oral  Oral  SpO2: 98% 95%  97%  Weight:      Height:        Intake/Output Summary (Last 24 hours) at 11/10/2017 0858 Last data filed at 11/10/2017 0600 Gross per 24 hour  Intake 1430 ml  Output 100 ml  Net 1330 ml   Filed Weights   11/06/17 0651  Weight: 88.9 kg (196 lb)    Exam: General exam: In no acute distress. Respiratory system: Good air movement and clear to auscultation. Cardiovascular system: S1 & S2 heard, RRR.  Gastrointestinal system: Abdomen is nondistended, soft and nontender.  Central nervous system: Alert and oriented. No focal neurological deficits. Extremities: No pedal edema. Skin: No rashes,  lesions or ulcers Psychiatry: Judgement and insight appear normal. Mood & affect appropriate.    Data Reviewed:    Labs: Basic Metabolic Panel: Recent Labs  Lab 11/06/17 0735 11/07/17 0548 11/08/17 0540  NA 133* 139 141  K 4.2 5.2* 4.1  CL 96* 98 102  CO2 25 24 22   GLUCOSE 129* 112* 147*  BUN 11 14 22   CREATININE 0.90 0.96 0.96  CALCIUM 9.1 9.5 9.3  MG  --  1.8  --    GFR Estimated Creatinine Clearance: 69 mL/min (by C-G formula based on SCr of 0.96 mg/dL). Liver Function Tests: Recent Labs  Lab  11/06/17 0735 11/07/17 0548  AST 23 28  ALT 13 12  ALKPHOS 71 69  BILITOT 1.8* 2.0*  PROT 6.9 7.6  ALBUMIN 4.2 4.3   Recent Labs  Lab 11/06/17 0735  LIPASE 22   No results for input(s): AMMONIA in the last 168 hours. Coagulation profile No results for input(s): INR, PROTIME in the last 168 hours.  CBC: Recent Labs  Lab 11/06/17 0735 11/07/17 0548  WBC 12.9* 14.4*  HGB 14.8 16.1  HCT 42.8 47.1  MCV 94.7 94.4  PLT 304 238   Cardiac Enzymes: No results for input(s): CKTOTAL, CKMB, CKMBINDEX, TROPONINI in the last 168 hours. BNP (last 3 results) No results for input(s): PROBNP in the last 8760 hours. CBG: Recent Labs  Lab 11/08/17 2046  GLUCAP 136*   D-Dimer: No results for input(s): DDIMER in the last 72 hours. Hgb A1c: No results for input(s): HGBA1C in the last 72 hours. Lipid Profile: No results for input(s): CHOL, HDL, LDLCALC, TRIG, CHOLHDL, LDLDIRECT in the last 72 hours. Thyroid function studies: No results for input(s): TSH, T4TOTAL, T3FREE, THYROIDAB in the last 72 hours.  Invalid input(s): FREET3 Anemia work up: No results for input(s): VITAMINB12, FOLATE, FERRITIN, TIBC, IRON, RETICCTPCT in the last 72 hours. Sepsis Labs: Recent Labs  Lab 11/06/17 0735 11/07/17 0548  WBC 12.9* 14.4*   Microbiology No results found for this or any previous visit (from the past 240 hour(s)).   Medications:   . enoxaparin (LOVENOX) injection  40 mg Subcutaneous Daily  . metoprolol tartrate  10 mg Intravenous Q6H   Continuous Infusions: . sodium chloride 100 mL/hr at 11/10/17 0600  . famotidine (PEPCID) IV Stopped (11/09/17 2216)  . fluconazole (DIFLUCAN) IV Stopped (11/09/17 1246)      LOS: 4 days   [facility] Pager [phone]  *Please refer to [email or web address], password [email or web address] to get updated schedule on who will round on this patient, as hospitalists switch teams weekly. If 7PM-7AM, please contact night-coverage at  [email or web address], password [email or web address] for any overnight needs.  11/10/2017, 8:58 AM

## 2017-11-10 NOTE — Progress Notes (Signed)
   Subjective/Chief Complaint: Says he feels better. Says he is passing flatus. Nursing says he hasn't had a bm   Objective: Vital signs in last 24 hours: Temp:  [97.5 F (36.4 C)-98.6 F (37 C)] 98.6 F (37 C) (07/05 0516) Pulse Rate:  [75-110] 82 (07/05 0516) Resp:  [18-20] 18 (07/05 0516) BP: (154-175)/(86-88) 154/88 (07/05 0516) SpO2:  [95 %-98 %] 97 % (07/05 0516) Last BM Date: 11/08/17  Intake/Output from previous day: 07/04 0701 - 07/05 0700 In: 1430 [I.V.:1050; IV Piggyback:380] Out: 100 [Urine:100] Intake/Output this shift: No intake/output data recorded.  General appearance: alert and cooperative Resp: clear to auscultation bilaterally Cardio: regular rate and rhythm GI: soft, nontender. mild distension  Lab Results:  No results for input(s): WBC, HGB, HCT, PLT in the last 72 hours. BMET Recent Labs    11/08/17 0540  NA 141  K 4.1  CL 102  CO2 22  GLUCOSE 147*  BUN 22  CREATININE 0.96  CALCIUM 9.3   PT/INR No results for input(s): LABPROT, INR in the last 72 hours. ABG No results for input(s): PHART, HCO3 in the last 72 hours.  Invalid input(s): PCO2, PO2  Studies/Results: Dg Abd 2 Views  Result Date: 11/09/2017 CLINICAL DATA:  Abdominal distension and nausea EXAM: ABDOMEN - 2 VIEW COMPARISON:  11/08/2017 FINDINGS: Persistent small bowel dilatation is noted with contrast enhanced fluid throughout the small bowel structures. No perforation is noted. No free air is seen. Minimal contrast has reached the colon when compare with the prior exam. Degenerative changes of lumbar spine are noted. IMPRESSION: Persistent small bowel dilatation although some contrast has reached the proximal colon. This is consistent with a partial small bowel obstruction. Electronically Signed   By: Inez Catalina M.D.   On: 11/09/2017 07:55    Anti-infectives: Anti-infectives (From admission, onward)   Start     Dose/Rate Route Frequency Ordered Stop   11/08/17 1200   fluconazole (DIFLUCAN) IVPB 100 mg     100 mg 50 mL/hr over 60 Minutes Intravenous Every 24 hours 11/08/17 1032        Assessment/Plan: s/p Procedure(s): EXPLORATORY LAPAROTOMY LYSIS OF ADHESIONS (N/A) Pt is still refusing surgery. will recheck xrays today.   LOS: 4 days    TOTH III,Brandye Inthavong S 11/10/2017

## 2017-11-11 ENCOUNTER — Inpatient Hospital Stay (HOSPITAL_COMMUNITY): Payer: Medicare Other

## 2017-11-11 LAB — BASIC METABOLIC PANEL
Anion gap: 11 (ref 5–15)
Anion gap: 11 (ref 5–15)
BUN: 21 mg/dL (ref 8–23)
BUN: 19 mg/dL (ref 8–23)
CALCIUM: 9 mg/dL (ref 8.9–10.3)
CHLORIDE: 105 mmol/L (ref 98–111)
CHLORIDE: 107 mmol/L (ref 98–111)
CO2: 27 mmol/L (ref 22–32)
CO2: 23 mmol/L (ref 22–32)
CREATININE: 1 mg/dL (ref 0.61–1.24)
CREATININE: 0.86 mg/dL (ref 0.61–1.24)
Calcium: 8.7 mg/dL — ABNORMAL LOW (ref 8.9–10.3)
GFR calc Af Amer: >60 mL/min (ref >60)
GFR calc non Af Amer: >60 mL/min (ref >60)
GFR calc non Af Amer: >60 mL/min (ref >60)
GLUCOSE: 116 mg/dL — AB (ref 70–99)
Glucose, Bld: 106 mg/dL — ABNORMAL HIGH (ref 70–99)
Potassium: 4.2 mmol/L (ref 3.5–5.1)
Potassium: 3.7 mmol/L (ref 3.5–5.1)
Sodium: 143 mmol/L (ref 135–145)
Sodium: 141 mmol/L (ref 135–145)

## 2017-11-11 LAB — CBC WITH DIFFERENTIAL/PLATELET
Basophils Absolute: 0 10*3/uL (ref 0.0–0.1)
Basophils Relative: 0 %
EOS ABS: 0 10*3/uL (ref 0.0–0.7)
Eosinophils Relative: 0 %
HEMATOCRIT: 39.7 % (ref 39.0–52.0)
HEMOGLOBIN: 13.3 g/dL (ref 13.0–17.0)
LYMPHS ABS: 1.2 10*3/uL (ref 0.7–4.0)
Lymphocytes Relative: 13 %
MCH: 32.4 pg (ref 26.0–34.0)
MCHC: 33.5 g/dL (ref 30.0–36.0)
MCV: 96.6 fL (ref 78.0–100.0)
MONO ABS: 1.2 10*3/uL — AB (ref 0.1–1.0)
MONOS PCT: 13 %
NEUTROS ABS: 6.6 10*3/uL (ref 1.7–7.7)
NEUTROS PCT: 74 %
Platelets: 288 10*3/uL (ref 150–400)
RBC: 4.11 MIL/uL — ABNORMAL LOW (ref 4.22–5.81)
RDW: 12.4 % (ref 11.5–15.5)
WBC: 9 10*3/uL (ref 4.0–10.5)

## 2017-11-11 NOTE — Progress Notes (Signed)
[name]    Progress Note  [name]  RCV:[id number] DOB: January 24, 1933 DOA: 11/06/2017 PCP: Deland Pretty, MD     Brief Narrative:   Oscar [name] is an 84 y.o. male past medical history of hypertension esophageal cancer in remission presents to the emergency room complaining of acute abdominal pain nausea and vomiting was diagnosed with an SBO  Assessment/Plan:   SBO (small bowel obstruction) ([place]) Abdominal x-ray revealed persistent SBO, he has agreed to surgery. He has not passing gas not having bowel movement and is vomiting today.  Mild hyperkalemia: Resolved with IV fluid hydration.  Essential HTN (hypertension) N.p.o., blood pressure continues to be high continue IV hydralazine and Lopressor.  OSA (obstructive sleep apnea) Continue CPAP at night  Dysphagia: Now resolved, d/c diflucan  DVT prophylaxis: lovenox Family Communication:none Disposition Plan/Barrier to D/C: unable to determine Code Status:     Code Status Orders  (From admission, onward)        Start     Ordered   11/06/17 0937  Full code  Continuous     11/06/17 0939    Code Status History    Date Active Date Inactive Code Status Order ID Comments User Context   02/12/2017 0504 02/15/2017 1959 Full Code [id number]  Toy Baker, MD Inpatient   01/29/2017 1610 02/03/2017 1941 Full Code [id number]  Louellen Molder, MD Inpatient   10/31/2016 0301 11/02/2016 1945 Full Code [id number]  [name], MD ED        IV Access:    Peripheral IV   Procedures and diagnostic studies:   Dg Abd 2 Views  Result Date: 11/10/2017 CLINICAL DATA:  Small bowel obstruction. EXAM: ABDOMEN - 2 VIEW COMPARISON:  CT from 4 days ago. Abdominal CT from 4 days ago. Radiograph from yesterday. FINDINGS: Continued small bowel obstruction with dilated and gas/fluid-filled loops. Fluid-filled loop in the right abdomen measures up to 5.6 cm diameter. No evidence of pneumoperitoneum  or pneumatosis. Left upper quadrant clips.  Lung bases are clear.  Cardiomegaly. IMPRESSION: Continued small bowel obstruction.  Negative for pneumoperitoneum. Electronically Signed   By: Monte Fantasia M.D.   On: 11/10/2017 11:40     Medical Consultants:    None.  Anti-Infectives:   none  Subjective:    [name] he vomited twice yesterday has not had a bowel movement..  Objective:    Vitals:   11/10/17 1432 11/10/17 2100 11/11/17 0015 11/11/17 0527  BP: (!) 170/87 (!) 153/81 140/85 (!) 155/82  Pulse: 70 84 88 97  Resp: 19 17  18   Temp: 98.5 F (36.9 C) 98.6 F (37 C)  98.3 F (36.8 C)  TempSrc: Axillary Oral  Oral  SpO2: 95% 96%  99%  Weight:      Height:       No intake or output data in the 24 hours ending 11/11/17 0907 Filed Weights   11/06/17 0651  Weight: 88.9 kg (196 lb)    Exam: General exam: In no acute distress. Respiratory system: Good air movement and clear to auscultation. Cardiovascular system: S1 & S2 heard, RRR.  Gastrointestinal system: Abdomen is more distended, soft nontender Central nervous system: Alert and oriented. No focal neurological deficits. Extremities: No pedal edema. Skin: No rashes, lesions or ulcers Psychiatry: Judgement and insight appear normal. Mood & affect appropriate.    Data Reviewed:    Labs: Basic Metabolic Panel: Recent Labs  Lab 11/06/17 0735 11/07/17 0548 11/08/17 0540 11/11/17 0806  NA  133* 139 141 143  K 4.2 5.2* 4.1 4.2  CL 96* 98 102 105  CO2 25 24 22 27   GLUCOSE 129* 112* 147* 116*  BUN 11 14 22 21   CREATININE 0.90 0.96 0.96 1.00  CALCIUM 9.1 9.5 9.3 9.0  MG  --  1.8  --   --    GFR Estimated Creatinine Clearance: 66.2 mL/min (by C-G formula based on SCr of 1 mg/dL). Liver Function Tests: Recent Labs  Lab 11/06/17 0735 11/07/17 0548  AST 23 28  ALT 13 12  ALKPHOS 71 69  BILITOT 1.8* 2.0*  PROT 6.9 7.6  ALBUMIN 4.2 4.3   Recent Labs  Lab 11/06/17 0735  LIPASE 22   No  results for input(s): AMMONIA in the last 168 hours. Coagulation profile No results for input(s): INR, PROTIME in the last 168 hours.  CBC: Recent Labs  Lab 11/06/17 0735 11/07/17 0548  WBC 12.9* 14.4*  HGB 14.8 16.1  HCT 42.8 47.1  MCV 94.7 94.4  PLT 304 238   Cardiac Enzymes: No results for input(s): CKTOTAL, CKMB, CKMBINDEX, TROPONINI in the last 168 hours. BNP (last 3 results) No results for input(s): PROBNP in the last 8760 hours. CBG: Recent Labs  Lab 11/08/17 2046  GLUCAP 136*   D-Dimer: No results for input(s): DDIMER in the last 72 hours. Hgb A1c: No results for input(s): HGBA1C in the last 72 hours. Lipid Profile: No results for input(s): CHOL, HDL, LDLCALC, TRIG, CHOLHDL, LDLDIRECT in the last 72 hours. Thyroid function studies: No results for input(s): TSH, T4TOTAL, T3FREE, THYROIDAB in the last 72 hours.  Invalid input(s): FREET3 Anemia work up: No results for input(s): VITAMINB12, FOLATE, FERRITIN, TIBC, IRON, RETICCTPCT in the last 72 hours. Sepsis Labs: Recent Labs  Lab 11/06/17 0735 11/07/17 0548  WBC 12.9* 14.4*   Microbiology No results found for this or any previous visit (from the past 240 hour(s)).   Medications:   . enoxaparin (LOVENOX) injection  40 mg Subcutaneous Daily  . metoprolol tartrate  10 mg Intravenous Q6H   Continuous Infusions: . sodium chloride 100 mL/hr at 11/10/17 2122  . famotidine (PEPCID) IV Stopped (11/10/17 2200)      LOS: 5 days   [facility] Pager [phone]  *Please refer to [email or web address], password [email or web address] to get updated schedule on who will round on this patient, as hospitalists switch teams weekly. If 7PM-7AM, please contact night-coverage at [email or web address], password [email or web address] for any overnight needs.  11/11/2017, 9:07 AM

## 2017-11-11 NOTE — Progress Notes (Addendum)
Patient ID: Oscar [name], male   DOB: [birth year], 84 y.o.   MRN: [id number] Up on floor and asked to see this patient.  He has thus far refused NG and surgery.  Have gotten permission for NG tube from patient and as a first step and then will check lab.    Kaylyn Lim  Radiology services not available tonight at [facility].  Will order DG abdomen.  Patient seen after xrays.  Up shaving;  Reports that he heard a pop when he sat down on the toilet.  WBC normal.  Xray shows decreased small bowel distention and contrast in the right colon.    Will reassess in the AM.   [place] hrs.

## 2017-11-11 NOTE — Progress Notes (Signed)
   Subjective/Chief Complaint: Says he feels fine. Has not had a bowel movement. Emesis last night. He is coming around to NG and/or surgery.    Objective: Vital signs in last 24 hours: Temp:  [98.3 F (36.8 C)-98.6 F (37 C)] 98.3 F (36.8 C) (07/06 0527) Pulse Rate:  [70-97] 97 (07/06 0527) Resp:  [17-19] 18 (07/06 0527) BP: (140-170)/(81-87) 155/82 (07/06 0527) SpO2:  [95 %-99 %] 99 % (07/06 0527) Last BM Date: 11/08/17  Intake/Output from previous day: No intake/output data recorded. Intake/Output this shift: No intake/output data recorded.  General appearance: alert and cooperative Resp: clear to auscultation bilaterally Cardio: regular rate and rhythm GI: soft, nontender. mild distension  Lab Results:  No results for input(s): WBC, HGB, HCT, PLT in the last 72 hours. BMET No results for input(s): NA, K, CL, CO2, GLUCOSE, BUN, CREATININE, CALCIUM in the last 72 hours. PT/INR No results for input(s): LABPROT, INR in the last 72 hours. ABG No results for input(s): PHART, HCO3 in the last 72 hours.  Invalid input(s): PCO2, PO2  Studies/Results: Dg Abd 2 Views  Result Date: 11/10/2017 CLINICAL DATA:  Small bowel obstruction. EXAM: ABDOMEN - 2 VIEW COMPARISON:  CT from 4 days ago. Abdominal CT from 4 days ago. Radiograph from yesterday. FINDINGS: Continued small bowel obstruction with dilated and gas/fluid-filled loops. Fluid-filled loop in the right abdomen measures up to 5.6 cm diameter. No evidence of pneumoperitoneum or pneumatosis. Left upper quadrant clips.  Lung bases are clear.  Cardiomegaly. IMPRESSION: Continued small bowel obstruction.  Negative for pneumoperitoneum. Electronically Signed   By: Monte Fantasia M.D.   On: 11/10/2017 11:40    Anti-infectives: Anti-infectives (From admission, onward)   Start     Dose/Rate Route Frequency Ordered Stop   11/08/17 1200  fluconazole (DIFLUCAN) IVPB 100 mg  Status:  Discontinued     100 mg 50 mL/hr over 60 Minutes  Intravenous Every 24 hours 11/08/17 1032 11/10/17 0934      Assessment/Plan: Recurrent SBO Patient has refused NG and surgery to this point. I discussed with him again the merits/ possible necessity of one or both. Discussed risks of surgery including bowel injury, bowel resection, leak, bleeding, infection, scarring, pain, recurrent obstruction, ileus, DVT/PE, pneumonia, stoke, heart attack, and possible need for NG post-op anyway. He will think about it and talk with his wife when she gets here.   LOS: 5 days    Clovis Riley 11/11/2017

## 2017-11-12 ENCOUNTER — Inpatient Hospital Stay (HOSPITAL_COMMUNITY): Payer: Medicare Other

## 2017-11-12 LAB — BASIC METABOLIC PANEL
ANION GAP: 11 (ref 5–15)
BUN: 19 mg/dL (ref 8–23)
CALCIUM: 8.9 mg/dL (ref 8.9–10.3)
CHLORIDE: 107 mmol/L (ref 98–111)
CO2: 26 mmol/L (ref 22–32)
Creatinine, Ser: 0.9 mg/dL (ref 0.61–1.24)
GFR calc non Af Amer: >60 mL/min (ref >60)
Glucose, Bld: 110 mg/dL — ABNORMAL HIGH (ref 70–99)
POTASSIUM: 3.5 mmol/L (ref 3.5–5.1)
Sodium: 144 mmol/L (ref 135–145)

## 2017-11-12 MED ORDER — SODIUM CHLORIDE 0.9 % IV SOLN: INTRAVENOUS | Status: DC

## 2017-11-12 MED ORDER — POTASSIUM CHLORIDE IN NACL 40-0.9 MEQ/L-% IV SOLN
INTRAVENOUS | Status: DC
Start: 2017-11-12 — End: 2017-11-15
  Administered 2017-11-12 – 2017-11-13 (×2): 50 mL/h via INTRAVENOUS
  Administered 2017-11-14: 75 mL/h via INTRAVENOUS
  Filled 2017-11-12 (×5): qty 1000

## 2017-11-12 NOTE — Progress Notes (Signed)
[name]    Progress Note  Oscar [name]  GUY:[id number] DOB: 1933/01/30 DOA: 11/06/2017 PCP: Deland Pretty, MD     Brief Narrative:   Oscar [name] is an 84 y.o. male past medical history of hypertension esophageal cancer in remission presents to the emergency room complaining of acute abdominal pain nausea and vomiting was diagnosed with an SBO  Assessment/Plan:   SBO (small bowel obstruction) ([facility]) Abdominal x-ray revealed persistent SBO. Further management per surgery.  Mild hyperkalemia: Resolved with IV fluid hydration.  Essential HTN (hypertension) N.p.o., blood pressure continues to be high continue IV hydralazine and Lopressor.  OSA (obstructive sleep apnea) Continue CPAP at night  Dysphagia: Now resolved, d/c diflucan. Discomfort likely due abdominal process.  New abnormal chest x-ray: Chest x-ray shows some atelectasis/infiltrates on the right lower and upper lung. He denies any cough, he has remained afebrile with no leukocytosis. Question more likely atelectasis.  Will repeat chest x-ray in the morning.  Monitor fever curve check a CBC in the morning  DVT prophylaxis: lovenox Family Communication:none Disposition Plan/Barrier to D/C: unable to determine Code Status:     Code Status Orders  (From admission, onward)        Start     Ordered   11/06/17 0937  Full code  Continuous     11/06/17 0939    Code Status History    Date Active Date Inactive Code Status Order ID Comments User Context   02/12/2017 0504 02/15/2017 1959 Full Code [id number]  Toy Baker, MD Inpatient   01/29/2017 1610 02/03/2017 1941 Full Code [id number]  Louellen Molder, MD Inpatient   10/31/2016 0301 11/02/2016 1945 Full Code [id number]  [name], MD ED        IV Access:    Peripheral IV   Procedures and diagnostic studies:   Dg Abd 2 Views  Result Date: 11/10/2017 CLINICAL DATA:  Small bowel obstruction. EXAM: ABDOMEN - 2  VIEW COMPARISON:  CT from 4 days ago. Abdominal CT from 4 days ago. Radiograph from yesterday. FINDINGS: Continued small bowel obstruction with dilated and gas/fluid-filled loops. Fluid-filled loop in the right abdomen measures up to 5.6 cm diameter. No evidence of pneumoperitoneum or pneumatosis. Left upper quadrant clips.  Lung bases are clear.  Cardiomegaly. IMPRESSION: Continued small bowel obstruction.  Negative for pneumoperitoneum. Electronically Signed   By: Monte Fantasia M.D.   On: 11/10/2017 11:40   Dg Abd Acute W/chest  Result Date: 11/11/2017 CLINICAL DATA:  Bowel obstruction history of esophageal cancer EXAM: DG ABDOMEN ACUTE W/ 1V CHEST COMPARISON:  11/10/2017, 11/09/2017, 11/08/2017, 11/07/2017, 02/12/2017 FINDINGS: Single-view chest demonstrates airspace disease at the left base. Patchy airspace disease in the right upper lobe. Cardiomegaly with aortic atherosclerosis. No pneumothorax. Supine and upright views of the abdomen demonstrate surgical changes in the upper abdomen. No free air beneath the diaphragm. Small scattered fluid levels. Residual contrast within the right colon. Overall decreased central small bowel gas. Dilute contrast within dilated small bowel in the abdomen measuring up to 5.7 cm. IMPRESSION: 1. Cardiomegaly. Patchy infiltrate at the left base and right upper lobe. 2. Dilute contrast filled dilated small bowel with decreased central gas consistent with ongoing small bowel obstruction. No interval free air. Electronically Signed   By: Donavan Foil M.D.   On: 11/11/2017 19:32     Medical Consultants:    None.  Anti-Infectives:   none  Subjective:    [name] he vomited twice yesterday has not had  a bowel movement..  Objective:    Vitals:   11/11/17 1414 11/11/17 2110 11/12/17 0004 11/12/17 0608  BP: (!) 155/91 (!) 163/83 (!) 160/75 (!) 158/78  Pulse: 90 92 98 76  Resp: 18 19  20   Temp: 98.5 F (36.9 C) 99.5 F (37.5 C)  98.7 F (37.1 C)    TempSrc: Oral Oral  Oral  SpO2: 96% 96% 100% 94%  Weight:      Height:        Intake/Output Summary (Last 24 hours) at 11/12/2017 0758 Last data filed at 11/12/2017 6761 Gross per 24 hour  Intake 2020 ml  Output 500 ml  Net 1520 ml   Filed Weights   11/06/17 0651  Weight: 88.9 kg (196 lb)    Exam: General exam: In no acute distress. Respiratory system: Good air movement and clear to auscultation. Cardiovascular system: S1 & S2 heard, RRR.  Gastrointestinal system: Abdomen is more distended, soft nontender Central nervous system: Alert and oriented. No focal neurological deficits. Extremities: No pedal edema. Skin: No rashes, lesions or ulcers Psychiatry: Judgement and insight appear normal. Mood & affect appropriate.    Data Reviewed:    Labs: Basic Metabolic Panel: Recent Labs  Lab 11/07/17 0548 11/08/17 0540 11/11/17 0806 11/11/17 1819 11/12/17 0505  NA 139 141 143 141 144  K 5.2* 4.1 4.2 3.7 3.5  CL 98 102 105 107 107  CO2 24 22 27 23 26   GLUCOSE 112* 147* 116* 106* 110*  BUN 14 22 21 19 19   CREATININE 0.96 0.96 1.00 0.86 0.90  CALCIUM 9.5 9.3 9.0 8.7* 8.9  MG 1.8  --   --   --   --    GFR Estimated Creatinine Clearance: 73.6 mL/min (by C-G formula based on SCr of 0.9 mg/dL). Liver Function Tests: Recent Labs  Lab 11/06/17 0735 11/07/17 0548  AST 23 28  ALT 13 12  ALKPHOS 71 69  BILITOT 1.8* 2.0*  PROT 6.9 7.6  ALBUMIN 4.2 4.3   Recent Labs  Lab 11/06/17 0735  LIPASE 22   No results for input(s): AMMONIA in the last 168 hours. Coagulation profile No results for input(s): INR, PROTIME in the last 168 hours.  CBC: Recent Labs  Lab 11/06/17 0735 11/07/17 0548 11/11/17 1819  WBC 12.9* 14.4* 9.0  NEUTROABS  --   --  6.6  HGB 14.8 16.1 13.3  HCT 42.8 47.1 39.7  MCV 94.7 94.4 96.6  PLT 304 238 288   Cardiac Enzymes: No results for input(s): CKTOTAL, CKMB, CKMBINDEX, TROPONINI in the last 168 hours. BNP (last 3 results) No results for  input(s): PROBNP in the last 8760 hours. CBG: Recent Labs  Lab 11/08/17 2046  GLUCAP 136*   D-Dimer: No results for input(s): DDIMER in the last 72 hours. Hgb A1c: No results for input(s): HGBA1C in the last 72 hours. Lipid Profile: No results for input(s): CHOL, HDL, LDLCALC, TRIG, CHOLHDL, LDLDIRECT in the last 72 hours. Thyroid function studies: No results for input(s): TSH, T4TOTAL, T3FREE, THYROIDAB in the last 72 hours.  Invalid input(s): FREET3 Anemia work up: No results for input(s): VITAMINB12, FOLATE, FERRITIN, TIBC, IRON, RETICCTPCT in the last 72 hours. Sepsis Labs: Recent Labs  Lab 11/06/17 0735 11/07/17 0548 11/11/17 1819  WBC 12.9* 14.4* 9.0   Microbiology No results found for this or any previous visit (from the past 240 hour(s)).   Medications:   . enoxaparin (LOVENOX) injection  40 mg Subcutaneous Daily  . metoprolol tartrate  10 mg Intravenous Q6H   Continuous Infusions: . sodium chloride Stopped (11/12/17 0507)  . famotidine (PEPCID) IV Stopped (11/11/17 2250)      LOS: 6 days   Charlynne Cousins  [facility] Pager [phone]  *Please refer to [email or web address], password [email or web address] to get updated schedule on who will round on this patient, as hospitalists switch teams weekly. If 7PM-7AM, please contact night-coverage at [email or web address], password [email or web address] for any overnight needs.  11/12/2017, 7:58 AM

## 2017-11-12 NOTE — Progress Notes (Signed)
Soap suds enema given at this time with gas only results per pt.

## 2017-11-12 NOTE — Progress Notes (Signed)
Writer alerted MD via text and verbal that results are in for patient's chest xray of this morning.

## 2017-11-12 NOTE — Progress Notes (Signed)
Patient ID: Oscar [name], male   DOB: 1932-07-12, 84 y.o.   MRN: [id number]     Brief GI Note  Received request for possible GI consult regarding evaluation of patient's foregut, prior to proceeding with exploratory lap and lysis of adhesions for small bowel obstruction. Patient has been hospitalized since 11/06/2017 with a persistent small bowel obstruction.  He had initially declined surgery and NG decompression.  He is apparently now more willing to have surgery.  He was evaluated by Dr. Hassell Done today. Patient has remote history of esophageal cancer and underwent distal esophagectomy in 2001.  He is known to Dr. Scarlette Shorts.  He has been in remission. Last EGD was done in April 2015 the most proximal esophagus was normal, he had evidence of prior distal esophageal resection with gastroenteric anastomosis at 25 cm.  GI is available if Dr. Hassell Done wishes to discuss, in setting of small bowel obstruction if further visualization of the forgot is desired barium swallow would be appropriate.

## 2017-11-12 NOTE — Progress Notes (Signed)
[name]    Progress Note  Oscar [name]  GQQ:[id number] DOB: 11-Jul-1932 DOA: 11/06/2017 PCP: Deland Pretty, MD     Brief Narrative:   Oscar [name] is an 84 y.o. male past medical history of hypertension esophageal cancer in remission presents to the emergency room complaining of acute abdominal pain nausea and vomiting was diagnosed with an SBO  Assessment/Plan:   SBO (small bowel obstruction) (HCC) Abdominal x-ray revealed persistent SBO. Further management per surgery. Him and his wife is agreed to surgery.  Surgery has requested a GI consult for possible endoscopy.  Mild hyperkalemia: Resolved with IV fluid hydration.  Essential HTN (hypertension) N.p.o., blood pressure continues to be high continue IV hydralazine and Lopressor.  OSA (obstructive sleep apnea) Continue CPAP at night  Dysphagia: Now resolved, d/c diflucan. Discomfort likely due abdominal process.  New abnormal chest x-ray: Chest x-ray shows some atelectasis/infiltrates on the right lower and upper lung. [name] denies any cough or shortness of breath, [name] has remained afebrile with no leukocytosis. Question more likely atelectasis.  Will repeat chest x-ray in the morning.  Monitor fever curve check a CBC in the morning  DVT prophylaxis: lovenox Family Communication:none Disposition Plan/Barrier to D/C: unable to determine Code Status:     Code Status Orders  (From admission, onward)        Start     Ordered   11/06/17 0937  Full code  Continuous     11/06/17 0939    Code Status History    Date Active Date Inactive Code Status Order ID Comments User Context   02/12/2017 0504 02/15/2017 1959 Full Code [id number]  Toy Baker, MD Inpatient   01/29/2017 1610 02/03/2017 1941 Full Code [id number]  Louellen Molder, MD Inpatient   10/31/2016 0301 11/02/2016 1945 Full Code [id number]  [name], MD ED        IV Access:    Peripheral IV   Procedures and  diagnostic studies:   Dg Abd 2 Views  Result Date: 11/10/2017 CLINICAL DATA:  Small bowel obstruction. EXAM: ABDOMEN - 2 VIEW COMPARISON:  CT from 4 days ago. Abdominal CT from 4 days ago. Radiograph from yesterday. FINDINGS: Continued small bowel obstruction with dilated and gas/fluid-filled loops. Fluid-filled loop in the right abdomen measures up to 5.6 cm diameter. No evidence of pneumoperitoneum or pneumatosis. Left upper quadrant clips.  Lung bases are clear.  Cardiomegaly. IMPRESSION: Continued small bowel obstruction.  Negative for pneumoperitoneum. Electronically Signed   By: Monte Fantasia M.D.   On: 11/10/2017 11:40   Dg Abd Acute W/chest  Result Date: 11/11/2017 CLINICAL DATA:  Bowel obstruction history of esophageal cancer EXAM: DG ABDOMEN ACUTE W/ 1V CHEST COMPARISON:  11/10/2017, 11/09/2017, 11/08/2017, 11/07/2017, 02/12/2017 FINDINGS: Single-view chest demonstrates airspace disease at the left base. Patchy airspace disease in the right upper lobe. Cardiomegaly with aortic atherosclerosis. No pneumothorax. Supine and upright views of the abdomen demonstrate surgical changes in the upper abdomen. No free air beneath the diaphragm. Small scattered fluid levels. Residual contrast within the right colon. Overall decreased central small bowel gas. Dilute contrast within dilated small bowel in the abdomen measuring up to 5.7 cm. IMPRESSION: 1. Cardiomegaly. Patchy infiltrate at the left base and right upper lobe. 2. Dilute contrast filled dilated small bowel with decreased central gas consistent with ongoing small bowel obstruction. No interval free air. Electronically Signed   By: Donavan Foil M.D.   On: 11/11/2017 19:32     Medical Consultants:  None.  Anti-Infectives:   none  Subjective:    Oscar [name] relate [name] passed gas yesterday.  Denies any abdominal pain  Objective:    Vitals:   11/11/17 1414 11/11/17 2110 11/12/17 0004 11/12/17 0608  BP: (!) 155/91 (!)  163/83 (!) 160/75 (!) 158/78  Pulse: 90 92 98 76  Resp: 18 19  20   Temp: 98.5 F (36.9 C) 99.5 F (37.5 C)  98.7 F (37.1 C)  TempSrc: Oral Oral  Oral  SpO2: 96% 96% 100% 94%  Weight:      Height:        Intake/Output Summary (Last 24 hours) at 11/12/2017 0928 Last data filed at 11/12/2017 0918 Gross per 24 hour  Intake 2020 ml  Output 200 ml  Net 1820 ml   Filed Weights   11/06/17 0651  Weight: 88.9 kg (196 lb)    Exam: General exam: In no acute distress. Respiratory system: Good air movement and clear to auscultation. Cardiovascular system: S1 & S2 heard, RRR.  Gastrointestinal system: Abdomen is more distended, soft nontender Central nervous system: Alert and oriented. No focal neurological deficits. Extremities: No pedal edema. Skin: No rashes, lesions or ulcers Psychiatry: Judgement and insight appear normal. Mood & affect appropriate.    Data Reviewed:    Labs: Basic Metabolic Panel: Recent Labs  Lab 11/07/17 0548 11/08/17 0540 11/11/17 0806 11/11/17 1819 11/12/17 0505  NA 139 141 143 141 144  K 5.2* 4.1 4.2 3.7 3.5  CL 98 102 105 107 107  CO2 24 22 27 23 26   GLUCOSE 112* 147* 116* 106* 110*  BUN 14 22 21 19 19   CREATININE 0.96 0.96 1.00 0.86 0.90  CALCIUM 9.5 9.3 9.0 8.7* 8.9  MG 1.8  --   --   --   --    GFR Estimated Creatinine Clearance: 73.6 mL/min (by C-G formula based on SCr of 0.9 mg/dL). Liver Function Tests: Recent Labs  Lab 11/06/17 0735 11/07/17 0548  AST 23 28  ALT 13 12  ALKPHOS 71 69  BILITOT 1.8* 2.0*  PROT 6.9 7.6  ALBUMIN 4.2 4.3   Recent Labs  Lab 11/06/17 0735  LIPASE 22   No results for input(s): AMMONIA in the last 168 hours. Coagulation profile No results for input(s): INR, PROTIME in the last 168 hours.  CBC: Recent Labs  Lab 11/06/17 0735 11/07/17 0548 11/11/17 1819  WBC 12.9* 14.4* 9.0  NEUTROABS  --   --  6.6  HGB 14.8 16.1 13.3  HCT 42.8 47.1 39.7  MCV 94.7 94.4 96.6  PLT 304 238 288   Cardiac  Enzymes: No results for input(s): CKTOTAL, CKMB, CKMBINDEX, TROPONINI in the last 168 hours. BNP (last 3 results) No results for input(s): PROBNP in the last 8760 hours. CBG: Recent Labs  Lab 11/08/17 2046  GLUCAP 136*   D-Dimer: No results for input(s): DDIMER in the last 72 hours. Hgb A1c: No results for input(s): HGBA1C in the last 72 hours. Lipid Profile: No results for input(s): CHOL, HDL, LDLCALC, TRIG, CHOLHDL, LDLDIRECT in the last 72 hours. Thyroid function studies: No results for input(s): TSH, T4TOTAL, T3FREE, THYROIDAB in the last 72 hours.  Invalid input(s): FREET3 Anemia work up: No results for input(s): VITAMINB12, FOLATE, FERRITIN, TIBC, IRON, RETICCTPCT in the last 72 hours. Sepsis Labs: Recent Labs  Lab 11/06/17 0735 11/07/17 0548 11/11/17 1819  WBC 12.9* 14.4* 9.0   Microbiology No results found for this or any previous visit (from the past  240 hour(s)).   Medications:   . enoxaparin (LOVENOX) injection  40 mg Subcutaneous Daily  . metoprolol tartrate  10 mg Intravenous Q6H   Continuous Infusions: . 0.9 % NaCl with KCl 40 mEq / L 50 mL/hr (11/12/17 0824)  . famotidine (PEPCID) IV Stopped (11/11/17 2250)      LOS: 6 days   Charlynne Cousins  [facility] Pager [phone]  *Please refer to [email or web address], password [email or web address] to get updated schedule on who will round on this patient, as hospitalists switch teams weekly. If 7PM-7AM, please contact night-coverage at [email or web address], password [email or web address] for any overnight needs.  11/12/2017, 9:28 AM

## 2017-11-12 NOTE — Progress Notes (Signed)
Patient ID: Oscar [name], male   DOB: 1932/08/11, 84 y.o.   MRN: [id number] [facility] Surgery Progress Note:   * No surgery date entered *  Subjective: Mental status is baseline;  Wife present.  Not complaining of abdominal pain.  Objective: Vital signs in last 24 hours: Temp:  [98.5 F (36.9 C)-99.5 F (37.5 C)] 98.7 F (37.1 C) (07/07 0608) Pulse Rate:  [76-98] 76 (07/07 0608) Resp:  [18-20] 20 (07/07 0608) BP: (155-172)/(75-91) 158/78 (07/07 0608) SpO2:  [94 %-100 %] 94 % (07/07 0608)  Intake/Output from previous day: 07/06 0701 - 07/07 0700 In: 2020 [I.V.:2020] Out: 750 [Urine:750] Intake/Output this shift: No intake/output data recorded.  Physical Exam: Work of breathing is normal.  Abdomen is soft and nontender.    Lab Results:  Results for orders placed or performed during the hospital encounter of 11/06/17 (from the past 48 hour(s))  Basic metabolic panel     Status: Abnormal   Collection Time: 11/11/17  8:06 AM  Result Value Ref Range   Sodium 143 135 - 145 mmol/L   Potassium 4.2 3.5 - 5.1 mmol/L   Chloride 105 98 - 111 mmol/L    Comment: Please note change in reference range.   CO2 27 22 - 32 mmol/L   Glucose, Bld 116 (H) 70 - 99 mg/dL    Comment: Please note change in reference range.   BUN 21 8 - 23 mg/dL    Comment: Please note change in reference range.   Creatinine, Ser 1.00 0.61 - 1.24 mg/dL   Calcium 9.0 8.9 - 10.3 mg/dL   GFR calc non Af Amer >60 >60 mL/min   GFR calc Af Amer >60 >60 mL/min    Comment: (NOTE) The eGFR has been calculated using the CKD EPI equation. This calculation has not been validated in all clinical situations. eGFR's persistently <60 mL/min signify possible Chronic Kidney Disease.    Anion gap 11 5 - 15    Comment: Performed at [facility], [place] [address] [place], [place] [address]  CBC with Differential/Platelet     Status: Abnormal   Collection Time: 11/11/17  6:19 PM  Result Value Ref Range    WBC 9.0 4.0 - 10.5 K/uL   RBC 4.11 (L) 4.22 - 5.81 MIL/uL   Hemoglobin 13.3 13.0 - 17.0 g/dL   HCT 39.7 39.0 - 52.0 %   MCV 96.6 78.0 - 100.0 fL   MCH 32.4 26.0 - 34.0 pg   MCHC 33.5 30.0 - 36.0 g/dL   RDW 12.4 11.5 - 15.5 %   Platelets 288 150 - 400 K/uL   Neutrophils Relative % 74 %   Neutro Abs 6.6 1.7 - 7.7 K/uL   Lymphocytes Relative 13 %   Lymphs Abs 1.2 0.7 - 4.0 K/uL   Monocytes Relative 13 %   Monocytes Absolute 1.2 (H) 0.1 - 1.0 K/uL   Eosinophils Relative 0 %   Eosinophils Absolute 0.0 0.0 - 0.7 K/uL   Basophils Relative 0 %   Basophils Absolute 0.0 0.0 - 0.1 K/uL    Comment: Performed at [facility], [place] [address], [place], [place] [address]  Basic metabolic panel     Status: Abnormal   Collection Time: 11/11/17  6:19 PM  Result Value Ref Range   Sodium 141 135 - 145 mmol/L   Potassium 3.7 3.5 - 5.1 mmol/L   Chloride 107 98 - 111 mmol/L    Comment: Please note change in reference  range.   CO2 23 22 - 32 mmol/L   Glucose, Bld 106 (H) 70 - 99 mg/dL    Comment: Please note change in reference range.   BUN 19 8 - 23 mg/dL    Comment: Please note change in reference range.   Creatinine, Ser 0.86 0.61 - 1.24 mg/dL   Calcium 8.7 (L) 8.9 - 10.3 mg/dL   GFR calc non Af Amer >60 >60 mL/min   GFR calc Af Amer >60 >60 mL/min    Comment: (NOTE) The eGFR has been calculated using the CKD EPI equation. This calculation has not been validated in all clinical situations. eGFR's persistently <60 mL/min signify possible Chronic Kidney Disease.    Anion gap 11 5 - 15    Comment: Performed at [facility], [place] [address] [place], [place] [address]  Basic metabolic panel     Status: Abnormal   Collection Time: 11/12/17  5:05 AM  Result Value Ref Range   Sodium 144 135 - 145 mmol/L   Potassium 3.5 3.5 - 5.1 mmol/L   Chloride 107 98 - 111 mmol/L    Comment: Please note change in reference range.   CO2 26 22 - 32 mmol/L   Glucose, Bld  110 (H) 70 - 99 mg/dL    Comment: Please note change in reference range.   BUN 19 8 - 23 mg/dL    Comment: Please note change in reference range.   Creatinine, Ser 0.90 0.61 - 1.24 mg/dL   Calcium 8.9 8.9 - 10.3 mg/dL   GFR calc non Af Amer >60 >60 mL/min   GFR calc Af Amer >60 >60 mL/min    Comment: (NOTE) The eGFR has been calculated using the CKD EPI equation. This calculation has not been validated in all clinical situations. eGFR's persistently <60 mL/min signify possible Chronic Kidney Disease.    Anion gap 11 5 - 15    Comment: Performed at [facility], [place] [address], [place],  [address]    Radiology/Results: Dg Abd 2 Views  Result Date: 11/10/2017 CLINICAL DATA:  Small bowel obstruction. EXAM: ABDOMEN - 2 VIEW COMPARISON:  CT from 4 days ago. Abdominal CT from 4 days ago. Radiograph from yesterday. FINDINGS: Continued small bowel obstruction with dilated and gas/fluid-filled loops. Fluid-filled loop in the right abdomen measures up to 5.6 cm diameter. No evidence of pneumoperitoneum or pneumatosis. Left upper quadrant clips.  Lung bases are clear.  Cardiomegaly. IMPRESSION: Continued small bowel obstruction.  Negative for pneumoperitoneum. Electronically Signed   By: Monte Fantasia M.D.   On: 11/10/2017 11:40   Dg Abd Acute W/chest  Result Date: 11/11/2017 CLINICAL DATA:  Bowel obstruction history of esophageal cancer EXAM: DG ABDOMEN ACUTE W/ 1V CHEST COMPARISON:  11/10/2017, 11/09/2017, 11/08/2017, 11/07/2017, 02/12/2017 FINDINGS: Single-view chest demonstrates airspace disease at the left base. Patchy airspace disease in the right upper lobe. Cardiomegaly with aortic atherosclerosis. No pneumothorax. Supine and upright views of the abdomen demonstrate surgical changes in the upper abdomen. No free air beneath the diaphragm. Small scattered fluid levels. Residual contrast within the right colon. Overall decreased central small bowel gas. Dilute contrast  within dilated small bowel in the abdomen measuring up to 5.7 cm. IMPRESSION: 1. Cardiomegaly. Patchy infiltrate at the left base and right upper lobe. 2. Dilute contrast filled dilated small bowel with decreased central gas consistent with ongoing small bowel obstruction. No interval free air. Electronically Signed   By: Donavan Foil M.D.   On: 11/11/2017  19:32    Anti-infectives: Anti-infectives (From admission, onward)   Start     Dose/Rate Route Frequency Ordered Stop   11/08/17 1200  fluconazole (DIFLUCAN) IVPB 100 mg  Status:  Discontinued     100 mg 50 mL/hr over 60 Minutes Intravenous Every 24 hours 11/08/17 1032 11/10/17 0934      Assessment/Plan: Problem List: Patient Active Problem List   Diagnosis Date Noted  . Malnutrition of moderate degree 02/15/2017  . Leukocytosis 02/12/2017  . OSA (obstructive sleep apnea) 02/12/2017  . Dehydration 02/12/2017  . Partial small bowel obstruction ([place]) 01/29/2017  . SBO (small bowel obstruction) ([place]) 10/31/2016  . Aortic insufficiency 07/09/2013  . HTN (hypertension) 07/09/2013  . PERSONAL HISTORY MALIGNANT NEOPLASM ESOPHAGUS 07/17/2009  . GERD 07/13/2009  . BARRETT'S ESOPHAGUS 07/13/2009  . DYSPHAGIA 07/13/2009  . FLATULENCE-GAS-BLOATING 07/13/2009    I want to discuss case with Dr. Henrene Pastor who scopes him.  He has been spitting up and we need to be more certain about his neoforegut before tackling his small bowel.   For now I would give some enemas to clear out his colon.  Xrays to me looked better last night.   * No surgery date entered *    LOS: 6 days   Matt B. Hassell Done, MD, [facility] [phone] beeper [phone]  11/12/2017 8:49 AM

## 2017-11-13 ENCOUNTER — Inpatient Hospital Stay (HOSPITAL_COMMUNITY): Payer: Medicare Other

## 2017-11-13 LAB — BASIC METABOLIC PANEL
Anion gap: 14 (ref 5–15)
BUN: 17 mg/dL (ref 8–23)
CHLORIDE: 104 mmol/L (ref 98–111)
CO2: 24 mmol/L (ref 22–32)
Calcium: 8.6 mg/dL — ABNORMAL LOW (ref 8.9–10.3)
Creatinine, Ser: 0.9 mg/dL (ref 0.61–1.24)
GFR calc Af Amer: >60 mL/min (ref >60)
GFR calc non Af Amer: >60 mL/min (ref >60)
GLUCOSE: 89 mg/dL (ref 70–99)
POTASSIUM: 3.4 mmol/L — AB (ref 3.5–5.1)
Sodium: 142 mmol/L (ref 135–145)

## 2017-11-13 LAB — CBC
HEMATOCRIT: 36.4 % — AB (ref 39.0–52.0)
Hemoglobin: 12.1 g/dL — ABNORMAL LOW (ref 13.0–17.0)
MCH: 32.1 pg (ref 26.0–34.0)
MCHC: 33.2 g/dL (ref 30.0–36.0)
MCV: 96.6 fL (ref 78.0–100.0)
Platelets: 319 10*3/uL (ref 150–400)
RBC: 3.77 MIL/uL — ABNORMAL LOW (ref 4.22–5.81)
RDW: 12.6 % (ref 11.5–15.5)
WBC: 7.4 10*3/uL (ref 4.0–10.5)

## 2017-11-13 LAB — MAGNESIUM: Magnesium: 1.6 mg/dL — ABNORMAL LOW (ref 1.7–2.4)

## 2017-11-13 MED ORDER — HYDRALAZINE HCL 20 MG/ML IJ SOLN
10.0000 mg | Freq: Four times a day (QID) | INTRAMUSCULAR | Status: DC | PRN
  Administered 2017-11-18 – 2017-11-21 (×8): 10 mg via INTRAVENOUS
  Filled 2017-11-13 (×8): qty 1

## 2017-11-13 MED ORDER — MAGNESIUM SULFATE 2 GM/50ML IV SOLN
2.0000 g | Freq: Once | INTRAVENOUS | Status: AC
  Administered 2017-11-13: 2 g via INTRAVENOUS
  Filled 2017-11-13: qty 50

## 2017-11-13 NOTE — Care Management Important Message (Signed)
Important Message  Patient Details  Name: Oscar [name] MRN: [id number] Date of Birth: 06-Dec-1932   Medicare Important Message Given:  Yes    Kerin Salen 11/13/2017, 1:53 PMImportant Message  Patient Details  Name: Oscar [name] MRN: [id number] Date of Birth: 1932-11-21   Medicare Important Message Given:  Yes    Kerin Salen 11/13/2017, 1:53 PM

## 2017-11-13 NOTE — Progress Notes (Addendum)
    CC:  SBO  Subjective: Refusing enema yesterday, some emesis PM 7/5, refusing NG, and enema last PM.  Agreeing to enema now.  We are looking into getting Ba swallow.    Objective: Vital signs in last 24 hours: Temp:  [98 F (36.7 C)-98.6 F (37 C)] 98 F (36.7 C) (07/08 0530) Pulse Rate:  [86-98] 98 (07/08 0530) Resp:  [16-18] 16 (07/08 0530) BP: (152-173)/(65-82) 173/82 (07/08 0530) SpO2:  [92 %-96 %] 92 % (07/08 0530) Last BM Date: 11/08/17  Intake/Output from previous day: 07/07 0701 - 07/08 0700 In: 140 [I.V.:90; IV Piggyback:50] Out: 203 [Urine:202; Stool:1] Intake/Output this shift: No intake/output data recorded.  General appearance: alert, cooperative and no distress Resp: clear to auscultation bilaterally GI: still distended, some intermittent emesis, feels like he could have a BM but none so far.    Lab Results:  Recent Labs    11/11/17 1819 11/13/17 0525  WBC 9.0 7.4  HGB 13.3 12.1*  HCT 39.7 36.4*  PLT 288 319    BMET Recent Labs    11/12/17 0505 11/13/17 0525  NA 144 142  K 3.5 3.4*  CL 107 104  CO2 26 24  GLUCOSE 110* 89  BUN 19 17  CREATININE 0.90 0.90  CALCIUM 8.9 8.6*   PT/INR No results for input(s): LABPROT, INR in the last 72 hours.  Recent Labs  Lab 11/07/17 0548  AST 28  ALT 12  ALKPHOS 69  BILITOT 2.0*  PROT 7.6  ALBUMIN 4.3     Lipase     Component Value Date/Time   LIPASE 22 11/06/2017 0735     Medications: . enoxaparin (LOVENOX) injection  40 mg Subcutaneous Daily  . metoprolol tartrate  10 mg Intravenous Q6H   . 0.9 % NaCl with KCl 40 mEq / L 50 mL/hr (11/13/17 0902)  . famotidine (PEPCID) IV Stopped (11/13/17 1053)   Assessment/Plan Hx of esophageal CA - s/p esophagectomy 2001_ [facility] HTN T2DM HLD  BPH Dysphagia  SBO - admitted 11/06/17 - VP:[id number] fluid-filled small bowel loops are noted in the abdomen and pelvis. Distal small bowel loops are decompressed. Findings compatible with  small-bowel obstruction. Transition point appears to be in the midline of the pelvis - NGT unable to be placed in radiology - KUB this AM with dilated loops small bowel and no contrast in colon - repeat film tomorrow AM - ambulate and try dulcolax suppository today  - we will follow  - refused surgery x 2 last week with Dr. Marlou Starks Hyperkalemia - 3.4 check Mag Malnutrition - check prealbumin in AM with labs - ? PICC line TNA  FEN: NPO, IVF;  VTE: SCDs ID: No abx indicated  Plan:  Discussed with DR Henrene Pastor yesterday, and he wants to be able to look at "neoforegut,"  before trying to surgery for SBO.  He ordered enemas pt has so far refused, willing to have one now.  Discussed with Dr. Jeannine Kitten and Dr. Hassell Done.  We ill go for low volume BA, to minimize the Ba.  He did allow enema this AM and he has had just a little hard stool.         LOS: 7 days    Jonathan Corpus 11/13/2017 [phone]

## 2017-11-13 NOTE — Progress Notes (Signed)
[name]    Progress Note  Oscar [name]  OHY:[id number] DOB: 07-03-1932 DOA: 11/06/2017 PCP: Deland Pretty, MD     Brief Narrative:   Oscar [name] is an 84 y.o. male past medical history of hypertension esophageal cancer in remission presents to the emergency room complaining of acute abdominal pain nausea and vomiting was diagnosed with an SBO  Assessment/Plan:   SBO (small bowel obstruction) (HCC) Abdominal x-ray revealed persistent SBO. Further management per surgery. Him and his wife is agreed to surgery.  Surgery has requested a GI consult for possible endoscopy,, GI recommended a swallowing barium esophagus, will defer to surgery.  Mild hyperkalemia: Resolved with IV fluid hydration.  Essential HTN (hypertension) N.p.o., blood pressure continues to be high continue IV hydralazine and Lopressor.  OSA (obstructive sleep apnea) Continue CPAP at night  Dysphagia: Now resolved, d/c diflucan. Discomfort likely due abdominal process.  New abnormal chest x-ray: He denies any cough or shortness of breath has remained afebrile with no leukocytosis, questionable atelectasis.  DVT prophylaxis: lovenox Family Communication:none Disposition Plan/Barrier to D/C: unable to determine Code Status:     Code Status Orders  (From admission, onward)        Start     Ordered   11/06/17 0937  Full code  Continuous     11/06/17 0939    Code Status History    Date Active Date Inactive Code Status Order ID Comments User Context   02/12/2017 0504 02/15/2017 1959 Full Code [id number]  Toy Baker, MD Inpatient   01/29/2017 1610 02/03/2017 1941 Full Code [id number]  Louellen Molder, MD Inpatient   10/31/2016 0301 11/02/2016 1945 Full Code [id number]  [name], MD ED        IV Access:    Peripheral IV   Procedures and diagnostic studies:   Dg Chest 2 View  Result Date: 11/12/2017 CLINICAL DATA:  Abnormal chest radiograph. EXAM:  CHEST - 2 VIEW COMPARISON:  11/11/2017; 02/12/2017; 07/30/2010 FINDINGS: Grossly unchanged cardiac silhouette and mediastinal contours with atherosclerotic plaque when the thoracic aorta. Retrocardiac opacities compatible with provided history of gastric pull-through. Postsurgical change of the left hilum. Ill-defined heterogeneous opacities within the right mid and left lower lung are unchanged given differences in imaging technique. No new focal airspace opacities. Trace pleural effusions are not excluded. No pneumothorax. No acute osseus abnormalities. Mild scoliotic curvature of the thoracolumbar spine. IMPRESSION: Grossly unchanged right mid and left lower lung heterogeneous opacities with differential considerations including asymmetric pulmonary edema versus aspiration / multifocal infection. Clinical correlation is advised. A follow-up chest radiograph in 3 to 4 weeks after treatment is recommended to ensure resolution. Electronically Signed   By: Sandi Mariscal M.D.   On: 11/12/2017 12:21   Dg Abd Acute W/chest  Result Date: 11/11/2017 CLINICAL DATA:  Bowel obstruction history of esophageal cancer EXAM: DG ABDOMEN ACUTE W/ 1V CHEST COMPARISON:  11/10/2017, 11/09/2017, 11/08/2017, 11/07/2017, 02/12/2017 FINDINGS: Single-view chest demonstrates airspace disease at the left base. Patchy airspace disease in the right upper lobe. Cardiomegaly with aortic atherosclerosis. No pneumothorax. Supine and upright views of the abdomen demonstrate surgical changes in the upper abdomen. No free air beneath the diaphragm. Small scattered fluid levels. Residual contrast within the right colon. Overall decreased central small bowel gas. Dilute contrast within dilated small bowel in the abdomen measuring up to 5.7 cm. IMPRESSION: 1. Cardiomegaly. Patchy infiltrate at the left base and right upper lobe. 2. Dilute contrast filled dilated small bowel with decreased central  gas consistent with ongoing small bowel obstruction. No  interval free air. Electronically Signed   By: Donavan Foil M.D.   On: 11/11/2017 19:32     Medical Consultants:    None.  Anti-Infectives:   none  Subjective:    Oscar [name] he relate he passed gas yesterday.  Denies any abdominal pain  Objective:    Vitals:   11/12/17 0608 11/12/17 1517 11/12/17 2105 11/13/17 0530  BP: (!) 158/78 (!) 152/65 (!) 155/65 (!) 173/82  Pulse: 76 86 86 98  Resp: 20 18 17 16   Temp: 98.7 F (37.1 C) 98 F (36.7 C) 98.6 F (37 C) 98 F (36.7 C)  TempSrc: Oral Oral Oral Oral  SpO2: 94% 95% 96% 92%  Weight:      Height:        Intake/Output Summary (Last 24 hours) at 11/13/2017 0859 Last data filed at 11/13/2017 0842 Gross per 24 hour  Intake 140 ml  Output 203 ml  Net -63 ml   Filed Weights   11/06/17 0651  Weight: 88.9 kg (196 lb)    Exam: General exam: In no acute distress. Respiratory system: Good air movement and clear to auscultation. Cardiovascular system: S1 & S2 heard, RRR.  Gastrointestinal system: Abdomen is more distended, soft nontender Central nervous system: Alert and oriented. No focal neurological deficits. Extremities: No pedal edema. Skin: No rashes, lesions or ulcers Psychiatry: Judgement and insight appear normal. Mood & affect appropriate.    Data Reviewed:    Labs: Basic Metabolic Panel: Recent Labs  Lab 11/07/17 0548 11/08/17 0540 11/11/17 0806 11/11/17 1819 11/12/17 0505 11/13/17 0525  NA 139 141 143 141 144 142  K 5.2* 4.1 4.2 3.7 3.5 3.4*  CL 98 102 105 107 107 104  CO2 24 22 27 23 26 24   GLUCOSE 112* 147* 116* 106* 110* 89  BUN 14 22 21 19 19 17   CREATININE 0.96 0.96 1.00 0.86 0.90 0.90  CALCIUM 9.5 9.3 9.0 8.7* 8.9 8.6*  MG 1.8  --   --   --   --   --    GFR Estimated Creatinine Clearance: 73.6 mL/min (by C-G formula based on SCr of 0.9 mg/dL). Liver Function Tests: Recent Labs  Lab 11/07/17 0548  AST 28  ALT 12  ALKPHOS 69  BILITOT 2.0*  PROT 7.6  ALBUMIN 4.3   No  results for input(s): LIPASE, AMYLASE in the last 168 hours. No results for input(s): AMMONIA in the last 168 hours. Coagulation profile No results for input(s): INR, PROTIME in the last 168 hours.  CBC: Recent Labs  Lab 11/07/17 0548 11/11/17 1819 11/13/17 0525  WBC 14.4* 9.0 7.4  NEUTROABS  --  6.6  --   HGB 16.1 13.3 12.1*  HCT 47.1 39.7 36.4*  MCV 94.4 96.6 96.6  PLT 238 288 319   Cardiac Enzymes: No results for input(s): CKTOTAL, CKMB, CKMBINDEX, TROPONINI in the last 168 hours. BNP (last 3 results) No results for input(s): PROBNP in the last 8760 hours. CBG: Recent Labs  Lab 11/08/17 2046  GLUCAP 136*   D-Dimer: No results for input(s): DDIMER in the last 72 hours. Hgb A1c: No results for input(s): HGBA1C in the last 72 hours. Lipid Profile: No results for input(s): CHOL, HDL, LDLCALC, TRIG, CHOLHDL, LDLDIRECT in the last 72 hours. Thyroid function studies: No results for input(s): TSH, T4TOTAL, T3FREE, THYROIDAB in the last 72 hours.  Invalid input(s): FREET3 Anemia work up: No results for input(s): VITAMINB12,  FOLATE, FERRITIN, TIBC, IRON, RETICCTPCT in the last 72 hours. Sepsis Labs: Recent Labs  Lab 11/07/17 0548 11/11/17 1819 11/13/17 0525  WBC 14.4* 9.0 7.4   Microbiology No results found for this or any previous visit (from the past 240 hour(s)).   Medications:   . enoxaparin (LOVENOX) injection  40 mg Subcutaneous Daily  . metoprolol tartrate  10 mg Intravenous Q6H   Continuous Infusions: . 0.9 % NaCl with KCl 40 mEq / L 10 mL/hr (11/12/17 1330)  . famotidine (PEPCID) IV Stopped (11/12/17 2245)      LOS: 7 days   Charlynne Cousins  [facility] Pager [phone]  *Please refer to [email or web address], password [email or web address] to get updated schedule on who will round on this patient, as hospitalists switch teams weekly. If 7PM-7AM, please contact night-coverage at [email or web address], password [email or web address] for any overnight needs.  11/13/2017, 8:59 AM

## 2017-11-14 ENCOUNTER — Inpatient Hospital Stay: Payer: Self-pay

## 2017-11-14 ENCOUNTER — Inpatient Hospital Stay (HOSPITAL_COMMUNITY): Payer: Medicare Other

## 2017-11-14 LAB — CBC
HCT: 36.1 % — ABNORMAL LOW (ref 39.0–52.0)
HEMOGLOBIN: 12 g/dL — AB (ref 13.0–17.0)
MCH: 32 pg (ref 26.0–34.0)
MCHC: 33.2 g/dL (ref 30.0–36.0)
MCV: 96.3 fL (ref 78.0–100.0)
Platelets: 342 10*3/uL (ref 150–400)
RBC: 3.75 MIL/uL — ABNORMAL LOW (ref 4.22–5.81)
RDW: 12.6 % (ref 11.5–15.5)
WBC: 7.2 10*3/uL (ref 4.0–10.5)

## 2017-11-14 LAB — COMPREHENSIVE METABOLIC PANEL
ALK PHOS: 38 U/L (ref 38–126)
ALT: 14 U/L (ref 0–44)
ANION GAP: 11 (ref 5–15)
AST: 16 U/L (ref 15–41)
Albumin: 3.1 g/dL — ABNORMAL LOW (ref 3.5–5.0)
BUN: 15 mg/dL (ref 8–23)
CALCIUM: 8.4 mg/dL — AB (ref 8.9–10.3)
CO2: 24 mmol/L (ref 22–32)
Chloride: 106 mmol/L (ref 98–111)
Creatinine, Ser: 0.8 mg/dL (ref 0.61–1.24)
GFR calc non Af Amer: >60 mL/min (ref >60)
Glucose, Bld: 82 mg/dL (ref 70–99)
Potassium: 3.4 mmol/L — ABNORMAL LOW (ref 3.5–5.1)
Sodium: 141 mmol/L (ref 135–145)
TOTAL PROTEIN: 6 g/dL — AB (ref 6.5–8.1)
Total Bilirubin: 1.9 mg/dL — ABNORMAL HIGH (ref 0.3–1.2)

## 2017-11-14 LAB — PREALBUMIN: Prealbumin: 6.1 mg/dL — ABNORMAL LOW (ref 18–38)

## 2017-11-14 LAB — GLUCOSE, CAPILLARY: GLUCOSE-CAPILLARY: 93 mg/dL (ref 70–99)

## 2017-11-14 LAB — PHOSPHORUS: PHOSPHORUS: 2.9 mg/dL (ref 2.5–4.6)

## 2017-11-14 LAB — MAGNESIUM: MAGNESIUM: 1.8 mg/dL (ref 1.7–2.4)

## 2017-11-14 MED ORDER — MAGNESIUM SULFATE 2 GM/50ML IV SOLN
2.0000 g | Freq: Once | INTRAVENOUS | Status: AC
  Administered 2017-11-14: 2 g via INTRAVENOUS
  Filled 2017-11-14: qty 50

## 2017-11-14 MED ORDER — INSULIN ASPART 100 UNIT/ML ~~LOC~~ SOLN
0.0000 [IU] | Freq: Four times a day (QID) | SUBCUTANEOUS | Status: DC
  Administered 2017-11-15: 2 [IU] via SUBCUTANEOUS
  Administered 2017-11-15: 3 [IU] via SUBCUTANEOUS

## 2017-11-14 MED ORDER — TRACE MINERALS CR-CU-MN-SE-ZN 10-1000-500-60 MCG/ML IV SOLN
INTRAVENOUS | Status: AC
  Administered 2017-11-14: 17:00:00 via INTRAVENOUS
  Filled 2017-11-14: qty 960

## 2017-11-14 MED ORDER — HALOPERIDOL LACTATE 5 MG/ML IJ SOLN: 1.0000 mg | Freq: Four times a day (QID) | INTRAMUSCULAR | Status: DC | PRN

## 2017-11-14 MED ORDER — METOCLOPRAMIDE HCL 5 MG/ML IJ SOLN
5.0000 mg | Freq: Four times a day (QID) | INTRAMUSCULAR | Status: AC
  Administered 2017-11-14 – 2017-11-17 (×12): 5 mg via INTRAVENOUS
  Filled 2017-11-14 (×13): qty 2

## 2017-11-14 MED ORDER — SODIUM CHLORIDE 0.9% FLUSH: 10.0000 mL | INTRAVENOUS | Status: DC | PRN

## 2017-11-14 MED ORDER — FAT EMULSION PLANT BASED 20 % IV EMUL
240.0000 mL | INTRAVENOUS | Status: AC
  Administered 2017-11-14: 240 mL via INTRAVENOUS
  Filled 2017-11-14: qty 250

## 2017-11-14 NOTE — Progress Notes (Addendum)
PHARMACY - ADULT TOTAL PARENTERAL NUTRITION CONSULT NOTE   Pharmacy Consult for TPN Indication: bowel obstruction  Patient Measurements: Height: '6\' 2"'  (188 cm) Weight: 196 lb (88.9 kg) IBW/kg (Calculated) : 82.2 TPN AdjBW (KG): 88.9 Body mass index is 25.16 kg/m.  Insulin Requirements: none  Current Nutrition: NPO except for ice chips  IVF: NS with 92mq KCl/L at 750mhr (increased this AM from 5070mr)  Central access: PICC ordered for today, 7/9 TPN start date: plan 7/9  ASSESSMENT                                                                                                          HPI:  [age]oM with PMH of HTN, remote esophageal cancer in remission, OSA, DM controlled with diet and exercise who presented to [facility] on 11/06/17 with abdominal pain, n/v and found to have small bowel obstruction. Abdominal film today c/w ongoing partial small bowel obstruction. Pharmacy consulted to start TPN.   Significant events:   Today:    Glucose - 82 on AM CMET  Electrolytes - K+ low at 3.4, Mag 1.8, all others (including corrected Ca) WNL   Renal - SCr WNL  LFTs - AST/ALT, Alk Phos WNL. Tbili elevated at 1.9  TGs - ordered for AM  Prealbumin - 6.1  NUTRITIONAL GOALS                                                                                             RD recs: Kcal:  2400-2600 kcal  Protein:  120-130 g  Clinimix 5/20 at a goal rate of 100m66m + 20% fat emulsion at 20ml73mx 12h to provide: 120 g/day protein, 2592 kcal/day.  PLAN             Magnesium Sulfate 2g IV x 1  MD increased rate of IVF this AM for low K+                                                                                                          At 1800 today (after PICC placed):  Start Clinimix E 5/20 at 40ml/64m 20% fat emulsion at 20ml/h70m12h.  Plan to advance as tolerated to the goal rate.  TPN to contain  standard multivitamins and trace elements. Monitor Tbili closely.   Add  famotidine 57m in TPN bag. D/C IVPB order.   Continue IVF at current rate of 769mhr (discussed with Dr. FeAileen Fass  Add sensitive SSI with CBGs q6h.   TPN lab panels in AM and on Mondays & Thursdays.  F/u daily.   JiLindell SparPharmD, BCPS Pager: [phone] 3842/01/2018 11:50 AM

## 2017-11-14 NOTE — Evaluation (Signed)
Physical Therapy Evaluation Patient Details Name: Oscar [name] MRN: [id number] DOB: [birth year] Today's Date: 11/14/2017   History of Present Illness  84 yo male admitted with SBO, abd pain. Hx of remote esophageal ca, OA, gout, Av block, DM  Clinical Impression  On eval, pt required Min assist for mobility. He walked ~300 feet while holding on to IV pole and hallway handrail for steadying. He presents with general weakness, decreased activity tolerance, and impaired gait and balance. Will follow during hospital stay. May need to consider RW use for safe ambulation. Recommend HHPT as long as pt progresses well.     Follow Up Recommendations Home health PT;Supervision/Assistance - 24 hour    Equipment Recommendations  (may need a RW if pt doesn't already have one AND if he is agreeable)    Recommendations for Other Services       Precautions / Restrictions Precautions Precautions: Fall Restrictions Weight Bearing Restrictions: No      Mobility  Bed Mobility Overal bed mobility: Modified Independent                Transfers Overall transfer level: Needs assistance Equipment used: (IV pole) Transfers: Sit to/from Stand Sit to Stand: Min assist         General transfer comment: A to steady.   Ambulation/Gait Ambulation/Gait assistance: Min Web designer (Feet): 300 Feet Assistive device: IV Pole Gait Pattern/deviations: Step-through pattern;Drifts right/left;Staggering right;Staggering left     General Gait Details: A to steady throughout distance. Pt also intermittently held on to hallway handrail.   Stairs            Wheelchair Mobility    Modified Rankin (Stroke Patients Only)       Balance Overall balance assessment: Needs assistance           Standing balance-Leahy Scale: Fair                               Pertinent Vitals/Pain Pain Assessment: Faces Faces Pain Scale: Hurts little more Pain Location: abd Pain  Descriptors / Indicators: Grimacing;Spasm Pain Intervention(s): Monitored during session    Home Living Family/patient expects to be discharged to:: Private residence Living Arrangements: Spouse/significant other   Type of Home: House Home Access: Stairs to enter   Technical brewer of Steps: a few Home Layout: Multi-level Home Equipment: Cane - single point      Prior Function Level of Independence: Independent with assistive device(s)         Comments: uses cane PRN     Hand Dominance        Extremity/Trunk Assessment   Upper Extremity Assessment Upper Extremity Assessment: Generalized weakness    Lower Extremity Assessment Lower Extremity Assessment: Generalized weakness    Cervical / Trunk Assessment Cervical / Trunk Assessment: Normal  Communication   Communication: No difficulties  Cognition Arousal/Alertness: Awake/alert Behavior During Therapy: WFL for tasks assessed/performed Overall Cognitive Status: Within Functional Limits for tasks assessed                                        General Comments      Exercises     Assessment/Plan    PT Assessment Patient needs continued PT services  PT Problem List Decreased mobility;Decreased balance;Decreased strength;Decreased activity tolerance       PT Treatment Interventions Gait training;Therapeutic  activities;Functional mobility training;Balance training;Patient/family education;Therapeutic exercise    PT Goals (Current goals can be found in the Care Plan section)  Acute Rehab PT Goals Patient Stated Goal: home soon PT Goal Formulation: With patient Time For Goal Achievement: 11/28/17 Potential to Achieve Goals: Good    Frequency Min 3X/week   Barriers to discharge        Co-evaluation               AM-PAC PT "6 Clicks" Daily Activity  Outcome Measure Difficulty turning over in bed (including adjusting bedclothes, sheets and blankets)?: None Difficulty  moving from lying on back to sitting on the side of the bed? : None Difficulty sitting down on and standing up from a chair with arms (e.g., wheelchair, bedside commode, etc,.)?: A Little Help needed moving to and from a bed to chair (including a wheelchair)?: A Little Help needed walking in hospital room?: A Little Help needed climbing 3-5 steps with a railing? : A Lot 6 Click Score: 19    End of Session Equipment Utilized During Treatment: Gait belt Activity Tolerance: Patient limited by fatigue Patient left: in bed;with call bell/phone within reach(sitting EOB awaiting NT asssitance-made NT aware) Nurse Communication: (pt waiting for assistance to wash up/change clothes) PT Visit Diagnosis: Muscle weakness (generalized) (M62.81);Unsteadiness on feet (R26.81);Pain Pain - part of body: (abd)    Time: 6606-3016 PT Time Calculation (min) (ACUTE ONLY): 22 min   Charges:   PT Evaluation $PT Eval Moderate Complexity: 1 Mod     PT G Codes:         Weston Anna, MPT Pager: [phone]

## 2017-11-14 NOTE — Progress Notes (Signed)
[name]    Progress Note  Oscar [name]  YTK:[id number] DOB: [birth year] DOA: 11/06/2017 PCP: Deland Pretty, MD     Brief Narrative:   Oscar [name] is an 84 y.o. male past medical history of hypertension esophageal cancer in remission presents to the emergency room complaining of acute abdominal pain nausea and vomiting was diagnosed with an SBO, patient refused surgery on 11/13/2017 surgery is following, now he has agreed to have surgery we are awaiting abdominal x-ray  Assessment/Plan:   SBO (small bowel obstruction) ([place]) Abdominal x-ray revealed persistent SBO. Further management per surgery.  Abdominal x-ray this morning is pending.  Mild hyperkalemia: Resolved with IV fluid hydration.  Essential HTN (hypertension) N.p.o., blood pressure continues to be high continue IV hydralazine and Lopressor.  OSA (obstructive sleep apnea) Continue CPAP at night  Dysphagia: Now resolved, d/c diflucan. Discomfort likely due abdominal process.  New abnormal chest x-ray: He denies any cough or shortness of breath has remained afebrile with no leukocytosis, questionable atelectasis.  DVT prophylaxis: lovenox Family Communication:none Disposition Plan/Barrier to D/C: unable to determine Code Status:     Code Status Orders  (From admission, onward)        Start     Ordered   11/06/17 0937  Full code  Continuous     11/06/17 0939    Code Status History    Date Active Date Inactive Code Status Order ID Comments User Context   02/12/2017 0504 02/15/2017 1959 Full Code [id number]  Toy Baker, MD Inpatient   01/29/2017 1610 02/03/2017 1941 Full Code [id number]  Louellen Molder, MD Inpatient   10/31/2016 0301 11/02/2016 1945 Full Code [id number]  [name], MD ED        IV Access:    Peripheral IV   Procedures and diagnostic studies:   Dg Chest 2 View  Result Date: 11/12/2017 CLINICAL DATA:  Abnormal chest radiograph. EXAM: CHEST -  2 VIEW COMPARISON:  11/11/2017; 02/12/2017; 07/30/2010 FINDINGS: Grossly unchanged cardiac silhouette and mediastinal contours with atherosclerotic plaque when the thoracic aorta. Retrocardiac opacities compatible with provided history of gastric pull-through. Postsurgical change of the left hilum. Ill-defined heterogeneous opacities within the right mid and left lower lung are unchanged given differences in imaging technique. No new focal airspace opacities. Trace pleural effusions are not excluded. No pneumothorax. No acute osseus abnormalities. Mild scoliotic curvature of the thoracolumbar spine. IMPRESSION: Grossly unchanged right mid and left lower lung heterogeneous opacities with differential considerations including asymmetric pulmonary edema versus aspiration / multifocal infection. Clinical correlation is advised. A follow-up chest radiograph in 3 to 4 weeks after treatment is recommended to ensure resolution. Electronically Signed   By: Sandi Mariscal M.D.   On: 11/12/2017 12:21   Dg Esophagus  Result Date: 11/13/2017 CLINICAL DATA:  84 year old male with esophageal cancer post gastric pull-up. Small bowel obstruction. Initial encounter. EXAM: ESOPHOGRAM/BARIUM SWALLOW TECHNIQUE: Single contrast examination was performed using thin barium per request of Dr. Hassell Done. : Fluoroscopy Time:  2 minutes and 6 seconds. Radiation Exposure Index: 20 mGy. COMPARISON:  11/06/2017 CT abdomen and pelvis. FINDINGS: Transient laryngeal penetration without aspiration. Post esophagectomy with gastric pull up. Smooth narrowing at the anastomotic site (proximal 1/3 of the native esophagus). No evidence of irregularity or leak at the anastomotic site. Slight delayed emptying from the stomach into dilated small bowel loops consistent with patient's small-bowel obstruction. IMPRESSION: Post partial esophagectomy with gastric pull up. Smooth appearance of the anastomotic site without irregularity, stricture or leak  identified.  Delayed emptying from the gastric pull up into dilated small bowel loops consistent with patient's small-bowel obstruction. Transient laryngeal penetration. Electronically Signed   By: Genia Del M.D.   On: 11/13/2017 14:04     Medical Consultants:    None.  Anti-Infectives:   none  Subjective:    Oscar [name] he relates he had a bowel movement yesterday still passing gas.  Objective:    Vitals:   11/13/17 2006 11/13/17 2100 11/13/17 2355 11/14/17 0418  BP: (!) 145/75  (!) 162/83 (!) 150/72  Pulse: 90  99 85  Resp: 16  18 20   Temp: (!) 97.4 F (36.3 C) 97.8 F (36.6 C) 98.8 F (37.1 C) 98.6 F (37 C)  TempSrc: Oral Oral Oral   SpO2: 96%  96% 96%  Weight:      Height:        Intake/Output Summary (Last 24 hours) at 11/14/2017 0821 Last data filed at 11/14/2017 6578 Gross per 24 hour  Intake 1141.99 ml  Output 200 ml  Net 941.99 ml   Filed Weights   11/06/17 0651  Weight: 88.9 kg (196 lb)    Exam: General exam: In no acute distress. Respiratory system: Good air movement and clear to auscultation. Cardiovascular system: S1 & S2 heard, RRR.  Gastrointestinal system: Abdomen is more distended, soft nontender Central nervous system: Alert and oriented. No focal neurological deficits. Extremities: No pedal edema. Skin: No rashes, lesions or ulcers Psychiatry: Judgement and insight appear normal. Mood & affect appropriate.    Data Reviewed:    Labs: Basic Metabolic Panel: Recent Labs  Lab 11/11/17 0806 11/11/17 1819 11/12/17 0505 11/13/17 0525 11/14/17 0544  NA 143 141 144 142 141  K 4.2 3.7 3.5 3.4* 3.4*  CL 105 107 107 104 106  CO2 27 23 26 24 24   GLUCOSE 116* 106* 110* 89 82  BUN 21 19 19 17 15   CREATININE 1.00 0.86 0.90 0.90 0.80  CALCIUM 9.0 8.7* 8.9 8.6* 8.4*  MG  --   --   --  1.6*  --    GFR Estimated Creatinine Clearance: 82.8 mL/min (by C-G formula based on SCr of 0.8 mg/dL). Liver Function Tests: Recent Labs  Lab  11/14/17 0544  AST 16  ALT 14  ALKPHOS 38  BILITOT 1.9*  PROT 6.0*  ALBUMIN 3.1*   No results for input(s): LIPASE, AMYLASE in the last 168 hours. No results for input(s): AMMONIA in the last 168 hours. Coagulation profile No results for input(s): INR, PROTIME in the last 168 hours.  CBC: Recent Labs  Lab 11/11/17 1819 11/13/17 0525 11/14/17 0544  WBC 9.0 7.4 7.2  NEUTROABS 6.6  --   --   HGB 13.3 12.1* 12.0*  HCT 39.7 36.4* 36.1*  MCV 96.6 96.6 96.3  PLT 288 319 342   Cardiac Enzymes: No results for input(s): CKTOTAL, CKMB, CKMBINDEX, TROPONINI in the last 168 hours. BNP (last 3 results) No results for input(s): PROBNP in the last 8760 hours. CBG: Recent Labs  Lab 11/08/17 2046  GLUCAP 136*   D-Dimer: No results for input(s): DDIMER in the last 72 hours. Hgb A1c: No results for input(s): HGBA1C in the last 72 hours. Lipid Profile: No results for input(s): CHOL, HDL, LDLCALC, TRIG, CHOLHDL, LDLDIRECT in the last 72 hours. Thyroid function studies: No results for input(s): TSH, T4TOTAL, T3FREE, THYROIDAB in the last 72 hours.  Invalid input(s): FREET3 Anemia work up: No results for input(s): VITAMINB12, FOLATE, FERRITIN, TIBC,  IRON, RETICCTPCT in the last 72 hours. Sepsis Labs: Recent Labs  Lab 11/11/17 1819 11/13/17 0525 11/14/17 0544  WBC 9.0 7.4 7.2   Microbiology No results found for this or any previous visit (from the past 240 hour(s)).   Medications:   . enoxaparin (LOVENOX) injection  40 mg Subcutaneous Daily  . metoprolol tartrate  10 mg Intravenous Q6H   Continuous Infusions: . 0.9 % NaCl with KCl 40 mEq / L 50 mL/hr (11/13/17 2231)  . famotidine (PEPCID) IV Stopped (11/13/17 2256)      LOS: 8 days   Charlynne Cousins  [facility] Pager [phone]  *Please refer to [email or web address], password [email or web address] to get updated schedule on who will round on this patient, as hospitalists switch teams weekly. If 7PM-7AM, please contact  night-coverage at [email or web address], password [email or web address] for any overnight needs.  11/14/2017, 8:21 AM

## 2017-11-14 NOTE — Progress Notes (Signed)
Nutrition Follow-up  DOCUMENTATION CODES:   Non-severe (moderate) malnutrition in context of chronic illness  INTERVENTION:    TPN per pharmacy after PICC placement  Monitor for diet advancement/toleration  NUTRITION DIAGNOSIS:   Moderate Malnutrition related to chronic illness(recurrent SBO) as evidenced by energy intake < 75% for > or equal to 1 month, moderate muscle depletion, moderate fat depletion, severe muscle depletion.  Ongoing  GOAL:   Patient will meet greater than or equal to 90% of their needs  Not meeting  MONITOR:   PO intake, Diet advancement, Weight trends, Supplement acceptance, Labs  REASON FOR ASSESSMENT:   Malnutrition Screening Tool    ASSESSMENT:   Patient with PMH significant for HTN, remote esophogeal cancer s/p esophagectomy 2001, DM, and HLD. Presents this admission with complaints of nausea/vomiting x 24 hours. Found to have a small bowel obstruction. NGT unable to be placed in radiology.    Pt without nutrition for the last 8 days. Spoke with surgery yesterday regarding TPN initiation, but was awaiting PAB reading. Order placed for PICC/TPN today. X ray today shows ongoing partial SBO. Pt had bowel movement this morning and seems to be open to an enema today per nurse.   Weight noted to decrease by 4 lb since last RD visit on 7/2 (196 lb to 192 lb).   RD TPN recommendations: Clinimix 5/20 @ 100 ml/hr + 20% ILE @ 20 ml/hr x 12 hrs to provide 2592 kcal and 120 grams protein. This meets 100% of needs.  Monitor magnesium, potassium, and phosphorus daily for at least 3 days, MD to replete as needed, as pt is at risk for refeeding syndrome given no nutrition for the last 8 days.   Medications reviewed and include: Mg sulfate, NS with 40 mEq KCl @ 75 ml/hr Labs reviewed: K 3.4 (L) PAB 6.1 (L)  Diet Order:   Diet Order           Diet NPO time specified Except for: Ice Chips, Sips with Meds  Diet effective now          EDUCATION NEEDS:    Education needs have been addressed  Skin:  Skin Assessment: Reviewed RN Assessment  Last BM:  11/05/17- small  Height:   Ht Readings from Last 1 Encounters:  11/06/17 6\' 2"  (1.88 m)    Weight:   Wt Readings from Last 1 Encounters:  11/14/17 192 lb 7.4 oz (87.3 kg)    Ideal Body Weight:  86.4 kg  BMI:  Body mass index is 24.71 kg/m.  Estimated Nutritional Needs:   Kcal:  2400-2600 kcal  Protein:  120-130 g  Fluid:  >2.4 L/day    Mariana Single RD, LDN Clinical Nutrition Pager # - [phone]

## 2017-11-14 NOTE — Progress Notes (Signed)
Peripherally Inserted Central Catheter/Midline Placement  The IV Nurse has discussed with the patient and/or persons authorized to consent for the patient, the purpose of this procedure and the potential benefits and risks involved with this procedure.  The benefits include less needle sticks, lab draws from the catheter, and the patient may be discharged home with the catheter. Risks include, but not limited to, infection, bleeding, blood clot (thrombus formation), and puncture of an artery; nerve damage and irregular heartbeat and possibility to perform a PICC exchange if needed/ordered by physician.  Alternatives to this procedure were also discussed.  Bard Power PICC patient education guide, fact sheet on infection prevention and patient information card has been provided to patient /or left at bedside.    PICC/Midline Placement Documentation  PICC Double Lumen 11/14/17 PICC Right Brachial 44 cm 0 cm (Active)  Indication for Insertion or Continuance of Line Administration of hyperosmolar/irritating solutions (i.e. TPN, Vancomycin, etc.) 11/14/2017  3:48 PM  Exposed Catheter (cm) 0 cm 11/14/2017  3:48 PM  Site Assessment Clean;Dry;Intact 11/14/2017  3:48 PM  Lumen #1 Status Flushed;Blood return noted 11/14/2017  3:48 PM  Lumen #2 Status Flushed;Blood return noted 11/14/2017  3:48 PM  Dressing Type Transparent 11/14/2017  3:48 PM  Dressing Status Clean;Dry;Intact;Antimicrobial disc in place 11/14/2017  3:48 PM  Dressing Intervention New dressing 11/14/2017  3:48 PM  Dressing Change Due 11/21/17 11/14/2017  3:48 PM       Oscar [name] 11/14/2017, 3:50 PM

## 2017-11-14 NOTE — Progress Notes (Addendum)
CC: Abdominal pain  Subjective: Patient seems a little confused but knows he is at [place] long with blockage.  Abdomen is minimally tender, bowel sounds are still hypoactive.  He did have a couple bowel movements yesterday.  His a.m. x-ray is still pending.  He would really like some ice chips.  He is still refusing an enema.  Objective: Vital signs in last 24 hours: Temp:  [97.4 F (36.3 C)-98.8 F (37.1 C)] 98.6 F (37 C) (07/09 0418) Pulse Rate:  [84-99] 85 (07/09 0418) Resp:  [16-20] 20 (07/09 0418) BP: (145-162)/(72-83) 150/72 (07/09 0418) SpO2:  [96 %-97 %] 96 % (07/09 0418) Last BM Date: 11/13/17 N.p.o. 1142 IV Voided x4 BM x2 yesterday Afebrile vital signs are stable, sats are good on room air. Labs showed the potassium is still 3.4.  He got 2 g of magnesium last evening we will recheck this a.m. Bilirubin is 1.9 this a.m. which is where it was on admission 11/06/2017. Barium swallow yesterday showed delayed emptying of the gastric pull-up into the dilated small bowel loops consistent with small bowel obstruction with, transient laryngeal penetration. Abdominal x-rays this a.m. still pending.   Intake/Output from previous day: 07/08 0701 - 07/09 0700 In: 1142 [I.V.:1092; IV Piggyback:50] Out: 200 [Urine:200] Intake/Output this shift: No intake/output data recorded.  General appearance: alert, cooperative and no distress Resp: clear to auscultation bilaterally and add IS GI: Soft, minimally tender.  Bowel sounds are still hypoactive.  He did have 2 bowel movements yesterday.  Lab Results:  Recent Labs    11/13/17 0525 11/14/17 0544  WBC 7.4 7.2  HGB 12.1* 12.0*  HCT 36.4* 36.1*  PLT 319 342    BMET Recent Labs    11/13/17 0525 11/14/17 0544  NA 142 141  K 3.4* 3.4*  CL 104 106  CO2 24 24  GLUCOSE 89 82  BUN 17 15  CREATININE 0.90 0.80  CALCIUM 8.6* 8.4*   PT/INR No results for input(s): LABPROT, INR in the last 72 hours.  Recent Labs  Lab  11/14/17 0544  AST 16  ALT 14  ALKPHOS 38  BILITOT 1.9*  PROT 6.0*  ALBUMIN 3.1*     Lipase     Component Value Date/Time   LIPASE 22 11/06/2017 0735     Medications: . enoxaparin (LOVENOX) injection  40 mg Subcutaneous Daily  . metoprolol tartrate  10 mg Intravenous Q6H    Assessment/Plan Hx of esophageal CA - s/p esophagectomy 2001_ [place] [facility] HTN T2DM HLD  BPH Dysphagia  SBO - admitted 11/06/17 - ME:QASTMHD fluid-filled small bowel loops are noted in the abdomen and pelvis. Distal small bowel loops are decompressed. Findings compatible with small-bowel obstruction. Transition point appears to be in the midline of the pelvis -NGT unable to be placed in radiology -KUB this AM with dilated loops small bowel and no contrast in colon- repeat filmtomorrow AM - ambulate and try dulcolax suppository today - we will follow  - refused surgery x 2 last week with Dr. Marlou Starks Hyperkalemia- 3.4 check Mag Moderate Malnutrition - Prealbumin 6.1   FEN: NPO, IVF;  VTE: SCDs ID: No abx indicated  Plan: Exam x-rays still has not been completed.  Will get the a.m. film and decide on the next step.  I will give him some ice chips for now.  Address malnutrition question - prealbumin is pending.  Prealbumin 6.1 - ordering PICC/TNA Film today: Persistent dilated fluid-filled small intestine consistent with ongoing partial small bowel obstruction.  Hiatal hernia also noted. Contrast does reach the nondilated colon. Discussed with Dr. Hassell Done and we will try some Reglan to see if this will help him avoid surgery.      LOS: 8 days    Ariyel Jeangilles 11/14/2017 [phone]

## 2017-11-15 ENCOUNTER — Other Ambulatory Visit: Payer: Self-pay

## 2017-11-15 ENCOUNTER — Inpatient Hospital Stay (HOSPITAL_COMMUNITY): Payer: Medicare Other

## 2017-11-15 DIAGNOSIS — J9621 Acute and chronic respiratory failure with hypoxia: Secondary | ICD-10-CM | POA: Diagnosis not present

## 2017-11-15 LAB — CBC
HEMATOCRIT: 30.4 % — AB (ref 39.0–52.0)
Hemoglobin: 10.4 g/dL — ABNORMAL LOW (ref 13.0–17.0)
MCH: 32.9 pg (ref 26.0–34.0)
MCHC: 34.2 g/dL (ref 30.0–36.0)
MCV: 96.2 fL (ref 78.0–100.0)
Platelets: 304 10*3/uL (ref 150–400)
RBC: 3.16 MIL/uL — ABNORMAL LOW (ref 4.22–5.81)
RDW: 12.7 % (ref 11.5–15.5)
WBC: 14.6 10*3/uL — ABNORMAL HIGH (ref 4.0–10.5)

## 2017-11-15 LAB — DIFFERENTIAL
Basophils Absolute: 0 10*3/uL (ref 0.0–0.1)
Basophils Relative: 0 %
EOS ABS: 0 10*3/uL (ref 0.0–0.7)
Eosinophils Relative: 0 %
Lymphocytes Relative: 6 %
Lymphs Abs: 0.9 10*3/uL (ref 0.7–4.0)
MONO ABS: 1.5 10*3/uL — AB (ref 0.1–1.0)
MONOS PCT: 10 %
NEUTROS ABS: 12.2 10*3/uL — AB (ref 1.7–7.7)
Neutrophils Relative %: 84 %

## 2017-11-15 LAB — CBC WITH DIFFERENTIAL/PLATELET
BASOS PCT: 0 %
Basophils Absolute: 0 10*3/uL (ref 0.0–0.1)
EOS ABS: 0 10*3/uL (ref 0.0–0.7)
Eosinophils Relative: 0 %
HEMATOCRIT: 30.9 % — AB (ref 39.0–52.0)
HEMOGLOBIN: 10.4 g/dL — AB (ref 13.0–17.0)
LYMPHS ABS: 0.7 10*3/uL (ref 0.7–4.0)
Lymphocytes Relative: 4 %
MCH: 32.6 pg (ref 26.0–34.0)
MCHC: 33.7 g/dL (ref 30.0–36.0)
MCV: 96.9 fL (ref 78.0–100.0)
Monocytes Absolute: 1.2 10*3/uL — ABNORMAL HIGH (ref 0.1–1.0)
Monocytes Relative: 7 %
NEUTROS ABS: 14.6 10*3/uL — AB (ref 1.7–7.7)
NEUTROS PCT: 89 %
Platelets: 241 10*3/uL (ref 150–400)
RBC: 3.19 MIL/uL — ABNORMAL LOW (ref 4.22–5.81)
RDW: 12.8 % (ref 11.5–15.5)
WBC Morphology: INCREASED
WBC: 16.5 10*3/uL — ABNORMAL HIGH (ref 4.0–10.5)

## 2017-11-15 LAB — COMPREHENSIVE METABOLIC PANEL
ALBUMIN: 2.5 g/dL — AB (ref 3.5–5.0)
ALK PHOS: 40 U/L (ref 38–126)
ALK PHOS: 70 U/L (ref 38–126)
ALT: 19 U/L (ref 0–44)
ALT: 29 U/L (ref 0–44)
AST: 23 U/L (ref 15–41)
AST: 44 U/L — ABNORMAL HIGH (ref 15–41)
Albumin: 2.5 g/dL — ABNORMAL LOW (ref 3.5–5.0)
Anion gap: 6 (ref 5–15)
Anion gap: 9 (ref 5–15)
BILIRUBIN TOTAL: 2.3 mg/dL — AB (ref 0.3–1.2)
BILIRUBIN TOTAL: 2.8 mg/dL — AB (ref 0.3–1.2)
BUN: 15 mg/dL (ref 8–23)
BUN: 20 mg/dL (ref 8–23)
CALCIUM: 7.9 mg/dL — AB (ref 8.9–10.3)
CALCIUM: 8.1 mg/dL — AB (ref 8.9–10.3)
CO2: 27 mmol/L (ref 22–32)
CO2: 23 mmol/L (ref 22–32)
Chloride: 108 mmol/L (ref 98–111)
Chloride: 110 mmol/L (ref 98–111)
Creatinine, Ser: 0.89 mg/dL (ref 0.61–1.24)
Creatinine, Ser: 1.35 mg/dL — ABNORMAL HIGH (ref 0.61–1.24)
GFR calc Af Amer: >60 mL/min (ref >60)
GFR calc non Af Amer: >60 mL/min (ref >60)
GFR calc non Af Amer: 47 mL/min — ABNORMAL LOW (ref >60)
GFR, EST AFRICAN AMERICAN: 55 mL/min — AB (ref >60)
GLUCOSE: 242 mg/dL — AB (ref 70–99)
Glucose, Bld: 188 mg/dL — ABNORMAL HIGH (ref 70–99)
POTASSIUM: 3.5 mmol/L (ref 3.5–5.1)
Potassium: 3.5 mmol/L (ref 3.5–5.1)
Sodium: 141 mmol/L (ref 135–145)
Sodium: 142 mmol/L (ref 135–145)
TOTAL PROTEIN: 5.3 g/dL — AB (ref 6.5–8.1)
Total Protein: 5.1 g/dL — ABNORMAL LOW (ref 6.5–8.1)

## 2017-11-15 LAB — BRAIN NATRIURETIC PEPTIDE: B NATRIURETIC PEPTIDE 5: 566.1 pg/mL — AB (ref 0.0–100.0)

## 2017-11-15 LAB — BLOOD GAS, ARTERIAL
Acid-base deficit: 2 mmol/L (ref 0.0–2.0)
BICARBONATE: 20.1 mmol/L (ref 20.0–28.0)
Drawn by: 422461
FIO2: 100
O2 Content: 15 L/min
O2 Saturation: 93.2 %
PATIENT TEMPERATURE: 101.4
PH ART: 7.465 — AB (ref 7.350–7.450)
pCO2 arterial: 28.7 mmHg — ABNORMAL LOW (ref 32.0–48.0)
pO2, Arterial: 70.7 mmHg — ABNORMAL LOW (ref 83.0–108.0)

## 2017-11-15 LAB — MAGNESIUM
MAGNESIUM: 2 mg/dL (ref 1.7–2.4)
Magnesium: 1.6 mg/dL — ABNORMAL LOW (ref 1.7–2.4)

## 2017-11-15 LAB — URINALYSIS, ROUTINE W REFLEX MICROSCOPIC
BILIRUBIN URINE: NEGATIVE
GLUCOSE, UA: NEGATIVE mg/dL
KETONES UR: NEGATIVE mg/dL
LEUKOCYTES UA: NEGATIVE
Nitrite: NEGATIVE
PH: 5 (ref 5.0–8.0)
Protein, ur: NEGATIVE mg/dL
Specific Gravity, Urine: 1.011 (ref 1.005–1.030)

## 2017-11-15 LAB — GLUCOSE, CAPILLARY
GLUCOSE-CAPILLARY: 170 mg/dL — AB (ref 70–99)
GLUCOSE-CAPILLARY: 180 mg/dL — AB (ref 70–99)
Glucose-Capillary: 177 mg/dL — ABNORMAL HIGH (ref 70–99)
Glucose-Capillary: 196 mg/dL — ABNORMAL HIGH (ref 70–99)
Glucose-Capillary: 242 mg/dL — ABNORMAL HIGH (ref 70–99)

## 2017-11-15 LAB — STREP PNEUMONIAE URINARY ANTIGEN: STREP PNEUMO URINARY ANTIGEN: NEGATIVE

## 2017-11-15 LAB — PHOSPHORUS: Phosphorus: 1.8 mg/dL — ABNORMAL LOW (ref 2.5–4.6)

## 2017-11-15 LAB — TRIGLYCERIDES: Triglycerides: 102 mg/dL (ref <150)

## 2017-11-15 LAB — MRSA PCR SCREENING: MRSA by PCR: NEGATIVE

## 2017-11-15 MED ORDER — FUROSEMIDE 10 MG/ML IJ SOLN
40.0000 mg | Freq: Two times a day (BID) | INTRAMUSCULAR | Status: DC
  Administered 2017-11-16: 40 mg via INTRAVENOUS
  Filled 2017-11-15: qty 4

## 2017-11-15 MED ORDER — PIPERACILLIN-TAZOBACTAM 3.375 G IVPB
3.3750 g | Freq: Three times a day (TID) | INTRAVENOUS | Status: DC
  Administered 2017-11-15 – 2017-11-16 (×3): 3.375 g via INTRAVENOUS
  Filled 2017-11-15 (×3): qty 50

## 2017-11-15 MED ORDER — TRACE MINERALS CR-CU-MN-SE-ZN 10-1000-500-60 MCG/ML IV SOLN
INTRAVENOUS | Status: AC
  Administered 2017-11-15: 18:00:00 via INTRAVENOUS
  Filled 2017-11-15 (×2): qty 960

## 2017-11-15 MED ORDER — FUROSEMIDE 10 MG/ML IJ SOLN: 40.0000 mg | Freq: Three times a day (TID) | INTRAMUSCULAR | Status: DC

## 2017-11-15 MED ORDER — FUROSEMIDE 10 MG/ML IJ SOLN
40.0000 mg | Freq: Once | INTRAMUSCULAR | Status: AC
  Administered 2017-11-15 (×2): 40 mg via INTRAVENOUS

## 2017-11-15 MED ORDER — POTASSIUM CHLORIDE 10 MEQ/50ML IV SOLN
10.0000 meq | INTRAVENOUS | Status: AC
  Administered 2017-11-15 (×2): 10 meq via INTRAVENOUS
  Filled 2017-11-15 (×2): qty 50

## 2017-11-15 MED ORDER — POTASSIUM CHLORIDE 10 MEQ/100ML IV SOLN
10.0000 meq | INTRAVENOUS | Status: AC
  Administered 2017-11-15 – 2017-11-16 (×3): 10 meq via INTRAVENOUS
  Filled 2017-11-15 (×3): qty 100

## 2017-11-15 MED ORDER — MAGNESIUM SULFATE 4 GM/100ML IV SOLN
4.0000 g | Freq: Once | INTRAVENOUS | Status: AC
  Administered 2017-11-15: 4 g via INTRAVENOUS
  Filled 2017-11-15: qty 100

## 2017-11-15 MED ORDER — VANCOMYCIN HCL 10 G IV SOLR: 1500.0000 mg | INTRAVENOUS | Status: DC

## 2017-11-15 MED ORDER — VANCOMYCIN HCL 10 G IV SOLR
1750.0000 mg | Freq: Once | INTRAVENOUS | Status: AC
  Administered 2017-11-15: 1750 mg via INTRAVENOUS
  Filled 2017-11-15: qty 1750

## 2017-11-15 MED ORDER — FUROSEMIDE 10 MG/ML IJ SOLN
INTRAMUSCULAR | Status: AC
  Administered 2017-11-15: 40 mg via INTRAVENOUS
  Filled 2017-11-15: qty 4

## 2017-11-15 MED ORDER — PROMETHAZINE HCL 25 MG RE SUPP
12.5000 mg | Freq: Four times a day (QID) | RECTAL | Status: DC | PRN
  Filled 2017-11-15: qty 1

## 2017-11-15 MED ORDER — FAT EMULSION PLANT BASED 20 % IV EMUL
240.0000 mL | INTRAVENOUS | Status: AC
  Administered 2017-11-15: 240 mL via INTRAVENOUS
  Filled 2017-11-15: qty 250

## 2017-11-15 MED ORDER — SODIUM CHLORIDE 0.9 % IV SOLN
1.0000 g | Freq: Three times a day (TID) | INTRAVENOUS | Status: DC
  Administered 2017-11-15: 1 g via INTRAVENOUS
  Filled 2017-11-15 (×2): qty 1

## 2017-11-15 MED ORDER — POTASSIUM PHOSPHATES 15 MMOLE/5ML IV SOLN
20.0000 mmol | Freq: Once | INTRAVENOUS | Status: AC
  Administered 2017-11-15: 20 mmol via INTRAVENOUS
  Filled 2017-11-15: qty 6.67

## 2017-11-15 MED ORDER — PROMETHAZINE HCL 25 MG/ML IJ SOLN: 6.2500 mg | Freq: Four times a day (QID) | INTRAMUSCULAR | Status: DC | PRN

## 2017-11-15 MED ORDER — INSULIN ASPART 100 UNIT/ML ~~LOC~~ SOLN
0.0000 [IU] | Freq: Four times a day (QID) | SUBCUTANEOUS | Status: DC
  Administered 2017-11-15 – 2017-11-16 (×4): 3 [IU] via SUBCUTANEOUS
  Administered 2017-11-16: 5 [IU] via SUBCUTANEOUS
  Administered 2017-11-16: 3 [IU] via SUBCUTANEOUS
  Administered 2017-11-17: 8 [IU] via SUBCUTANEOUS
  Administered 2017-11-17: 5 [IU] via SUBCUTANEOUS

## 2017-11-15 NOTE — Progress Notes (Addendum)
Pharmacy Antibiotic Note  Oscar [name] is a 84 y.o. male admitted on 11/06/2017 with SBO.  Pharmacy has been consulted for vancomycin dosing for PNA.  Patient has SBO, currently on TPN.  Antibiotics being started for possible PNA. MD ordered cefepime.  Today, 11/15/17  WBC 14.6  Afebrile  Wt = 87 kg, CrCl ~70 mL/min  Plan:  Vancomycin 1750 mg LD (~20 mg/kg) followed by vancomycin 1500 mg IV q24h  Goal AUC 400-500  Closely monitor renal function  Check vancomycin level once at steady state  MRSA PCR ordered - recommend considering d/c vancomycin if negative and respiratory is only suspected source of infection   Height: 6\' 2"  (188 cm) Weight: 192 lb 7.4 oz (87.3 kg) IBW/kg (Calculated) : 82.2  Temp (24hrs), Avg:98.5 F (36.9 C), Min:97.5 F (36.4 C), Max:99.5 F (37.5 C)  Recent Labs  Lab 11/11/17 1819 11/12/17 0505 11/13/17 0525 11/14/17 0544 11/15/17 0500  WBC 9.0  --  7.4 7.2 14.6*  CREATININE 0.86 0.90 0.90 0.80 0.89    Estimated Creatinine Clearance: 74.4 mL/min (by C-G formula based on SCr of 0.89 mg/dL).    Allergies  Allergen Reactions  . Bee Venom Anaphylaxis  . Tamsulosin Other (See Comments)    "Makes me feel weird"   Antimicrobials this admission: cefepime 7/10 >>  vancomycin 7/10 >>  Fluconazole 7/3 >> 7/4  Dose adjustments this admission:  Microbiology results: 7/10 BCx: Sent 7/10 UCx: Sent  7/10 Sputum: Sent  7/10 MRSA PCR: Sent  Thank you for allowing pharmacy to be a part of this patient's care.  Theodis Shove, PharmD, BCPS Clinical Pharmacist Pager: [phone] 11/15/17 8:00 PM  Addendum: Cefepime discontinued and piperacillin/tazobactam pharmacy to dose consult entered. Piperacillin/tazobactam 3.375 g IV q8h EI  Theodis Shove, PharmD, BCPS Clinical Pharmacist 11/15/17 9:40 PM

## 2017-11-15 NOTE — Progress Notes (Signed)
Was called by RN that patient in acute respiratory distress and noted to have a fever of 101 with some emesis noted.  When I saw patient patient in acute respiratory distress. General: Patient visibly short of breath with some use of accessory muscles of respiration. Respirations: Diffuse coarse rhonchorous wet breath sounds.  No wheezing noted.  Some diffuse rhonchi.  Use of accessory muscles of respiration Cardiovascular: Tachycardic.  No murmurs rubs or gallops abdomen: Soft, nontender, nondistended, positive bowel sounds extremities: No clubbing cyanosis or edema  Assessment/plan 1.  Acute respiratory distress with hypoxia Questionable etiology.  Concern for volume overload versus H CAP versus aspiration pneumonia as patient noted to have emesis with a fever with a temp of 101 with worsening shortness of breath.  Patient with diffuse rhonchorous coarse wet breath sounds.  Concern for volume overload.  Patient is +10 L during this hospitalization.  And noted to have sats of 70% on room air.  No weight checked for today.  We will get a stat chest x-ray, compressive metabolic profile, CBC with differential, magnesium level, BNP, ABG, abdominal films, blood cultures x2, urine Legionella antigen, urine pneumococcus antigen, sputum Gram stain and culture..  Place on Lasix 40 mg IV every 12 hours.  Strict I's and O's.  Daily weights.  Place Foley catheter.  Place empirically on IV vancomycin and IV cefepime.  Saline lock IV fluids.  Transfer to ICU.  Follow.  2.  Sinus tachycardia Likely secondary to problem #1.  Check a EKG.  Supportive care.  Follow.

## 2017-11-15 NOTE — Progress Notes (Signed)
[facility] Progress Note     Subjective: CC: sbo Patient appropriate and knows where he is but seems mildly confused. Abdomen non-tender. Had some bowel movements. Wants to get this resolved so he can get home.   Objective: Vital signs in last 24 hours: Temp:  [98.7 F (37.1 C)-98.8 F (37.1 C)] 98.8 F (37.1 C) (07/09 1949) Pulse Rate:  [81-103] 81 (07/09 1949) Resp:  [24] 24 (07/09 1949) BP: (127-160)/(67-77) 160/67 (07/09 1949) SpO2:  [91 %-95 %] 91 % (07/09 1949) Weight:  [87.3 kg (192 lb 7.4 oz)] 87.3 kg (192 lb 7.4 oz) (07/09 1038) Last BM Date: 11/14/17  Intake/Output from previous day: 07/09 0701 - 07/10 0700 In: 1701.2 [I.V.:1701.2] Out: 100 [Urine:100] Intake/Output this shift: Total I/O In: -  Out: 100 [Urine:100]  PE: Gen:  Alert, NAD, pleasant Card:  Regular rate and rhythm, pedal pulses 2+ BL Pulm:  Normal effort, clear to auscultation bilaterally Abd: Soft, non-tender, non-distended, bowel sounds hypoactive, no HSM, surgical scar present  Skin: warm and dry, no rashes  Psych: A&Ox3   Lab Results:  Recent Labs    11/14/17 0544 11/15/17 0500  WBC 7.2 14.6*  HGB 12.0* 10.4*  HCT 36.1* 30.4*  PLT 342 304   BMET Recent Labs    11/14/17 0544 11/15/17 0500  NA 141 141  K 3.4* 3.5  CL 106 108  CO2 24 27  GLUCOSE 82 242*  BUN 15 15  CREATININE 0.80 0.89  CALCIUM 8.4* 7.9*   PT/INR No results for input(s): LABPROT, INR in the last 72 hours. CMP     Component Value Date/Time   NA 141 11/15/2017 0500   K 3.5 11/15/2017 0500   CL 108 11/15/2017 0500   CO2 27 11/15/2017 0500   GLUCOSE 242 (H) 11/15/2017 0500   BUN 15 11/15/2017 0500   CREATININE 0.89 11/15/2017 0500   CREATININE 1.13 12/07/2012 1147   CALCIUM 7.9 (L) 11/15/2017 0500   PROT 5.1 (L) 11/15/2017 0500   ALBUMIN 2.5 (L) 11/15/2017 0500   AST 23 11/15/2017 0500   ALT 19 11/15/2017 0500   ALKPHOS 40 11/15/2017 0500   BILITOT 2.3 (H) 11/15/2017 0500   GFRNONAA  >60 11/15/2017 0500   GFRAA >60 11/15/2017 0500   Lipase     Component Value Date/Time   LIPASE 22 11/06/2017 0735       Studies/Results: Dg Esophagus  Result Date: 11/13/2017 CLINICAL DATA:  84 year old male with esophageal cancer post gastric pull-up. Small bowel obstruction. Initial encounter. EXAM: ESOPHOGRAM/BARIUM SWALLOW TECHNIQUE: Single contrast examination was performed using thin barium per request of Dr. Hassell Done. : Fluoroscopy Time:  2 minutes and 6 seconds. Radiation Exposure Index: 20 mGy. COMPARISON:  11/06/2017 CT abdomen and pelvis. FINDINGS: Transient laryngeal penetration without aspiration. Post esophagectomy with gastric pull up. Smooth narrowing at the anastomotic site (proximal 1/3 of the native esophagus). No evidence of irregularity or leak at the anastomotic site. Slight delayed emptying from the stomach into dilated small bowel loops consistent with patient's small-bowel obstruction. IMPRESSION: Post partial esophagectomy with gastric pull up. Smooth appearance of the anastomotic site without irregularity, stricture or leak identified. Delayed emptying from the gastric pull up into dilated small bowel loops consistent with patient's small-bowel obstruction. Transient laryngeal penetration. Electronically Signed   By: Genia Del M.D.   On: 11/13/2017 14:04   Dg Abd 2 Views  Result Date: 11/14/2017 CLINICAL DATA:  Follow-up small bowel obstruction. Abdominal distension. EXAM: ABDOMEN - 2 VIEW COMPARISON:  11/11/2017 and multiple previous FINDINGS: Gastrointestinal contrast is present. There is a hiatal hernia. Continued partial small bowel obstruction pattern with dilated fluid filled small intestine. Contrast does reach the nondistended colon. Diverticula are noted in the left colon. No free air. IMPRESSION: Persistent dilated fluid-filled small intestine consistent with ongoing partial small bowel obstruction. Hiatal hernia also noted. Contrast does reach the nondilated  colon. Electronically Signed   By: Nelson Chimes M.D.   On: 11/14/2017 10:23   [place] Ekg Site Rite  Result Date: 11/14/2017 If Site Rite image not attached, placement could not be confirmed due to current cardiac rhythm.   Anti-infectives: Anti-infectives (From admission, onward)   Start     Dose/Rate Route Frequency Ordered Stop   11/08/17 1200  fluconazole (DIFLUCAN) IVPB 100 mg  Status:  Discontinued     100 mg 50 mL/hr over 60 Minutes Intravenous Every 24 hours 11/08/17 1032 11/10/17 0934       Assessment/Plan Hx of esophageal CA - s/p esophagectomy 2001_ [place] [facility] T2DM HLD  BPH Dysphagia  SBO- admitted 11/06/17 - WI:OMBTDHR fluid-filled small bowel loops are noted in the abdomen and pelvis. Distal small bowel loops are decompressed. Findings compatible with small-bowel obstruction. Transition point appears to be in the midline of the pelvis -NGT unable to be placed in radiology -KUB yesterday showed persistent SBO - patient starting to have some bowel movements - we will follow- refused surgery x 2 last week with Dr. Marlou Starks Hypokalemia-K 3.5, goal of 4.0, replacement per pharmacy  Hypomagnesemia - Mag 1.6, replacement per pharmacy  Moderate Malnutrition related to chronic illness - Prealbumin 6.1 , TPN started yesterday   FEN: NPO - sips/chips; IVF, TPN   VTE: SCDs, lovenox ID: diflucan 7/3>7/5  Plan: TPN, replace lytes. Mobilize as able, starting to have some bowel function.      LOS: 9 days    Brigid Re , [facility] 11/15/2017, 10:32 AM Pager: [phone] Consults:[phone] Mon-Fri 7:00 am-4:30 pm Sat-Sun 7:00 am-11:30 am

## 2017-11-15 NOTE — Progress Notes (Addendum)
Dr. Grandville Silos gave NP report about events at shift change. NP to f/up on pt and testing. NP to bedside. S: denies any pain. Says he is no longer nauseated. Says he breathing is "better than it was".  O: Chronically ill elderly male in NAD. BP 104. Alert, talking appropriately. saO2 on NRB 98%, downgraded to 55% ventimask and O2 sat 93%. RRR. Respirations even, unlabored. Lungs with mild wheezing at bases with crackles. Non wet sounding. Has had 4-500cc urine output since 40mg  IV Lasix. Bowel sounds hypoactive to none.  A/P: 1. Acute respiratory distress after vomiting. Strict NPO now. Will continue to try and wean O2 tonight. Awaiting CXR.  2. SBO-has made some progress with BMs, but concerned about vomiting tonight. KUB pending. Radiology was unable to place NGT. Abd is not very distended or taunt.  3. Fever-? Reason. Treated empirically for pneumonia. Hopefully, he did not aspirate. Awaiting CXR. Blood cultures pending, CBC with diff. Urine antigen for strep and legionella ordered.  4. ? Volume overload-good response to Lasix. Saline locked fluids. BNP pending. Condom cath is fine for now.  Will follow for labs and xrays. Discussed plan with family at bedside. Aware of NPO status and why.  KJKG, NP [facility] Update: CXR with aspiration. Cefepime changed to Zosyn. CMP with slightly bumped creat. CBC with higher WBCC and neutrophil count. KUB with ongoing SBO. BNP 566=cont Lasix.  KJKG, NP [facility]  Total critical care time: 30 minutes Critical care time was exclusive of separately billable procedures and treating other patients. Critical care was necessary to treat or prevent imminent or life-threatening deterioration. Critical care was time spent personally by me on the following activities: development of treatment plan with patient and/or surrogate as well as nursing, discussions with consultants, evaluation of patient's response to treatment, examination of patient, obtaining history from patient or  surrogate, ordering and performing treatments and interventions, ordering and review of laboratory studies, ordering and review of radiographic studies, pulse oximetry and re-evaluation of patient's condition.

## 2017-11-15 NOTE — Progress Notes (Addendum)
PHARMACY - ADULT TOTAL PARENTERAL NUTRITION CONSULT NOTE   Pharmacy Consult for TPN Indication: bowel obstruction  Patient Measurements: Height: '6\' 2"'  (188 cm) Weight: 192 lb 7.4 oz (87.3 kg) IBW/kg (Calculated) : 82.2 TPN AdjBW (KG): 88.9 Body mass index is 24.71 kg/m.  Insulin Requirements: 5 units via sliding scale since TPN initiation   Current Nutrition: NPO except for ice chips  IVF: NS with 70mq KCl/L at 773mhr   Central access: PICC placed 7/9 TPN start date: 7/9  ASSESSMENT                                                                                                          HPI:  [id number] with PMH of HTN, remote esophageal cancer in remission, OSA, DM controlled with diet and exercise who [facility] with abdominal pain, n/v and found to have small bowel obstruction. Abdominal film today c/w ongoing partial small bowel obstruction. Pharmacy consulted to start TPN.   Significant events:   Today:    Glucose - CBGs 93-242 (Goal < 150)  Electrolytes - K+ low at 3.5 (goal =/> 4), Mag low at 1.6, Phos low at 1.8, Corrected Ca and Na+ WNL  Renal - SCr WNL  LFTs - AST/ALT, Alk Phos WNL. Tbili elevated/increased to 2.3  TGs - 102  Prealbumin - 6.1 (7/9)  NUTRITIONAL GOALS                                                                                             RD recs: Kcal:  2400-2600 kcal  Protein:  120-130 g  Clinimix 5/20 at a goal rate of 10064mr + 20% fat emulsion at 51m39m x 12h to provide: 120 g/day protein, 2592 kcal/day.  PLAN             Magnesium Sulfate 4g IV x 1  KCl 10mE40m x 2 runs  KPhos 51mmo76m x 1    Adjust to moderate SSI with CBGs q6h.                                                                                                         At 1800 today:  Continue Clinimix E 5/20 at 40ml/h11mill not advance today  due to elevated CBGs, electrolyte abnormalities.   20% fat emulsion at 89m/hr x 12h.  Plan  to advance as tolerated to the goal rate.  TPN to contain standard multivitamins and trace elements. Monitor Tbili closely.   Continue famotidine 437min TPN bag.    Continue IVF at current rate of 7551mr.   TPN lab panels in AM and on Mondays & Thursdays.  F/u daily.   JigLindell SparharmD, BCPS Pager: [phone] 7:38 AM

## 2017-11-15 NOTE — Progress Notes (Signed)
PROGRESS NOTE  FIN HUPP HWE:[id number] DOB: [birth year] DOA: 11/06/2017 PCP: Deland Pretty, MD  HPI/Recap of past 21 hours: 84 year old male with past medical history significant for hypertension, esophageal cancer status post esophagectomy in remission, presents to the ER complaining of abdominal pain, nausea, vomiting.  Patient was noted to have SBO.  Patient noted to have refused surgical intervention earlier during the admission, but now is agreeable.  Surgery is on board and currently managing conservatively.  Today, patient denies any worsening abdominal pain, nausea/vomiting, fever/chills, chest pain.  Patient reported having a bowel movement.  Assessment/Plan: Principal Problem:   SBO (small bowel obstruction) (HCC) Active Problems:   HTN (hypertension)   OSA (obstructive sleep apnea)  Small bowel obstruction Serial abdominal x-ray revealed persistent SBO General surgery on board, managing conservatively for now, continue TPN Pharmacy to dose TPN, replace electrolytes accordingly Continue IV fluids Monitor closely  Electrolyte abnormalities Hypokalemia, hypomagnesemia Replace as needed  Hypertension Continue IV hydralazine, Lopressor  OSA Continue CPAP  Hyperglycemia Likely due to TPN SSI   Code Status: Full  Family Communication: None at bedside  Disposition Plan: Once management is complete   Consultants:  General surgery  Procedures:  None  Antimicrobials:  None  DVT prophylaxis: Enoxaparin   Objective: Vitals:   11/14/17 0418 11/14/17 1038 11/14/17 1327 11/14/17 1949  BP: (!) 150/72  127/77 (!) 160/67  Pulse: 85  (!) 103 81  Resp: 20  (!) 24 (!) 24  Temp: 98.6 F (37 C)  98.7 F (37.1 C) 98.8 F (37.1 C)  TempSrc:   Oral Oral  SpO2: 96%  95% 91%  Weight:  87.3 kg (192 lb 7.4 oz)    Height:        Intake/Output Summary (Last 24 hours) at 11/15/2017 1345 Last data filed at 11/15/2017 1010 Gross per 24 hour  Intake 1807 ml    Output 100 ml  Net 1707 ml   Filed Weights   11/06/17 0651 11/14/17 1038  Weight: 88.9 kg (196 lb) 87.3 kg (192 lb 7.4 oz)    Exam:   General: NAD  Cardiovascular: S1, S2 present  Respiratory: CTAB  Abdomen: Soft, nontender, nondistended, bowel sounds hypoactive  Musculoskeletal: No pedal edema bilaterally  Skin: Normal  Psychiatry: Normal mood   Data Reviewed: CBC: Recent Labs  Lab 11/11/17 1819 11/13/17 0525 11/14/17 0544 11/15/17 0500  WBC 9.0 7.4 7.2 14.6*  NEUTROABS 6.6  --   --  12.2*  HGB 13.3 12.1* 12.0* 10.4*  HCT 39.7 36.4* 36.1* 30.4*  MCV 96.6 96.6 96.3 96.2  PLT 288 319 342 893   Basic Metabolic Panel: Recent Labs  Lab 11/11/17 1819 11/12/17 0505 11/13/17 0525 11/14/17 0544 11/15/17 0500  NA 141 144 142 141 141  K 3.7 3.5 3.4* 3.4* 3.5  CL 107 107 104 106 108  CO2 23 26 24 24 27   GLUCOSE 106* 110* 89 82 242*  BUN 19 19 17 15 15   CREATININE 0.86 0.90 0.90 0.80 0.89  CALCIUM 8.7* 8.9 8.6* 8.4* 7.9*  MG  --   --  1.6* 1.8 1.6*  PHOS  --   --   --  2.9 1.8*   GFR: Estimated Creatinine Clearance: 74.4 mL/min (by C-G formula based on SCr of 0.89 mg/dL). Liver Function Tests: Recent Labs  Lab 11/14/17 0544 11/15/17 0500  AST 16 23  ALT 14 19  ALKPHOS 38 40  BILITOT 1.9* 2.3*  PROT 6.0* 5.1*  ALBUMIN 3.1* 2.5*  No results for input(s): LIPASE, AMYLASE in the last 168 hours. No results for input(s): AMMONIA in the last 168 hours. Coagulation Profile: No results for input(s): INR, PROTIME in the last 168 hours. Cardiac Enzymes: No results for input(s): CKTOTAL, CKMB, CKMBINDEX, TROPONINI in the last 168 hours. BNP (last 3 results) No results for input(s): PROBNP in the last 8760 hours. HbA1C: No results for input(s): HGBA1C in the last 72 hours. CBG: Recent Labs  Lab 11/08/17 2046 11/14/17 1809 11/15/17 0015 11/15/17 0625 11/15/17 1102  GLUCAP 136* 93 177* 242* 170*   Lipid Profile: Recent Labs    11/15/17 0500   TRIG 102   Thyroid Function Tests: No results for input(s): TSH, T4TOTAL, FREET4, T3FREE, THYROIDAB in the last 72 hours. Anemia Panel: No results for input(s): VITAMINB12, FOLATE, FERRITIN, TIBC, IRON, RETICCTPCT in the last 72 hours. Urine analysis:    Component Value Date/Time   COLORURINE YELLOW 11/06/2017 0715   APPEARANCEUR CLEAR 11/06/2017 0715   LABSPEC 1.024 11/06/2017 0715   PHURINE 6.0 11/06/2017 0715   GLUCOSEU NEGATIVE 11/06/2017 0715   HGBUR SMALL (A) 11/06/2017 0715   BILIRUBINUR NEGATIVE 11/06/2017 0715   KETONESUR 5 (A) 11/06/2017 0715   PROTEINUR NEGATIVE 11/06/2017 0715   UROBILINOGEN 0.2 09/12/2014 1816   NITRITE NEGATIVE 11/06/2017 0715   LEUKOCYTESUR NEGATIVE 11/06/2017 0715   Sepsis Labs: @LABRCNTIP (procalcitonin:4,lacticidven:4)  )No results found for this or any previous visit (from the past 240 hour(s)).    Studies: No results found.  Scheduled Meds: . enoxaparin (LOVENOX) injection  40 mg Subcutaneous Daily  . insulin aspart  0-15 Units Subcutaneous Q6H  . metoCLOPramide (REGLAN) injection  5 mg Intravenous Q6H  . metoprolol tartrate  10 mg Intravenous Q6H    Continuous Infusions: . 0.9 % NaCl with KCl 40 mEq / L 75 mL/hr (11/14/17 1824)  . [name] KitchenTPN (CLINIMIX-E) Adult     And  . Fat emulsion    . potassium PHOSPHATE IVPB (in mmol) 20 mmol (11/15/17 1153)  . [name] KitchenTPN (CLINIMIX-E) Adult Stopped (11/15/17 0500)     LOS: 9 days     Alma Friendly, MD [facility]   If 7PM-7AM, please contact night-coverage [email or web address] Password [email or web address] 11/15/2017, 1:45 PM

## 2017-11-16 ENCOUNTER — Inpatient Hospital Stay (HOSPITAL_COMMUNITY): Payer: Medicare Other

## 2017-11-16 DIAGNOSIS — N179 Acute kidney failure, unspecified: Secondary | ICD-10-CM

## 2017-11-16 LAB — BLOOD CULTURE ID PANEL (REFLEXED)
ACINETOBACTER BAUMANNII: NOT DETECTED
CANDIDA ALBICANS: NOT DETECTED
CANDIDA GLABRATA: NOT DETECTED
CANDIDA PARAPSILOSIS: NOT DETECTED
CANDIDA TROPICALIS: NOT DETECTED
Candida krusei: NOT DETECTED
Carbapenem resistance: NOT DETECTED
ENTEROBACTERIACEAE SPECIES: DETECTED — AB
ESCHERICHIA COLI: NOT DETECTED
Enterobacter cloacae complex: NOT DETECTED
Enterococcus species: NOT DETECTED
Haemophilus influenzae: NOT DETECTED
KLEBSIELLA PNEUMONIAE: NOT DETECTED
Klebsiella oxytoca: DETECTED — AB
Listeria monocytogenes: NOT DETECTED
Neisseria meningitidis: NOT DETECTED
PROTEUS SPECIES: NOT DETECTED
PSEUDOMONAS AERUGINOSA: NOT DETECTED
STREPTOCOCCUS PYOGENES: NOT DETECTED
Serratia marcescens: NOT DETECTED
Staphylococcus aureus (BCID): NOT DETECTED
Staphylococcus species: NOT DETECTED
Streptococcus agalactiae: NOT DETECTED
Streptococcus pneumoniae: NOT DETECTED
Streptococcus species: NOT DETECTED

## 2017-11-16 LAB — PHOSPHORUS: Phosphorus: 3.5 mg/dL (ref 2.5–4.6)

## 2017-11-16 LAB — COMPREHENSIVE METABOLIC PANEL
ALBUMIN: 2.6 g/dL — AB (ref 3.5–5.0)
ALT: 27 U/L (ref 0–44)
AST: 36 U/L (ref 15–41)
Alkaline Phosphatase: 57 U/L (ref 38–126)
Anion gap: 9 (ref 5–15)
BUN: 22 mg/dL (ref 8–23)
CHLORIDE: 108 mmol/L (ref 98–111)
CO2: 23 mmol/L (ref 22–32)
Calcium: 7.9 mg/dL — ABNORMAL LOW (ref 8.9–10.3)
Creatinine, Ser: 1.31 mg/dL — ABNORMAL HIGH (ref 0.61–1.24)
GFR calc Af Amer: 57 mL/min — ABNORMAL LOW (ref >60)
GFR calc non Af Amer: 49 mL/min — ABNORMAL LOW (ref >60)
GLUCOSE: 189 mg/dL — AB (ref 70–99)
POTASSIUM: 4 mmol/L (ref 3.5–5.1)
Sodium: 140 mmol/L (ref 135–145)
Total Bilirubin: 2.9 mg/dL — ABNORMAL HIGH (ref 0.3–1.2)
Total Protein: 5.5 g/dL — ABNORMAL LOW (ref 6.5–8.1)

## 2017-11-16 LAB — GLUCOSE, CAPILLARY
GLUCOSE-CAPILLARY: 188 mg/dL — AB (ref 70–99)
GLUCOSE-CAPILLARY: 254 mg/dL — AB (ref 70–99)
Glucose-Capillary: 169 mg/dL — ABNORMAL HIGH (ref 70–99)
Glucose-Capillary: 174 mg/dL — ABNORMAL HIGH (ref 70–99)
Glucose-Capillary: 203 mg/dL — ABNORMAL HIGH (ref 70–99)

## 2017-11-16 LAB — PROCALCITONIN: PROCALCITONIN: 52.49 ng/mL

## 2017-11-16 LAB — BRAIN NATRIURETIC PEPTIDE: B Natriuretic Peptide: 863 pg/mL — ABNORMAL HIGH (ref 0.0–100.0)

## 2017-11-16 LAB — HIV ANTIBODY (ROUTINE TESTING W REFLEX): HIV Screen 4th Generation wRfx: NONREACTIVE

## 2017-11-16 LAB — MAGNESIUM: MAGNESIUM: 1.9 mg/dL (ref 1.7–2.4)

## 2017-11-16 MED ORDER — MAGNESIUM SULFATE 2 GM/50ML IV SOLN
2.0000 g | Freq: Once | INTRAVENOUS | Status: AC
  Administered 2017-11-16: 2 g via INTRAVENOUS
  Filled 2017-11-16: qty 50

## 2017-11-16 MED ORDER — FUROSEMIDE 10 MG/ML IJ SOLN: 40.0000 mg | Freq: Every day | INTRAMUSCULAR | Status: DC

## 2017-11-16 MED ORDER — IPRATROPIUM-ALBUTEROL 0.5-2.5 (3) MG/3ML IN SOLN
3.0000 mL | Freq: Three times a day (TID) | RESPIRATORY_TRACT | Status: DC
  Administered 2017-11-17: 3 mL via RESPIRATORY_TRACT
  Filled 2017-11-16 (×2): qty 3

## 2017-11-16 MED ORDER — CHLORHEXIDINE GLUCONATE CLOTH 2 % EX PADS
6.0000 | MEDICATED_PAD | Freq: Every day | CUTANEOUS | Status: DC
  Administered 2017-11-16 – 2017-11-23 (×8): 6 via TOPICAL

## 2017-11-16 MED ORDER — IPRATROPIUM-ALBUTEROL 0.5-2.5 (3) MG/3ML IN SOLN: 3.0000 mL | Freq: Three times a day (TID) | RESPIRATORY_TRACT | Status: DC

## 2017-11-16 MED ORDER — ORAL CARE MOUTH RINSE
15.0000 mL | Freq: Two times a day (BID) | OROMUCOSAL | Status: DC
  Administered 2017-11-16 – 2017-11-23 (×11): 15 mL via OROMUCOSAL

## 2017-11-16 MED ORDER — SODIUM CHLORIDE 0.9 % IV SOLN
2.0000 g | INTRAVENOUS | Status: DC
  Administered 2017-11-16: 2 g via INTRAVENOUS
  Filled 2017-11-16: qty 20
  Filled 2017-11-16: qty 2

## 2017-11-16 MED ORDER — IPRATROPIUM-ALBUTEROL 0.5-2.5 (3) MG/3ML IN SOLN
3.0000 mL | RESPIRATORY_TRACT | Status: DC | PRN
  Administered 2017-11-16: 3 mL via RESPIRATORY_TRACT

## 2017-11-16 MED ORDER — TRACE MINERALS CR-CU-MN-SE-ZN 10-1000-500-60 MCG/ML IV SOLN
INTRAVENOUS | Status: AC
  Administered 2017-11-16: 17:00:00 via INTRAVENOUS
  Filled 2017-11-16 (×2): qty 1680

## 2017-11-16 MED ORDER — PHENOL 1.4 % MT LIQD
1.0000 | OROMUCOSAL | Status: DC | PRN
  Administered 2017-11-16: 1 via OROMUCOSAL
  Filled 2017-11-16: qty 177

## 2017-11-16 MED ORDER — FAT EMULSION PLANT BASED 20 % IV EMUL
240.0000 mL | INTRAVENOUS | Status: AC
  Administered 2017-11-16: 240 mL via INTRAVENOUS
  Filled 2017-11-16: qty 250

## 2017-11-16 NOTE — Progress Notes (Signed)
Pt. placed on CPAP after scheduled aerosol tx. due to some mild to moderate snoring noted by RT/RN has prior order placed, is easily awoken, stated, "yes" when asked if was ready for CPAP, placed on 2 lpm n/c just prior to aerosol tx, due to oxygen saturations drifting down to 88% while on room air likely due to OSA hx., RT to monitor and plan is to wean back to room air, per order.

## 2017-11-16 NOTE — Progress Notes (Addendum)
Pt asking for help to be repositioned in bed, began coughing & asked for spit cup. Was given cup & pt vomited roughly 150cc of light brown, fecal-smelling emesis. Pt reported not feeling nauseous until he began coughing, "I didn't even know I was going to vomit until I did." Pt given 4mg  Zofran, IV. Will continue to monitor.

## 2017-11-16 NOTE — Progress Notes (Signed)
PT Cancellation Note  Patient Details Name: Oscar [name] MRN: [id number] DOB: [birth year]   Cancelled Treatment:     unable to participate due to medical status change.  Aspirated yesterday and transferred to ICU.       Rica Koyanagi  PTA [facility]  Acute  Rehab Pager      [phone]

## 2017-11-16 NOTE — Progress Notes (Signed)
PHARMACY - PHYSICIAN COMMUNICATION CRITICAL VALUE ALERT - BLOOD CULTURE IDENTIFICATION (BCID)  Oscar [name] is an 84 y.o. male who presented to [facility] on 11/06/2017 with a chief complaint of SBO.  Assessment:  Klebsiella bacteremia, aspiration pneumonia  Name of physician (or Provider) Contacted: Dr Clide Cliff  Current antibiotics: Zosyn  Changes to prescribed antibiotics recommended:  Recommendations accepted by provider  Ceftriaxone 2 GM IV q24h D/C Zosyn  Results for orders placed or performed during the hospital encounter of 11/06/17  Blood Culture ID Panel (Reflexed) (Collected: 11/15/2017  9:01 PM)  Result Value Ref Range   Enterococcus species NOT DETECTED NOT DETECTED   Listeria monocytogenes NOT DETECTED NOT DETECTED   Staphylococcus species NOT DETECTED NOT DETECTED   Staphylococcus aureus NOT DETECTED NOT DETECTED   Streptococcus species NOT DETECTED NOT DETECTED   Streptococcus agalactiae NOT DETECTED NOT DETECTED   Streptococcus pneumoniae NOT DETECTED NOT DETECTED   Streptococcus pyogenes NOT DETECTED NOT DETECTED   Acinetobacter baumannii NOT DETECTED NOT DETECTED   Enterobacteriaceae species DETECTED (A) NOT DETECTED   Enterobacter cloacae complex NOT DETECTED NOT DETECTED   Escherichia coli NOT DETECTED NOT DETECTED   Klebsiella oxytoca DETECTED (A) NOT DETECTED   Klebsiella pneumoniae NOT DETECTED NOT DETECTED   Proteus species NOT DETECTED NOT DETECTED   Serratia marcescens NOT DETECTED NOT DETECTED   Carbapenem resistance NOT DETECTED NOT DETECTED   Haemophilus influenzae NOT DETECTED NOT DETECTED   Neisseria meningitidis NOT DETECTED NOT DETECTED   Pseudomonas aeruginosa NOT DETECTED NOT DETECTED   Candida albicans NOT DETECTED NOT DETECTED   Candida glabrata NOT DETECTED NOT DETECTED   Candida krusei NOT DETECTED NOT DETECTED   Candida parapsilosis NOT DETECTED NOT DETECTED   Candida tropicalis NOT DETECTED NOT DETECTED    Leeroy Bock 11/16/2017  2:27 PM

## 2017-11-16 NOTE — Progress Notes (Signed)
[facility] Progress Note     Subjective: CC: aspiration  Patient aspirated overnight, transferred to ICU. Vomit reportedly feculent smelling/appearing. Currently on venturi mask. Seems exhausted. Denies abdominal pain. Had large volume diarrhea overnight, rectal tube placed this AM. Denies nausea.   Objective: Vital signs in last 24 hours: Temp:  [97.5 F (36.4 C)-99.5 F (37.5 C)] 98.5 F (36.9 C) (07/11 0400) Pulse Rate:  [81] 81 (07/10 1354) Resp:  [21-35] 22 (07/11 0600) BP: (99-155)/(34-101) 120/101 (07/11 0600) SpO2:  [89 %-97 %] 93 % (07/11 0600) FiO2 (%):  [100 %] 100 % (07/10 1930) Weight:  [88.2 kg (194 lb 7.1 oz)-89.9 kg (198 lb 3.1 oz)] 89.9 kg (198 lb 3.1 oz) (07/11 0457) Last BM Date: 11/15/17  Intake/Output from previous day: 07/10 0701 - 07/11 0700 In: 2894.8 [I.V.:1885; IV Piggyback:1009.8] Out: 8527 [Urine:620; Emesis/NG output:575] Intake/Output this shift: No intake/output data recorded.  PE: Gen:  tired but easily arousable Card:  Regular rate and rhythm, pedal pulses 2+ BL Pulm:  tachypneic, O2 sat 96% on venturi mask, basilar rales bilaterally, mild wheezing bilaterally Abd: Soft, non-tender, non-distended, bowel sounds hypoactive, no HSM, surgical scar present  Skin: warm and dry, no rashes  Psych: A&Ox3    Lab Results:  Recent Labs    11/15/17 0500 11/15/17 2121  WBC 14.6* 16.5*  HGB 10.4* 10.4*  HCT 30.4* 30.9*  PLT 304 241   BMET Recent Labs    11/15/17 2121 11/16/17 0516  NA 142 140  K 3.5 4.0  CL 110 108  CO2 23 23  GLUCOSE 188* 189*  BUN 20 22  CREATININE 1.35* 1.31*  CALCIUM 8.1* 7.9*   PT/INR No results for input(s): LABPROT, INR in the last 72 hours. CMP     Component Value Date/Time   NA 140 11/16/2017 0516   K 4.0 11/16/2017 0516   CL 108 11/16/2017 0516   CO2 23 11/16/2017 0516   GLUCOSE 189 (H) 11/16/2017 0516   BUN 22 11/16/2017 0516   CREATININE 1.31 (H) 11/16/2017 0516   CREATININE 1.13  12/07/2012 1147   CALCIUM 7.9 (L) 11/16/2017 0516   PROT 5.5 (L) 11/16/2017 0516   ALBUMIN 2.6 (L) 11/16/2017 0516   AST 36 11/16/2017 0516   ALT 27 11/16/2017 0516   ALKPHOS 57 11/16/2017 0516   BILITOT 2.9 (H) 11/16/2017 0516   GFRNONAA 49 (L) 11/16/2017 0516   GFRAA 57 (L) 11/16/2017 0516   Lipase     Component Value Date/Time   LIPASE 22 11/06/2017 0735       Studies/Results: Dg Chest Port 1 View  Result Date: 11/15/2017 CLINICAL DATA:  Acute respiratory distress. Fever. Sudden episode of vomiting EXAM: PORTABLE CHEST 1 VIEW COMPARISON:  Three days ago FINDINGS: Airspace opacity in the right upper and left lower lobes. Cardiomegaly. Status post esophagectomy. High-density material still seen within the pull-through, with barium study 2 days ago. PICC on the right with tip at the SVC. IMPRESSION: 1. Bilateral airspace disease primarily concerning for aspiration in this clinical setting. 2. Esophagram 2 days ago with small volume high-density material still seen within the gastric pull-through. Electronically Signed   By: Monte Fantasia M.D.   On: 11/15/2017 20:45   Dg Abd 2 Views  Result Date: 11/14/2017 CLINICAL DATA:  Follow-up small bowel obstruction. Abdominal distension. EXAM: ABDOMEN - 2 VIEW COMPARISON:  11/11/2017 and multiple previous FINDINGS: Gastrointestinal contrast is present. There is a hiatal hernia. Continued partial small bowel obstruction pattern with dilated  fluid filled small intestine. Contrast does reach the nondistended colon. Diverticula are noted in the left colon. No free air. IMPRESSION: Persistent dilated fluid-filled small intestine consistent with ongoing partial small bowel obstruction. Hiatal hernia also noted. Contrast does reach the nondilated colon. Electronically Signed   By: Nelson Chimes M.D.   On: 11/14/2017 10:23   Dg Abd Portable 1v  Result Date: 11/15/2017 CLINICAL DATA:  Acute respiratory distress EXAM: PORTABLE ABDOMEN - 1 VIEW COMPARISON:   Yesterday FINDINGS: Dilated small bowel with oral contrast seen diffusely. Under distended colon also has contrast. No concerning mass effect. IMPRESSION: Ongoing small bowel obstruction, partial based on colonic opacification. Electronically Signed   By: Monte Fantasia M.D.   On: 11/15/2017 22:13   [place] Ekg Site Rite  Result Date: 11/14/2017 If Site Rite image not attached, placement could not be confirmed due to current cardiac rhythm.   Anti-infectives: Anti-infectives (From admission, onward)   Start     Dose/Rate Route Frequency Ordered Stop   11/16/17 2100  vancomycin (VANCOCIN) 1,500 mg in sodium chloride 0.9 % 500 mL IVPB     1,500 mg 250 mL/hr over 120 Minutes Intravenous Every 24 hours 11/15/17 1951     11/15/17 2200  piperacillin-tazobactam (ZOSYN) IVPB 3.375 g     3.375 g 12.5 mL/hr over 240 Minutes Intravenous Every 8 hours 11/15/17 2137     11/15/17 2030  vancomycin (VANCOCIN) 1,750 mg in sodium chloride 0.9 % 500 mL IVPB     1,750 mg 250 mL/hr over 120 Minutes Intravenous  Once 11/15/17 1948 11/15/17 2330   11/15/17 2000  ceFEPIme (MAXIPIME) 1 g in sodium chloride 0.9 % 100 mL IVPB  Status:  Discontinued     1 g 200 mL/hr over 30 Minutes Intravenous Every 8 hours 11/15/17 1932 11/15/17 2132   11/08/17 1200  fluconazole (DIFLUCAN) IVPB 100 mg  Status:  Discontinued     100 mg 50 mL/hr over 60 Minutes Intravenous Every 24 hours 11/08/17 1032 11/10/17 0934       Assessment/Plan Hx of esophageal CA - s/p esophagectomy 2001_ [facility] HTN T2DM HLD  BPH Dysphagia  SBO- admitted 11/06/17 - [id number] fluid-filled small bowel loops are noted in the abdomen and pelvis. Distal small bowel loops are decompressed. Findings compatible with small-bowel obstruction. Transition point appears to be in the midline of the pelvis -NGT unable to be placed in radiology, pt also refused placement intermittently - Pt with multiple episodes emesis overnight and concern for aspiration   -KUB overnight with persistent SBO - patient with large volume diarrhea overnight, repeat KUB later today - we will follow- refused surgery x 2 last week with Dr. Marlou Starks Aspiration - CXR last night with bilateral airspace disease  - agree with IV abx - monitor respiratory status closely and repeat CXR this afternoon  Hypokalemia-K 4.0, improved Hypomagnesemia - Mag 1.9, improved from 1.6 yesterday   ModerateMalnutrition related to chronic illness- Prealbumin 6.1, TPN started yesterday   FEN: NPO; IVF, TPN   VTE: SCDs, lovenox ID: [id number] 7/3>7/5  Plan: Monitor respiratory status and treat for potential aspiration PNA. Repeat films later today.   If SBO continuing to persist patient will need exploratory surgery, but I am concerned that he will refuse this. I am also concerned that nutritional status and recent aspiration may complicate recovery if patient does undergo an operation.     LOS: 10 days    Brigid Re , [facility] 11/16/2017, 7:38 AM Pager: [phone]  Consults: [phone] Mon-Fri 7:00 am-4:30 pm Sat-Sun 7:00 am-11:30 am

## 2017-11-16 NOTE — Progress Notes (Signed)
PROGRESS NOTE  Oscar [name] XBJ:[id number] DOB: September 01, 1932 DOA: 11/06/2017 PCP: Deland Pretty, MD  HPI/Recap of past 104 hours: 84 year old male with past medical history significant for hypertension, esophageal cancer status post esophagectomy in remission, presents to the ER complaining of abdominal pain, nausea, vomiting.  Patient was noted to have SBO.  Patient noted to have refused surgical intervention earlier during the admission, but now is agreeable.  Surgery is on board and currently managing conservatively.  Overnight, pt was transferred to SDU due to acute hypoxic respiratory likely due to aspiration pneumonia and concern for sepsis. Today, patient noted to still require O2, but currently improving on O2 via [place]. Pt noted to have some fecal vomitus and large volume diarrhea. Rectal tube placed, pt still refusing NG tube. Denies any new complaints, wants to have some ice-chips.  Assessment/Plan: Principal Problem:   SBO (small bowel obstruction) (HCC) Active Problems:   HTN (hypertension)   OSA (obstructive sleep apnea)   Acute on chronic respiratory failure with hypoxia (HCC)  Sepsis with Klebsiella bacteremia Likely source due to aspiration pna due to SBO Currently afebrile with leukocytosis BC growing klebsiella, will repeat in am UA positive for RBC, UC pending  Procalcitonin pending D/C Vanc + Zosyn Start IV Ceftriaxone 2G daily Spoke to ID Dr Megan Salon, rec to continue ceftriaxone and monitor closely. No need to remove PICC line and can continue TPN Monitor closley  Acute hypoxic respiratory distress Improving Likely 2/2 aspiration pna due to SBO Currently on supplemental O2 by [place], saturating well, no increased WOB CXR showed R lung opacity Urine strep pneumo negative, legionella pending Continue Ceftriaxone Supplemental O2, incentive spirometry, duonebs  AKI Likely due to sepsis/bacteremia Due to acute respiratory distress, will hold off IVF Gave 2 doses of  lasix with adequate diuresis, will discontinue for now and give prn Daily BMP May need renal USS, bladder scan if worsens  Small bowel obstruction Serial abdominal x-ray revealed persistent SBO, unable to tolerate NG tube General surgery on board, managing conservatively for now, continue TPN Pharmacy to dose TPN, replace electrolytes accordingly NPO  Electrolyte abnormalities Hypokalemia, hypomagnesemia Replace as needed  Hypertension Continue IV hydralazine, Lopressor  OSA Continue CPAP  Hyperglycemia Likely due to TPN SSI   Code Status: Full  Family Communication: Wife at bedside  Disposition Plan: Once more stable   Consultants:  General surgery  Spoke to ID on 11/16/17  Procedures:  None  Antimicrobials:  IV Ceftriaxone  DVT prophylaxis: Enoxaparin   Objective: Vitals:   11/16/17 1500 11/16/17 1600 11/16/17 1700 11/16/17 1800  BP:  127/63  (!) 152/46  Pulse:      Resp: (!) 25 20 (!) 26 (!) 24  Temp:      TempSrc:      SpO2: 95% 91% 92% 90%  Weight:      Height:        Intake/Output Summary (Last 24 hours) at 11/16/2017 1843 Last data filed at 11/16/2017 1800 Gross per 24 hour  Intake 2077.29 ml  Output 2070 ml  Net 7.29 ml   Filed Weights   11/14/17 1038 11/15/17 2200 11/16/17 0457  Weight: 87.3 kg (192 lb 7.4 oz) 88.2 kg (194 lb 7.1 oz) 89.9 kg (198 lb 3.1 oz)    Exam:   General: NAD  Cardiovascular: S1, S2 present  Respiratory: Bilateral crackles noted  Abdomen: Soft, nontender, nondistended, bowel sounds hypoactive  Musculoskeletal: No pedal edema bilaterally  Skin: Normal  Psychiatry: Normal mood   Data Reviewed:  CBC: Recent Labs  Lab 11/11/17 1819 11/13/17 0525 11/14/17 0544 11/15/17 0500 11/15/17 2121  WBC 9.0 7.4 7.2 14.6* 16.5*  NEUTROABS 6.6  --   --  12.2* 14.6*  HGB 13.3 12.1* 12.0* 10.4* 10.4*  HCT 39.7 36.4* 36.1* 30.4* 30.9*  MCV 96.6 96.6 96.3 96.2 96.9  PLT 288 319 342 304 001   Basic  Metabolic Panel: Recent Labs  Lab 11/13/17 0525 11/14/17 0544 11/15/17 0500 11/15/17 2121 11/16/17 0516  NA 142 141 141 142 140  K 3.4* 3.4* 3.5 3.5 4.0  CL 104 106 108 110 108  CO2 24 24 27 23 23   GLUCOSE 89 82 242* 188* 189*  BUN 17 15 15 20 22   CREATININE 0.90 0.80 0.89 1.35* 1.31*  CALCIUM 8.6* 8.4* 7.9* 8.1* 7.9*  MG 1.6* 1.8 1.6* 2.0 1.9  PHOS  --  2.9 1.8*  --  3.5   GFR: Estimated Creatinine Clearance: 50.5 mL/min (A) (by C-G formula based on SCr of 1.31 mg/dL (H)). Liver Function Tests: Recent Labs  Lab 11/14/17 0544 11/15/17 0500 11/15/17 2121 11/16/17 0516  AST 16 23 44* 36  ALT 14 19 29 27   ALKPHOS 38 40 70 57  BILITOT 1.9* 2.3* 2.8* 2.9*  PROT 6.0* 5.1* 5.3* 5.5*  ALBUMIN 3.1* 2.5* 2.5* 2.6*   No results for input(s): LIPASE, AMYLASE in the last 168 hours. No results for input(s): AMMONIA in the last 168 hours. Coagulation Profile: No results for input(s): INR, PROTIME in the last 168 hours. Cardiac Enzymes: No results for input(s): CKTOTAL, CKMB, CKMBINDEX, TROPONINI in the last 168 hours. BNP (last 3 results) No results for input(s): PROBNP in the last 8760 hours. HbA1C: No results for input(s): HGBA1C in the last 72 hours. CBG: Recent Labs  Lab 11/15/17 1927 11/16/17 0008 11/16/17 0540 11/16/17 1154 11/16/17 1745  GLUCAP 180* 174* 169* 203* 188*   Lipid Profile: Recent Labs    11/15/17 0500  TRIG 102   Thyroid Function Tests: No results for input(s): TSH, T4TOTAL, FREET4, T3FREE, THYROIDAB in the last 72 hours. Anemia Panel: No results for input(s): VITAMINB12, FOLATE, FERRITIN, TIBC, IRON, RETICCTPCT in the last 72 hours. Urine analysis:    Component Value Date/Time   COLORURINE AMBER (A) 11/15/2017 2140   APPEARANCEUR HAZY (A) 11/15/2017 2140   LABSPEC 1.011 11/15/2017 2140   PHURINE 5.0 11/15/2017 2140   GLUCOSEU NEGATIVE 11/15/2017 2140   HGBUR LARGE (A) 11/15/2017 2140   BILIRUBINUR NEGATIVE 11/15/2017 2140   KETONESUR  NEGATIVE 11/15/2017 2140   PROTEINUR NEGATIVE 11/15/2017 2140   UROBILINOGEN 0.2 09/12/2014 1816   NITRITE NEGATIVE 11/15/2017 2140   LEUKOCYTESUR NEGATIVE 11/15/2017 2140   Sepsis Labs: @LABRCNTIP (procalcitonin:4,lacticidven:4)  ) Recent Results (from the past 240 hour(s))  MRSA PCR Screening     Status: None   Collection Time: 11/15/17  7:54 PM  Result Value Ref Range Status   MRSA by PCR NEGATIVE NEGATIVE Final    Comment:        The GeneXpert MRSA Assay (FDA approved for NASAL specimens only), is one component of a comprehensive MRSA colonization surveillance program. It is not intended to diagnose MRSA infection nor to guide or monitor treatment for MRSA infections. Performed at [facility], [place] [address] [place], [place] [address]   Culture, blood (routine x 2) Call MD if unable to obtain prior to antibiotics being given     Status: None (Preliminary result)   Collection Time: 11/15/17  9:01 PM  Result  Value Ref Range Status   Specimen Description   Final    BLOOD LEFT ANTECUBITAL Performed at [place] [address] [place], [place] [address]    Special Requests   Final    BOTTLES DRAWN AEROBIC AND ANAEROBIC Blood Culture adequate volume Performed at [place] [address] [place], [place] [address]    Culture  Setup Time   Final    GRAM NEGATIVE RODS AEROBIC BOTTLE ONLY CRITICAL RESULT CALLED TO, READ BACK BY AND VERIFIED WITH: Sheffield Slider PharmD 14:15 11/16/17 ([email or web address]) Performed at [facility], [place] [address], [place], [place] [address]    Culture GRAM NEGATIVE RODS  Final   Report Status PENDING  Incomplete  Blood Culture ID Panel (Reflexed)     Status: Abnormal   Collection Time: 11/15/17  9:01 PM  Result Value Ref Range Status   Enterococcus species NOT DETECTED NOT DETECTED Final   Listeria monocytogenes NOT DETECTED NOT DETECTED Final   Staphylococcus species NOT DETECTED  NOT DETECTED Final   Staphylococcus aureus NOT DETECTED NOT DETECTED Final   Streptococcus species NOT DETECTED NOT DETECTED Final   Streptococcus agalactiae NOT DETECTED NOT DETECTED Final   Streptococcus pneumoniae NOT DETECTED NOT DETECTED Final   Streptococcus pyogenes NOT DETECTED NOT DETECTED Final   Acinetobacter baumannii NOT DETECTED NOT DETECTED Final   Enterobacteriaceae species DETECTED (A) NOT DETECTED Final    Comment: Enterobacteriaceae represent a large family of gram-negative bacteria, not a single organism. CRITICAL RESULT CALLED TO, READ BACK BY AND VERIFIED WITH: Sheffield Slider PharmD 14:15 11/16/17 ([email or web address])    Enterobacter cloacae complex NOT DETECTED NOT DETECTED Final   Escherichia coli NOT DETECTED NOT DETECTED Final   Klebsiella oxytoca DETECTED (A) NOT DETECTED Final    Comment: CRITICAL RESULT CALLED TO, READ BACK BY AND VERIFIED WITH: Sheffield Slider PharmD 14:15 11/16/17 ([email or web address])    Klebsiella pneumoniae NOT DETECTED NOT DETECTED Final   Proteus species NOT DETECTED NOT DETECTED Final   Serratia marcescens NOT DETECTED NOT DETECTED Final   Carbapenem resistance NOT DETECTED NOT DETECTED Final   Haemophilus influenzae NOT DETECTED NOT DETECTED Final   Neisseria meningitidis NOT DETECTED NOT DETECTED Final   Pseudomonas aeruginosa NOT DETECTED NOT DETECTED Final   Candida albicans NOT DETECTED NOT DETECTED Final   Candida glabrata NOT DETECTED NOT DETECTED Final   Candida krusei NOT DETECTED NOT DETECTED Final   Candida parapsilosis NOT DETECTED NOT DETECTED Final   Candida tropicalis NOT DETECTED NOT DETECTED Final  Culture, blood (routine x 2) Call MD if unable to obtain prior to antibiotics being given     Status: None (Preliminary result)   Collection Time: 11/15/17  9:12 PM  Result Value Ref Range Status   Specimen Description   Final    BLOOD LEFT HAND Performed at [facility], [place] [address] [place], [place] [address]    Special  Requests   Final    BOTTLES DRAWN AEROBIC AND ANAEROBIC Blood Culture adequate volume Performed at [place] [address], [place], [place] [address]    Culture  Setup Time   Final    GRAM NEGATIVE RODS AEROBIC BOTTLE ONLY CRITICAL VALUE NOTED.  VALUE IS CONSISTENT WITH PREVIOUSLY REPORTED AND CALLED VALUE. Performed at [facility], [place] [address] [place], [place] [address]    Culture GRAM NEGATIVE RODS  Final   Report Status PENDING  Incomplete      Studies:  Dg Chest Port 1 View  Result Date: 11/16/2017 CLINICAL DATA:  Cough. EXAM: PORTABLE CHEST 1 VIEW COMPARISON:  Radiograph of November 15, 2017. FINDINGS: Stable cardiomegaly. Atherosclerosis of thoracic aorta is noted. Right-sided PICC line is unchanged in position. No pneumothorax is noted. No significant pleural effusion is noted. Increased right upper lobe airspace opacity is noted concerning for pneumonia. Stable bibasilar subsegmental atelectasis. IMPRESSION: Increased right upper lobe airspace opacity is noted concerning for pneumonia. Aortic Atherosclerosis (ICD10-I70.0). Electronically Signed   By: Marijo Conception, M.D.   On: 11/16/2017 13:30   Dg Chest Port 1 View  Result Date: 11/15/2017 CLINICAL DATA:  Acute respiratory distress. Fever. Sudden episode of vomiting EXAM: PORTABLE CHEST 1 VIEW COMPARISON:  Three days ago FINDINGS: Airspace opacity in the right upper and left lower lobes. Cardiomegaly. Status post esophagectomy. High-density material still seen within the pull-through, with barium study 2 days ago. PICC on the right with tip at the SVC. IMPRESSION: 1. Bilateral airspace disease primarily concerning for aspiration in this clinical setting. 2. Esophagram 2 days ago with small volume high-density material still seen within the gastric pull-through. Electronically Signed   By: Monte Fantasia M.D.   On: 11/15/2017 20:45   Dg Abd Portable 1v  Result Date: 11/16/2017 CLINICAL DATA:  Small  bowel obstruction. EXAM: PORTABLE ABDOMEN - 1 VIEW COMPARISON:  Radiographs of November 15, 2017. FINDINGS: Continued small bowel dilatation is noted. Residual contrast remains within small bowel loops as well as: Consistent with partial small bowel obstruction. Surgical clips are noted in the left upper quadrant. IMPRESSION: Findings consistent with partial small bowel obstruction. Electronically Signed   By: Marijo Conception, M.D.   On: 11/16/2017 13:29   Dg Abd Portable 1v  Result Date: 11/15/2017 CLINICAL DATA:  Acute respiratory distress EXAM: PORTABLE ABDOMEN - 1 VIEW COMPARISON:  Yesterday FINDINGS: Dilated small bowel with oral contrast seen diffusely. Under distended colon also has contrast. No concerning mass effect. IMPRESSION: Ongoing small bowel obstruction, partial based on colonic opacification. Electronically Signed   By: Monte Fantasia M.D.   On: 11/15/2017 22:13    Scheduled Meds: . Chlorhexidine Gluconate Cloth  6 each Topical Daily  . enoxaparin (LOVENOX) injection  40 mg Subcutaneous Daily  . [START ON 11/17/2017] furosemide  40 mg Intravenous Daily  . insulin aspart  0-15 Units Subcutaneous Q6H  . mouth rinse  15 mL Mouth Rinse BID  . metoCLOPramide (REGLAN) injection  5 mg Intravenous Q6H  . metoprolol tartrate  10 mg Intravenous Q6H    Continuous Infusions: . [name] KitchenTPN (CLINIMIX-E) Adult 70 mL/hr at 11/16/17 1718   And  . Fat emulsion 240 mL (11/16/17 1722)  . cefTRIAXone (ROCEPHIN)  IV       LOS: 10 days     Alma Friendly, MD [facility]   If 7PM-7AM, please contact night-coverage [email or web address] Password [facility] 11/16/2017, 6:43 PM

## 2017-11-16 NOTE — Progress Notes (Addendum)
PHARMACY - ADULT TOTAL PARENTERAL NUTRITION CONSULT NOTE   Pharmacy Consult for TPN Indication: bowel obstruction  Patient Measurements: Height: 6' 2" (188 cm) Weight: 198 lb 3.1 oz (89.9 kg) IBW/kg (Calculated) : 82.2 TPN AdjBW (KG): 88.9 Body mass index is 25.45 kg/m.  Insulin Requirements: 12 units via sliding scale   Current Nutrition: NPO except for ice chips  IVF: IVF d/c (added Lasix 16m IV q12h)  Central access: PICC placed 7/9 TPN start date: 7/9  ASSESSMENT                                                                                                          HPI:  [id number] with PMH of HTN, remote esophageal cancer in remission, OSA, DM controlled with diet and exercise [facility] with [facility] pain, n/v and found to have small bowel obstruction. Abdominal film today c/w ongoing partial small bowel obstruction. Pharmacy consulted to start TPN.   Significant events:  7/9-7/12 Reglan 5 mg q6h 7/10 Aspirated overnight, Vomit reportedly feculent smelling/appearing.  Transferred to ICU.  Today:   Glucose - CBGs 169-242 (Goal < 150)  Electrolytes - K+ improved to 4 (goal =/> 4), Mag improved to 1.9, others Na, Cl, CorrCa, Phos are WNL.  Renal - SCr bumped on 7/10, baseline ~ 0.9 increased to 1.3  LFTs - AST/ALT, Alk Phos WNL. Tbili elevated/increased to 2.9  TGs - 102 (7/10)  Prealbumin - 6.1 (7/9)  NUTRITIONAL GOALS                                                                                             RD recs (7/9): Kcal:  2400-2600 kcal , Protein:  120-130 g  Clinimix 5/20 at a goal rate of 1064mhr + 20% fat emulsion at 2080mr x 12h to provide:  120 g/day protein, 2592 kcal/day.  PLAN                                                                                                                          Now:  Magnesium Sulfate 2g IV x 1  At 1800 today:  Increase to Clinimix E  5/20 at 70 ml/hr.   20% fat emulsion at 49m/hr  x 12h.  Plan to advance as tolerated to the goal rate.  TPN to contain standard multivitamins and trace elements. Monitor Tbili closely.   Continue famotidine 463min TPN bag.    Continue moderate SSI and CBGs q6h  IVF per MD  TPN lab panels in AM and on Mondays & Thursdays.  F/u daily.  BMET, Mag, Phos ordered daily Friday-Sunday.   ChGretta ArabharmD, BCPS Pager [phone] 6660/02/2018 7:38 AM

## 2017-11-16 NOTE — Progress Notes (Signed)
Another episode of vomiting induced by coughing. Roughly 100cc, light brown, fecal-smelling. Pt still reports not feeling nauseous. Will continue to monitor.

## 2017-11-17 ENCOUNTER — Inpatient Hospital Stay (HOSPITAL_COMMUNITY): Payer: Medicare Other

## 2017-11-17 DIAGNOSIS — K56609 Unspecified intestinal obstruction, unspecified as to partial versus complete obstruction: Secondary | ICD-10-CM

## 2017-11-17 DIAGNOSIS — R7881 Bacteremia: Secondary | ICD-10-CM

## 2017-11-17 DIAGNOSIS — Z8501 Personal history of malignant neoplasm of esophagus: Secondary | ICD-10-CM

## 2017-11-17 DIAGNOSIS — B9689 Other specified bacterial agents as the cause of diseases classified elsewhere: Secondary | ICD-10-CM

## 2017-11-17 DIAGNOSIS — E119 Type 2 diabetes mellitus without complications: Secondary | ICD-10-CM

## 2017-11-17 DIAGNOSIS — F17290 Nicotine dependence, other tobacco product, uncomplicated: Secondary | ICD-10-CM

## 2017-11-17 DIAGNOSIS — Z833 Family history of diabetes mellitus: Secondary | ICD-10-CM

## 2017-11-17 DIAGNOSIS — J690 Pneumonitis due to inhalation of food and vomit: Secondary | ICD-10-CM

## 2017-11-17 LAB — URINE CULTURE: Culture: NO GROWTH

## 2017-11-17 LAB — MAGNESIUM: Magnesium: 2 mg/dL (ref 1.7–2.4)

## 2017-11-17 LAB — CBC WITH DIFFERENTIAL/PLATELET
Basophils Absolute: 0 10*3/uL (ref 0.0–0.1)
Basophils Relative: 0 %
EOS ABS: 0.1 10*3/uL (ref 0.0–0.7)
EOS PCT: 1 %
HEMATOCRIT: 28.4 % — AB (ref 39.0–52.0)
Hemoglobin: 9.7 g/dL — ABNORMAL LOW (ref 13.0–17.0)
LYMPHS ABS: 1.6 10*3/uL (ref 0.7–4.0)
Lymphocytes Relative: 6 %
MCH: 33.1 pg (ref 26.0–34.0)
MCHC: 34.2 g/dL (ref 30.0–36.0)
MCV: 96.9 fL (ref 78.0–100.0)
MONO ABS: 1.8 10*3/uL (ref 0.1–1.0)
MONOS PCT: 7 %
Neutro Abs: 22.9 10*3/uL (ref 1.7–7.7)
Neutrophils Relative %: 86 %
PLATELETS: 234 10*3/uL (ref 150–400)
RBC: 2.93 MIL/uL — ABNORMAL LOW (ref 4.22–5.81)
RDW: 13.2 % (ref 11.5–15.5)
WBC: 26.4 10*3/uL — AB (ref 4.0–10.5)

## 2017-11-17 LAB — BASIC METABOLIC PANEL
Anion gap: 6 (ref 5–15)
BUN: 19 mg/dL (ref 8–23)
CALCIUM: 7.9 mg/dL — AB (ref 8.9–10.3)
CO2: 29 mmol/L (ref 22–32)
Chloride: 106 mmol/L (ref 98–111)
Creatinine, Ser: 1 mg/dL (ref 0.61–1.24)
GFR calc Af Amer: >60 mL/min (ref >60)
GLUCOSE: 248 mg/dL — AB (ref 70–99)
Potassium: 3.2 mmol/L — ABNORMAL LOW (ref 3.5–5.1)
Sodium: 141 mmol/L (ref 135–145)

## 2017-11-17 LAB — GLUCOSE, CAPILLARY
GLUCOSE-CAPILLARY: 231 mg/dL — AB (ref 70–99)
GLUCOSE-CAPILLARY: 237 mg/dL — AB (ref 70–99)
GLUCOSE-CAPILLARY: 253 mg/dL — AB (ref 70–99)
GLUCOSE-CAPILLARY: 297 mg/dL — AB (ref 70–99)
Glucose-Capillary: 257 mg/dL — ABNORMAL HIGH (ref 70–99)

## 2017-11-17 LAB — PHOSPHORUS: Phosphorus: 2.4 mg/dL — ABNORMAL LOW (ref 2.5–4.6)

## 2017-11-17 LAB — LEGIONELLA PNEUMOPHILA SEROGP 1 UR AG: L. PNEUMOPHILA SEROGP 1 UR AG: NEGATIVE

## 2017-11-17 LAB — ECHOCARDIOGRAM COMPLETE
Height: 74 in
WEIGHTICAEL: 2955.93 [oz_av]

## 2017-11-17 LAB — PROCALCITONIN: Procalcitonin: 43.56 ng/mL

## 2017-11-17 MED ORDER — POTASSIUM CHLORIDE 10 MEQ/100ML IV SOLN: 10.0000 meq | INTRAVENOUS | Status: DC

## 2017-11-17 MED ORDER — VANCOMYCIN HCL IN DEXTROSE 750-5 MG/150ML-% IV SOLN: 750.0000 mg | Freq: Two times a day (BID) | INTRAVENOUS | Status: DC

## 2017-11-17 MED ORDER — VANCOMYCIN HCL 10 G IV SOLR
1500.0000 mg | Freq: Once | INTRAVENOUS | Status: DC
  Filled 2017-11-17: qty 1500

## 2017-11-17 MED ORDER — POTASSIUM PHOSPHATES 15 MMOLE/5ML IV SOLN
10.0000 mmol | Freq: Once | INTRAVENOUS | Status: AC
  Administered 2017-11-17: 10 mmol via INTRAVENOUS
  Filled 2017-11-17: qty 3.33

## 2017-11-17 MED ORDER — POTASSIUM CHLORIDE 10 MEQ/50ML IV SOLN
INTRAVENOUS | Status: AC
  Filled 2017-11-17: qty 50

## 2017-11-17 MED ORDER — INSULIN GLARGINE 100 UNIT/ML ~~LOC~~ SOLN
10.0000 [IU] | Freq: Every day | SUBCUTANEOUS | Status: DC
  Administered 2017-11-17: 10 [IU] via SUBCUTANEOUS
  Filled 2017-11-17 (×2): qty 0.1

## 2017-11-17 MED ORDER — POTASSIUM CHLORIDE 10 MEQ/50ML IV SOLN
10.0000 meq | INTRAVENOUS | Status: AC
  Administered 2017-11-17 (×3): 10 meq via INTRAVENOUS
  Filled 2017-11-17: qty 50

## 2017-11-17 MED ORDER — INSULIN ASPART 100 UNIT/ML ~~LOC~~ SOLN
0.0000 [IU] | Freq: Four times a day (QID) | SUBCUTANEOUS | Status: DC
  Administered 2017-11-17: 7 [IU] via SUBCUTANEOUS
  Administered 2017-11-17 – 2017-11-18 (×4): 11 [IU] via SUBCUTANEOUS
  Administered 2017-11-18 – 2017-11-19 (×3): 7 [IU] via SUBCUTANEOUS

## 2017-11-17 MED ORDER — IPRATROPIUM-ALBUTEROL 0.5-2.5 (3) MG/3ML IN SOLN
3.0000 mL | Freq: Two times a day (BID) | RESPIRATORY_TRACT | Status: DC
  Administered 2017-11-17 – 2017-11-19 (×5): 3 mL via RESPIRATORY_TRACT
  Filled 2017-11-17 (×5): qty 3

## 2017-11-17 MED ORDER — TRACE MINERALS CR-CU-MN-SE-ZN 10-1000-500-60 MCG/ML IV SOLN
INTRAVENOUS | Status: AC
  Administered 2017-11-17: 17:00:00 via INTRAVENOUS
  Filled 2017-11-17 (×2): qty 1680

## 2017-11-17 MED ORDER — FAT EMULSION PLANT BASED 20 % IV EMUL
240.0000 mL | INTRAVENOUS | Status: AC
  Administered 2017-11-17: 240 mL via INTRAVENOUS
  Filled 2017-11-17: qty 250

## 2017-11-17 MED ORDER — SODIUM CHLORIDE 0.9 % IV SOLN
1.0000 g | Freq: Three times a day (TID) | INTRAVENOUS | Status: DC
  Filled 2017-11-17: qty 1

## 2017-11-17 MED ORDER — SODIUM CHLORIDE 0.9 % IV SOLN
2.0000 g | INTRAVENOUS | Status: DC
  Administered 2017-11-17 – 2017-11-21 (×5): 2 g via INTRAVENOUS
  Filled 2017-11-17 (×3): qty 2
  Filled 2017-11-17: qty 20
  Filled 2017-11-17 (×2): qty 2

## 2017-11-17 NOTE — Consult Note (Addendum)
[facility] for Infectious Disease    Date of Admission:  11/06/2017   Total days of antibiotics: 3               Reason for Consult:  Aspiration PNA   Referring Provider: Horris Latino   Assessment: Aspitration PNA K oxytoca bacteremia SBO  Plan: 1. Continue ceftriaxone (stop vanco, merrem) 2. Await repeat BCx 3. Await sensi of Kleb bcx.  4. SBO appears better on f/u images.  5. Consider u/s of LUE 6. Will have ID f/u over w/e  Comment Multiple possible sources for Kleb- aspiration, SBO, PIC.  Will follow with you, remove PIC if further fevers.  He appears to be improving.  Would not escalte anbx.   Thank you so much for this interesting consult,  Principal Problem:   SBO (small bowel obstruction) (HCC) Active Problems:   HTN (hypertension)   OSA (obstructive sleep apnea)   Acute on chronic respiratory failure with hypoxia ([facility])   . Chlorhexidine Gluconate Cloth  6 each Topical Daily  . enoxaparin (LOVENOX) injection  40 mg Subcutaneous Daily  . insulin aspart  0-20 Units Subcutaneous Q6H  . insulin glargine  10 Units Subcutaneous QHS  . ipratropium-albuterol  3 mL Nebulization BID  . mouth rinse  15 mL Mouth Rinse BID  . metoprolol tartrate  10 mg Intravenous Q6H    HPI: STANISLAUS KALTENBACH is a 84 y.o. male with hx of esophagela CA, DM2, recurrent SBO, adm [facility] with 24h of N/V, abd distension. He was found to have SBO. He refused NGT and refused surgery (as he had done in the past).  He did consent to have NGT placed in radiology on 7-7. He appeared to be improving on f/u images of his abd.  By 7-10 he developed worsening resp distress after fecal emesis, developed fever, and was started on vanco/cefepime --> vanco/zosyn. He was transferred to ICU. He has also been noted to have diarrhea.His repeat Abd films show SBO, his CXR shows increased RUL opacity.   His BCx are since + for Klebsiella oxytoca. His anbx have been narrowed to ceftriaxone.  He  has been afebrile for last 48h. WBC now increased to 26.4.  His repeat abd film on 7-12 shows no small bowel dilatation.  His repeat CXR today shows: Increased pulmonary infiltrates since 11/15/2017 consistent with multifocal pneumonia He has repeat BCx sent today.   Review of Systems: Review of Systems  Constitutional: Negative for chills and fever.  Respiratory: Positive for cough. Negative for shortness of breath.   he has foley He has flexiseal.  Please see HPI. All other systems reviewed and negative.   Past Medical History:  Diagnosis Date  . Acid reflux   . Barrett's esophagus   . BPH (benign prostatic hyperplasia)   . Colon polyps   . DDD (degenerative disc disease)   . Diabetes mellitus    controlled with diet and exercise  . ED (erectile dysfunction)   . Esophageal cancer ([place])    tx'd surgery - abdominal & right posterior chest approach (2001) - [place], [place]  . First degree AV block   . Gout   . Hemorrhoids   . History of palpitations   . Hyperlipidemia   . Hypertension   . Lipoma   . Obesity   . Osteoarthritis   . Tinea versicolor   . Tinnitus     Social History   Tobacco Use  .  Smoking status: Current Some Day Smoker    Years: 50.00    Types: Cigars, Pipe  . Smokeless tobacco: Never Used  . Tobacco comment: 2-3x/daily, sometimes not at all , tobacco info given 08/01/13  Substance Use Topics  . Alcohol use: Yes    Alcohol/week: 4.2 - 8.4 oz    Types: 7 - 14 Standard drinks or equivalent per week  . Drug use: No    Family History  Problem Relation Age of Onset  . Diabetes Mother   . CAD Mother   . Stroke Sister   . Colon cancer Neg Hx   . Esophageal cancer Neg Hx   . Rectal cancer Neg Hx   . Stomach cancer Neg Hx      Medications:  Scheduled: . Chlorhexidine Gluconate Cloth  6 each Topical Daily  . enoxaparin (LOVENOX) injection  40 mg Subcutaneous Daily  . insulin aspart  0-20 Units Subcutaneous Q6H  . insulin glargine  10 Units  Subcutaneous QHS  . ipratropium-albuterol  3 mL Nebulization BID  . mouth rinse  15 mL Mouth Rinse BID  . metoprolol tartrate  10 mg Intravenous Q6H    Abtx:  Anti-infectives (From admission, onward)   Start     Dose/Rate Route Frequency Ordered Stop   11/18/17 0800  vancomycin (VANCOCIN) IVPB 750 mg/150 ml premix     750 mg 150 mL/hr over 60 Minutes Intravenous Every 12 hours 11/17/17 1642     11/17/17 1800  vancomycin (VANCOCIN) 1,500 mg in sodium chloride 0.9 % 500 mL IVPB     1,500 mg 250 mL/hr over 120 Minutes Intravenous  Once 11/17/17 1642     11/17/17 1800  meropenem (MERREM) 1 g in sodium chloride 0.9 % 100 mL IVPB     1 g 200 mL/hr over 30 Minutes Intravenous Every 8 hours 11/17/17 1647     11/16/17 2200  cefTRIAXone (ROCEPHIN) 2 g in sodium chloride 0.9 % 100 mL IVPB  Status:  Discontinued     2 g 200 mL/hr over 30 Minutes Intravenous Every 24 hours 11/16/17 1447 11/17/17 1609   11/16/17 2100  vancomycin (VANCOCIN) 1,500 mg in sodium chloride 0.9 % 500 mL IVPB  Status:  Discontinued     1,500 mg 250 mL/hr over 120 Minutes Intravenous Every 24 hours 11/15/17 1951 11/16/17 1131   11/15/17 2200  piperacillin-tazobactam (ZOSYN) IVPB 3.375 g  Status:  Discontinued     3.375 g 12.5 mL/hr over 240 Minutes Intravenous Every 8 hours 11/15/17 2137 11/16/17 1447   11/15/17 2030  vancomycin (VANCOCIN) 1,750 mg in sodium chloride 0.9 % 500 mL IVPB     1,750 mg 250 mL/hr over 120 Minutes Intravenous  Once 11/15/17 1948 11/15/17 2330   11/15/17 2000  ceFEPIme (MAXIPIME) 1 g in sodium chloride 0.9 % 100 mL IVPB  Status:  Discontinued     1 g 200 mL/hr over 30 Minutes Intravenous Every 8 hours 11/15/17 1932 11/15/17 2132   11/08/17 1200  fluconazole (DIFLUCAN) IVPB 100 mg  Status:  Discontinued     100 mg 50 mL/hr over 60 Minutes Intravenous Every 24 hours 11/08/17 1032 11/10/17 0934        OBJECTIVE: Blood pressure (!) 149/55, pulse 87, temperature 98.6 F (37 C), temperature  source Oral, resp. rate (!) 24, height _0  (1.88 m), weight 83.8 kg (184 lb 11.9 oz), SpO2 (!) 86 %.  Physical Exam  Constitutional: He appears well-developed and well-nourished.  Non-toxic appearance. He does  not appear ill.  HENT:  Head: Normocephalic and atraumatic.  Mouth/Throat: Oropharynx is clear and moist.  Eyes: Pupils are equal, round, and reactive to light. EOM are normal.  Cardiovascular: Normal rate, regular rhythm and normal heart sounds.  Pulmonary/Chest: Effort normal. He has decreased breath sounds.  Abdominal: Soft. Bowel sounds are normal. He exhibits no distension. There is no tenderness.  Musculoskeletal: He exhibits no edema.  Neurological: No sensory deficit.  RUE PIC is clean, non-tender, non erythema LUE is swollen, erythematous.   Lab Results Results for orders placed or performed during the hospital encounter of 11/06/17 (from the past 48 hour(s))  Glucose, capillary     Status: Abnormal   Collection Time: 11/15/17  5:55 PM  Result Value Ref Range   Glucose-Capillary 196 (H) 70 - 99 mg/dL  Glucose, capillary     Status: Abnormal   Collection Time: 11/15/17  7:27 PM  Result Value Ref Range   Glucose-Capillary 180 (H) 70 - 99 mg/dL  Blood gas, arterial     Status: Abnormal   Collection Time: 11/15/17  7:30 PM  Result Value Ref Range   FIO2 100.00    O2 Content 15.0 L/min   Delivery systems NON-REBREATHER OXYGEN MASK    pH, Arterial 7.465 (H) 7.350 - 7.450   pCO2 arterial 28.7 (L) 32.0 - 48.0 mmHg   pO2, Arterial 70.7 (L) 83.0 - 108.0 mmHg   Bicarbonate 20.1 20.0 - 28.0 mmol/L   Acid-base deficit 2.0 0.0 - 2.0 mmol/L   O2 Saturation 93.2 %   Patient temperature 101.4    Collection site LEFT RADIAL    Drawn by [id number]    Sample type ARTERIAL DRAW    Allens test (pass/fail) PASS PASS    Comment: Performed at [facility], [place] [address], [place], [place] [address]  MRSA PCR Screening     Status: None   Collection Time: 11/15/17   7:54 PM  Result Value Ref Range   MRSA by PCR NEGATIVE NEGATIVE    Comment:        The GeneXpert MRSA Assay (FDA approved for NASAL specimens only), is one component of a comprehensive MRSA colonization surveillance program. It is not intended to diagnose MRSA infection nor to guide or monitor treatment for MRSA infections. Performed at [facility], [place] [address] [place], [place] [address]   Culture, blood (routine x 2) Call MD if unable to obtain prior to antibiotics being given     Status: Abnormal (Preliminary result)   Collection Time: 11/15/17  9:01 PM  Result Value Ref Range   Specimen Description      BLOOD LEFT ANTECUBITAL Performed at [facility] [address], [place], [place] [address]    Special Requests      BOTTLES DRAWN AEROBIC AND ANAEROBIC Blood Culture adequate volume Performed at [facility] [address], [place], [place] [address]    Culture  Setup Time      GRAM NEGATIVE RODS IN BOTH AEROBIC AND ANAEROBIC BOTTLES CRITICAL RESULT CALLED TO, READ BACK BY AND VERIFIED WITH: [place] Slider PharmD 14:15 11/16/17 ([email or web address]) Performed at [facility], [place] [address] [place], [place] [address]    Culture KLEBSIELLA OXYTOCA (A)    Report Status PENDING   Blood Culture ID Panel (Reflexed)     Status: Abnormal   Collection Time: 11/15/17  9:01 PM  Result Value Ref Range   Enterococcus species NOT DETECTED NOT DETECTED  Listeria monocytogenes NOT DETECTED NOT DETECTED   Staphylococcus species NOT DETECTED NOT DETECTED   Staphylococcus aureus NOT DETECTED NOT DETECTED   Streptococcus species NOT DETECTED NOT DETECTED   Streptococcus agalactiae NOT DETECTED NOT DETECTED   Streptococcus pneumoniae NOT DETECTED NOT DETECTED   Streptococcus pyogenes NOT DETECTED NOT DETECTED   Acinetobacter baumannii NOT DETECTED NOT DETECTED   Enterobacteriaceae species DETECTED (A) NOT DETECTED    Comment:  Enterobacteriaceae represent a large family of gram-negative bacteria, not a single organism. CRITICAL RESULT CALLED TO, READ BACK BY AND VERIFIED WITH: [place] Slider PharmD 14:15 11/16/17 ([email or web address])    Enterobacter cloacae complex NOT DETECTED NOT DETECTED   Escherichia coli NOT DETECTED NOT DETECTED   Klebsiella oxytoca DETECTED (A) NOT DETECTED    Comment: CRITICAL RESULT CALLED TO, READ BACK BY AND VERIFIED WITH: [place] Slider PharmD 14:15 11/16/17 ([email or web address])    Klebsiella pneumoniae NOT DETECTED NOT DETECTED   Proteus species NOT DETECTED NOT DETECTED   Serratia marcescens NOT DETECTED NOT DETECTED   Carbapenem resistance NOT DETECTED NOT DETECTED   Haemophilus influenzae NOT DETECTED NOT DETECTED   Neisseria meningitidis NOT DETECTED NOT DETECTED   Pseudomonas aeruginosa NOT DETECTED NOT DETECTED   Candida albicans NOT DETECTED NOT DETECTED   Candida glabrata NOT DETECTED NOT DETECTED   Candida krusei NOT DETECTED NOT DETECTED   Candida parapsilosis NOT DETECTED NOT DETECTED   Candida tropicalis NOT DETECTED NOT DETECTED  Culture, blood (routine x 2) Call MD if unable to obtain prior to antibiotics being given     Status: None (Preliminary result)   Collection Time: 11/15/17  9:12 PM  Result Value Ref Range   Specimen Description      BLOOD LEFT HAND Performed at [facility], [place] [address] [place] [address], [place], [place] [address]    Special Requests      BOTTLES DRAWN AEROBIC AND ANAEROBIC Blood Culture adequate volume Performed at [facility] [address], [place], [place] [address]    Culture  Setup Time      GRAM NEGATIVE RODS IN BOTH AEROBIC AND ANAEROBIC BOTTLES CRITICAL VALUE NOTED.  VALUE IS CONSISTENT WITH PREVIOUSLY REPORTED AND CALLED VALUE. Performed at [facility], [place] [address] [place],  [address]    Culture GRAM NEGATIVE RODS    Report Status PENDING   HIV antibody (Routine Screening)     Status: None   Collection  Time: 11/15/17  9:21 PM  Result Value Ref Range   HIV Screen 4th Generation wRfx Non Reactive Non Reactive    Comment: (NOTE) Performed At: [facility] [place], [place] [id number] Rush Farmer MD [id number]   CBC with Differential/Platelet     Status: Abnormal   Collection Time: 11/15/17  9:21 PM  Result Value Ref Range   WBC 16.5 (H) 4.0 - 10.5 K/uL   RBC 3.19 (L) 4.22 - 5.81 MIL/uL   Hemoglobin 10.4 (L) 13.0 - 17.0 g/dL   HCT 30.9 (L) 39.0 - 52.0 %   MCV 96.9 78.0 - 100.0 fL   MCH 32.6 26.0 - 34.0 pg   MCHC 33.7 30.0 - 36.0 g/dL   RDW 12.8 11.5 - 15.5 %   Platelets 241 150 - 400 K/uL   Neutrophils Relative % 89 %   Lymphocytes Relative 4 %   Monocytes Relative 7 %   Eosinophils Relative 0 %   Basophils Relative 0 %   Neutro Abs 14.6 (H) 1.7 - 7.7 K/uL  Lymphs Abs 0.7 0.7 - 4.0 K/uL   Monocytes Absolute 1.2 (H) 0.1 - 1.0 K/uL   Eosinophils Absolute 0.0 0.0 - 0.7 K/uL   Basophils Absolute 0.0 0.0 - 0.1 K/uL   WBC Morphology INCREASED BANDS (>20% BANDS)     Comment: Performed at [facility], [place] [address], [place], [place] [address]  Comprehensive metabolic panel     Status: Abnormal   Collection Time: 11/15/17  9:21 PM  Result Value Ref Range   Sodium 142 135 - 145 mmol/L   Potassium 3.5 3.5 - 5.1 mmol/L   Chloride 110 98 - 111 mmol/L    Comment: Please note change in reference range.   CO2 23 22 - 32 mmol/L   Glucose, Bld 188 (H) 70 - 99 mg/dL    Comment: Please note change in reference range.   BUN 20 8 - 23 mg/dL    Comment: Please note change in reference range.   Creatinine, Ser 1.35 (H) 0.61 - 1.24 mg/dL   Calcium 8.1 (L) 8.9 - 10.3 mg/dL   Total Protein 5.3 (L) 6.5 - 8.1 g/dL   Albumin 2.5 (L) 3.5 - 5.0 g/dL   AST 44 (H) 15 - 41 U/L   ALT 29 0 - 44 U/L    Comment: Please note change in reference range.   Alkaline Phosphatase 70 38 - 126 U/L   Total Bilirubin 2.8 (H) 0.3 - 1.2 mg/dL   GFR calc non Af Amer 47  (L) >60 mL/min   GFR calc Af Amer 55 (L) >60 mL/min    Comment: (NOTE) The eGFR has been calculated using the CKD EPI equation. This calculation has not been validated in all clinical situations. eGFR's persistently <60 mL/min signify possible Chronic Kidney Disease.    Anion gap 9 5 - 15    Comment: Performed at [facility], [place] [address] [place], [place] [address]  Brain natriuretic peptide     Status: Abnormal   Collection Time: 11/15/17  9:21 PM  Result Value Ref Range   B Natriuretic Peptide 566.1 (H) 0.0 - 100.0 pg/mL    Comment: Performed at [facility], [place] [address], [place], [place] [address]  Magnesium     Status: None   Collection Time: 11/15/17  9:21 PM  Result Value Ref Range   Magnesium 2.0 1.7 - 2.4 mg/dL    Comment: Performed at [facility], [place] [address] [place], [place] [address]  Strep pneumoniae urinary antigen     Status: None   Collection Time: 11/15/17  9:40 PM  Result Value Ref Range   Strep Pneumo Urinary Antigen NEGATIVE NEGATIVE    Comment:        Infection due to S. pneumoniae cannot be absolutely ruled out since the antigen present may be below the detection limit of the test. PERFORMED AT [facility] Performed at [facility], [place] [address] [place] [address], [place],  [address]   Legionella Pneumophila Serogp 1 Ur Ag     Status: None   Collection Time: 11/15/17  9:40 PM  Result Value Ref Range   L. pneumophila Serogp 1 Ur Ag Negative Negative    Comment: (NOTE) Presumptive negative for L. pneumophila serogroup 1 antigen in urine, suggesting no recent or current infection. Legionnaires' disease cannot be ruled out since other serogroups and species may also cause disease. Performed At: [facility] [place], [place] [id number] Rush Farmer MD [id number]  Source of Sample URINE, RANDOM     Comment: Performed at [facility],  [place] [address], [place], [place] [address]  Urinalysis, Routine w reflex microscopic     Status: Abnormal   Collection Time: 11/15/17  9:40 PM  Result Value Ref Range   Color, Urine AMBER (A) YELLOW    Comment: BIOCHEMICALS MAY BE AFFECTED BY COLOR   APPearance HAZY (A) CLEAR   Specific Gravity, Urine 1.011 1.005 - 1.030   pH 5.0 5.0 - 8.0   Glucose, UA NEGATIVE NEGATIVE mg/dL   Hgb urine dipstick LARGE (A) NEGATIVE   Bilirubin Urine NEGATIVE NEGATIVE   Ketones, ur NEGATIVE NEGATIVE mg/dL   Protein, ur NEGATIVE NEGATIVE mg/dL   Nitrite NEGATIVE NEGATIVE   Leukocytes, UA NEGATIVE NEGATIVE   RBC / HPF >50 (H) 0 - 5 RBC/hpf   WBC, UA 0-5 0 - 5 WBC/hpf   Bacteria, UA RARE (A) NONE SEEN   Squamous Epithelial / LPF 0-5 0 - 5   Hyaline Casts, UA PRESENT     Comment: Performed at [facility], [place] [address] [place], [place] [address]  Culture, Urine     Status: None   Collection Time: 11/15/17  9:40 PM  Result Value Ref Range   Specimen Description      URINE, RANDOM Performed at [facility], [place] [address], [place], [place] [address]    Special Requests      NONE Performed at [facility], [place] [address] [place], [place] [address]    Culture      NO GROWTH Performed at [facility], [place] [address] [place],  [address]    Report Status 11/17/2017 FINAL   Glucose, capillary     Status: Abnormal   Collection Time: 11/16/17 12:08 AM  Result Value Ref Range   Glucose-Capillary 174 (H) 70 - 99 mg/dL   Comment 1 Notify RN    Comment 2 Document in Chart   Comprehensive metabolic panel     Status: Abnormal   Collection Time: 11/16/17  5:16 AM  Result Value Ref Range   Sodium 140 135 - 145 mmol/L   Potassium 4.0 3.5 - 5.1 mmol/L   Chloride 108 98 - 111 mmol/L    Comment: Please note change in reference range.   CO2 23 22 - 32 mmol/L   Glucose, Bld 189 (H) 70 - 99 mg/dL    Comment: Please note change in  reference range.   BUN 22 8 - 23 mg/dL    Comment: Please note change in reference range.   Creatinine, Ser 1.31 (H) 0.61 - 1.24 mg/dL   Calcium 7.9 (L) 8.9 - 10.3 mg/dL   Total Protein 5.5 (L) 6.5 - 8.1 g/dL   Albumin 2.6 (L) 3.5 - 5.0 g/dL   AST 36 15 - 41 U/L   ALT 27 0 - 44 U/L    Comment: Please note change in reference range.   Alkaline Phosphatase 57 38 - 126 U/L   Total Bilirubin 2.9 (H) 0.3 - 1.2 mg/dL   GFR calc non Af Amer 49 (L) >60 mL/min   GFR calc Af Amer 57 (L) >60 mL/min    Comment: (NOTE) The eGFR has been calculated using the CKD EPI equation. This calculation has not been validated in all clinical situations. eGFR's persistently <60 mL/min signify possible Chronic Kidney Disease.    Anion gap 9 5 - 15    Comment: Performed at  [facility], [place] [address], [place], [place] [address]  Magnesium     Status: None   Collection Time: 11/16/17  5:16 AM  Result Value Ref Range   Magnesium 1.9 1.7 - 2.4 mg/dL    Comment: Performed at [facility], [place] [address] [place], [place] [address]  Phosphorus     Status: None   Collection Time: 11/16/17  5:16 AM  Result Value Ref Range   Phosphorus 3.5 2.5 - 4.6 mg/dL    Comment: Performed at [facility], [place] [address] [place], [place] [address]  Brain natriuretic peptide     Status: Abnormal   Collection Time: 11/16/17  5:16 AM  Result Value Ref Range   B Natriuretic Peptide 863.0 (H) 0.0 - 100.0 pg/mL    Comment: Performed at [facility], [place] [address], [place],  [address]  Glucose, capillary     Status: Abnormal   Collection Time: 11/16/17  5:40 AM  Result Value Ref Range   Glucose-Capillary 169 (H) 70 - 99 mg/dL  Glucose, capillary     Status: Abnormal   Collection Time: 11/16/17 11:54 AM  Result Value Ref Range   Glucose-Capillary 203 (H) 70 - 99 mg/dL   Comment 1 Notify RN    Comment 2 Document in Chart   Glucose, capillary      Status: Abnormal   Collection Time: 11/16/17  5:45 PM  Result Value Ref Range   Glucose-Capillary 188 (H) 70 - 99 mg/dL   Comment 1 Notify RN    Comment 2 Document in Chart   Procalcitonin - Baseline     Status: None   Collection Time: 11/16/17  7:35 PM  Result Value Ref Range   Procalcitonin 52.49 ng/mL    Comment:        Interpretation: PCT >= 10 ng/mL: Important systemic inflammatory response, almost exclusively due to severe bacterial sepsis or septic shock. (NOTE)       Sepsis PCT Algorithm           Lower Respiratory Tract                                      Infection PCT Algorithm    ----------------------------     ----------------------------         PCT < 0.25 ng/mL                PCT < 0.10 ng/mL         Strongly encourage             Strongly discourage   discontinuation of antibiotics    initiation of antibiotics    ----------------------------     -----------------------------       PCT 0.25 - 0.50 ng/mL            PCT 0.10 - 0.25 ng/mL               OR       >80% decrease in PCT            Discourage initiation of                                            antibiotics      Encourage discontinuation  of antibiotics    ----------------------------     -----------------------------         PCT >= 0.50 ng/mL              PCT 0.26 - 0.50 ng/mL                AND       <80% decrease in PCT             Encourage initiation of                                             antibiotics       Encourage continuation           of antibiotics    ----------------------------     -----------------------------        PCT >= 0.50 ng/mL                  PCT > 0.50 ng/mL               AND         increase in PCT                  Strongly encourage                                      initiation of antibiotics    Strongly encourage escalation           of antibiotics                                     -----------------------------                                            PCT <= 0.25 ng/mL                                                 OR                                        > 80% decrease in PCT                                     Discontinue / Do not initiate                                             antibiotics Performed at [facility] [address], [place],  [address]   Glucose, capillary     Status: Abnormal   Collection Time: 11/16/17 11:15 PM  Result Value Ref Range   Glucose-Capillary 254 (H) 70 - 99 mg/dL  CBC  with Differential/Platelet     Status: Abnormal   Collection Time: 11/17/17  5:19 AM  Result Value Ref Range   WBC 26.4 (H) 4.0 - 10.5 K/uL   RBC 2.93 (L) 4.22 - 5.81 MIL/uL   Hemoglobin 9.7 (L) 13.0 - 17.0 g/dL   HCT 28.4 (L) 39.0 - 52.0 %   MCV 96.9 78.0 - 100.0 fL   MCH 33.1 26.0 - 34.0 pg   MCHC 34.2 30.0 - 36.0 g/dL   RDW 13.2 11.5 - 15.5 %   Platelets 234 150 - 400 K/uL   Neutrophils Relative % 86 %   Neutro Abs 22.9 1.7 - 7.7 K/uL   Lymphocytes Relative 6 %   Lymphs Abs 1.6 0.7 - 4.0 K/uL   Monocytes Relative 7 %   Monocytes Absolute 1.8 0.1 - 1.0 K/uL   Eosinophils Relative 1 %   Eosinophils Absolute 0.1 0.0 - 0.7 K/uL   Basophils Relative 0 %   Basophils Absolute 0.0 0.0 - 0.1 K/uL   RBC Morphology POLYCHROMASIA PRESENT     Comment: Performed at [facility], [place] [address], [place], [place] [address]  Basic metabolic panel     Status: Abnormal   Collection Time: 11/17/17  5:19 AM  Result Value Ref Range   Sodium 141 135 - 145 mmol/L   Potassium 3.2 (L) 3.5 - 5.1 mmol/L    Comment: DELTA CHECK NOTED REPEATED TO VERIFY    Chloride 106 98 - 111 mmol/L    Comment: Please note change in reference range.   CO2 29 22 - 32 mmol/L   Glucose, Bld 248 (H) 70 - 99 mg/dL    Comment: Please note change in reference range.   BUN 19 8 - 23 mg/dL    Comment: Please note change in reference range.   Creatinine, Ser 1.00 0.61 - 1.24 mg/dL   Calcium 7.9 (L) 8.9 - 10.3  mg/dL   GFR calc non Af Amer >60 >60 mL/min   GFR calc Af Amer >60 >60 mL/min    Comment: (NOTE) The eGFR has been calculated using the CKD EPI equation. This calculation has not been validated in all clinical situations. eGFR's persistently <60 mL/min signify possible Chronic Kidney Disease.    Anion gap 6 5 - 15    Comment: Performed at [facility], [place] [address] [place], [place] [address]  Magnesium     Status: None   Collection Time: 11/17/17  5:19 AM  Result Value Ref Range   Magnesium 2.0 1.7 - 2.4 mg/dL    Comment: Performed at [facility], [place] [address] [place], [place] [address]  Phosphorus     Status: Abnormal   Collection Time: 11/17/17  5:19 AM  Result Value Ref Range   Phosphorus 2.4 (L) 2.5 - 4.6 mg/dL    Comment: Performed at [facility], [place] [address] [place] [address] [place], [place] [address]  Procalcitonin     Status: None   Collection Time: 11/17/17  5:19 AM  Result Value Ref Range   Procalcitonin 43.56 ng/mL    Comment:        Interpretation: PCT >= 10 ng/mL: Important systemic inflammatory response, almost exclusively due to severe bacterial sepsis or septic shock. (NOTE)       Sepsis PCT Algorithm           Lower Respiratory Tract  Infection PCT Algorithm    ----------------------------     ----------------------------         PCT < 0.25 ng/mL                PCT < 0.10 ng/mL         Strongly encourage             Strongly discourage   discontinuation of antibiotics    initiation of antibiotics    ----------------------------     -----------------------------       PCT 0.25 - 0.50 ng/mL            PCT 0.10 - 0.25 ng/mL               OR       >80% decrease in PCT            Discourage initiation of                                            antibiotics      Encourage discontinuation           of antibiotics    ----------------------------      -----------------------------         PCT >= 0.50 ng/mL              PCT 0.26 - 0.50 ng/mL                AND       <80% decrease in PCT             Encourage initiation of                                             antibiotics       Encourage continuation           of antibiotics    ----------------------------     -----------------------------        PCT >= 0.50 ng/mL                  PCT > 0.50 ng/mL               AND         increase in PCT                  Strongly encourage                                      initiation of antibiotics    Strongly encourage escalation           of antibiotics                                     -----------------------------                                           PCT <= 0.25 ng/mL  OR                                        > 80% decrease in PCT                                     Discontinue / Do not initiate                                             antibiotics Performed at [facility] [address] [place], [place] [address]   Glucose, capillary     Status: Abnormal   Collection Time: 11/17/17  6:33 AM  Result Value Ref Range   Glucose-Capillary 231 (H) 70 - 99 mg/dL  Glucose, capillary     Status: Abnormal   Collection Time: 11/17/17 12:27 PM  Result Value Ref Range   Glucose-Capillary 297 (H) 70 - 99 mg/dL  Glucose, capillary     Status: Abnormal   Collection Time: 11/17/17  5:15 PM  Result Value Ref Range   Glucose-Capillary 237 (H) 70 - 99 mg/dL   Comment 1 Notify RN    Comment 2 Document in Chart       Component Value Date/Time   SDES  11/15/2017 2140    URINE, RANDOM Performed at [facility], [place] [address], [place], [place] [address]    SPECREQUEST  11/15/2017 2140    NONE Performed at [facility], [place] [address], [place], [place] [address]    CULT  11/15/2017 2140    NO GROWTH Performed at [facility] [address], [place], [place] [address]    REPTSTATUS 11/17/2017 FINAL 11/15/2017 2140   Dg Chest Port 1 View  Result Date: 11/17/2017 CLINICAL DATA:  Pneumonia EXAM: PORTABLE CHEST 1 VIEW COMPARISON:  Portable exam 0418 hours compared to 11/15/2017 FINDINGS: Rotated to the LEFT. RIGHT arm PICC line tip projects over SVC. Enlargement of cardiac silhouette. Atherosclerotic calcification aorta. Patchy BILATERAL pulmonary infiltrates consistent with pneumonia, greatest in RIGHT upper lobe. RIGHT lung infiltrates have increased since previous exam particularly in the lower RIGHT lung. No pleural effusion or pneumothorax. Hiatal hernia. Osseous demineralization. IMPRESSION: Increased pulmonary infiltrates since 11/15/2017 consistent with multifocal pneumonia. Enlargement of cardiac silhouette. Electronically Signed   By: Lavonia Dana M.D.   On: 11/17/2017 08:33   Dg Chest Port 1 View  Result Date: 11/16/2017 CLINICAL DATA:  Cough. EXAM: PORTABLE CHEST 1 VIEW COMPARISON:  Radiograph of November 15, 2017. FINDINGS: Stable cardiomegaly. Atherosclerosis of thoracic aorta is noted. Right-sided PICC line is unchanged in position. No pneumothorax is noted. No significant pleural effusion is noted. Increased right upper lobe airspace opacity is noted concerning for pneumonia. Stable bibasilar subsegmental atelectasis. IMPRESSION: Increased right upper lobe airspace opacity is noted concerning for pneumonia. Aortic Atherosclerosis (ICD10-I70.0). Electronically Signed   By: Marijo Conception, M.D.   On: 11/16/2017 13:30   Dg Chest Port 1 View  Result Date: 11/15/2017 CLINICAL DATA:  Acute respiratory distress. Fever. Sudden episode of vomiting EXAM: PORTABLE CHEST 1 VIEW COMPARISON:  Three days ago FINDINGS: Airspace opacity in the right upper and left lower lobes. Cardiomegaly. Status post esophagectomy. High-density  material still seen within the pull-through, with barium study 2 days ago. PICC on the right  with tip at the SVC. IMPRESSION: 1. Bilateral airspace disease primarily concerning for aspiration in this clinical setting. 2. Esophagram 2 days ago with small volume high-density material still seen within the gastric pull-through. Electronically Signed   By: Monte Fantasia M.D.   On: 11/15/2017 20:45   Dg Abd Portable 1v  Result Date: 11/17/2017 CLINICAL DATA:  Abdominal pain and distention. EXAM: PORTABLE ABDOMEN - 1 VIEW COMPARISON:  Radiographs of November 16, 2017. FINDINGS: No small bowel dilatation is noted currently, which is significantly improved compared to prior exam. Residual contrast is noted within small bowel and colon. Phleboliths are noted in the pelvis. IMPRESSION: No bowel dilatation is noted currently. Electronically Signed   By: Marijo Conception, M.D.   On: 11/17/2017 08:57   Dg Abd Portable 1v  Result Date: 11/16/2017 CLINICAL DATA:  Small bowel obstruction. EXAM: PORTABLE ABDOMEN - 1 VIEW COMPARISON:  Radiographs of November 15, 2017. FINDINGS: Continued small bowel dilatation is noted. Residual contrast remains within small bowel loops as well as: Consistent with partial small bowel obstruction. Surgical clips are noted in the left upper quadrant. IMPRESSION: Findings consistent with partial small bowel obstruction. Electronically Signed   By: Marijo Conception, M.D.   On: 11/16/2017 13:29   Dg Abd Portable 1v  Result Date: 11/15/2017 CLINICAL DATA:  Acute respiratory distress EXAM: PORTABLE ABDOMEN - 1 VIEW COMPARISON:  Yesterday FINDINGS: Dilated small bowel with oral contrast seen diffusely. Under distended colon also has contrast. No concerning mass effect. IMPRESSION: Ongoing small bowel obstruction, partial based on colonic opacification. Electronically Signed   By: Monte Fantasia M.D.   On: 11/15/2017 22:13   Recent Results (from the past 240 hour(s))  MRSA PCR Screening     Status: None   Collection Time: 11/15/17  7:54 PM  Result Value Ref Range Status   MRSA by PCR  NEGATIVE NEGATIVE Final    Comment:        The GeneXpert MRSA Assay (FDA approved for NASAL specimens only), is one component of a comprehensive MRSA colonization surveillance program. It is not intended to diagnose MRSA infection nor to guide or monitor treatment for MRSA infections. Performed at [facility], [place] [address] [place], [place] [address]   Culture, blood (routine x 2) Call MD if unable to obtain prior to antibiotics being given     Status: Abnormal (Preliminary result)   Collection Time: 11/15/17  9:01 PM  Result Value Ref Range Status   Specimen Description   Final    BLOOD LEFT ANTECUBITAL Performed at [facility] [address] [place], [place] [address]    Special Requests   Final    BOTTLES DRAWN AEROBIC AND ANAEROBIC Blood Culture adequate volume Performed at [facility] [address], [place], [place] [address]    Culture  Setup Time   Final    GRAM NEGATIVE RODS IN BOTH AEROBIC AND ANAEROBIC BOTTLES CRITICAL RESULT CALLED TO, READ BACK BY AND VERIFIED WITH: Sheffield Slider PharmD 14:15 11/16/17 ([email or web address]) Performed at [facility], [place] [address], [place], [place] [address]    Culture KLEBSIELLA OXYTOCA (A)  Final   Report Status PENDING  Incomplete  Blood Culture ID Panel (Reflexed)     Status: Abnormal   Collection Time: 11/15/17  9:01 PM  Result Value Ref Range Status   Enterococcus species NOT  DETECTED NOT DETECTED Final   Listeria monocytogenes NOT DETECTED NOT DETECTED Final   Staphylococcus species NOT DETECTED NOT DETECTED Final   Staphylococcus aureus NOT DETECTED NOT DETECTED Final   Streptococcus species NOT DETECTED NOT DETECTED Final   Streptococcus agalactiae NOT DETECTED NOT DETECTED Final   Streptococcus pneumoniae NOT DETECTED NOT DETECTED Final   Streptococcus pyogenes NOT DETECTED NOT DETECTED Final   Acinetobacter baumannii NOT DETECTED NOT DETECTED Final    Enterobacteriaceae species DETECTED (A) NOT DETECTED Final    Comment: Enterobacteriaceae represent a large family of gram-negative bacteria, not a single organism. CRITICAL RESULT CALLED TO, READ BACK BY AND VERIFIED WITH: Sheffield Slider PharmD 14:15 11/16/17 ([email or web address])    Enterobacter cloacae complex NOT DETECTED NOT DETECTED Final   Escherichia coli NOT DETECTED NOT DETECTED Final   Klebsiella oxytoca DETECTED (A) NOT DETECTED Final    Comment: CRITICAL RESULT CALLED TO, READ BACK BY AND VERIFIED WITH: Sheffield Slider PharmD 14:15 11/16/17 ([email or web address])    Klebsiella pneumoniae NOT DETECTED NOT DETECTED Final   Proteus species NOT DETECTED NOT DETECTED Final   Serratia marcescens NOT DETECTED NOT DETECTED Final   Carbapenem resistance NOT DETECTED NOT DETECTED Final   Haemophilus influenzae NOT DETECTED NOT DETECTED Final   Neisseria meningitidis NOT DETECTED NOT DETECTED Final   Pseudomonas aeruginosa NOT DETECTED NOT DETECTED Final   Candida albicans NOT DETECTED NOT DETECTED Final   Candida glabrata NOT DETECTED NOT DETECTED Final   Candida krusei NOT DETECTED NOT DETECTED Final   Candida parapsilosis NOT DETECTED NOT DETECTED Final   Candida tropicalis NOT DETECTED NOT DETECTED Final  Culture, blood (routine x 2) Call MD if unable to obtain prior to antibiotics being given     Status: None (Preliminary result)   Collection Time: 11/15/17  9:12 PM  Result Value Ref Range Status   Specimen Description   Final    BLOOD LEFT HAND Performed at [facility], [place] [address] [place], [place] [address]    Special Requests   Final    BOTTLES DRAWN AEROBIC AND ANAEROBIC Blood Culture adequate volume Performed at [facility] [address], [place], [place] [address]    Culture  Setup Time   Final    GRAM NEGATIVE RODS IN BOTH AEROBIC AND ANAEROBIC BOTTLES CRITICAL VALUE NOTED.  VALUE IS CONSISTENT WITH PREVIOUSLY REPORTED AND CALLED VALUE. Performed at  [facility], [place] [address] [place], [place] [address]    Culture GRAM NEGATIVE RODS  Final   Report Status PENDING  Incomplete  Culture, Urine     Status: None   Collection Time: 11/15/17  9:40 PM  Result Value Ref Range Status   Specimen Description   Final    URINE, RANDOM Performed at [facility], [place] [address], [place], [place] [address]    Special Requests   Final    NONE Performed at [facility], [place] [address], [place], [place] [address]    Culture   Final    NO GROWTH Performed at [facility], [place] [address], [place], [place] [address]    Report Status 11/17/2017 FINAL  Final    Microbiology: Recent Results (from the past 240 hour(s))  MRSA PCR Screening     Status: None   Collection Time: 11/15/17  7:54 PM  Result Value Ref Range Status   MRSA by PCR NEGATIVE NEGATIVE Final    Comment:  The GeneXpert MRSA Assay (FDA approved for NASAL specimens only), is one component of a comprehensive MRSA colonization surveillance program. It is not intended to diagnose MRSA infection nor to guide or monitor treatment for MRSA infections. Performed at [facility], [place] [address], [place], [place] [address]   Culture, blood (routine x 2) Call MD if unable to obtain prior to antibiotics being given     Status: Abnormal (Preliminary result)   Collection Time: 11/15/17  9:01 PM  Result Value Ref Range Status   Specimen Description   Final    BLOOD LEFT ANTECUBITAL Performed at [place] [address], [place], [place] [address]    Special Requests   Final    BOTTLES DRAWN AEROBIC AND ANAEROBIC Blood Culture adequate volume Performed at [facility] [address] [employer] [employer], [place] [address]    Culture  Setup Time   Final    GRAM NEGATIVE RODS IN BOTH AEROBIC AND ANAEROBIC BOTTLES CRITICAL RESULT CALLED TO, READ BACK BY AND VERIFIED WITH: Sheffield Slider PharmD 14:15 11/16/17 ([email or web address]) Performed at [facility], [place] [address], [place], [place] [address]    Culture KLEBSIELLA OXYTOCA (A)  Final   Report Status PENDING  Incomplete  Blood Culture ID Panel (Reflexed)     Status: Abnormal   Collection Time: 11/15/17  9:01 PM  Result Value Ref Range Status   Enterococcus species NOT DETECTED NOT DETECTED Final   Listeria monocytogenes NOT DETECTED NOT DETECTED Final   Staphylococcus species NOT DETECTED NOT DETECTED Final   Staphylococcus aureus NOT DETECTED NOT DETECTED Final   Streptococcus species NOT DETECTED NOT DETECTED Final   Streptococcus agalactiae NOT DETECTED NOT DETECTED Final   Streptococcus pneumoniae NOT DETECTED NOT DETECTED Final   Streptococcus pyogenes NOT DETECTED NOT DETECTED Final   Acinetobacter baumannii NOT DETECTED NOT DETECTED Final   Enterobacteriaceae species DETECTED (A) NOT DETECTED Final    Comment: Enterobacteriaceae represent a large family of gram-negative bacteria, not a single organism. CRITICAL RESULT CALLED TO, READ BACK BY AND VERIFIED WITH: Sheffield Slider PharmD 14:15 11/16/17 ([email or web address])    Enterobacter cloacae complex NOT DETECTED NOT DETECTED Final   Escherichia coli NOT DETECTED NOT DETECTED Final   Klebsiella oxytoca DETECTED (A) NOT DETECTED Final    Comment: CRITICAL RESULT CALLED TO, READ BACK BY AND VERIFIED WITH: Sheffield Slider PharmD 14:15 11/16/17 ([email or web address])    Klebsiella pneumoniae NOT DETECTED NOT DETECTED Final   Proteus species NOT DETECTED NOT DETECTED Final   Serratia marcescens NOT DETECTED NOT DETECTED Final   Carbapenem resistance NOT DETECTED NOT DETECTED Final   Haemophilus influenzae NOT DETECTED NOT DETECTED Final   Neisseria meningitidis NOT DETECTED NOT DETECTED Final   Pseudomonas aeruginosa NOT DETECTED NOT DETECTED Final   Candida albicans NOT DETECTED NOT DETECTED Final   Candida glabrata NOT DETECTED NOT DETECTED Final   Candida krusei NOT DETECTED NOT  DETECTED Final   Candida parapsilosis NOT DETECTED NOT DETECTED Final   Candida tropicalis NOT DETECTED NOT DETECTED Final  Culture, blood (routine x 2) Call MD if unable to obtain prior to antibiotics being given     Status: None (Preliminary result)   Collection Time: 11/15/17  9:12 PM  Result Value Ref Range Status   Specimen Description   Final    BLOOD LEFT HAND Performed at [facility], [place] [address] [place], [place] [address]    Special Requests   Final  BOTTLES DRAWN AEROBIC AND ANAEROBIC Blood Culture adequate volume Performed at [facility] [address] [place], [place] [address]    Culture  Setup Time   Final    GRAM NEGATIVE RODS IN BOTH AEROBIC AND ANAEROBIC BOTTLES CRITICAL VALUE NOTED.  VALUE IS CONSISTENT WITH PREVIOUSLY REPORTED AND CALLED VALUE. Performed at [facility], [place] [address] [place], [place] [address]    Culture GRAM NEGATIVE RODS  Final   Report Status PENDING  Incomplete  Culture, Urine     Status: None   Collection Time: 11/15/17  9:40 PM  Result Value Ref Range Status   Specimen Description   Final    URINE, RANDOM Performed at [facility], [place] [address] [place], [place] [address]    Special Requests   Final    NONE Performed at [facility], [place] [address] [place], [place] [address]    Culture   Final    NO GROWTH Performed at [facility], [place] [address], [place], [place] [address]    Report Status 11/17/2017 FINAL  Final    Radiographs and labs were personally reviewed by me.   Bobby Rumpf, MD [facility] [phone] 11/17/2017, 5:40 PM

## 2017-11-17 NOTE — Progress Notes (Signed)
Oscar [name]:[id number] DOB: Nov 01, 1932 DOA: 11/06/2017 PCP: Deland Pretty, MD  HPI/Recap of past 80 hours: 84 year old male with past medical history significant for hypertension, esophageal cancer status post esophagectomy in remission, presents to the ER complaining of abdominal pain, nausea, vomiting.  Patient was noted to have SBO.  Patient noted to have refused surgical intervention earlier during the admission, but now is agreeable.  Surgery is on board and currently managing conservatively.  Today, pt currently on RA saturating in the low 90s. Was noted to be confused intermittently overnight. Pt very eager to eat/drink, explained about possible aspiration with feeding/drinking. Rectal tube placed, pt still refusing NG tube. Denies any new complaints, wants to have some ice-chips.  Assessment/Plan: Principal Problem:   SBO (small bowel obstruction) (HCC) Active Problems:   HTN (hypertension)   OSA (obstructive sleep apnea)   Acute on chronic respiratory failure with hypoxia (HCC)  Sepsis with Klebsiella bacteremia Likely source due to aspiration pna due to SBO Currently afebrile with worsening leukocytosis BC growing klebsiella oxytoca, repeat pending UA positive for RBC, UC with no growth Procalcitonin 52.49-->43.56 Repeat CXR with worsening multifocal pneumonia D/C Zosyn, ceftriaxone Start IV Meropenem, re-start Vancomycin  Spoke to ID Dr Johnnye Sima, no need to remove PICC line and can continue TPN, continue present management Monitor closely in ICU  Acute hypoxic respiratory distress Likely 2/2 aspiration pna due to SBO Currently on RA, saturating in the low 90s, no increased WOB Repeat CXR showed worsening opacity Urine strep pneumo negative, legionella pending Start IV Meropenem, re-start Vancomycin Supplemental O2, incentive spirometry, duonebs  AKI Improving Likely due to sepsis/bacteremia Due to acute respiratory distress, will hold off  IVF Gave 2 doses of lasix with adequate diuresis, will discontinue for now and give prn Daily BMP May need renal USS, bladder scan if worsens  Small bowel obstruction Serial abdominal x-ray revealed persistent SBO, unable to tolerate NG tube General surgery on board, managing conservatively for now, continue TPN Pharmacy to dose TPN, replace electrolytes accordingly NPO  Electrolyte abnormalities Hypokalemia, hypomagnesemia Replace as needed  Hypertension Continue IV hydralazine, Lopressor  OSA Continue CPAP  Hyperglycemia Likely due to TPN SSI, started lantus 10U   Code Status: Full  Family Communication: Friend at bedside  Disposition Plan: Once more stable   Consultants:  General surgery  Spoke to ID on 11/17/17  Procedures:  None  Antimicrobials:  IV Meropenem  IV Vancomycin  DVT prophylaxis: Enoxaparin   Objective: Vitals:   11/17/17 0831 11/17/17 1200 11/17/17 1225 11/17/17 1600  BP: (!) 159/79  138/63 (!) 149/55  Pulse:      Resp: (!) 21  17 (!) 24  Temp:  98.3 F (36.8 C)    TempSrc:  Oral    SpO2: 93%  96% (!) 86%  Weight:      Height:        Intake/Output Summary (Last 24 hours) at 11/17/2017 1612 Last data filed at 11/17/2017 1530 Gross per 24 hour  Intake 2897.24 ml  Output 1910 ml  Net 987.24 ml   Filed Weights   11/15/17 2200 11/16/17 0457 11/17/17 0500  Weight: 88.2 kg (194 lb 7.1 oz) 89.9 kg (198 lb 3.1 oz) 83.8 kg (184 lb 11.9 oz)    Exam:   General: NAD  Cardiovascular: S1, S2 present  Respiratory: Bilateral crackles noted  Abdomen: Soft, nontender, nondistended, bowel sounds hypoactive  Musculoskeletal: No pedal edema bilaterally  Skin: Normal  Psychiatry: Normal mood   Data  Reviewed: CBC: Recent Labs  Lab 11/11/17 1819 11/13/17 0525 11/14/17 0544 11/15/17 0500 11/15/17 2121 11/17/17 0519  WBC 9.0 7.4 7.2 14.6* 16.5* 26.4*  NEUTROABS 6.6  --   --  12.2* 14.6* 22.9  HGB 13.3 12.1* 12.0* 10.4*  10.4* 9.7*  HCT 39.7 36.4* 36.1* 30.4* 30.9* 28.4*  MCV 96.6 96.6 96.3 96.2 96.9 96.9  PLT 288 319 342 304 241 376   Basic Metabolic Panel: Recent Labs  Lab 11/14/17 0544 11/15/17 0500 11/15/17 2121 11/16/17 0516 11/17/17 0519  NA 141 141 142 140 141  K 3.4* 3.5 3.5 4.0 3.2*  CL 106 108 110 108 106  CO2 24 27 23 23 29   GLUCOSE 82 242* 188* 189* 248*  BUN 15 15 20 22 19   CREATININE 0.80 0.89 1.35* 1.31* 1.00  CALCIUM 8.4* 7.9* 8.1* 7.9* 7.9*  MG 1.8 1.6* 2.0 1.9 2.0  PHOS 2.9 1.8*  --  3.5 2.4*   GFR: Estimated Creatinine Clearance: 66.2 mL/min (by C-G formula based on SCr of 1 mg/dL). Liver Function Tests: Recent Labs  Lab 11/14/17 0544 11/15/17 0500 11/15/17 2121 11/16/17 0516  AST 16 23 44* 36  ALT 14 19 29 27   ALKPHOS 38 40 70 57  BILITOT 1.9* 2.3* 2.8* 2.9*  PROT 6.0* 5.1* 5.3* 5.5*  ALBUMIN 3.1* 2.5* 2.5* 2.6*   No results for input(s): LIPASE, AMYLASE in the last 168 hours. No results for input(s): AMMONIA in the last 168 hours. Coagulation Profile: No results for input(s): INR, PROTIME in the last 168 hours. Cardiac Enzymes: No results for input(s): CKTOTAL, CKMB, CKMBINDEX, TROPONINI in the last 168 hours. BNP (last 3 results) No results for input(s): PROBNP in the last 8760 hours. HbA1C: No results for input(s): HGBA1C in the last 72 hours. CBG: Recent Labs  Lab 11/16/17 1154 11/16/17 1745 11/16/17 2315 11/17/17 0633 11/17/17 1227  GLUCAP 203* 188* 254* 231* 297*   Lipid Profile: Recent Labs    11/15/17 0500  TRIG 102   Thyroid Function Tests: No results for input(s): TSH, T4TOTAL, FREET4, T3FREE, THYROIDAB in the last 72 hours. Anemia Panel: No results for input(s): VITAMINB12, FOLATE, FERRITIN, TIBC, IRON, RETICCTPCT in the last 72 hours. Urine analysis:    Component Value Date/Time   COLORURINE AMBER (A) 11/15/2017 2140   APPEARANCEUR HAZY (A) 11/15/2017 2140   LABSPEC 1.011 11/15/2017 2140   PHURINE 5.0 11/15/2017 2140    GLUCOSEU NEGATIVE 11/15/2017 2140   HGBUR LARGE (A) 11/15/2017 2140   BILIRUBINUR NEGATIVE 11/15/2017 2140   KETONESUR NEGATIVE 11/15/2017 2140   PROTEINUR NEGATIVE 11/15/2017 2140   UROBILINOGEN 0.2 09/12/2014 1816   NITRITE NEGATIVE 11/15/2017 2140   LEUKOCYTESUR NEGATIVE 11/15/2017 2140   Sepsis Labs: @LABRCNTIP (procalcitonin:4,lacticidven:4)  ) Recent Results (from the past 240 hour(s))  MRSA PCR Screening     Status: None   Collection Time: 11/15/17  7:54 PM  Result Value Ref Range Status   MRSA by PCR NEGATIVE NEGATIVE Final    Comment:        The GeneXpert MRSA Assay (FDA approved for NASAL specimens only), is one component of a comprehensive MRSA colonization surveillance program. It is not intended to diagnose MRSA infection nor to guide or monitor treatment for MRSA infections. Performed at [facility], [place] [address], [place], [place] [address]   Culture, blood (routine x 2) Call MD if unable to obtain prior to antibiotics being given     Status: Abnormal (Preliminary result)   Collection Time: 11/15/17  9:01 PM  Result Value Ref Range Status   Specimen Description   Final    BLOOD LEFT ANTECUBITAL Performed at [facility] [address] [place], [place] [address]    Special Requests   Final    BOTTLES DRAWN AEROBIC AND ANAEROBIC Blood Culture adequate volume Performed at [facility] [address] [place], [place] [address]    Culture  Setup Time   Final    GRAM NEGATIVE RODS IN BOTH AEROBIC AND ANAEROBIC BOTTLES CRITICAL RESULT CALLED TO, READ BACK BY AND VERIFIED WITH: Sheffield Slider PharmD 14:15 11/16/17 ([email or web address]) Performed at [facility], [place] [address], [place], [place] [address]    Culture KLEBSIELLA OXYTOCA (A)  Final   Report Status PENDING  Incomplete  Blood Culture ID Panel (Reflexed)     Status: Abnormal   Collection Time: 11/15/17  9:01 PM  Result Value Ref Range Status    Enterococcus species NOT DETECTED NOT DETECTED Final   Listeria monocytogenes NOT DETECTED NOT DETECTED Final   Staphylococcus species NOT DETECTED NOT DETECTED Final   Staphylococcus aureus NOT DETECTED NOT DETECTED Final   Streptococcus species NOT DETECTED NOT DETECTED Final   Streptococcus agalactiae NOT DETECTED NOT DETECTED Final   Streptococcus pneumoniae NOT DETECTED NOT DETECTED Final   Streptococcus pyogenes NOT DETECTED NOT DETECTED Final   Acinetobacter baumannii NOT DETECTED NOT DETECTED Final   Enterobacteriaceae species DETECTED (A) NOT DETECTED Final    Comment: Enterobacteriaceae represent a large family of gram-negative bacteria, not a single organism. CRITICAL RESULT CALLED TO, READ BACK BY AND VERIFIED WITH: Sheffield Slider PharmD 14:15 11/16/17 ([email or web address])    Enterobacter cloacae complex NOT DETECTED NOT DETECTED Final   Escherichia coli NOT DETECTED NOT DETECTED Final   Klebsiella oxytoca DETECTED (A) NOT DETECTED Final    Comment: CRITICAL RESULT CALLED TO, READ BACK BY AND VERIFIED WITH: Sheffield Slider PharmD 14:15 11/16/17 ([email or web address])    Klebsiella pneumoniae NOT DETECTED NOT DETECTED Final   Proteus species NOT DETECTED NOT DETECTED Final   Serratia marcescens NOT DETECTED NOT DETECTED Final   Carbapenem resistance NOT DETECTED NOT DETECTED Final   Haemophilus influenzae NOT DETECTED NOT DETECTED Final   Neisseria meningitidis NOT DETECTED NOT DETECTED Final   Pseudomonas aeruginosa NOT DETECTED NOT DETECTED Final   Candida albicans NOT DETECTED NOT DETECTED Final   Candida glabrata NOT DETECTED NOT DETECTED Final   Candida krusei NOT DETECTED NOT DETECTED Final   Candida parapsilosis NOT DETECTED NOT DETECTED Final   Candida tropicalis NOT DETECTED NOT DETECTED Final  Culture, blood (routine x 2) Call MD if unable to obtain prior to antibiotics being given     Status: None (Preliminary result)   Collection Time: 11/15/17  9:12 PM  Result Value Ref Range Status    Specimen Description   Final    BLOOD LEFT HAND Performed at [facility], [place] [address], [place], [place] [address]    Special Requests   Final    BOTTLES DRAWN AEROBIC AND ANAEROBIC Blood Culture adequate volume Performed at [facility] [address], [place], [place] [address]    Culture  Setup Time   Final    GRAM NEGATIVE RODS IN BOTH AEROBIC AND ANAEROBIC BOTTLES CRITICAL VALUE NOTED.  VALUE IS CONSISTENT WITH PREVIOUSLY REPORTED AND CALLED VALUE. Performed at [facility], [place] [address] [place], [place] [address]    Culture GRAM NEGATIVE RODS  Final   Report  Status PENDING  Incomplete  Culture, Urine     Status: None   Collection Time: 11/15/17  9:40 PM  Result Value Ref Range Status   Specimen Description   Final    URINE, RANDOM Performed at [facility] [address], [place], [place] [address]    Special Requests   Final    NONE Performed at [facility], [place] [address] [place], [place] [address]    Culture   Final    NO GROWTH Performed at [facility], [place] [address], [place], [place] [address]    Report Status 11/17/2017 FINAL  Final      Studies: Dg Chest Port 1 View  Result Date: 11/17/2017 CLINICAL DATA:  Pneumonia EXAM: PORTABLE CHEST 1 VIEW COMPARISON:  Portable exam 0418 hours compared to 11/15/2017 FINDINGS: Rotated to the LEFT. RIGHT arm PICC line tip projects over SVC. Enlargement of cardiac silhouette. Atherosclerotic calcification aorta. Patchy BILATERAL pulmonary infiltrates consistent with pneumonia, greatest in RIGHT upper lobe. RIGHT lung infiltrates have increased since previous exam particularly in the lower RIGHT lung. No pleural effusion or pneumothorax. Hiatal hernia. Osseous demineralization. IMPRESSION: Increased pulmonary infiltrates since 11/15/2017 consistent with multifocal pneumonia. Enlargement of cardiac silhouette. Electronically Signed    By: Lavonia Dana M.D.   On: 11/17/2017 08:33   Dg Abd Portable 1v  Result Date: 11/17/2017 CLINICAL DATA:  Abdominal pain and distention. EXAM: PORTABLE ABDOMEN - 1 VIEW COMPARISON:  Radiographs of November 16, 2017. FINDINGS: No small bowel dilatation is noted currently, which is significantly improved compared to prior exam. Residual contrast is noted within small bowel and colon. Phleboliths are noted in the pelvis. IMPRESSION: No bowel dilatation is noted currently. Electronically Signed   By: Marijo Conception, M.D.   On: 11/17/2017 08:57    Scheduled Meds: . Chlorhexidine Gluconate Cloth  6 each Topical Daily  . enoxaparin (LOVENOX) injection  40 mg Subcutaneous Daily  . insulin aspart  0-20 Units Subcutaneous Q6H  . ipratropium-albuterol  3 mL Nebulization BID  . mouth rinse  15 mL Mouth Rinse BID  . metoprolol tartrate  10 mg Intravenous Q6H    Continuous Infusions: . [name] KitchenTPN (CLINIMIX-E) Adult 70 mL/hr at 11/16/17 1718  . [name] KitchenTPN (CLINIMIX-E) Adult     And  . Fat emulsion    . potassium chloride       LOS: 11 days     Alma Friendly, MD [facility]   If 7PM-7AM, please contact night-coverage [email or web address] Password [facility] 11/17/2017, 4:12 PM

## 2017-11-17 NOTE — Progress Notes (Signed)
Pharmacy Antibiotic Note  Oscar [name] is a 84 y.o. male admitted on 11/06/2017 with SBO.  Pharmacy has been consulted for vancomycin and zosyn dosing for PNA then blood cultures grew K. Oxytoca and patient narrowed to ceftriaxone.  On 7/12 patient with orders to change to vancomycin and meropenem.  Appears patient has developed decreased O2 sats.  CXR 7/12 am revealed increased pulmonary infiltrates.  He remains afebrile but WBC trending up.   Patient has SBO, currently on TPN.  Antibiotics being started for possible PNA. MD ordered cefepime.  Today, 11/17/17  PCT elevated but improving  WBC inc to 26  Afebrile  SCr is improving from recent AKI  Plan:  Vancomycin 1500mg  IV x 1 then 750mg  IV q12h  Monitor SCr daily d/t recent AKI  Check vancomycin levels as needed  Meropenem 1gm IV q6h   Await final susceptibilities of Klebsiella bacteremia, Repeat BCx pending  Follow-up ability to de-escalate antibiotics   Height: 6\' 2"  (188 cm) Weight: 184 lb 11.9 oz (83.8 kg) IBW/kg (Calculated) : 82.2  Temp (24hrs), Avg:98.5 F (36.9 C), Min:98 F (36.7 C), Max:99.5 F (37.5 C)  Recent Labs  Lab 11/13/17 0525 11/14/17 0544 11/15/17 0500 11/15/17 2121 11/16/17 0516 11/17/17 0519  WBC 7.4 7.2 14.6* 16.5*  --  26.4*  CREATININE 0.90 0.80 0.89 1.35* 1.31* 1.00    Estimated Creatinine Clearance: 66.2 mL/min (by C-G formula based on SCr of 1 mg/dL).    Allergies  Allergen Reactions  . Bee Venom Anaphylaxis  . Tamsulosin Other (See Comments)    "Makes me feel weird"   Antimicrobials this admission:  7/3 Fluconazole 100 mg IV >> 7/5  7/10 Zosyn >> 7/11 7/10 Vanc >> 7/11, 7/12 7/11 ceftriaxone >> 7/12 7/12 meropenem  Dose adjustments this admission:   Microbiology results:  7/10 BCx: K. oxytoca      7/11 BCID: Klebsiella, no carbapenem resistance detected 7/10 UCx:  NG 7/10 MRSA PCR: negative 7/12 BCx  Doreene Eland, PharmD, BCPS.   Pager: [phone] 11/17/2017  4:38 PM

## 2017-11-17 NOTE — Progress Notes (Signed)
PT Cancellation Note  Patient Details Name: Oscar [name] MRN: [id number] DOB: 12/01/1932   Cancelled Treatment:     unable to participate due to medical status and multiple tests   Rica Koyanagi  PTA [facility]  Acute  Rehab Pager      [phone]

## 2017-11-17 NOTE — Progress Notes (Signed)
[facility] Progress Note     Subjective: CC: wants to eat Patient wants to eat something, explained the risk of aspiration again and patient states he understands. Patient denies abdominal pain or distention. Denies nausea but is holding an emesis bag. Patient denies SOB or chest pain. He is coughing a lot. I discussed with patient the events of the last several days and he remembers some things but tells me he does not remember refusing surgery last week.   Objective: Vital signs in last 24 hours: Temp:  [98.1 F (36.7 C)-99.5 F (37.5 C)] 98.7 F (37.1 C) (07/12 0406) Resp:  [20-34] 23 (07/12 0622) BP: (110-155)/(40-99) 139/99 (07/12 0622) SpO2:  [90 %-100 %] 94 % (07/12 0622) Weight:  [83.8 kg (184 lb 11.9 oz)] 83.8 kg (184 lb 11.9 oz) (07/12 0500) Last BM Date: 11/16/17(flexiseal)  Intake/Output from previous day: 07/11 0701 - 07/12 0700 In: 2666.9 [I.V.:1546.9; IV Piggyback:120] Out: 1585 [Urine:1525; Stool:60] Intake/Output this shift: No intake/output data recorded.  PE: Gen:  Alert, NAD, pleasant Card:  Regular rate and rhythm, pedal pulses 2+ BL Pulm:  Normal effort, mild basilar rales bilaterally  Abd: Soft, non-tender, non-distended, bowel sounds present, no HSM, surgical scar Skin: warm and dry, no rashes  Psych: A&Ox3   Lab Results:  Recent Labs    11/15/17 2121 11/17/17 0519  WBC 16.5* 26.4*  HGB 10.4* 9.7*  HCT 30.9* 28.4*  PLT 241 234   BMET Recent Labs    11/16/17 0516 11/17/17 0519  NA 140 141  K 4.0 3.2*  CL 108 106  CO2 23 29  GLUCOSE 189* 248*  BUN 22 19  CREATININE 1.31* 1.00  CALCIUM 7.9* 7.9*   PT/INR No results for input(s): LABPROT, INR in the last 72 hours. CMP     Component Value Date/Time   NA 141 11/17/2017 0519   K 3.2 (L) 11/17/2017 0519   CL 106 11/17/2017 0519   CO2 29 11/17/2017 0519   GLUCOSE 248 (H) 11/17/2017 0519   BUN 19 11/17/2017 0519   CREATININE 1.00 11/17/2017 0519   CREATININE 1.13  12/07/2012 1147   CALCIUM 7.9 (L) 11/17/2017 0519   PROT 5.5 (L) 11/16/2017 0516   ALBUMIN 2.6 (L) 11/16/2017 0516   AST 36 11/16/2017 0516   ALT 27 11/16/2017 0516   ALKPHOS 57 11/16/2017 0516   BILITOT 2.9 (H) 11/16/2017 0516   GFRNONAA >60 11/17/2017 0519   GFRAA >60 11/17/2017 0519   Lipase     Component Value Date/Time   LIPASE 22 11/06/2017 0735       Studies/Results: Dg Chest Port 1 View  Result Date: 11/16/2017 CLINICAL DATA:  Cough. EXAM: PORTABLE CHEST 1 VIEW COMPARISON:  Radiograph of November 15, 2017. FINDINGS: Stable cardiomegaly. Atherosclerosis of thoracic aorta is noted. Right-sided PICC line is unchanged in position. No pneumothorax is noted. No significant pleural effusion is noted. Increased right upper lobe airspace opacity is noted concerning for pneumonia. Stable bibasilar subsegmental atelectasis. IMPRESSION: Increased right upper lobe airspace opacity is noted concerning for pneumonia. Aortic Atherosclerosis (ICD10-I70.0). Electronically Signed   By: Marijo Conception, M.D.   On: 11/16/2017 13:30   Dg Chest Port 1 View  Result Date: 11/15/2017 CLINICAL DATA:  Acute respiratory distress. Fever. Sudden episode of vomiting EXAM: PORTABLE CHEST 1 VIEW COMPARISON:  Three days ago FINDINGS: Airspace opacity in the right upper and left lower lobes. Cardiomegaly. Status post esophagectomy. High-density material still seen within the pull-through, with barium study 2  days ago. PICC on the right with tip at the SVC. IMPRESSION: 1. Bilateral airspace disease primarily concerning for aspiration in this clinical setting. 2. Esophagram 2 days ago with small volume high-density material still seen within the gastric pull-through. Electronically Signed   By: Monte Fantasia M.D.   On: 11/15/2017 20:45   Dg Abd Portable 1v  Result Date: 11/16/2017 CLINICAL DATA:  Small bowel obstruction. EXAM: PORTABLE ABDOMEN - 1 VIEW COMPARISON:  Radiographs of November 15, 2017. FINDINGS: Continued  small bowel dilatation is noted. Residual contrast remains within small bowel loops as well as: Consistent with partial small bowel obstruction. Surgical clips are noted in the left upper quadrant. IMPRESSION: Findings consistent with partial small bowel obstruction. Electronically Signed   By: Marijo Conception, M.D.   On: 11/16/2017 13:29   Dg Abd Portable 1v  Result Date: 11/15/2017 CLINICAL DATA:  Acute respiratory distress EXAM: PORTABLE ABDOMEN - 1 VIEW COMPARISON:  Yesterday FINDINGS: Dilated small bowel with oral contrast seen diffusely. Under distended colon also has contrast. No concerning mass effect. IMPRESSION: Ongoing small bowel obstruction, partial based on colonic opacification. Electronically Signed   By: Monte Fantasia M.D.   On: 11/15/2017 22:13    Anti-infectives: Anti-infectives (From admission, onward)   Start     Dose/Rate Route Frequency Ordered Stop   11/16/17 2200  cefTRIAXone (ROCEPHIN) 2 g in sodium chloride 0.9 % 100 mL IVPB     2 g 200 mL/hr over 30 Minutes Intravenous Every 24 hours 11/16/17 1447     11/16/17 2100  vancomycin (VANCOCIN) 1,500 mg in sodium chloride 0.9 % 500 mL IVPB  Status:  Discontinued     1,500 mg 250 mL/hr over 120 Minutes Intravenous Every 24 hours 11/15/17 1951 11/16/17 1131   11/15/17 2200  piperacillin-tazobactam (ZOSYN) IVPB 3.375 g  Status:  Discontinued     3.375 g 12.5 mL/hr over 240 Minutes Intravenous Every 8 hours 11/15/17 2137 11/16/17 1447   11/15/17 2030  vancomycin (VANCOCIN) 1,750 mg in sodium chloride 0.9 % 500 mL IVPB     1,750 mg 250 mL/hr over 120 Minutes Intravenous  Once 11/15/17 1948 11/15/17 2330   11/15/17 2000  ceFEPIme (MAXIPIME) 1 g in sodium chloride 0.9 % 100 mL IVPB  Status:  Discontinued     1 g 200 mL/hr over 30 Minutes Intravenous Every 8 hours 11/15/17 1932 11/15/17 2132   11/08/17 1200  fluconazole (DIFLUCAN) IVPB 100 mg  Status:  Discontinued     100 mg 50 mL/hr over 60 Minutes Intravenous Every 24  hours 11/08/17 1032 11/10/17 0934       Assessment/Plan Hx of esophageal CA - s/p esophagectomy 2001_ [facility] HTN T2DM HLD  BPH Dysphagia  SBO- admitted 11/06/17 - NG:EXBMWUX fluid-filled small bowel loops are noted in the abdomen and pelvis. Distal small bowel loops are decompressed. Findings compatible with small-bowel obstruction. Transition point appears to be in the midline of the pelvis -NGT unable to be placed in radiology, pt also refused placement intermittently -KUByesterday consistent with PSBO - repeat KUB this AM - patient had several BMs yesterday and has had small amount stool output overnight - we will follow- refused surgery x 2 last week with Dr. Marlou Starks Aspiration PNA- CXR yesterday with increased RUL opacity, CXR this AM not yet read but appears to be slightly worsened bilaterally - clinically patient is maintaining sats in the low 90s on room air Leukocytosis - WBC 26.4, likely related to PNA; afebrile Hypokalemia-K 3.2,  replacement per pharmacy Hypomagnesemia - Mag 2.0, improved  ModerateMalnutritionrelated to chronic illness- Prealbumin 6.1, TPN started yesterday  FEN: NPO; IVF, TPN VTE: SCDs, lovenox HK:[id number] 7/3>7/5  Plan:Continue treatment for aspiration PNA. Patient may try a few ice chips today but would allow these sparingly. Mobilize as tolerated.   If SBO continuing to persist patient will need exploratory surgery, but I am concerned that nutritional status and recent aspiration may complicate recovery if patient does undergo an operation.     LOS: 11 days    Brigid Re , [facility] 11/17/2017, 7:19 AM Pager: [phone] Consults: [phone] Mon-Fri 7:00 am-4:30 pm Sat-Sun 7:00 am-11:30 am

## 2017-11-17 NOTE — Progress Notes (Signed)
PHARMACY - ADULT TOTAL PARENTERAL NUTRITION CONSULT NOTE   Pharmacy Consult for TPN Indication: bowel obstruction  Patient Measurements: Height: '6\' 2"'  (188 cm) Weight: 184 lb 11.9 oz (83.8 kg) IBW/kg (Calculated) : 82.2 TPN AdjBW (KG): 88.9 Body mass index is 23.72 kg/m.  Insulin Requirements: 21 units via sliding scale/24 hours  Current Nutrition: NPO except for ice chips  IVF: IVF d/c (added Lasix 1m IV q12h)  Central access: PICC placed 7/9 TPN start date: 7/9  ASSESSMENT                                                                                                          HPI:  [id number] with PMH of HTN, remote esophageal cancer in remission, OSA, DM controlled with diet and exercise who [facility] with abdominal pain, n/v and found to have small bowel obstruction. Abdominal film today c/w ongoing partial small bowel obstruction. Pharmacy consulted for TPN.   Significant events:  7/9-7/12 Reglan 5 mg q6h 7/10 Aspirated overnight, Vomit reportedly feculent smelling/appearing.  Transferred to ICU.  Today:   Glucose - CBGs 188-254 (Goal < 150)  Electrolytes - K 3.2 and Phos 2.4 are low, others Na, Cl, CorrCa, Mg are WNL.  Renal - SCr 1 (trending down)  LFTs - AST/ALT, Alk Phos WNL. Tbili elevated/increased to 2.9  TGs - 102 (7/10)  Prealbumin - 6.1 (7/9)  NUTRITIONAL GOALS                                                                                             RD recs (7/9): Kcal:  2400-2600 kcal , Protein:  120-130 g  Clinimix 5/20 at a goal rate of 1079mhr + 20% fat emulsion at 2068mr x 12h to provide:  120 g/day protein, 2592 kcal/day.  PLAN                                                                                                                          Now:  Potassium phosphate 10 mmol IV once  KCl 10 mEq IV x 3 runs  At 1800 today:  Continue Clinimix E 5/20 at 70 mL/hr -  will hold off on advancing rate given low K and  Phos.  Continue 20% fat emulsion at 38m/hr x 12h.  Plan to advance as tolerated to the goal rate on electrolytes normalize  TPN to contain standard multivitamins and trace elements. Monitor Tbili closely.   Continue famotidine 476min TPN bag.    Continue moderate SSI and CBGs q6h - increase to resistant scale given elevated BG  IVF per MD  TPN lab panels in AM and on Mondays & Thursdays.  F/u daily.  BMET, Mag, Phos ordered daily Friday-Sunday.   MaTheodis ShovePharmD, BCPS Clinical Pharmacist Pager: [phone]/12/19 11:43 AM

## 2017-11-17 NOTE — Progress Notes (Signed)
  Echocardiogram 2D Echocardiogram has been performed.  Oscar [name] F 11/17/2017, 10:59 AM

## 2017-11-18 DIAGNOSIS — B961 Klebsiella pneumoniae [K. pneumoniae] as the cause of diseases classified elsewhere: Secondary | ICD-10-CM

## 2017-11-18 DIAGNOSIS — Z95828 Presence of other vascular implants and grafts: Secondary | ICD-10-CM

## 2017-11-18 DIAGNOSIS — Z888 Allergy status to other drugs, medicaments and biological substances status: Secondary | ICD-10-CM

## 2017-11-18 DIAGNOSIS — Z91030 Bee allergy status: Secondary | ICD-10-CM

## 2017-11-18 LAB — GLUCOSE, CAPILLARY
GLUCOSE-CAPILLARY: 213 mg/dL — AB (ref 70–99)
GLUCOSE-CAPILLARY: 230 mg/dL — AB (ref 70–99)
Glucose-Capillary: 284 mg/dL — ABNORMAL HIGH (ref 70–99)
Glucose-Capillary: 258 mg/dL — ABNORMAL HIGH (ref 70–99)
Glucose-Capillary: 237 mg/dL — ABNORMAL HIGH (ref 70–99)
Glucose-Capillary: 286 mg/dL — ABNORMAL HIGH (ref 70–99)

## 2017-11-18 LAB — BASIC METABOLIC PANEL
ANION GAP: 8 (ref 5–15)
BUN: 16 mg/dL (ref 8–23)
CALCIUM: 8 mg/dL — AB (ref 8.9–10.3)
CO2: 28 mmol/L (ref 22–32)
CREATININE: 0.98 mg/dL (ref 0.61–1.24)
Chloride: 103 mmol/L (ref 98–111)
GFR calc Af Amer: >60 mL/min (ref >60)
GLUCOSE: 297 mg/dL — AB (ref 70–99)
Potassium: 3.6 mmol/L (ref 3.5–5.1)
Sodium: 139 mmol/L (ref 135–145)

## 2017-11-18 LAB — CBC WITH DIFFERENTIAL/PLATELET
BASOS PCT: 0 %
Basophils Absolute: 0 10*3/uL (ref 0.0–0.1)
EOS PCT: 0 %
Eosinophils Absolute: 0 10*3/uL (ref 0.0–0.7)
HEMATOCRIT: 27.7 % — AB (ref 39.0–52.0)
Hemoglobin: 9.4 g/dL — ABNORMAL LOW (ref 13.0–17.0)
LYMPHS ABS: 1.3 10*3/uL (ref 0.7–4.0)
Lymphocytes Relative: 5 %
MCH: 32.8 pg (ref 26.0–34.0)
MCHC: 33.9 g/dL (ref 30.0–36.0)
MCV: 96.5 fL (ref 78.0–100.0)
MONOS PCT: 6 %
Monocytes Absolute: 1.6 10*3/uL — ABNORMAL HIGH (ref 0.1–1.0)
NEUTROS ABS: 23.7 10*3/uL — AB (ref 1.7–7.7)
Neutrophils Relative %: 89 %
PLATELETS: 265 10*3/uL (ref 150–400)
RBC: 2.87 MIL/uL — AB (ref 4.22–5.81)
RDW: 13.1 % (ref 11.5–15.5)
WBC: 26.6 10*3/uL — AB (ref 4.0–10.5)

## 2017-11-18 LAB — CULTURE, BLOOD (ROUTINE X 2)
Special Requests: ADEQUATE
Special Requests: ADEQUATE

## 2017-11-18 LAB — MAGNESIUM: Magnesium: 1.8 mg/dL (ref 1.7–2.4)

## 2017-11-18 LAB — PROCALCITONIN: Procalcitonin: 18.21 ng/mL

## 2017-11-18 LAB — PHOSPHORUS: Phosphorus: 2.3 mg/dL — ABNORMAL LOW (ref 2.5–4.6)

## 2017-11-18 MED ORDER — FAT EMULSION PLANT BASED 20 % IV EMUL
240.0000 mL | INTRAVENOUS | Status: AC
  Administered 2017-11-18: 240 mL via INTRAVENOUS
  Filled 2017-11-18: qty 250

## 2017-11-18 MED ORDER — INSULIN GLARGINE 100 UNIT/ML ~~LOC~~ SOLN
10.0000 [IU] | Freq: Two times a day (BID) | SUBCUTANEOUS | Status: DC
  Administered 2017-11-18 – 2017-11-21 (×6): 10 [IU] via SUBCUTANEOUS
  Filled 2017-11-18 (×7): qty 0.1

## 2017-11-18 MED ORDER — MAGNESIUM SULFATE 2 GM/50ML IV SOLN
2.0000 g | Freq: Once | INTRAVENOUS | Status: AC
  Administered 2017-11-18: 2 g via INTRAVENOUS
  Filled 2017-11-18: qty 50

## 2017-11-18 MED ORDER — DEXTROSE 5 % IV SOLN
10.0000 mmol | Freq: Once | INTRAVENOUS | Status: AC
  Administered 2017-11-18: 10 mmol via INTRAVENOUS
  Filled 2017-11-18: qty 3.33

## 2017-11-18 MED ORDER — MORPHINE SULFATE (PF) 2 MG/ML IV SOLN
2.0000 mg | Freq: Once | INTRAVENOUS | Status: AC
  Administered 2017-11-18: 2 mg via INTRAVENOUS

## 2017-11-18 MED ORDER — TRACE MINERALS CR-CU-MN-SE-ZN 10-1000-500-60 MCG/ML IV SOLN
INTRAVENOUS | Status: AC
  Administered 2017-11-18: 18:00:00 via INTRAVENOUS
  Filled 2017-11-18 (×2): qty 1680

## 2017-11-18 NOTE — Progress Notes (Signed)
Patient ID: Oscar [name], male   DOB: 1932/10/01, 84 y.o.   MRN: [id number]         [facility] for Infectious Disease  Date of Admission:  11/06/2017    Total days of antibiotics 4        Day 3 ceftriaxone         ASSESSMENT: He developed Klebsiella bacteremia in the setting of small bowel obstruction.  He also appears to be developed aspiration pneumonitis but he is clinically improving.  PLAN: 1. Continue ceftriaxone 2. Please call me for any infectious disease questions this weekend  Principal Problem:   SBO (small bowel obstruction) (HCC) Active Problems:   HTN (hypertension)   OSA (obstructive sleep apnea)   Acute on chronic respiratory failure with hypoxia (HCC)   Scheduled Meds: . Chlorhexidine Gluconate Cloth  6 each Topical Daily  . enoxaparin (LOVENOX) injection  40 mg Subcutaneous Daily  . insulin aspart  0-20 Units Subcutaneous Q6H  . insulin glargine  10 Units Subcutaneous QHS  . ipratropium-albuterol  3 mL Nebulization BID  . mouth rinse  15 mL Mouth Rinse BID  . metoprolol tartrate  10 mg Intravenous Q6H   Continuous Infusions: . [name] KitchenTPN (CLINIMIX-E) Adult 70 mL/hr at 11/17/17 1705  . cefTRIAXone (ROCEPHIN)  IV Stopped (11/17/17 1915)   PRN Meds:.acetaminophen **OR** acetaminophen, bisacodyl, hydrALAZINE, ipratropium-albuterol, morphine injection, ondansetron **OR** ondansetron (ZOFRAN) IV, phenol, promethazine **OR** promethazine, sodium chloride flush   Review of Systems: Review of Systems  Unable to perform ROS: Mental acuity    Allergies  Allergen Reactions  . Bee Venom Anaphylaxis  . Tamsulosin Other (See Comments)    "Makes me feel weird"    OBJECTIVE: Vitals:   11/18/17 0500 11/18/17 0547 11/18/17 0742 11/18/17 0747  BP:  (!) 154/57 (!) 162/67   Pulse:      Resp: (!) 32 (!) 21 (!) 21   Temp:    98.7 F (37.1 C)  TempSrc:    Oral  SpO2: 90% 92% (!) 88%   Weight: 196 lb 10.4 oz (89.2 kg)     Height:       Body mass index is  25.25 kg/m.  Physical Exam  Constitutional:  He is sound asleep reclining in a chair.  Cardiovascular: Normal rate, regular rhythm and normal heart sounds.  No murmur heard. Pulmonary/Chest: Effort normal and breath sounds normal.  Room air O2 sats 90 88-96%   Abdominal: Soft. He exhibits no distension. There is no tenderness.  Skin:  PICC site looks good    Lab Results Lab Results  Component Value Date   WBC 26.6 (H) 11/18/2017   HGB 9.4 (L) 11/18/2017   HCT 27.7 (L) 11/18/2017   MCV 96.5 11/18/2017   PLT 265 11/18/2017    Lab Results  Component Value Date   CREATININE 0.98 11/18/2017   BUN 16 11/18/2017   NA 139 11/18/2017   K 3.6 11/18/2017   CL 103 11/18/2017   CO2 28 11/18/2017    Lab Results  Component Value Date   ALT 27 11/16/2017   AST 36 11/16/2017   ALKPHOS 57 11/16/2017   BILITOT 2.9 (H) 11/16/2017     Microbiology: Recent Results (from the past 240 hour(s))  MRSA PCR Screening     Status: None   Collection Time: 11/15/17  7:54 PM  Result Value Ref Range Status   MRSA by PCR NEGATIVE NEGATIVE Final    Comment:        The GeneXpert  MRSA Assay (FDA approved for NASAL specimens only), is one component of a comprehensive MRSA colonization surveillance program. It is not intended to diagnose MRSA infection nor to guide or monitor treatment for MRSA infections. Performed at [facility], [place] [address], [place], [place] [address]   Culture, blood (routine x 2) Call MD if unable to obtain prior to antibiotics being given     Status: Abnormal   Collection Time: 11/15/17  9:01 PM  Result Value Ref Range Status   Specimen Description   Final    BLOOD LEFT ANTECUBITAL Performed at [place] [address] [place], [place] [address]    Special Requests   Final    BOTTLES DRAWN AEROBIC AND ANAEROBIC Blood Culture adequate volume Performed at [facility] [address], [place],  [place] [address]    Culture  Setup Time   Final    GRAM NEGATIVE RODS IN BOTH AEROBIC AND ANAEROBIC BOTTLES CRITICAL RESULT CALLED TO, READ BACK BY AND VERIFIED WITH: Sheffield Slider PharmD 14:15 11/16/17 ([email or web address]) Performed at [facility], [place] [address], [place], [place] [address]    Culture KLEBSIELLA OXYTOCA (A)  Final   Report Status 11/18/2017 FINAL  Final   Organism ID, Bacteria KLEBSIELLA OXYTOCA  Final      Susceptibility   Klebsiella oxytoca - MIC*    AMPICILLIN >=32 RESISTANT Resistant     CEFAZOLIN <=4 SENSITIVE Sensitive     CEFEPIME <=1 SENSITIVE Sensitive     CEFTAZIDIME <=1 SENSITIVE Sensitive     CEFTRIAXONE <=1 SENSITIVE Sensitive     CIPROFLOXACIN <=0.25 SENSITIVE Sensitive     GENTAMICIN <=1 SENSITIVE Sensitive     IMIPENEM <=0.25 SENSITIVE Sensitive     TRIMETH/SULFA <=20 SENSITIVE Sensitive     AMPICILLIN/SULBACTAM 4 SENSITIVE Sensitive     PIP/TAZO <=4 SENSITIVE Sensitive     * KLEBSIELLA OXYTOCA  Blood Culture ID Panel (Reflexed)     Status: Abnormal   Collection Time: 11/15/17  9:01 PM  Result Value Ref Range Status   Enterococcus species NOT DETECTED NOT DETECTED Final   Listeria monocytogenes NOT DETECTED NOT DETECTED Final   Staphylococcus species NOT DETECTED NOT DETECTED Final   Staphylococcus aureus NOT DETECTED NOT DETECTED Final   Streptococcus species NOT DETECTED NOT DETECTED Final   Streptococcus agalactiae NOT DETECTED NOT DETECTED Final   Streptococcus pneumoniae NOT DETECTED NOT DETECTED Final   Streptococcus pyogenes NOT DETECTED NOT DETECTED Final   Acinetobacter baumannii NOT DETECTED NOT DETECTED Final   Enterobacteriaceae species DETECTED (A) NOT DETECTED Final    Comment: Enterobacteriaceae represent a large family of gram-negative bacteria, not a single organism. CRITICAL RESULT CALLED TO, READ BACK BY AND VERIFIED WITH: Sheffield Slider PharmD 14:15 11/16/17 ([email or web address])    Enterobacter cloacae complex NOT DETECTED NOT DETECTED Final    Escherichia coli NOT DETECTED NOT DETECTED Final   Klebsiella oxytoca DETECTED (A) NOT DETECTED Final    Comment: CRITICAL RESULT CALLED TO, READ BACK BY AND VERIFIED WITH: Sheffield Slider PharmD 14:15 11/16/17 ([email or web address])    Klebsiella pneumoniae NOT DETECTED NOT DETECTED Final   Proteus species NOT DETECTED NOT DETECTED Final   Serratia marcescens NOT DETECTED NOT DETECTED Final   Carbapenem resistance NOT DETECTED NOT DETECTED Final   Haemophilus influenzae NOT DETECTED NOT DETECTED Final   Neisseria meningitidis NOT DETECTED NOT DETECTED Final   Pseudomonas aeruginosa NOT DETECTED NOT DETECTED Final   Candida albicans NOT DETECTED NOT DETECTED Final  Candida glabrata NOT DETECTED NOT DETECTED Final   Candida krusei NOT DETECTED NOT DETECTED Final   Candida parapsilosis NOT DETECTED NOT DETECTED Final   Candida tropicalis NOT DETECTED NOT DETECTED Final  Culture, blood (routine x 2) Call MD if unable to obtain prior to antibiotics being given     Status: Abnormal   Collection Time: 11/15/17  9:12 PM  Result Value Ref Range Status   Specimen Description   Final    BLOOD LEFT HAND Performed at [place] [address], [place], [place] [address]    Special Requests   Final    BOTTLES DRAWN AEROBIC AND ANAEROBIC Blood Culture adequate volume Performed at [facility] [address], [place], [place] [address]    Culture  Setup Time   Final    GRAM NEGATIVE RODS IN BOTH AEROBIC AND ANAEROBIC BOTTLES CRITICAL VALUE NOTED.  VALUE IS CONSISTENT WITH PREVIOUSLY REPORTED AND CALLED VALUE.    Culture (A)  Final    KLEBSIELLA OXYTOCA SUSCEPTIBILITIES PERFORMED ON PREVIOUS CULTURE WITHIN THE LAST 5 DAYS. Performed at [facility], [place] [address] [place], [place] [address]    Report Status 11/18/2017 FINAL  Final  Culture, Urine     Status: None   Collection Time: 11/15/17  9:40 PM  Result Value Ref Range Status   Specimen Description    Final    URINE, RANDOM Performed at [facility] [address], [place], [place] [address]    Special Requests   Final    NONE Performed at [facility], [place] [address] [place], [place] [address]    Culture   Final    NO GROWTH Performed at [facility], [place] [address], [place], [place] [address]    Report Status 11/17/2017 FINAL  Final    Michel Bickers, MD [facility] [place] [facility] [phone] pager   [phone] cell 11/18/2017, 10:02 AM

## 2017-11-18 NOTE — Progress Notes (Signed)
PROGRESS NOTE  Oscar [name] IHK:[id number] DOB: [birth year] DOA: 11/06/2017 PCP: Deland Pretty, MD  HPI/Recap of past 36 hours: 84 year old male with past medical history significant for hypertension, esophageal cancer status post esophagectomy in remission, presents to the ER complaining of abdominal pain, nausea, vomiting.  Patient was noted to have SBO.  Patient noted to have refused surgical intervention earlier during the admission, but now is agreeable.  Surgery is on board and currently managing conservatively.  Overnight, no acute events. Pt saturating well on RA. No new complaints. Remains afebrile  Assessment/Plan: Principal Problem:   SBO (small bowel obstruction) (HCC) Active Problems:   HTN (hypertension)   OSA (obstructive sleep apnea)   Acute on chronic respiratory failure with hypoxia (HCC)  Sepsis with Klebsiella bacteremia Likely source due to aspiration pna due to SBO Currently afebrile with worsening leukocytosis BC growing klebsiella oxytoca sensitive to ceftriaxone, repeat pending UA positive for RBC, UC with no growth Procalcitonin 52.49-->43.56-->18.21 Repeat CXR with worsening multifocal pneumonia Continue ceftriaxone ID on board, appreciate recs, no need to remove PICC line and can continue TPN Monitor closely in ICU  Acute hypoxic respiratory distress Likely 2/2 aspiration pna due to SBO Currently on RA, saturating well, no increased WOB Repeat CXR showed worsening opacity Urine strep pneumo negative, legionella negative Continue ceftriaxone  Supplemental O2, incentive spirometry, duonebs  AKI Resolved Likely due to sepsis/bacteremia Daily BMP  Small bowel obstruction Improving Serial abdominal x-ray revealed persistent SBO, unable to tolerate NG tube General surgery on board, managing conservatively for now, continue TPN Pharmacy to dose TPN, replace electrolytes accordingly Advance to CLD  Electrolyte abnormalities Hypokalemia,  hypomagnesemia Replace as needed  Hypertension Continue IV hydralazine, Lopressor  OSA Continue CPAP  Hyperglycemia Likely due to TPN SSI, started lantus 10U BID   Code Status: Full  Family Communication: None at bedside  Disposition Plan: Once more stable   Consultants:  General surgery  ID  Procedures:  None  Antimicrobials:  IV Ceftriaxone  DVT prophylaxis: Enoxaparin   Objective: Vitals:   11/18/17 0747 11/18/17 0800 11/18/17 0825 11/18/17 1200  BP:  (!) 162/58 137/63   Pulse:      Resp:  (!) 28 (!) 34   Temp: 98.7 F (37.1 C)   98 F (36.7 C)  TempSrc: Oral   Oral  SpO2:  95% 95%   Weight:      Height:        Intake/Output Summary (Last 24 hours) at 11/18/2017 1526 Last data filed at 11/18/2017 1500 Gross per 24 hour  Intake 2883.43 ml  Output 1665 ml  Net 1218.43 ml   Filed Weights   11/16/17 0457 11/17/17 0500 11/18/17 0500  Weight: 89.9 kg (198 lb 3.1 oz) 83.8 kg (184 lb 11.9 oz) 89.2 kg (196 lb 10.4 oz)    Exam:   General: NAD  Cardiovascular: S1, S2 present  Respiratory: Bilateral crackles noted  Abdomen: Soft, nontender, nondistended, bowel sounds present  Musculoskeletal: No pedal edema bilaterally  Skin: Normal  Psychiatry: Normal mood   Data Reviewed: CBC: Recent Labs  Lab 11/11/17 1819  11/14/17 0544 11/15/17 0500 11/15/17 2121 11/17/17 0519 11/18/17 0544  WBC 9.0   < > 7.2 14.6* 16.5* 26.4* 26.6*  NEUTROABS 6.6  --   --  12.2* 14.6* 22.9 23.7*  HGB 13.3   < > 12.0* 10.4* 10.4* 9.7* 9.4*  HCT 39.7   < > 36.1* 30.4* 30.9* 28.4* 27.7*  MCV 96.6   < > 96.3 96.2  96.9 96.9 96.5  PLT 288   < > 342 304 241 234 265   < > = values in this interval not displayed.   Basic Metabolic Panel: Recent Labs  Lab 11/14/17 0544 11/15/17 0500 11/15/17 2121 11/16/17 0516 11/17/17 0519 11/18/17 0544  NA 141 141 142 140 141 139  K 3.4* 3.5 3.5 4.0 3.2* 3.6  CL 106 108 110 108 106 103  CO2 24 27 23 23 29 28     GLUCOSE 82 242* 188* 189* 248* 297*  BUN 15 15 20 22 19 16   CREATININE 0.80 0.89 1.35* 1.31* 1.00 0.98  CALCIUM 8.4* 7.9* 8.1* 7.9* 7.9* 8.0*  MG 1.8 1.6* 2.0 1.9 2.0 1.8  PHOS 2.9 1.8*  --  3.5 2.4* 2.3*   GFR: Estimated Creatinine Clearance: 67.6 mL/min (by C-G formula based on SCr of 0.98 mg/dL). Liver Function Tests: Recent Labs  Lab 11/14/17 0544 11/15/17 0500 11/15/17 2121 11/16/17 0516  AST 16 23 44* 36  ALT 14 19 29 27   ALKPHOS 38 40 70 57  BILITOT 1.9* 2.3* 2.8* 2.9*  PROT 6.0* 5.1* 5.3* 5.5*  ALBUMIN 3.1* 2.5* 2.5* 2.6*   No results for input(s): LIPASE, AMYLASE in the last 168 hours. No results for input(s): AMMONIA in the last 168 hours. Coagulation Profile: No results for input(s): INR, PROTIME in the last 168 hours. Cardiac Enzymes: No results for input(s): CKTOTAL, CKMB, CKMBINDEX, TROPONINI in the last 168 hours. BNP (last 3 results) No results for input(s): PROBNP in the last 8760 hours. HbA1C: No results for input(s): HGBA1C in the last 72 hours. CBG: Recent Labs  Lab 11/17/17 2351 11/18/17 0413 11/18/17 0604 11/18/17 0744 11/18/17 1158  GLUCAP 253* 284* 258* 213* 237*   Lipid Profile: No results for input(s): CHOL, HDL, LDLCALC, TRIG, CHOLHDL, LDLDIRECT in the last 72 hours. Thyroid Function Tests: No results for input(s): TSH, T4TOTAL, FREET4, T3FREE, THYROIDAB in the last 72 hours. Anemia Panel: No results for input(s): VITAMINB12, FOLATE, FERRITIN, TIBC, IRON, RETICCTPCT in the last 72 hours. Urine analysis:    Component Value Date/Time   COLORURINE AMBER (A) 11/15/2017 2140   APPEARANCEUR HAZY (A) 11/15/2017 2140   LABSPEC 1.011 11/15/2017 2140   PHURINE 5.0 11/15/2017 2140   GLUCOSEU NEGATIVE 11/15/2017 2140   HGBUR LARGE (A) 11/15/2017 2140   BILIRUBINUR NEGATIVE 11/15/2017 2140   KETONESUR NEGATIVE 11/15/2017 2140   PROTEINUR NEGATIVE 11/15/2017 2140   UROBILINOGEN 0.2 09/12/2014 1816   NITRITE NEGATIVE 11/15/2017 2140    LEUKOCYTESUR NEGATIVE 11/15/2017 2140   Sepsis Labs: @LABRCNTIP (procalcitonin:4,lacticidven:4)  ) Recent Results (from the past 240 hour(s))  MRSA PCR Screening     Status: None   Collection Time: 11/15/17  7:54 PM  Result Value Ref Range Status   MRSA by PCR NEGATIVE NEGATIVE Final    Comment:        The GeneXpert MRSA Assay (FDA approved for NASAL specimens only), is one component of a comprehensive MRSA colonization surveillance program. It is not intended to diagnose MRSA infection nor to guide or monitor treatment for MRSA infections. Performed at [facility], [place] [address], [place], [place] [address]   Culture, blood (routine x 2) Call MD if unable to obtain prior to antibiotics being given     Status: Abnormal   Collection Time: 11/15/17  9:01 PM  Result Value Ref Range Status   Specimen Description   Final    BLOOD LEFT ANTECUBITAL Performed at [facility]  [address], [place], [place] [address]    Special Requests   Final    BOTTLES DRAWN AEROBIC AND ANAEROBIC Blood Culture adequate volume Performed at [facility] [address], [place], [place] [address]    Culture  Setup Time   Final    GRAM NEGATIVE RODS IN BOTH AEROBIC AND ANAEROBIC BOTTLES CRITICAL RESULT CALLED TO, READ BACK BY AND VERIFIED WITH: [place] Slider PharmD 14:15 11/16/17 ([email or web address]) Performed at [facility], [place] [address], [place], [place] [address]    Culture KLEBSIELLA OXYTOCA (A)  Final   Report Status 11/18/2017 FINAL  Final   Organism ID, Bacteria KLEBSIELLA OXYTOCA  Final      Susceptibility   Klebsiella oxytoca - MIC*    AMPICILLIN >=32 RESISTANT Resistant     CEFAZOLIN <=4 SENSITIVE Sensitive     CEFEPIME <=1 SENSITIVE Sensitive     CEFTAZIDIME <=1 SENSITIVE Sensitive     CEFTRIAXONE <=1 SENSITIVE Sensitive     CIPROFLOXACIN <=0.25 SENSITIVE Sensitive     GENTAMICIN <=1 SENSITIVE Sensitive     IMIPENEM <=0.25  SENSITIVE Sensitive     TRIMETH/SULFA <=20 SENSITIVE Sensitive     AMPICILLIN/SULBACTAM 4 SENSITIVE Sensitive     PIP/TAZO <=4 SENSITIVE Sensitive     * KLEBSIELLA OXYTOCA  Blood Culture ID Panel (Reflexed)     Status: Abnormal   Collection Time: 11/15/17  9:01 PM  Result Value Ref Range Status   Enterococcus species NOT DETECTED NOT DETECTED Final   Listeria monocytogenes NOT DETECTED NOT DETECTED Final   Staphylococcus species NOT DETECTED NOT DETECTED Final   Staphylococcus aureus NOT DETECTED NOT DETECTED Final   Streptococcus species NOT DETECTED NOT DETECTED Final   Streptococcus agalactiae NOT DETECTED NOT DETECTED Final   Streptococcus pneumoniae NOT DETECTED NOT DETECTED Final   Streptococcus pyogenes NOT DETECTED NOT DETECTED Final   Acinetobacter baumannii NOT DETECTED NOT DETECTED Final   Enterobacteriaceae species DETECTED (A) NOT DETECTED Final    Comment: Enterobacteriaceae represent a large family of gram-negative bacteria, not a single organism. CRITICAL RESULT CALLED TO, READ BACK BY AND VERIFIED WITH: [place] Slider PharmD 14:15 11/16/17 ([email or web address])    Enterobacter cloacae complex NOT DETECTED NOT DETECTED Final   Escherichia coli NOT DETECTED NOT DETECTED Final   Klebsiella oxytoca DETECTED (A) NOT DETECTED Final    Comment: CRITICAL RESULT CALLED TO, READ BACK BY AND VERIFIED WITH: Sheffield Slider PharmD 14:15 11/16/17 ([email or web address])    Klebsiella pneumoniae NOT DETECTED NOT DETECTED Final   Proteus species NOT DETECTED NOT DETECTED Final   Serratia marcescens NOT DETECTED NOT DETECTED Final   Carbapenem resistance NOT DETECTED NOT DETECTED Final   Haemophilus influenzae NOT DETECTED NOT DETECTED Final   Neisseria meningitidis NOT DETECTED NOT DETECTED Final   Pseudomonas aeruginosa NOT DETECTED NOT DETECTED Final   Candida albicans NOT DETECTED NOT DETECTED Final   Candida glabrata NOT DETECTED NOT DETECTED Final   Candida krusei NOT DETECTED NOT DETECTED Final   Candida  parapsilosis NOT DETECTED NOT DETECTED Final   Candida tropicalis NOT DETECTED NOT DETECTED Final  Culture, blood (routine x 2) Call MD if unable to obtain prior to antibiotics being given     Status: Abnormal   Collection Time: 11/15/17  9:12 PM  Result Value Ref Range Status   Specimen Description   Final    BLOOD LEFT HAND Performed at [facility], [place] [address], [place], [place] [address]    Special Requests   Final  BOTTLES DRAWN AEROBIC AND ANAEROBIC Blood Culture adequate volume Performed at [place] [address], [place], [place] [address]    Culture  Setup Time   Final    GRAM NEGATIVE RODS IN BOTH AEROBIC AND ANAEROBIC BOTTLES CRITICAL VALUE NOTED.  VALUE IS CONSISTENT WITH PREVIOUSLY REPORTED AND CALLED VALUE.    Culture (A)  Final    KLEBSIELLA OXYTOCA SUSCEPTIBILITIES PERFORMED ON PREVIOUS CULTURE WITHIN THE LAST 5 DAYS. Performed at [facility], [place] [address], [place], [place] [address]    Report Status 11/18/2017 FINAL  Final  Culture, Urine     Status: None   Collection Time: 11/15/17  9:40 PM  Result Value Ref Range Status   Specimen Description   Final    URINE, RANDOM Performed at [facility] [address] [place], [place] [address]    Special Requests   Final    NONE Performed at [facility], [place] [address], [place], [place] [address]    Culture   Final    NO GROWTH Performed at [facility], [place] [address] [place], [place] [address]    Report Status 11/17/2017 FINAL  Final      Studies: No results found.  Scheduled Meds: . Chlorhexidine Gluconate Cloth  6 each Topical Daily  . enoxaparin (LOVENOX) injection  40 mg Subcutaneous Daily  . insulin aspart  0-20 Units Subcutaneous Q6H  . insulin glargine  10 Units Subcutaneous QHS  . ipratropium-albuterol  3 mL Nebulization BID  . mouth rinse  15 mL Mouth Rinse BID  . metoprolol tartrate  10 mg  Intravenous Q6H    Continuous Infusions: . [name] KitchenTPN (CLINIMIX-E) Adult 70 mL/hr at 11/17/17 1705  . [name] KitchenTPN (CLINIMIX-E) Adult     And  . Fat emulsion    . cefTRIAXone (ROCEPHIN)  IV Stopped (11/17/17 1915)  . potassium PHOSPHATE IVPB (in mmol) 10 mmol (11/18/17 1427)     LOS: 12 days     Alma Friendly, MD [facility]   If 7PM-7AM, please contact night-coverage [email or web address] Password [facility] 11/18/2017, 3:26 PM

## 2017-11-18 NOTE — Significant Event (Signed)
Rapid Response Event Note  Overview: Time Called: 1900 Arrival Time: 1900 Event Type: Cardiac  Initial Focused Assessment:  Pateint sitting in chair shaking in a rigors like fashion. BP elevated as follows, heart rate sustaining in 130-140s range, and respiratory rate in 40s. Initial temp 98.8. MD stated to call rapid response. Secondary temp 101.9 oral, and axillary 103.9 but patient under multiple blankets with heat packs due to stating he was cold. Blankets removed and rectal tylenol given for fever. Called midlevel for insight. NP stated to give 2 of Morphine and call back if unresolved in 20 minutes. Post Morphine and Tylenol patient improving slowly.  Blood Pressures:   218/84 244/228 206/11  Interventions:  MD communication, medication administration      [name] A Leighton Brickley

## 2017-11-18 NOTE — Progress Notes (Signed)
Pt. Does not want to wear CPAP tonight, states that he does not tolerate it.  Will call if he changes his mind.

## 2017-11-18 NOTE — Progress Notes (Signed)
Patient began uncontrollable shaking with HR sustaining in the 110's and elevated BP 160/117 oral temperature of 98.  Pt. Complained of being cold repeatedly saying "Im freezing.  Warm blankets applied to patient with no relief.  Temperature increased to 101.7 with HR sustaining in 140's and BP 224/228 with worsening shaking.  RN administered Tylenol suppository and removed blankets.  MD text paged and E link contacted.  RN received orders to administer morphine and utilize cooling blanket if Tylenol not effective.  RN will continue to monitor.

## 2017-11-18 NOTE — Progress Notes (Signed)
eLink Physician-Brief Progress Note Patient Name: Oscar [name] DOB: 11/14/1932 MRN: [id number]   Date of Service  11/18/2017  HPI/Events of Note  Patient spiked temp to 101.7 F acutely and is having rigors. Nurse is going to give the ordered Tylenol. The patient is already on Rocephin and is followed by ID. Patient is currently not a PCCM patient.   eICU Interventions  Will order: 1. If Tylenol not effective in reducing temperature, apply cooling blanket.  2. Further management per TRH who are the primary service.      Intervention Category Major Interventions: Infection - evaluation and management  Sommer,Steven Eugene 11/18/2017, 7:15 PM

## 2017-11-18 NOTE — Progress Notes (Signed)
Subjective/Chief Complaint: Hungry, having bowel function, nl xray yesterday   Objective: Vital signs in last 24 hours: Temp:  [98.3 F (36.8 C)-99.9 F (37.7 C)] 98.7 F (37.1 C) (07/13 0747) Resp:  [17-34] 21 (07/13 0742) BP: (138-168)/(55-67) 162/67 (07/13 0742) SpO2:  [86 %-98 %] 88 % (07/13 0742) FiO2 (%):  [21 %] 21 % (07/12 1936) Weight:  [89.2 kg (196 lb 10.4 oz)] 89.2 kg (196 lb 10.4 oz) (07/13 0500) Last BM Date: 11/17/17  Intake/Output from previous day: 07/12 0701 - 07/13 0700 In: 2744.8 [P.O.:240; I.V.:1989.5; IV Piggyback:515.3] Out: 1600 [Urine:1525; Stool:75] Intake/Output this shift: Total I/O In: 0  Out: 290 [Urine:290]  GI: soft nontender nondistended  Lab Results:  Recent Labs    11/17/17 0519 11/18/17 0544  WBC 26.4* 26.6*  HGB 9.7* 9.4*  HCT 28.4* 27.7*  PLT 234 265   BMET Recent Labs    11/17/17 0519 11/18/17 0544  NA 141 139  K 3.2* 3.6  CL 106 103  CO2 29 28  GLUCOSE 248* 297*  BUN 19 16  CREATININE 1.00 0.98  CALCIUM 7.9* 8.0*   PT/INR No results for input(s): LABPROT, INR in the last 72 hours. ABG Recent Labs    11/15/17 1930  PHART 7.465*  HCO3 20.1    Studies/Results: Dg Chest Port 1 View  Result Date: 11/17/2017 CLINICAL DATA:  Pneumonia EXAM: PORTABLE CHEST 1 VIEW COMPARISON:  Portable exam 0418 hours compared to 11/15/2017 FINDINGS: Rotated to the LEFT. RIGHT arm PICC line tip projects over SVC. Enlargement of cardiac silhouette. Atherosclerotic calcification aorta. Patchy BILATERAL pulmonary infiltrates consistent with pneumonia, greatest in RIGHT upper lobe. RIGHT lung infiltrates have increased since previous exam particularly in the lower RIGHT lung. No pleural effusion or pneumothorax. Hiatal hernia. Osseous demineralization. IMPRESSION: Increased pulmonary infiltrates since 11/15/2017 consistent with multifocal pneumonia. Enlargement of cardiac silhouette. Electronically Signed   By: Lavonia Dana M.D.   On:  11/17/2017 08:33   Dg Chest Port 1 View  Result Date: 11/16/2017 CLINICAL DATA:  Cough. EXAM: PORTABLE CHEST 1 VIEW COMPARISON:  Radiograph of November 15, 2017. FINDINGS: Stable cardiomegaly. Atherosclerosis of thoracic aorta is noted. Right-sided PICC line is unchanged in position. No pneumothorax is noted. No significant pleural effusion is noted. Increased right upper lobe airspace opacity is noted concerning for pneumonia. Stable bibasilar subsegmental atelectasis. IMPRESSION: Increased right upper lobe airspace opacity is noted concerning for pneumonia. Aortic Atherosclerosis (ICD10-I70.0). Electronically Signed   By: Marijo Conception, M.D.   On: 11/16/2017 13:30   Dg Abd Portable 1v  Result Date: 11/17/2017 CLINICAL DATA:  Abdominal pain and distention. EXAM: PORTABLE ABDOMEN - 1 VIEW COMPARISON:  Radiographs of November 16, 2017. FINDINGS: No small bowel dilatation is noted currently, which is significantly improved compared to prior exam. Residual contrast is noted within small bowel and colon. Phleboliths are noted in the pelvis. IMPRESSION: No bowel dilatation is noted currently. Electronically Signed   By: Marijo Conception, M.D.   On: 11/17/2017 08:57   Dg Abd Portable 1v  Result Date: 11/16/2017 CLINICAL DATA:  Small bowel obstruction. EXAM: PORTABLE ABDOMEN - 1 VIEW COMPARISON:  Radiographs of November 15, 2017. FINDINGS: Continued small bowel dilatation is noted. Residual contrast remains within small bowel loops as well as: Consistent with partial small bowel obstruction. Surgical clips are noted in the left upper quadrant. IMPRESSION: Findings consistent with partial small bowel obstruction. Electronically Signed   By: Marijo Conception, M.D.   On: 11/16/2017 13:29  Anti-infectives: Anti-infectives (From admission, onward)   Start     Dose/Rate Route Frequency Ordered Stop   11/18/17 0800  vancomycin (VANCOCIN) IVPB 750 mg/150 ml premix  Status:  Discontinued     750 mg 150 mL/hr over 60  Minutes Intravenous Every 12 hours 11/17/17 1642 11/17/17 1805   11/17/17 1830  cefTRIAXone (ROCEPHIN) 2 g in sodium chloride 0.9 % 100 mL IVPB     2 g 200 mL/hr over 30 Minutes Intravenous Every 24 hours 11/17/17 1805     11/17/17 1800  vancomycin (VANCOCIN) 1,500 mg in sodium chloride 0.9 % 500 mL IVPB  Status:  Discontinued     1,500 mg 250 mL/hr over 120 Minutes Intravenous  Once 11/17/17 1642 11/17/17 1805   11/17/17 1800  meropenem (MERREM) 1 g in sodium chloride 0.9 % 100 mL IVPB  Status:  Discontinued     1 g 200 mL/hr over 30 Minutes Intravenous Every 8 hours 11/17/17 1647 11/17/17 1805   11/16/17 2200  cefTRIAXone (ROCEPHIN) 2 g in sodium chloride 0.9 % 100 mL IVPB  Status:  Discontinued     2 g 200 mL/hr over 30 Minutes Intravenous Every 24 hours 11/16/17 1447 11/17/17 1609   11/16/17 2100  vancomycin (VANCOCIN) 1,500 mg in sodium chloride 0.9 % 500 mL IVPB  Status:  Discontinued     1,500 mg 250 mL/hr over 120 Minutes Intravenous Every 24 hours 11/15/17 1951 11/16/17 1131   11/15/17 2200  piperacillin-tazobactam (ZOSYN) IVPB 3.375 g  Status:  Discontinued     3.375 g 12.5 mL/hr over 240 Minutes Intravenous Every 8 hours 11/15/17 2137 11/16/17 1447   11/15/17 2030  vancomycin (VANCOCIN) 1,750 mg in sodium chloride 0.9 % 500 mL IVPB     1,750 mg 250 mL/hr over 120 Minutes Intravenous  Once 11/15/17 1948 11/15/17 2330   11/15/17 2000  ceFEPIme (MAXIPIME) 1 g in sodium chloride 0.9 % 100 mL IVPB  Status:  Discontinued     1 g 200 mL/hr over 30 Minutes Intravenous Every 8 hours 11/15/17 1932 11/15/17 2132   11/08/17 1200  fluconazole (DIFLUCAN) IVPB 100 mg  Status:  Discontinued     100 mg 50 mL/hr over 60 Minutes Intravenous Every 24 hours 11/08/17 1032 11/10/17 0934      Assessment/Plan: SBO- admitted 11/06/17 - this has resolved clinically and radiologically at this point -he can have diet advanced per medicine this is dependent on his asp pna -will see again Monday am  unless change occurs please call Aspiration PNA- per medicine and id Leukocytosis - WBC 26.6, likely related to PNA; afebrile Malnutritionrelated to chronic illness- Prealbumin 6.1, TPN started yesterday     Rolm Bookbinder 11/18/2017

## 2017-11-18 NOTE — Progress Notes (Signed)
PHARMACY - ADULT TOTAL PARENTERAL NUTRITION CONSULT NOTE   Pharmacy Consult for TPN Indication: bowel obstruction  Patient Measurements: Height: '6\' 2"'  (188 cm) Weight: 196 lb 10.4 oz (89.2 kg) IBW/kg (Calculated) : 82.2 TPN AdjBW (KG): 88.9 Body mass index is 25.25 kg/m.  Insulin Requirements: 40 units via sliding scale/24 hours  Current Nutrition: NPO except for ice chips  IVF: IVF d/c (added Lasix 44m IV q12h)  Central access: PICC placed 7/9 TPN start date: 7/9  ASSESSMENT                                                                                                          HPI:  [id number] with PMH of HTN, remote esophageal cancer in remission, OSA, DM controlled with diet and exercise [facility] with abdominal pain, n/v and found to have small bowel obstruction. Abdominal film today c/w ongoing partial small bowel obstruction. Pharmacy consulted for TPN.   Significant events:  7/9-7/12 Reglan 5 mg q6h 7/10 Aspirated overnight, Vomit reportedly feculent smelling/appearing.  Transferred to ICU.  Today:   Glucose - CBGs 213-297 (Goal < 150), lantus 10 units qHS added 7/12 per MD  Electrolytes - Mg 1.8 and Phos 2.3 are low, others Na, Cl, CorrCa, K are WNL.  Renal - SCr 1 (stable)  LFTs - AST/ALT, Alk Phos WNL. Tbili elevated/increased to 2.9 on 7/11  TGs - 102 (7/10)  Prealbumin - 6.1 (7/9)  NUTRITIONAL GOALS                                                                                             RD recs (7/9): Kcal:  2400-2600 kcal , Protein:  120-130 g  Clinimix 5/20 at a goal rate of 1027mhr + 20% fat emulsion at 2034mr x 12h to provide:  120 g/day protein, 2592 kcal/day.  PLAN                                                                                                                          Now:  Potassium phosphate 10 mmol IV once  Mg 2 g IV once  At 1800 today:  Continue Clinimix E 5/20 at 70 mL/hr - will hold off on  advancing rate given low Phos.  Continue 20% fat emulsion at 34m/hr x 12h.  Plan to advance as tolerated to the goal rate on electrolytes normalize  TPN to contain standard multivitamins and trace elements. Monitor Tbili closely - will recheck with AM labs tomorrow. Pt is not jaundiced, will continue in TPN today.  Continue famotidine 475min TPN bag.    BG elevated above goal but lantus 10 units SQ qHS added on 7/12 per MD. Will continue resistant SSI and monitor BG over next 24 hours. If remain elevated, will consider adding insulin to TPN.  IVF per MD  TPN lab panels in AM and on Mondays & Thursdays.  F/u daily.  BMET, Mag, Phos ordered daily Friday-Sunday.  MaTheodis ShovePharmD, BCPS Clinical Pharmacist Pager: [phone]/13/19 11:43 AM

## 2017-11-19 DIAGNOSIS — R509 Fever, unspecified: Secondary | ICD-10-CM

## 2017-11-19 LAB — CBC WITH DIFFERENTIAL/PLATELET
BASOS ABS: 0 10*3/uL (ref 0.0–0.1)
BASOS PCT: 0 %
EOS PCT: 1 %
Eosinophils Absolute: 0.2 10*3/uL (ref 0.0–0.7)
HEMATOCRIT: 27.3 % — AB (ref 39.0–52.0)
Hemoglobin: 9.3 g/dL — ABNORMAL LOW (ref 13.0–17.0)
LYMPHS ABS: 1.4 10*3/uL (ref 0.7–4.0)
Lymphocytes Relative: 6 %
MCH: 32.6 pg (ref 26.0–34.0)
MCHC: 34.1 g/dL (ref 30.0–36.0)
MCV: 95.8 fL (ref 78.0–100.0)
MONOS PCT: 8 %
Monocytes Absolute: 1.9 10*3/uL — ABNORMAL HIGH (ref 0.1–1.0)
NEUTROS ABS: 19.7 10*3/uL — AB (ref 1.7–7.7)
Neutrophils Relative %: 85 %
Platelets: 302 10*3/uL (ref 150–400)
RBC: 2.85 MIL/uL — ABNORMAL LOW (ref 4.22–5.81)
RDW: 13.3 % (ref 11.5–15.5)
WBC: 23.2 10*3/uL — ABNORMAL HIGH (ref 4.0–10.5)

## 2017-11-19 LAB — GLUCOSE, CAPILLARY
GLUCOSE-CAPILLARY: 210 mg/dL — AB (ref 70–99)
GLUCOSE-CAPILLARY: 266 mg/dL — AB (ref 70–99)
Glucose-Capillary: 192 mg/dL — ABNORMAL HIGH (ref 70–99)
Glucose-Capillary: 243 mg/dL — ABNORMAL HIGH (ref 70–99)
Glucose-Capillary: 224 mg/dL — ABNORMAL HIGH (ref 70–99)

## 2017-11-19 LAB — COMPREHENSIVE METABOLIC PANEL
ALK PHOS: 76 U/L (ref 38–126)
ALT: 33 U/L (ref 0–44)
AST: 43 U/L — AB (ref 15–41)
Albumin: 2.2 g/dL — ABNORMAL LOW (ref 3.5–5.0)
Anion gap: 8 (ref 5–15)
BILIRUBIN TOTAL: 1.1 mg/dL (ref 0.3–1.2)
BUN: 16 mg/dL (ref 8–23)
CALCIUM: 7.9 mg/dL — AB (ref 8.9–10.3)
CO2: 27 mmol/L (ref 22–32)
CREATININE: 0.88 mg/dL (ref 0.61–1.24)
Chloride: 101 mmol/L (ref 98–111)
Glucose, Bld: 241 mg/dL — ABNORMAL HIGH (ref 70–99)
Potassium: 3.3 mmol/L — ABNORMAL LOW (ref 3.5–5.1)
Sodium: 136 mmol/L (ref 135–145)
Total Protein: 5.4 g/dL — ABNORMAL LOW (ref 6.5–8.1)

## 2017-11-19 LAB — MAGNESIUM: Magnesium: 2 mg/dL (ref 1.7–2.4)

## 2017-11-19 LAB — PHOSPHORUS: Phosphorus: 3 mg/dL (ref 2.5–4.6)

## 2017-11-19 MED ORDER — INSULIN ASPART 100 UNIT/ML ~~LOC~~ SOLN
0.0000 [IU] | Freq: Four times a day (QID) | SUBCUTANEOUS | Status: DC
  Administered 2017-11-19: 5 [IU] via SUBCUTANEOUS
  Administered 2017-11-19: 3 [IU] via SUBCUTANEOUS
  Administered 2017-11-20: 5 [IU] via SUBCUTANEOUS
  Administered 2017-11-20: 8 [IU] via SUBCUTANEOUS

## 2017-11-19 MED ORDER — HYDROCHLOROTHIAZIDE 10 MG/ML ORAL SUSPENSION
6.2500 mg | Freq: Every day | ORAL | Status: DC
  Administered 2017-11-19 – 2017-11-20 (×2): 6.25 mg via ORAL
  Filled 2017-11-19 (×6): qty 1.25

## 2017-11-19 MED ORDER — LOSARTAN POTASSIUM-HCTZ 100-12.5 MG PO TABS: 0.5000 | ORAL_TABLET | Freq: Every day | ORAL | Status: DC

## 2017-11-19 MED ORDER — LOSARTAN POTASSIUM 50 MG PO TABS
50.0000 mg | ORAL_TABLET | Freq: Every day | ORAL | Status: DC
  Administered 2017-11-19 – 2017-11-20 (×2): 50 mg via ORAL
  Filled 2017-11-19 (×2): qty 1

## 2017-11-19 MED ORDER — FAT EMULSION PLANT BASED 20 % IV EMUL
240.0000 mL | INTRAVENOUS | Status: AC
  Administered 2017-11-19: 240 mL via INTRAVENOUS
  Filled 2017-11-19: qty 250

## 2017-11-19 MED ORDER — M.V.I. ADULT IV INJ
INJECTION | INTRAVENOUS | Status: AC
  Administered 2017-11-19: 17:00:00 via INTRAVENOUS
  Filled 2017-11-19 (×2): qty 1680

## 2017-11-19 MED ORDER — POTASSIUM CHLORIDE 10 MEQ/50ML IV SOLN
10.0000 meq | INTRAVENOUS | Status: AC
  Administered 2017-11-19 (×4): 10 meq via INTRAVENOUS
  Filled 2017-11-19 (×4): qty 50

## 2017-11-19 NOTE — Progress Notes (Signed)
PROGRESS NOTE  SIAOSI ALTER TML:[id number] DOB: 1933-03-14 DOA: 11/06/2017 PCP: Deland Pretty, MD  HPI/Recap of past 27 hours: 84 year old male with past medical history significant for hypertension, esophageal cancer status post esophagectomy in remission, presents to the ER complaining of abdominal pain, nausea, vomiting.  Patient was noted to have SBO.  Patient noted to have refused surgical intervention earlier during the admission, but now is agreeable.  Surgery is on board and currently managing conservatively.  Overnight, pt noted to have severe chills/rigors and spiked a temp which resolved after tylenol and morphine. Pt still saturating well on RA. No further fever/chills today. Denies any worsening cough, abdominal pain, dysuria.  Assessment/Plan: Principal Problem:   SBO (small bowel obstruction) (HCC) Active Problems:   HTN (hypertension)   OSA (obstructive sleep apnea)   Acute on chronic respiratory failure with hypoxia (HCC)  Sepsis with Klebsiella bacteremia Likely source due to aspiration pna due to SBO Temp spike on 11/18/17, with slowly resolving leukocytosis BC growing klebsiella oxytoca sensitive to ceftriaxone, repeat BC X 2 NGTD UA positive for RBC, UC with no growth Procalcitonin 52.49-->43.56-->18.21 Repeat CXR with worsening multifocal pneumonia, will repeat in am Continue ceftriaxone ID on board, appreciate recs, no need to remove PICC line and can continue TPN, if another temp spike, will remove PICC and stop TPN. Awaiting SLP for swallow study so as to advance diet Monitor closely in ICU  Acute hypoxic respiratory distress Likely 2/2 aspiration pna due to SBO Currently on RA, saturating well, no increased WOB Repeat CXR showed worsening opacity, will repeat Urine strep pneumo negative, legionella negative Continue ceftriaxone  Supplemental O2, incentive spirometry, duonebs  AKI Resolved Likely due to sepsis/bacteremia Daily BMP  Small bowel  obstruction Resolved as per surgery Serial abdominal x-ray revealed persistent SBO, unable to tolerate NG tube General surgery on board, managing conservatively for now, continue TPN Pharmacy to dose TPN, replace electrolytes accordingly Advance diet after SLP, and plan to d/c TPN  Electrolyte abnormalities Hypokalemia, hypomagnesemia Replace as needed  Hypertension Continue IV hydralazine prn, Lopressor, losartan/HCT  OSA Continue CPAP  Hyperglycemia Likely due to TPN SSI, lantus 10U BID   Code Status: Full  Family Communication: None at bedside  Disposition Plan: Once more stable   Consultants:  General surgery  ID  Procedures:  None  Antimicrobials:  IV Ceftriaxone  DVT prophylaxis: Enoxaparin   Objective: Vitals:   11/19/17 1419 11/19/17 1430 11/19/17 1539 11/19/17 1550  BP: (!) 192/62 (!) 173/73 (!) 210/59 (!) 185/62  Pulse:      Resp: (!) 21 (!) 26 19 (!) 32  Temp:    99.5 F (37.5 C)  TempSrc:    Axillary  SpO2: 99% 98% 98% 98%  Weight:      Height:        Intake/Output Summary (Last 24 hours) at 11/19/2017 1617 Last data filed at 11/19/2017 1431 Gross per 24 hour  Intake 2082.33 ml  Output 551 ml  Net 1531.33 ml   Filed Weights   11/17/17 0500 11/18/17 0500 11/19/17 0610  Weight: 83.8 kg (184 lb 11.9 oz) 89.2 kg (196 lb 10.4 oz) 88.2 kg (194 lb 7.1 oz)    Exam:   General: NAD  Cardiovascular: S1, S2 present  Respiratory: Bilateral crackles noted  Abdomen: Soft, nontender, nondistended, bowel sounds present  Musculoskeletal: No pedal edema bilaterally  Skin: Normal  Psychiatry: Normal mood   Data Reviewed: CBC: Recent Labs  Lab 11/15/17 0500 11/15/17 2121 11/17/17 0519 11/18/17 0544  11/19/17 0442  WBC 14.6* 16.5* 26.4* 26.6* 23.2*  NEUTROABS 12.2* 14.6* 22.9 23.7* 19.7*  HGB 10.4* 10.4* 9.7* 9.4* 9.3*  HCT 30.4* 30.9* 28.4* 27.7* 27.3*  MCV 96.2 96.9 96.9 96.5 95.8  PLT 304 241 234 265 025   Basic  Metabolic Panel: Recent Labs  Lab 11/15/17 0500 11/15/17 2121 11/16/17 0516 11/17/17 0519 11/18/17 0544 11/19/17 0442  NA 141 142 140 141 139 136  K 3.5 3.5 4.0 3.2* 3.6 3.3*  CL 108 110 108 106 103 101  CO2 27 23 23 29 28 27   GLUCOSE 242* 188* 189* 248* 297* 241*  BUN 15 20 22 19 16 16   CREATININE 0.89 1.35* 1.31* 1.00 0.98 0.88  CALCIUM 7.9* 8.1* 7.9* 7.9* 8.0* 7.9*  MG 1.6* 2.0 1.9 2.0 1.8 2.0  PHOS 1.8*  --  3.5 2.4* 2.3* 3.0   GFR: Estimated Creatinine Clearance: 75.2 mL/min (by C-G formula based on SCr of 0.88 mg/dL). Liver Function Tests: Recent Labs  Lab 11/14/17 0544 11/15/17 0500 11/15/17 2121 11/16/17 0516 11/19/17 0442  AST 16 23 44* 36 43*  ALT 14 19 29 27  33  ALKPHOS 38 40 70 57 76  BILITOT 1.9* 2.3* 2.8* 2.9* 1.1  PROT 6.0* 5.1* 5.3* 5.5* 5.4*  ALBUMIN 3.1* 2.5* 2.5* 2.6* 2.2*   No results for input(s): LIPASE, AMYLASE in the last 168 hours. No results for input(s): AMMONIA in the last 168 hours. Coagulation Profile: No results for input(s): INR, PROTIME in the last 168 hours. Cardiac Enzymes: No results for input(s): CKTOTAL, CKMB, CKMBINDEX, TROPONINI in the last 168 hours. BNP (last 3 results) No results for input(s): PROBNP in the last 8760 hours. HbA1C: No results for input(s): HGBA1C in the last 72 hours. CBG: Recent Labs  Lab 11/18/17 1158 11/18/17 1712 11/18/17 2338 11/19/17 0613 11/19/17 1146  GLUCAP 237* 286* 230* 210* 192*   Lipid Profile: No results for input(s): CHOL, HDL, LDLCALC, TRIG, CHOLHDL, LDLDIRECT in the last 72 hours. Thyroid Function Tests: No results for input(s): TSH, T4TOTAL, FREET4, T3FREE, THYROIDAB in the last 72 hours. Anemia Panel: No results for input(s): VITAMINB12, FOLATE, FERRITIN, TIBC, IRON, RETICCTPCT in the last 72 hours. Urine analysis:    Component Value Date/Time   COLORURINE AMBER (A) 11/15/2017 2140   APPEARANCEUR HAZY (A) 11/15/2017 2140   LABSPEC 1.011 11/15/2017 2140   PHURINE 5.0  11/15/2017 2140   GLUCOSEU NEGATIVE 11/15/2017 2140   HGBUR LARGE (A) 11/15/2017 2140   BILIRUBINUR NEGATIVE 11/15/2017 2140   KETONESUR NEGATIVE 11/15/2017 2140   PROTEINUR NEGATIVE 11/15/2017 2140   UROBILINOGEN 0.2 09/12/2014 1816   NITRITE NEGATIVE 11/15/2017 2140   LEUKOCYTESUR NEGATIVE 11/15/2017 2140   Sepsis Labs: @LABRCNTIP (procalcitonin:4,lacticidven:4)  ) Recent Results (from the past 240 hour(s))  MRSA PCR Screening     Status: None   Collection Time: 11/15/17  7:54 PM  Result Value Ref Range Status   MRSA by PCR NEGATIVE NEGATIVE Final    Comment:        The GeneXpert MRSA Assay (FDA approved for NASAL specimens only), is one component of a comprehensive MRSA colonization surveillance program. It is not intended to diagnose MRSA infection nor to guide or monitor treatment for MRSA infections. Performed at [facility], [place] [address] [place], [place] [address]   Culture, blood (routine x 2) Call MD if unable to obtain prior to antibiotics being given     Status: Abnormal   Collection Time: 11/15/17  9:01 PM  Result  Value Ref Range Status   Specimen Description   Final    BLOOD LEFT ANTECUBITAL Performed at [facility] [address], [place], [place] [address]    Special Requests   Final    BOTTLES DRAWN AEROBIC AND ANAEROBIC Blood Culture adequate volume Performed at [facility] [address], [place], [place] [address]    Culture  Setup Time   Final    GRAM NEGATIVE RODS IN BOTH AEROBIC AND ANAEROBIC BOTTLES CRITICAL RESULT CALLED TO, READ BACK BY AND VERIFIED WITH: Sheffield Slider PharmD 14:15 11/16/17 ([email or web address]) Performed at [facility], [place] [address], [place], [place] [address]    Culture KLEBSIELLA OXYTOCA (A)  Final   Report Status 11/18/2017 FINAL  Final   Organism ID, Bacteria KLEBSIELLA OXYTOCA  Final      Susceptibility   Klebsiella oxytoca - MIC*    AMPICILLIN >=32 RESISTANT  Resistant     CEFAZOLIN <=4 SENSITIVE Sensitive     CEFEPIME <=1 SENSITIVE Sensitive     CEFTAZIDIME <=1 SENSITIVE Sensitive     CEFTRIAXONE <=1 SENSITIVE Sensitive     CIPROFLOXACIN <=0.25 SENSITIVE Sensitive     GENTAMICIN <=1 SENSITIVE Sensitive     IMIPENEM <=0.25 SENSITIVE Sensitive     TRIMETH/SULFA <=20 SENSITIVE Sensitive     AMPICILLIN/SULBACTAM 4 SENSITIVE Sensitive     PIP/TAZO <=4 SENSITIVE Sensitive     * KLEBSIELLA OXYTOCA  Blood Culture ID Panel (Reflexed)     Status: Abnormal   Collection Time: 11/15/17  9:01 PM  Result Value Ref Range Status   Enterococcus species NOT DETECTED NOT DETECTED Final   Listeria monocytogenes NOT DETECTED NOT DETECTED Final   Staphylococcus species NOT DETECTED NOT DETECTED Final   Staphylococcus aureus NOT DETECTED NOT DETECTED Final   Streptococcus species NOT DETECTED NOT DETECTED Final   Streptococcus agalactiae NOT DETECTED NOT DETECTED Final   Streptococcus pneumoniae NOT DETECTED NOT DETECTED Final   Streptococcus pyogenes NOT DETECTED NOT DETECTED Final   Acinetobacter baumannii NOT DETECTED NOT DETECTED Final   Enterobacteriaceae species DETECTED (A) NOT DETECTED Final    Comment: Enterobacteriaceae represent a large family of gram-negative bacteria, not a single organism. CRITICAL RESULT CALLED TO, READ BACK BY AND VERIFIED WITH: Sheffield Slider PharmD 14:15 11/16/17 ([email or web address])    Enterobacter cloacae complex NOT DETECTED NOT DETECTED Final   Escherichia coli NOT DETECTED NOT DETECTED Final   Klebsiella oxytoca DETECTED (A) NOT DETECTED Final    Comment: CRITICAL RESULT CALLED TO, READ BACK BY AND VERIFIED WITH: Sheffield Slider PharmD 14:15 11/16/17 ([email or web address])    Klebsiella pneumoniae NOT DETECTED NOT DETECTED Final   Proteus species NOT DETECTED NOT DETECTED Final   Serratia marcescens NOT DETECTED NOT DETECTED Final   Carbapenem resistance NOT DETECTED NOT DETECTED Final   Haemophilus influenzae NOT DETECTED NOT DETECTED Final    Neisseria meningitidis NOT DETECTED NOT DETECTED Final   Pseudomonas aeruginosa NOT DETECTED NOT DETECTED Final   Candida albicans NOT DETECTED NOT DETECTED Final   Candida glabrata NOT DETECTED NOT DETECTED Final   Candida krusei NOT DETECTED NOT DETECTED Final   Candida parapsilosis NOT DETECTED NOT DETECTED Final   Candida tropicalis NOT DETECTED NOT DETECTED Final  Culture, blood (routine x 2) Call MD if unable to obtain prior to antibiotics being given     Status: Abnormal   Collection Time: 11/15/17  9:12 PM  Result Value Ref Range Status   Specimen Description   Final  BLOOD LEFT HAND Performed at [facility] [address], [place], [place] [address]    Special Requests   Final    BOTTLES DRAWN AEROBIC AND ANAEROBIC Blood Culture adequate volume Performed at [facility] [address] [place], [place] [address]    Culture  Setup Time   Final    GRAM NEGATIVE RODS IN BOTH AEROBIC AND ANAEROBIC BOTTLES CRITICAL VALUE NOTED.  VALUE IS CONSISTENT WITH PREVIOUSLY REPORTED AND CALLED VALUE.    Culture (A)  Final    KLEBSIELLA OXYTOCA SUSCEPTIBILITIES PERFORMED ON PREVIOUS CULTURE WITHIN THE LAST 5 DAYS. Performed at [facility], [place] [address], [place], [place] [address]    Report Status 11/18/2017 FINAL  Final  Culture, Urine     Status: None   Collection Time: 11/15/17  9:40 PM  Result Value Ref Range Status   Specimen Description   Final    URINE, RANDOM Performed at [facility] [address], [place], [place] [address]    Special Requests   Final    NONE Performed at [facility], [place] [address] [place], [place] [address]    Culture   Final    NO GROWTH Performed at [facility], [place] [address], [place], [place] [address]    Report Status 11/17/2017 FINAL  Final  Culture, blood (routine x 2)     Status: None (Preliminary result)   Collection Time: 11/17/17 12:20 AM    Result Value Ref Range Status   Specimen Description   Final    BLOOD LEFT HAND Performed at [facility] [address] [place], [place] [address]    Special Requests   Final    BOTTLES DRAWN AEROBIC AND ANAEROBIC Blood Culture adequate volume Performed at [facility] [address] [place], [place] [address]    Culture   Final    NO GROWTH 1 DAY Performed at [facility], [place] [address], [place], [place] [address]    Report Status PENDING  Incomplete  Culture, blood (routine x 2)     Status: None (Preliminary result)   Collection Time: 11/17/17 12:20 AM  Result Value Ref Range Status   Specimen Description   Final    BLOOD LEFT ANTECUBITAL Performed at [facility] [address], [place], [place] [address]    Special Requests   Final    BOTTLES DRAWN AEROBIC AND ANAEROBIC Blood Culture adequate volume Performed at [facility] [address] [place], [place] [address]    Culture   Final    NO GROWTH 1 DAY Performed at [facility], [place] [address] [place],  [address]    Report Status PENDING  Incomplete      Studies: No results found.  Scheduled Meds: . Chlorhexidine Gluconate Cloth  6 each Topical Daily  . enoxaparin (LOVENOX) injection  40 mg Subcutaneous Daily  . losartan  50 mg Oral Daily   And  . hydrochlorothiazide  6.25 mg Oral Daily  . insulin aspart  0-15 Units Subcutaneous Q6H  . insulin glargine  10 Units Subcutaneous BID  . ipratropium-albuterol  3 mL Nebulization BID  . mouth rinse  15 mL Mouth Rinse BID  . metoprolol tartrate  10 mg Intravenous Q6H    Continuous Infusions: . [name] KitchenTPN (CLINIMIX-E) Adult 70 mL/hr at 11/19/17 0600  . [name] KitchenTPN (CLINIMIX-E) Adult     And  .  Fat emulsion    . cefTRIAXone (ROCEPHIN)  IV Stopped (11/18/17 2310)     LOS: 13 days     Alma Friendly, MD [facility]   If 7PM-7AM, please contact  night-coverage [email or web address] Password [facility] 11/19/2017, 4:17 PM

## 2017-11-19 NOTE — Evaluation (Addendum)
Clinical/Bedside Swallow Evaluation Patient Details  Name: Oscar [name] MRN: [id number] Date of Birth: March 10, 1933  Today's Date: 11/19/2017 Time: SLP Start Time (ACUTE ONLY): 2585 SLP Stop Time (ACUTE ONLY): 1729 SLP Time Calculation (min) (ACUTE ONLY): 39 min  Past Medical History:  Past Medical History:  Diagnosis Date  . Acid reflux   . Barrett's esophagus   . BPH (benign prostatic hyperplasia)   . Colon polyps   . DDD (degenerative disc disease)   . Diabetes mellitus    controlled with diet and exercise  . ED (erectile dysfunction)   . Esophageal cancer ([place])    tx'd surgery - abdominal & right posterior chest approach (2001) - [place], [place]  . First degree AV block   . Gout   . Hemorrhoids   . History of palpitations   . Hyperlipidemia   . Hypertension   . Lipoma   . Obesity   . Osteoarthritis   . Tinea versicolor   . Tinnitus    Past Surgical History:  Past Surgical History:  Procedure Laterality Date  . bilateral olecranon bursal excisions    . Esophageal Cancer Surgery  2000, 2001  . GASTRIC RESECTION    . HEMORRHOID SURGERY    . NM MYOCAR PERF WALL MOTION  2011   dipyridamole myoview - stress images show medium in size, moderate in intensity perfusion defect in basal inferior & mid inferior walls with mild defect reversibility at rest, EF 64%  . TRANSTHORACIC ECHOCARDIOGRAM  2011   EF=>55%, mild mitral annular calcif, mild calcif of MV apparatus, mild TR, normal RSVP, mild AV regurg, aortic root sclerosis/calcifiication  . VASECTOMY     HPI:  84 yo male adm to [facility] with SBO - pt has declined to have surgical intervention previously but is agreeable now.  Pt being managed by surgery conservatively - he did not tolerate NG tube.  Swallow evaluation ordered due to concerns pt may have aspirated.  He does have a complex hx including partial esophagectomy with gastric pull up approx 20 years ago due to esophageal cancer.  SLP swallow evaluation ordered.     Assessment / Plan / Recommendation Clinical Impression  Pt with negative cranial nerve exam - and no cva hx.  Pt with slight voice decreased strength but suspect due to pt vomiting with aspiration and not cranial nerve related.  Pt observed with intake of ice, jello and cranberry juice.  Pt with 3 swallows to clear single bolus of liquids - and admits this is baseline.  Based on pt report this is due to piecemealing and is compensatory.  No significant indications of aspiration observed with jello, cranberry juice and ice.  Frequent belching noted with minimal intake and pt declined to consume further po stating "I'm full" after 4 ounces of liquids and 1 bite of jello!    Suspect pt likely aspirated due to his vomiting and not due to pharyngeal swallow.  Also suspect motility impaired in esophagus due to gastric pull up and partial esophagectomy.    Pt reports symptoms consistent with probable esophageal deficits including frequent belching with occasional reflux - "bad taste".    Recommend to advance diet to clears - and SLP briefly follow up to assure tolerance given pt's transient laryngeal penetration on esophagram.   Of note, pt reports "getting full fast" and has had significant weight loss (37 pounds) over the last several months.  He also admits food doesn't "taste good", he "gets full fast" and esophageal dysphagia  may contribute to his weight loss.  Advised he and his wife speak to MD re: these concerns.  Advised pt to eat small frequent meals and monitor tolerance, reasons for poor intake closely.    Will follow up during hospitalization briefly to aid in maximizing intake.  Thanks!  SLP Visit Diagnosis: Dysphagia, unspecified (R13.10)    Aspiration Risk  Mild aspiration risk    Diet Recommendation Thin liquid;Other (Comment)(clear liquids)   Liquid Administration via: Cup;Straw Medication Administration: Whole meds with liquid Supervision: Patient able to self  feed Compensations: Slow rate;Small sips/bites(small frequent meals) Postural Changes: Seated upright at 90 degrees;Remain upright for at least 30 minutes after po intake    Other  Recommendations Oral Care Recommendations: Oral care BID   Follow up Recommendations None      Frequency and Duration min 2x/week  1 week       Prognosis Prognosis for Safe Diet Advancement: Fair Barriers to Reach Goals: Other (Comment);Time post onset(prior h/o esophageal deficits)      Swallow Study   General Date of Onset: 11/19/17 HPI: 84 yo male adm to [facility] with SBO - pt has declined to have surgical intervention previously but is agreeable now.  Pt being managed by surgery conservatively - he did not tolerate NG tube.  Swallow evaluation ordered due to concerns pt may have aspirated.  He does have a complex hx including partial esophagectomy with gastric pull up approx 20 years ago due to esophageal cancer.  SLP swallow evaluation ordered.  Type of Study: Bedside Swallow Evaluation Previous Swallow Assessment: esophagram Transient laryngeal penetration without aspiration.  Post esophagectomy with gastric pull up. Smooth narrowing at the anastomotic site (proximal 1/3 of the native esophagus). No evidence of irregularity or leak at the anastomotic site.  Delayed emptying from the gastric pull up into dilated small bowel c/w small bowel obstruction. Transient laryngeal penetration.   Diet Prior to this Study: NPO(ice chips only due to concern for aspiration) Temperature Spikes Noted: No(not today) Respiratory Status: Room air History of Recent Intubation: No Behavior/Cognition: Alert;Cooperative;Pleasant mood Oral Cavity Assessment: Within Functional Limits Oral Care Completed by SLP: No Oral Cavity - Dentition: Missing dentition(poor dentition) Vision: Functional for self-feeding Self-Feeding Abilities: Able to feed self Patient Positioning: Upright in bed Baseline Vocal Quality: (mildly  hoarse) Volitional Cough: Strong Volitional Swallow: Able to elicit    Oral/Motor/Sensory Function Overall Oral Motor/Sensory Function: Within functional limits   Ice Chips Ice chips: Impaired Pharyngeal Phase Impairments: Multiple swallows   Thin Liquid Thin Liquid: Impaired Presentation: Self Fed;Straw Pharyngeal  Phase Impairments: Multiple swallows    Nectar Thick Nectar Thick Liquid: Not tested   Honey Thick Honey Thick Liquid: Not tested   Puree Puree: Within functional limits(jello) Presentation: Self Fed;Spoon   Solid   GO   Solid: Not tested Other Comments: due to pt's bowel issues and plan for clear liquid diet        Macario Golds 11/19/2017,5:51 PM   Luanna Salk, [place] [facility] SLP [phone]

## 2017-11-19 NOTE — Progress Notes (Signed)
Pt. Only tolerated cpap for about 3 mins. Pt. Became extremely agitated and begin to panic and requested the cpap come off. RT took pt. Off the cpap and pt. Was soon fine. No issues at this time. Pt. sats 100%.

## 2017-11-19 NOTE — Progress Notes (Signed)
Subjective/Chief Complaint: Rigors overnight and fever, no abd pain, has had bm   Objective: Vital signs in last 24 hours: Temp:  [97.8 F (36.6 C)-101.7 F (38.7 C)] 97.8 F (36.6 C) (07/13 2355) Resp:  [18-38] 29 (07/14 0700) BP: (123-244)/(39-228) 181/73 (07/14 0700) SpO2:  [84 %-100 %] 100 % (07/14 0700) FiO2 (%):  [28 %] 28 % (07/13 2001) Weight:  [88.2 kg (194 lb 7.1 oz)] 88.2 kg (194 lb 7.1 oz) (07/14 0610) Last BM Date: 11/17/17  Intake/Output from previous day: 07/13 0701 - 07/14 0700 In: 2162.9 [P.O.:120; I.V.:1743.8; IV Piggyback:299.1] Out: 566 [Urine:565; Stool:1] Intake/Output this shift: No intake/output data recorded.  GI: soft nontender nondistended bs present   Lab Results:  Recent Labs    11/18/17 0544 11/19/17 0442  WBC 26.6* 23.2*  HGB 9.4* 9.3*  HCT 27.7* 27.3*  PLT 265 302   BMET Recent Labs    11/18/17 0544 11/19/17 0442  NA 139 136  K 3.6 3.3*  CL 103 101  CO2 28 27  GLUCOSE 297* 241*  BUN 16 16  CREATININE 0.98 0.88  CALCIUM 8.0* 7.9*   PT/INR No results for input(s): LABPROT, INR in the last 72 hours. ABG No results for input(s): PHART, HCO3 in the last 72 hours.  Invalid input(s): PCO2, PO2  Studies/Results: Dg Abd Portable 1v  Result Date: 11/17/2017 CLINICAL DATA:  Abdominal pain and distention. EXAM: PORTABLE ABDOMEN - 1 VIEW COMPARISON:  Radiographs of November 16, 2017. FINDINGS: No small bowel dilatation is noted currently, which is significantly improved compared to prior exam. Residual contrast is noted within small bowel and colon. Phleboliths are noted in the pelvis. IMPRESSION: No bowel dilatation is noted currently. Electronically Signed   By: Marijo Conception, M.D.   On: 11/17/2017 08:57    Anti-infectives: Anti-infectives (From admission, onward)   Start     Dose/Rate Route Frequency Ordered Stop   11/18/17 0800  vancomycin (VANCOCIN) IVPB 750 mg/150 ml premix  Status:  Discontinued     750 mg 150 mL/hr  over 60 Minutes Intravenous Every 12 hours 11/17/17 1642 11/17/17 1805   11/17/17 1830  cefTRIAXone (ROCEPHIN) 2 g in sodium chloride 0.9 % 100 mL IVPB     2 g 200 mL/hr over 30 Minutes Intravenous Every 24 hours 11/17/17 1805     11/17/17 1800  vancomycin (VANCOCIN) 1,500 mg in sodium chloride 0.9 % 500 mL IVPB  Status:  Discontinued     1,500 mg 250 mL/hr over 120 Minutes Intravenous  Once 11/17/17 1642 11/17/17 1805   11/17/17 1800  meropenem (MERREM) 1 g in sodium chloride 0.9 % 100 mL IVPB  Status:  Discontinued     1 g 200 mL/hr over 30 Minutes Intravenous Every 8 hours 11/17/17 1647 11/17/17 1805   11/16/17 2200  cefTRIAXone (ROCEPHIN) 2 g in sodium chloride 0.9 % 100 mL IVPB  Status:  Discontinued     2 g 200 mL/hr over 30 Minutes Intravenous Every 24 hours 11/16/17 1447 11/17/17 1609   11/16/17 2100  vancomycin (VANCOCIN) 1,500 mg in sodium chloride 0.9 % 500 mL IVPB  Status:  Discontinued     1,500 mg 250 mL/hr over 120 Minutes Intravenous Every 24 hours 11/15/17 1951 11/16/17 1131   11/15/17 2200  piperacillin-tazobactam (ZOSYN) IVPB 3.375 g  Status:  Discontinued     3.375 g 12.5 mL/hr over 240 Minutes Intravenous Every 8 hours 11/15/17 2137 11/16/17 1447   11/15/17 2030  vancomycin (VANCOCIN) 1,750  mg in sodium chloride 0.9 % 500 mL IVPB     1,750 mg 250 mL/hr over 120 Minutes Intravenous  Once 11/15/17 1948 11/15/17 2330   11/15/17 2000  ceFEPIme (MAXIPIME) 1 g in sodium chloride 0.9 % 100 mL IVPB  Status:  Discontinued     1 g 200 mL/hr over 30 Minutes Intravenous Every 8 hours 11/15/17 1932 11/15/17 2132   11/08/17 1200  fluconazole (DIFLUCAN) IVPB 100 mg  Status:  Discontinued     100 mg 50 mL/hr over 60 Minutes Intravenous Every 24 hours 11/08/17 1032 11/10/17 0934      Assessment/Plan: SBO- admitted 11/06/17 - this has resolved clinically and radiologically at this point -he can have diet advanced per medicine this is dependent on his asp pna -I dont think last  nights episode related to his abdomen AspirationPNA- per medicine and id Malnutritionrelated to chronic illness- Prealbumin 6.1, TPN started yesterday    Rolm Bookbinder 11/19/2017

## 2017-11-19 NOTE — Progress Notes (Addendum)
PHARMACY - ADULT TOTAL PARENTERAL NUTRITION CONSULT NOTE   Pharmacy Consult for TPN Indication: small bowel obstruction  Patient Measurements: Height: '6\' 2"'  (188 cm) Weight: 194 lb 7.1 oz (88.2 kg) IBW/kg (Calculated) : 82.2 TPN AdjBW (KG): 88.9 Body mass index is 24.97 kg/m.  Insulin Requirements: 32 units via sliding scale/24 hours while on lantus 10 units SubQ qHS  Current Nutrition: TPN, advancing to clear liquid diet pending swallow eval  IVF: IVF d/c  Central access: PICC placed 7/9 TPN start date: 7/9  ASSESSMENT                                                                                                          HPI:  32 y/oM with PMH of HTN, remote esophageal cancer in remission, OSA, DM controlled with diet and exercise who presented to [facility] ED on 11/06/17 with abdominal pain, n/v and found to have small bowel obstruction. Abdominal film today c/w ongoing partial small bowel obstruction. Pharmacy consulted for TPN.   Significant events:  7/9-7/12 Reglan 5 mg q6h 7/10 Aspirated overnight, Vomit reportedly feculent smelling/appearing.  Transferred to ICU. 7/14 per surgery note, SBO has resolved. Advance to clear liquid diet  Today:   Glucose - CBGs 210-286 (Goal < 150), lantus 10 units qHS has been increased to lantus 10 units SQ BID per MD.  Electrolytes - K 3.3 is low, others Na, Cl, Phos, Mg, corrected calcium are WNL.  Renal - SCr 0.9 (stable)  LFTs - AST/ALT, Alk Phos WNL. Tbili has decreased to WNL.  Prealbumin - 6.1 (7/9)  NUTRITIONAL GOALS                                                                                       RD recs (7/9): Kcal:  2400-2600 kcal , Protein:  120-130 g  Clinimix 5/20 at a goal rate of 150m/hr + 20% fat emulsion at 287mhr x 12h to provide:  120 g/day protein, 2592 kcal/day.  PLAN                                                                                                                          Now:  Potassium  chloride 30  mEq IV  At 1800 today:  Continue Clinimix E 5/20 at 70 mL/hr - will hold off on advancing rate with hypokalemia  Continue 20% fat emulsion at 34m/hr x 12h.  Plan to advance as tolerated to the goal rate on electrolytes normalize  TPN to contain standard multivitamins and trace elements. Tbili has decreased to WNL, ok to continue trace elements.  Continue famotidine 417min TPN bag.    BG elevated above goal but lantus increased to 10 units SQ BID per MD. Will change SSI from resistant to moderate SSI q6h and monitor BG over next 24 hours. If remain elevated and rate advances, can consider adding to TPN.  IVF per MD  TPN lab panels in AM and on Mondays & Thursdays.  F/u daily.    MaTheodis ShovePharmD, BCPS Clinical Pharmacist Pager: [phone]/14/19 11:43 AM  Addendum: Ordered a total of 40 mEq KCl (not 30 mEq).   MaLenis Noon07/14/19 11:51 AM

## 2017-11-19 NOTE — Progress Notes (Signed)
Patient ID: Oscar [name], male   DOB: 01-25-1933, 84 y.o.   MRN: [id number]         [facility]  Date of Admission:  11/06/2017    Total days of antibiotics 5        Day 4 ceftriaxone         ASSESSMENT: Because of last night's fever and chills is unclear.  It is possible it could be a complication of his recent Klebsiella bacteremia or aspiration pneumonia.  Repeat blood cultures done on 11/17/2017 are negative and his room air oxygen saturation is 99-100%.  It appears that his small bowel obstruction has resolved.  I do not find evidence of any new infection or allergic reaction to ceftriaxone.  If he has more fevers I will repeat blood cultures and would consider PICC removal and repeat chest x-ray.  PLAN: 1. Continue ceftriaxone for now 2. Repeat blood cultures if he has more fever  Principal Problem:   SBO (small bowel obstruction) (HCC) Active Problems:   HTN (hypertension)   OSA (obstructive sleep apnea)   Acute on chronic respiratory failure with hypoxia (HCC)   Scheduled Meds: . Chlorhexidine Gluconate Cloth  6 each Topical Daily  . enoxaparin (LOVENOX) injection  40 mg Subcutaneous Daily  . insulin aspart  0-20 Units Subcutaneous Q6H  . insulin glargine  10 Units Subcutaneous BID  . ipratropium-albuterol  3 mL Nebulization BID  . mouth rinse  15 mL Mouth Rinse BID  . metoprolol tartrate  10 mg Intravenous Q6H   Continuous Infusions: . [name] KitchenTPN (CLINIMIX-E) Adult 70 mL/hr at 11/19/17 0600  . cefTRIAXone (ROCEPHIN)  IV Stopped (11/18/17 2310)  . potassium chloride 10 mEq (11/19/17 0922)   PRN Meds:.acetaminophen **OR** acetaminophen, bisacodyl, hydrALAZINE, ipratropium-albuterol, morphine injection, ondansetron **OR** ondansetron (ZOFRAN) IV, phenol, promethazine **OR** promethazine, sodium chloride flush   SUBJECTIVE: "I just feel weird".  He had fever of 101.7 degrees last night associated with severe rigors.  He has had some cough  productive of white sputum and mild shortness of breath.  He denies abdominal pain.  He denies dysuria and diarrhea.  Review of Systems: Review of Systems  Constitutional: Positive for chills, fever and malaise/fatigue. Negative for diaphoresis.  Respiratory: Positive for cough, sputum production and shortness of breath.   Cardiovascular: Negative for chest pain.  Gastrointestinal: Negative for abdominal pain, diarrhea, nausea and vomiting.  Genitourinary: Negative for dysuria.  Skin: Negative for rash.    Allergies  Allergen Reactions  . Bee Venom Anaphylaxis  . Tamsulosin Other (See Comments)    "Makes me feel weird"    OBJECTIVE: Vitals:   11/19/17 0720 11/19/17 0800 11/19/17 0812 11/19/17 0925  BP: (!) 177/63 (!) 176/66  (!) 163/59  Pulse:      Resp: (!) 21 (!) 30  (!) 25  Temp:  98.4 F (36.9 C)    TempSrc:  Oral    SpO2: 100% 98% 100% 100%  Weight:      Height:       Body mass index is 24.97 kg/m.  Physical Exam  Constitutional: He is oriented to person, place, and time.  He is much more alert and talkative today.  He seems worried but in no distress.  Neck: Neck supple.  Cardiovascular: Normal rate, regular rhythm and normal heart sounds.  No murmur heard. Pulmonary/Chest: Effort normal and breath sounds normal.  Abdominal: Soft. He exhibits no distension. There is no tenderness.  Musculoskeletal: Normal range of motion. He  exhibits no edema or tenderness.  Neurological: He is alert and oriented to person, place, and time.  Skin: No rash noted.  Right arm PICC site appears normal.    Lab Results Lab Results  Component Value Date   WBC 23.2 (H) 11/19/2017   HGB 9.3 (L) 11/19/2017   HCT 27.3 (L) 11/19/2017   MCV 95.8 11/19/2017   PLT 302 11/19/2017    Lab Results  Component Value Date   CREATININE 0.88 11/19/2017   BUN 16 11/19/2017   NA 136 11/19/2017   K 3.3 (L) 11/19/2017   CL 101 11/19/2017   CO2 27 11/19/2017    Lab Results  Component  Value Date   ALT 33 11/19/2017   AST 43 (H) 11/19/2017   ALKPHOS 76 11/19/2017   BILITOT 1.1 11/19/2017     Microbiology: Recent Results (from the past 240 hour(s))  MRSA PCR Screening     Status: None   Collection Time: 11/15/17  7:54 PM  Result Value Ref Range Status   MRSA by PCR NEGATIVE NEGATIVE Final    Comment:        The GeneXpert MRSA Assay (FDA approved for NASAL specimens only), is one component of a comprehensive MRSA colonization surveillance program. It is not intended to diagnose MRSA infection nor to guide or monitor treatment for MRSA infections. Performed at [facility], [place] [address] [place], [place] [address]   Culture, blood (routine x 2) Call MD if unable to obtain prior to antibiotics being given     Status: Abnormal   Collection Time: 11/15/17  9:01 PM  Result Value Ref Range Status   Specimen Description   Final    BLOOD LEFT ANTECUBITAL Performed at [facility] [address] [place], [place] [address]    Special Requests   Final    BOTTLES DRAWN AEROBIC AND ANAEROBIC Blood Culture adequate volume Performed at [facility] [address], [place], [place] [address]    Culture  Setup Time   Final    GRAM NEGATIVE RODS IN BOTH AEROBIC AND ANAEROBIC BOTTLES CRITICAL RESULT CALLED TO, READ BACK BY AND VERIFIED WITH: Sheffield Slider PharmD 14:15 11/16/17 ([email or web address]) Performed at [facility], [place] [address], [place],  [address]    Culture KLEBSIELLA OXYTOCA (A)  Final   Report Status 11/18/2017 FINAL  Final   Organism ID, Bacteria KLEBSIELLA OXYTOCA  Final      Susceptibility   Klebsiella oxytoca - MIC*    AMPICILLIN >=32 RESISTANT Resistant     CEFAZOLIN <=4 SENSITIVE Sensitive     CEFEPIME <=1 SENSITIVE Sensitive     CEFTAZIDIME <=1 SENSITIVE Sensitive     CEFTRIAXONE <=1 SENSITIVE Sensitive     CIPROFLOXACIN <=0.25 SENSITIVE Sensitive     GENTAMICIN <=1 SENSITIVE  Sensitive     IMIPENEM <=0.25 SENSITIVE Sensitive     TRIMETH/SULFA <=20 SENSITIVE Sensitive     AMPICILLIN/SULBACTAM 4 SENSITIVE Sensitive     PIP/TAZO <=4 SENSITIVE Sensitive     * KLEBSIELLA OXYTOCA  Blood Culture ID Panel (Reflexed)     Status: Abnormal   Collection Time: 11/15/17  9:01 PM  Result Value Ref Range Status   Enterococcus species NOT DETECTED NOT DETECTED Final   Listeria monocytogenes NOT DETECTED NOT DETECTED Final   Staphylococcus species NOT DETECTED NOT DETECTED Final   Staphylococcus aureus NOT DETECTED NOT DETECTED Final   Streptococcus species NOT DETECTED NOT DETECTED Final   Streptococcus agalactiae  NOT DETECTED NOT DETECTED Final   Streptococcus pneumoniae NOT DETECTED NOT DETECTED Final   Streptococcus pyogenes NOT DETECTED NOT DETECTED Final   Acinetobacter baumannii NOT DETECTED NOT DETECTED Final   Enterobacteriaceae species DETECTED (A) NOT DETECTED Final    Comment: Enterobacteriaceae represent a large family of gram-negative bacteria, not a single organism. CRITICAL RESULT CALLED TO, READ BACK BY AND VERIFIED WITH: Sheffield Slider PharmD 14:15 11/16/17 ([email or web address])    Enterobacter cloacae complex NOT DETECTED NOT DETECTED Final   Escherichia coli NOT DETECTED NOT DETECTED Final   Klebsiella oxytoca DETECTED (A) NOT DETECTED Final    Comment: CRITICAL RESULT CALLED TO, READ BACK BY AND VERIFIED WITH: Sheffield Slider PharmD 14:15 11/16/17 ([email or web address])    Klebsiella pneumoniae NOT DETECTED NOT DETECTED Final   Proteus species NOT DETECTED NOT DETECTED Final   Serratia marcescens NOT DETECTED NOT DETECTED Final   Carbapenem resistance NOT DETECTED NOT DETECTED Final   Haemophilus influenzae NOT DETECTED NOT DETECTED Final   Neisseria meningitidis NOT DETECTED NOT DETECTED Final   Pseudomonas aeruginosa NOT DETECTED NOT DETECTED Final   Candida albicans NOT DETECTED NOT DETECTED Final   Candida glabrata NOT DETECTED NOT DETECTED Final   Candida krusei NOT  DETECTED NOT DETECTED Final   Candida parapsilosis NOT DETECTED NOT DETECTED Final   Candida tropicalis NOT DETECTED NOT DETECTED Final  Culture, blood (routine x 2) Call MD if unable to obtain prior to antibiotics being given     Status: Abnormal   Collection Time: 11/15/17  9:12 PM  Result Value Ref Range Status   Specimen Description   Final    BLOOD LEFT HAND Performed at [facility], [place] [address], [place], [place] [address]    Special Requests   Final    BOTTLES DRAWN AEROBIC AND ANAEROBIC Blood Culture adequate volume Performed at [facility] [address] [place], [place] [address]    Culture  Setup Time   Final    GRAM NEGATIVE RODS IN BOTH AEROBIC AND ANAEROBIC BOTTLES CRITICAL VALUE NOTED.  VALUE IS CONSISTENT WITH PREVIOUSLY REPORTED AND CALLED VALUE.    Culture (A)  Final    KLEBSIELLA OXYTOCA SUSCEPTIBILITIES PERFORMED ON PREVIOUS CULTURE WITHIN THE LAST 5 DAYS. Performed at [facility], [place] [address] [place], [place] [address]    Report Status 11/18/2017 FINAL  Final  Culture, Urine     Status: None   Collection Time: 11/15/17  9:40 PM  Result Value Ref Range Status   Specimen Description   Final    URINE, RANDOM Performed at [facility] [address], [place], [place] [address]    Special Requests   Final    NONE Performed at [facility], [place] [address], [place], [place] [address]    Culture   Final    NO GROWTH Performed at [facility], [place] [address] [place], [place] [address]    Report Status 11/17/2017 FINAL  Final  Culture, blood (routine x 2)     Status: None (Preliminary result)   Collection Time: 11/17/17 12:20 AM  Result Value Ref Range Status   Specimen Description   Final    BLOOD LEFT HAND Performed at [facility] [address], [place], [place] [address]    Special Requests   Final    BOTTLES DRAWN AEROBIC AND  ANAEROBIC Blood Culture adequate volume Performed at [facility] [address] [place], [place]  [address]    Culture   Final    NO GROWTH 1 DAY Performed at [facility], [place] [address], [place], [place] [address]    Report Status PENDING  Incomplete  Culture, blood (routine x 2)     Status: None (Preliminary result)   Collection Time: 11/17/17 12:20 AM  Result Value Ref Range Status   Specimen Description   Final    BLOOD LEFT ANTECUBITAL Performed at [facility] [address] [place], [place] [address]    Special Requests   Final    BOTTLES DRAWN AEROBIC AND ANAEROBIC Blood Culture adequate volume Performed at [facility] [address], [place], [place] [address]    Culture   Final    NO GROWTH 1 DAY Performed at [facility], [place] [address], [place], [place] [address]    Report Status PENDING  Incomplete    Michel Bickers, MD [facility] [phone] pager   [phone] cell 11/19/2017, 11:02 AM

## 2017-11-20 ENCOUNTER — Inpatient Hospital Stay (HOSPITAL_COMMUNITY): Payer: Medicare Other

## 2017-11-20 LAB — COMPREHENSIVE METABOLIC PANEL
ALK PHOS: 67 U/L (ref 38–126)
ALT: 30 U/L (ref 0–44)
AST: 28 U/L (ref 15–41)
Albumin: 2.2 g/dL — ABNORMAL LOW (ref 3.5–5.0)
Anion gap: 7 (ref 5–15)
BILIRUBIN TOTAL: 0.7 mg/dL (ref 0.3–1.2)
BUN: 17 mg/dL (ref 8–23)
CALCIUM: 7.7 mg/dL — AB (ref 8.9–10.3)
CO2: 25 mmol/L (ref 22–32)
CREATININE: 0.83 mg/dL (ref 0.61–1.24)
Chloride: 99 mmol/L (ref 98–111)
Glucose, Bld: 226 mg/dL — ABNORMAL HIGH (ref 70–99)
Potassium: 3.5 mmol/L (ref 3.5–5.1)
Sodium: 131 mmol/L — ABNORMAL LOW (ref 135–145)
Total Protein: 5.7 g/dL — ABNORMAL LOW (ref 6.5–8.1)

## 2017-11-20 LAB — CBC WITH DIFFERENTIAL/PLATELET
BASOS ABS: 0 10*3/uL (ref 0.0–0.1)
Basophils Relative: 0 %
EOS PCT: 1 %
Eosinophils Absolute: 0.2 10*3/uL (ref 0.0–0.7)
HCT: 26.5 % — ABNORMAL LOW (ref 39.0–52.0)
Hemoglobin: 8.9 g/dL — ABNORMAL LOW (ref 13.0–17.0)
LYMPHS ABS: 1.1 10*3/uL (ref 0.7–4.0)
LYMPHS PCT: 6 %
MCH: 32.4 pg (ref 26.0–34.0)
MCHC: 33.6 g/dL (ref 30.0–36.0)
MCV: 96.4 fL (ref 78.0–100.0)
MONO ABS: 1.8 10*3/uL — AB (ref 0.1–1.0)
Monocytes Relative: 10 %
Neutro Abs: 14.5 10*3/uL — ABNORMAL HIGH (ref 1.7–7.7)
Neutrophils Relative %: 83 %
PLATELETS: 353 10*3/uL (ref 150–400)
RBC: 2.75 MIL/uL — ABNORMAL LOW (ref 4.22–5.81)
RDW: 13.6 % (ref 11.5–15.5)
WBC: 17.5 10*3/uL — ABNORMAL HIGH (ref 4.0–10.5)

## 2017-11-20 LAB — TRIGLYCERIDES: Triglycerides: 124 mg/dL (ref <150)

## 2017-11-20 LAB — GLUCOSE, CAPILLARY
Glucose-Capillary: 187 mg/dL — ABNORMAL HIGH (ref 70–99)
Glucose-Capillary: 249 mg/dL — ABNORMAL HIGH (ref 70–99)
Glucose-Capillary: 143 mg/dL — ABNORMAL HIGH (ref 70–99)
Glucose-Capillary: 175 mg/dL — ABNORMAL HIGH (ref 70–99)

## 2017-11-20 LAB — PHOSPHORUS: Phosphorus: 3.4 mg/dL (ref 2.5–4.6)

## 2017-11-20 LAB — MAGNESIUM: MAGNESIUM: 1.9 mg/dL (ref 1.7–2.4)

## 2017-11-20 LAB — PREALBUMIN: Prealbumin: <5 mg/dL — ABNORMAL LOW (ref 18–38)

## 2017-11-20 MED ORDER — POTASSIUM CHLORIDE 10 MEQ/50ML IV SOLN
10.0000 meq | INTRAVENOUS | Status: AC
  Administered 2017-11-20 (×5): 10 meq via INTRAVENOUS
  Filled 2017-11-20 (×5): qty 50

## 2017-11-20 MED ORDER — INSULIN ASPART 100 UNIT/ML ~~LOC~~ SOLN
0.0000 [IU] | Freq: Three times a day (TID) | SUBCUTANEOUS | Status: DC
  Administered 2017-11-20: 3 [IU] via SUBCUTANEOUS
  Administered 2017-11-20: 7 [IU] via SUBCUTANEOUS
  Administered 2017-11-21: 3 [IU] via SUBCUTANEOUS
  Administered 2017-11-21: 4 [IU] via SUBCUTANEOUS
  Administered 2017-11-22: 3 [IU] via SUBCUTANEOUS

## 2017-11-20 MED ORDER — LABETALOL HCL 5 MG/ML IV SOLN
5.0000 mg | Freq: Once | INTRAVENOUS | Status: AC
  Administered 2017-11-20: 5 mg via INTRAVENOUS
  Filled 2017-11-20: qty 4

## 2017-11-20 MED ORDER — FAT EMULSION PLANT BASED 20 % IV EMUL
240.0000 mL | INTRAVENOUS | Status: AC
  Administered 2017-11-20: 240 mL via INTRAVENOUS
  Filled 2017-11-20: qty 250

## 2017-11-20 MED ORDER — TRACE MINERALS CR-CU-MN-SE-ZN 10-1000-500-60 MCG/ML IV SOLN
INTRAVENOUS | Status: AC
  Administered 2017-11-20: 17:00:00 via INTRAVENOUS
  Filled 2017-11-20: qty 1992

## 2017-11-20 MED ORDER — INSULIN ASPART 100 UNIT/ML ~~LOC~~ SOLN: 0.0000 [IU] | Freq: Every day | SUBCUTANEOUS | Status: DC

## 2017-11-20 NOTE — Progress Notes (Signed)
Nutrition Follow-up  DOCUMENTATION CODES:   Non-severe (moderate) malnutrition in context of chronic illness  INTERVENTION:  - Continue TPN/TPN advancement versus weaning per Pharmacy. - Will order Ensure Enlive once/day, this supplement provides 350 kcal and 20 grams of protein. - Will order Boost Breeze once/day, this supplement provides 250 kcal and 9 grams of protein. - Continue to encourage PO intakes.   NUTRITION DIAGNOSIS:   Moderate Malnutrition related to chronic illness(recurrent SBO) as evidenced by energy intake < 75% for > or equal to 1 month, moderate muscle depletion, moderate fat depletion, severe muscle depletion.  GOAL:   Patient will meet greater than or equal to 90% of their needs  MONITOR:   PO intake, Supplement acceptance, Diet advancement, Weight trends, Labs, Other (Comment)(TPN regimen)  ASSESSMENT:   Patient with PMH significant for HTN, remote esophogeal cancer s/p esophagectomy 2001, DM, and HLD. Presents this admission with complaints of nausea/vomiting x 24 hours. Found to have a small bowel obstruction. NGT unable to be placed in radiology.   Weight has been stable throughout admission. Diet advanced from NPO to CLD on 7/13 at 3:40 PM. Patient was able to take a few sips each of coffee and ginger ale for breakfast this AM. Diet was advanced to FLD today at 12:20 PM. Patient has not yet received a lunch tray.  He has a double lumen PICC in R brachial and TPN to be advanced this evening to Clinimix E 5/20 @ 83 mL/hr with 20% ILE @ 20 mL/hr x12 hours/day. This regimen provides 2232 kcal and 100 grams of protein. Noted goal rate of Clinimix E 5/20 @ 100 mL/hr with 20% ILE @ 20 mL/hr x12 hours/day. This regimen provides 2592 kcal and 120 grams of protein. Per Pharmacist's note this AM, holding on advancement to goal rate given diet advancement today.   Per Dr. Orest Dikes note this AM, SBO has resolved clinically and radiologically, but diet advancement to be  per Hospitalist d/t aspiration PNA.    Medications reviewed; sliding scale Novolog, 10 units Lantus BID, 6.25 mg hydrochlorothiazide/day. Labs reviewed; CBGs: 187 and 249 mg/dL today, Na: 131 mmol/L, Ca: 7.7 mg/dL.      Diet Order:   Diet Order           Diet full liquid Room service appropriate? Yes; Fluid consistency: Thin  Diet effective now          EDUCATION NEEDS:   Education needs have been addressed  Skin:  Skin Assessment: Reviewed RN Assessment  Last BM:  7/15  Height:   Ht Readings from Last 1 Encounters:  11/06/17 6\' 2"  (1.88 m)    Weight:   Wt Readings from Last 1 Encounters:  11/20/17 197 lb 8.5 oz (89.6 kg)    Ideal Body Weight:  86.4 kg  BMI:  Body mass index is 25.36 kg/m.  Estimated Nutritional Needs:   Kcal:  2400-2600 kcal  Protein:  120-130 g  Fluid:  >2.4 L/day      Oscar Matin, MS, RD, LDN, [facility] Inpatient Clinical Dietitian Pager # [phone] After hours/weekend pager # [phone]

## 2017-11-20 NOTE — Progress Notes (Signed)
Physical Therapy Treatment Patient Details Name: Oscar [name] MRN: [id number] DOB: 04-Dec-1932 Today's Date: 11/20/2017    History of Present Illness 84 yo male admitted with SBO, abd pain. Hx of remote esophageal ca, OA, gout, Av block, DM    PT Comments    Progressing with mobility after transfer to ICU recently.    Follow Up Recommendations  Home health PT;Supervision/Assistance - 24 hour     Equipment Recommendations  Rolling walker with 5" wheels    Recommendations for Other Services       Precautions / Restrictions Precautions Precautions: Fall Restrictions Weight Bearing Restrictions: No    Mobility  Bed Mobility               General bed mobility comments: oob in recliner  Transfers Overall transfer level: Needs assistance Equipment used: Rolling walker (2 wheeled) Transfers: Sit to/from [facility] Sit to Stand: Min guard Stand pivot transfers: Min assist       General transfer comment: A to steady. VC safety, hand placement. Stand pivot, recliner to bsc.   Ambulation/Gait Ambulation/Gait assistance: Min guard Gait Distance (Feet): 100 Feet Assistive device: Rolling walker (2 wheeled) Gait Pattern/deviations: Step-through pattern;Decreased stride length     General Gait Details: close guard for safety. Multiple brief standing rest breaks. Pt fatigued after walk.    Stairs             Wheelchair Mobility    Modified Rankin (Stroke Patients Only)       Balance Overall balance assessment: Needs assistance           Standing balance-Leahy Scale: Fair                              Cognition Arousal/Alertness: Awake/alert Behavior During Therapy: WFL for tasks assessed/performed Overall Cognitive Status: Within Functional Limits for tasks assessed                                        Exercises      General Comments        Pertinent Vitals/Pain Faces Pain Scale: No hurt     Home Living                      Prior Function            PT Goals (current goals can now be found in the care plan section) Progress towards PT goals: Progressing toward goals    Frequency    Min 3X/week      PT Plan Current plan remains appropriate    Co-evaluation              AM-PAC PT "6 Clicks" Daily Activity  Outcome Measure  Difficulty turning over in bed (including adjusting bedclothes, sheets and blankets)?: A Little Difficulty moving from lying on back to sitting on the side of the bed? : A Little Difficulty sitting down on and standing up from a chair with arms (e.g., wheelchair, bedside commode, etc,.)?: A Little Help needed moving to and from a bed to chair (including a wheelchair)?: A Little Help needed walking in hospital room?: A Little Help needed climbing 3-5 steps with a railing? : A Lot 6 Click Score: 17    End of Session Equipment Utilized During Treatment: Gait belt Activity Tolerance: Patient tolerated treatment  well Patient left: in chair;with chair alarm set;with call bell/phone within reach   PT Visit Diagnosis: Muscle weakness (generalized) (M62.81);Unsteadiness on feet (R26.81)     Time: 4591-3685 PT Time Calculation (min) (ACUTE ONLY): 22 min  Charges:  $Gait Training: 8-22 mins                    G Codes:          Weston Anna, MPT Pager: [phone]

## 2017-11-20 NOTE — Progress Notes (Signed)
INFECTIOUS DISEASE PROGRESS NOTE  ID: Oscar [name] is a 84 y.o. male with  Principal Problem:   SBO (small bowel obstruction) ([place]) Active Problems:   HTN (hypertension)   OSA (obstructive sleep apnea)   Acute on chronic respiratory failure with hypoxia (HCC)  Subjective: Sitting up in chair, comfortable.  100% on RA Has ambulated yesterday and today with the help of his outstanding nurse.   Abtx:  Anti-infectives (From admission, onward)   Start     Dose/Rate Route Frequency Ordered Stop   11/18/17 0800  vancomycin (VANCOCIN) IVPB 750 mg/150 ml premix  Status:  Discontinued     750 mg 150 mL/hr over 60 Minutes Intravenous Every 12 hours 11/17/17 1642 11/17/17 1805   11/17/17 1830  cefTRIAXone (ROCEPHIN) 2 g in sodium chloride 0.9 % 100 mL IVPB     2 g 200 mL/hr over 30 Minutes Intravenous Every 24 hours 11/17/17 1805     11/17/17 1800  vancomycin (VANCOCIN) 1,500 mg in sodium chloride 0.9 % 500 mL IVPB  Status:  Discontinued     1,500 mg 250 mL/hr over 120 Minutes Intravenous  Once 11/17/17 1642 11/17/17 1805   11/17/17 1800  meropenem (MERREM) 1 g in sodium chloride 0.9 % 100 mL IVPB  Status:  Discontinued     1 g 200 mL/hr over 30 Minutes Intravenous Every 8 hours 11/17/17 1647 11/17/17 1805   11/16/17 2200  cefTRIAXone (ROCEPHIN) 2 g in sodium chloride 0.9 % 100 mL IVPB  Status:  Discontinued     2 g 200 mL/hr over 30 Minutes Intravenous Every 24 hours 11/16/17 1447 11/17/17 1609   11/16/17 2100  vancomycin (VANCOCIN) 1,500 mg in sodium chloride 0.9 % 500 mL IVPB  Status:  Discontinued     1,500 mg 250 mL/hr over 120 Minutes Intravenous Every 24 hours 11/15/17 1951 11/16/17 1131   11/15/17 2200  piperacillin-tazobactam (ZOSYN) IVPB 3.375 g  Status:  Discontinued     3.375 g 12.5 mL/hr over 240 Minutes Intravenous Every 8 hours 11/15/17 2137 11/16/17 1447   11/15/17 2030  vancomycin (VANCOCIN) 1,750 mg in sodium chloride 0.9 % 500 mL IVPB     1,750 mg 250 mL/hr over  120 Minutes Intravenous  Once 11/15/17 1948 11/15/17 2330   11/15/17 2000  ceFEPIme (MAXIPIME) 1 g in sodium chloride 0.9 % 100 mL IVPB  Status:  Discontinued     1 g 200 mL/hr over 30 Minutes Intravenous Every 8 hours 11/15/17 1932 11/15/17 2132   11/08/17 1200  fluconazole (DIFLUCAN) IVPB 100 mg  Status:  Discontinued     100 mg 50 mL/hr over 60 Minutes Intravenous Every 24 hours 11/08/17 1032 11/10/17 0934      Medications:  Scheduled: . Chlorhexidine Gluconate Cloth  6 each Topical Daily  . enoxaparin (LOVENOX) injection  40 mg Subcutaneous Daily  . losartan  50 mg Oral Daily   And  . hydrochlorothiazide  6.25 mg Oral Daily  . insulin aspart  0-20 Units Subcutaneous TID WC  . insulin aspart  0-5 Units Subcutaneous QHS  . insulin glargine  10 Units Subcutaneous BID  . mouth rinse  15 mL Mouth Rinse BID  . metoprolol tartrate  10 mg Intravenous Q6H    Objective: Vital signs in last 24 hours: Temp:  [97.9 F (36.6 C)-99.5 F (37.5 C)] 98 F (36.7 C) (07/15 1200) Pulse Rate:  [104] 104 (07/14 2340) Resp:  [18-35] 23 (07/15 1007) BP: (142-210)/(47-81) 173/81 (07/15 1000) SpO2:  [  98 %-100 %] 100 % (07/15 1007) Weight:  [89.6 kg (197 lb 8.5 oz)] 89.6 kg (197 lb 8.5 oz) (07/15 0530)   General appearance: alert, cooperative and no distress Resp: diminished breath sounds anterior - bilateral Cardio: regular rate and rhythm GI: normal findings: bowel sounds normal and soft, non-tender Extremities: RUE PIC is clean, non-tender.   Lab Results Recent Labs    11/19/17 0442 11/20/17 0509  WBC 23.2* 17.5*  HGB 9.3* 8.9*  HCT 27.3* 26.5*  NA 136 131*  K 3.3* 3.5  CL 101 99  CO2 27 25  BUN 16 17  CREATININE 0.88 0.83   Liver Panel Recent Labs    11/19/17 0442 11/20/17 0509  PROT 5.4* 5.7*  ALBUMIN 2.2* 2.2*  AST 43* 28  ALT 33 30  ALKPHOS 76 67  BILITOT 1.1 0.7   Sedimentation Rate No results for input(s): ESRSEDRATE in the last 72 hours. C-Reactive  Protein No results for input(s): CRP in the last 72 hours.  Microbiology: Recent Results (from the past 240 hour(s))  MRSA PCR Screening     Status: None   Collection Time: 11/15/17  7:54 PM  Result Value Ref Range Status   MRSA by PCR NEGATIVE NEGATIVE Final    Comment:        The GeneXpert MRSA Assay (FDA approved for NASAL specimens only), is one component of a comprehensive MRSA colonization surveillance program. It is not intended to diagnose MRSA infection nor to guide or monitor treatment for MRSA infections. Performed at [facility], [place] [address], [place], [place] [address]   Culture, blood (routine x 2) Call MD if unable to obtain prior to antibiotics being given     Status: Abnormal   Collection Time: 11/15/17  9:01 PM  Result Value Ref Range Status   Specimen Description   Final    BLOOD LEFT ANTECUBITAL Performed at [facility] [address], [place], [place] [address]    Special Requests   Final    BOTTLES DRAWN AEROBIC AND ANAEROBIC Blood Culture adequate volume Performed at [facility] [address] [place], [place] [address]    Culture  Setup Time   Final    GRAM NEGATIVE RODS IN BOTH AEROBIC AND ANAEROBIC BOTTLES CRITICAL RESULT CALLED TO, READ BACK BY AND VERIFIED WITH: Sheffield Slider PharmD 14:15 11/16/17 ([email or web address]) Performed at [facility], [place] [address] [place], [place] [address]    Culture KLEBSIELLA OXYTOCA (A)  Final   Report Status 11/18/2017 FINAL  Final   Organism ID, Bacteria KLEBSIELLA OXYTOCA  Final      Susceptibility   Klebsiella oxytoca - MIC*    AMPICILLIN >=32 RESISTANT Resistant     CEFAZOLIN <=4 SENSITIVE Sensitive     CEFEPIME <=1 SENSITIVE Sensitive     CEFTAZIDIME <=1 SENSITIVE Sensitive     CEFTRIAXONE <=1 SENSITIVE Sensitive     CIPROFLOXACIN <=0.25 SENSITIVE Sensitive     GENTAMICIN <=1 SENSITIVE Sensitive     IMIPENEM <=0.25 SENSITIVE Sensitive      TRIMETH/SULFA <=20 SENSITIVE Sensitive     AMPICILLIN/SULBACTAM 4 SENSITIVE Sensitive     PIP/TAZO <=4 SENSITIVE Sensitive     * KLEBSIELLA OXYTOCA  Blood Culture ID Panel (Reflexed)     Status: Abnormal   Collection Time: 11/15/17  9:01 PM  Result Value Ref Range Status   Enterococcus species NOT DETECTED NOT DETECTED Final   Listeria monocytogenes NOT DETECTED NOT DETECTED Final  Staphylococcus species NOT DETECTED NOT DETECTED Final   Staphylococcus aureus NOT DETECTED NOT DETECTED Final   Streptococcus species NOT DETECTED NOT DETECTED Final   Streptococcus agalactiae NOT DETECTED NOT DETECTED Final   Streptococcus pneumoniae NOT DETECTED NOT DETECTED Final   Streptococcus pyogenes NOT DETECTED NOT DETECTED Final   Acinetobacter baumannii NOT DETECTED NOT DETECTED Final   Enterobacteriaceae species DETECTED (A) NOT DETECTED Final    Comment: Enterobacteriaceae represent a large family of gram-negative bacteria, not a single organism. CRITICAL RESULT CALLED TO, READ BACK BY AND VERIFIED WITH: Sheffield Slider PharmD 14:15 11/16/17 ([email or web address])    Enterobacter cloacae complex NOT DETECTED NOT DETECTED Final   Escherichia coli NOT DETECTED NOT DETECTED Final   Klebsiella oxytoca DETECTED (A) NOT DETECTED Final    Comment: CRITICAL RESULT CALLED TO, READ BACK BY AND VERIFIED WITH: Sheffield Slider PharmD 14:15 11/16/17 ([email or web address])    Klebsiella pneumoniae NOT DETECTED NOT DETECTED Final   Proteus species NOT DETECTED NOT DETECTED Final   Serratia marcescens NOT DETECTED NOT DETECTED Final   Carbapenem resistance NOT DETECTED NOT DETECTED Final   Haemophilus influenzae NOT DETECTED NOT DETECTED Final   Neisseria meningitidis NOT DETECTED NOT DETECTED Final   Pseudomonas aeruginosa NOT DETECTED NOT DETECTED Final   Candida albicans NOT DETECTED NOT DETECTED Final   Candida glabrata NOT DETECTED NOT DETECTED Final   Candida krusei NOT DETECTED NOT DETECTED Final   Candida parapsilosis NOT  DETECTED NOT DETECTED Final   Candida tropicalis NOT DETECTED NOT DETECTED Final  Culture, blood (routine x 2) Call MD if unable to obtain prior to antibiotics being given     Status: Abnormal   Collection Time: 11/15/17  9:12 PM  Result Value Ref Range Status   Specimen Description   Final    BLOOD LEFT HAND Performed at [facility], [place] [address] [place], [place] [address]    Special Requests   Final    BOTTLES DRAWN AEROBIC AND ANAEROBIC Blood Culture adequate volume Performed at [facility] [address] [place], [place] [address]    Culture  Setup Time   Final    GRAM NEGATIVE RODS IN BOTH AEROBIC AND ANAEROBIC BOTTLES CRITICAL VALUE NOTED.  VALUE IS CONSISTENT WITH PREVIOUSLY REPORTED AND CALLED VALUE.    Culture (A)  Final    KLEBSIELLA OXYTOCA SUSCEPTIBILITIES PERFORMED ON PREVIOUS CULTURE WITHIN THE LAST 5 DAYS. Performed at [facility], [place] [address] [place], [place] [address]    Report Status 11/18/2017 FINAL  Final  Culture, Urine     Status: None   Collection Time: 11/15/17  9:40 PM  Result Value Ref Range Status   Specimen Description   Final    URINE, RANDOM Performed at [facility] [address] [place], [place] [address]    Special Requests   Final    NONE Performed at [facility], [place] [address] [place], [place] [address]    Culture   Final    NO GROWTH Performed at [facility], [place] [address], [place], [place] [address]    Report Status 11/17/2017 FINAL  Final  Culture, blood (routine x 2)     Status: None (Preliminary result)   Collection Time: 11/17/17 12:20 AM  Result Value Ref Range Status   Specimen Description   Final    BLOOD LEFT HAND Performed at [facility] [address], [place], [place] [address]    Special  Requests   Final    BOTTLES DRAWN AEROBIC AND ANAEROBIC Blood Culture adequate volume Performed at [facility] [address], [place], [place] [address]    Culture   Final    NO GROWTH 3 DAYS Performed at [facility], [place] [address], [place], [place] [address]    Report Status PENDING  Incomplete  Culture, blood (routine x 2)     Status: None (Preliminary result)   Collection Time: 11/17/17 12:20 AM  Result Value Ref Range Status   Specimen Description   Final    BLOOD LEFT ANTECUBITAL Performed at [facility] [address] [place], [place] [address]    Special Requests   Final    BOTTLES DRAWN AEROBIC AND ANAEROBIC Blood Culture adequate volume Performed at [place] [address], [place], [place] [address]    Culture   Final    NO GROWTH 3 DAYS Performed at [facility], [place] [address], [place], [place] [address]    Report Status PENDING  Incomplete    Studies/Results: Dg Chest Port 1 View  Result Date: 11/20/2017 CLINICAL DATA:  84 year old male with history of congestion. EXAM: PORTABLE CHEST 1 VIEW COMPARISON:  Chest x-ray 11/17/2017. FINDINGS: There is a right upper extremity PICC with tip terminating in the mid superior vena cava. Patchy multifocal interstitial and airspace disease asymmetrically distributed throughout the lungs bilaterally, most confluent throughout the right mid to upper lung. Improving aeration in the left base. No pleural effusions. No evidence of pulmonary edema. Heart size is mildly enlarged. Upper mediastinal contours are within normal limits. Aortic atherosclerosis. IMPRESSION: 1. Support apparatus, as above. 2. Patchy multifocal asymmetrically distributed interstitial and airspace disease in the lungs bilaterally, again compatible with multilobar bronchopneumonia. Slight improved aeration suggest positive response to therapy. 3. Aortic atherosclerosis. Electronically Signed   By: Vinnie Langton M.D.   On: 11/20/2017 10:29     Assessment/Plan: Aspiration PNA Klebsiella  bacteremia SBO (resolved)  Total days of antibiotics: 6 (5 ceftriaxone)  He is improving- temp better, wbc better.  Would aim for 10 days of ceftriaxone for bacteremia.  If fevers, recheck CXR, consider pulling PIC.  Available as needed.          Bobby Rumpf MD, FACP Infectious Diseases (pager) [phone] [email or web address] 11/20/2017, 1:12 PM  LOS: 14 days

## 2017-11-20 NOTE — Progress Notes (Signed)
PHARMACY - ADULT TOTAL PARENTERAL NUTRITION CONSULT NOTE   Pharmacy Consult for TPN Indication: small bowel obstruction  Patient Measurements: Height: _0  (188 cm) Weight: 197 lb 8.5 oz (89.6 kg) IBW/kg (Calculated) : 82.2 TPN AdjBW (KG): 88.9 Body mass index is 25.36 kg/m.  Insulin Requirements: 36 units via sliding scale/24 hours while on lantus 10 units SubQ qHS  Current Nutrition: TPN, advanced to clear liquid diet (minimal intake so far)  IVF: IVF d/c  Central access: PICC placed 7/9 TPN start date: 7/9  ASSESSMENT                                                                                                          HPI:  78 y/oM with PMH of HTN, remote esophageal cancer in remission, OSA, DM controlled with diet and exercise who presented to [facility] ED on 11/06/17 with abdominal pain, n/v and found to have small bowel obstruction. Abdominal film today c/w ongoing partial small bowel obstruction. Pharmacy consulted for TPN.   Significant events:  7/9-7/12 Reglan 5 mg q6h 7/10 Aspirated overnight, Vomit reportedly feculent smelling/appearing.  Transferred to ICU. 7/14 per surgery note, SBO has resolved. Advance to clear liquid diet. Bedside swallow evaluation done 7/14, recommends clears, small frequent meals. Diet recommendation thin liquids.  Today:   Glucose - CBGs 210-266 (Goal < 150), lantus 10 units SQ BID per MD.  Electrolytes - K 3.5 is low despite 4 runs given yesterday, others Na, Cl, Phos, Mg, corrected calcium are WNL.  Renal - SCr 0.83 (stable)  LFTs - AST/ALT, Alk Phos WNL. Tbili has decreased to WNL.  Prealbumin - 6.1 (7/9), < 5.0 today  Triglycerides - 102 (7/10), 124 today  NUTRITIONAL GOALS                                                                                       RD recs (7/9): Kcal:  2400-2600 kcal , Protein:  120-130 g  Clinimix 5/20 at a goal rate of 186m/hr + 20% fat emulsion at 282mhr x 12h to provide:  120 g/day protein, 2592  kcal/day.  PLAN  Now:  Potassium chloride 10 mEq IV x 5 runs  At 1800 today:  Advance Clinimix E 5/20 to 83 mL/hr - will hold off on advancing to goal rate since diet advanced  Continue 20% fat emulsion at 43m/hr x 12h.  Plan to advance as tolerated to the goal rate unless diet advances further  TPN to contain standard multivitamins and trace elements.  Continue famotidine 456min TPN bag.    Adjust SSI to resistant scale ACHS and continue Lantus 10 units SQ q12h per MD; add 10 units regular insulin to TPN.  IVF per MD  TPN lab panels in AM and on Mondays & Thursdays.  F/u daily.    ErPeggyann JubaPharmD, BCPS Pager: [phone]/15/19 7:06 AM

## 2017-11-20 NOTE — Progress Notes (Signed)
Pt. refuses CPAP at this time, stated that, "only uses chin strap at home for h/s and does not tolerate CPAP, RN at bedside made aware, placed extra pillows, RT/RN to monitor.

## 2017-11-20 NOTE — Progress Notes (Signed)
   11/20/17 1700  Clinical Encounter Type  Visited With Patient and family together  Visit Type Initial  Referral From Nurse  Advance Directives (For Healthcare)  Does Patient Have a Medical Advance Directive? Yes  Type of Paramedic of [place];Living will  Copy of [place] in Chart? Yes  Copy of Living Will in Chart? Yes    Completed adv directives with pt and spouse.

## 2017-11-20 NOTE — Progress Notes (Signed)
PROGRESS NOTE  Oscar [name] BBC:[id number] DOB: [birth year] DOA: 11/06/2017 PCP: Deland Pretty, MD  HPI/Recap of past 29 hours: 84 year old male with past medical history significant for hypertension, esophageal cancer status post esophagectomy in remission, presents to the ER complaining of abdominal pain, nausea, vomiting.  Patient was noted to have SBO.  Patient noted to have refused surgical intervention earlier during the admission, but now is agreeable.  Surgery is on board and currently managing conservatively.  Today, met pt sitting on recliner, reported feeling much better, saturating well on RA. No further fever/chills. Denies any worsening cough, abdominal pain, dysuria. Plan to D/C TPN/PICC line, once able to tolerate full liquid diet, likely 11/21/17  Assessment/Plan: Principal Problem:   SBO (small bowel obstruction) (HCC) Active Problems:   HTN (hypertension)   OSA (obstructive sleep apnea)   Acute on chronic respiratory failure with hypoxia (HCC)  Sepsis with Klebsiella bacteremia Likely source due to aspiration pna due to SBO Temp spike on 11/18/17, with resolving leukocytosis BC growing klebsiella oxytoca sensitive to ceftriaxone, repeat BC X 2 NGTD UA positive for RBC, UC with no growth Procalcitonin 52.49-->43.56-->18.21 CXR with worsening multifocal pneumonia, repeat shows some improvement Continue ceftriaxone ID on board, appreciate recs, no need to remove PICC line and can continue TPN, if another temp spike, will remove PICC and stop TPN SLP on board Will advance diet to FLD Monitor closely in ICU  Acute hypoxic respiratory distress Likely 2/2 aspiration pna due to SBO Currently on RA, saturating well, no increased WOB CXR showed worsening opacity, repeat shows some improvement Urine strep pneumo negative, legionella negative Continue ceftriaxone  Supplemental O2, incentive spirometry, duonebs  AKI Resolved Likely due to sepsis/bacteremia Daily  BMP  Small bowel obstruction Resolved as per surgery Serial abdominal x-ray revealed persistent SBO, unable to tolerate NG tube General surgery on board, managing conservatively for now, continue TPN Pharmacy to dose TPN, replace electrolytes accordingly Advance diet to FLD and plan to d/c TPN/PICC by 11/21/17 if tolerated  Electrolyte abnormalities Hypokalemia, hypomagnesemia Replace as needed  Hypertension Continue IV hydralazine prn, Lopressor, losartan/HCT  OSA Continue CPAP  Hyperglycemia Likely due to TPN SSI, lantus 10U BID, adjust prn   Code Status: Full  Family Communication: None at bedside  Disposition Plan: Once more stable   Consultants:  General surgery  ID  Procedures:  None  Antimicrobials:  IV Ceftriaxone  DVT prophylaxis: Enoxaparin   Objective: Vitals:   11/20/17 0800 11/20/17 1000 11/20/17 1007 11/20/17 1200  BP: (!) 160/72 (!) 173/81    Pulse:      Resp: 18 (!) 26 (!) 23   Temp: 97.9 F (36.6 C)   98 F (36.7 C)  TempSrc: Oral   Oral  SpO2: 100% 100% 100%   Weight:      Height:        Intake/Output Summary (Last 24 hours) at 11/20/2017 1625 Last data filed at 11/20/2017 1300 Gross per 24 hour  Intake 2267.34 ml  Output 970 ml  Net 1297.34 ml   Filed Weights   11/18/17 0500 11/19/17 0610 11/20/17 0530  Weight: 89.2 kg (196 lb 10.4 oz) 88.2 kg (194 lb 7.1 oz) 89.6 kg (197 lb 8.5 oz)    Exam:   General: NAD  Cardiovascular: S1, S2 present  Respiratory: Diminished BS bilaterally  Abdomen: Soft, nontender, nondistended, bowel sounds present  Musculoskeletal: No pedal edema bilaterally  Skin: Normal  Psychiatry: Normal mood   Data Reviewed: CBC: Recent Labs  Lab 11/15/17  2121 11/17/17 0519 11/18/17 0544 11/19/17 0442 11/20/17 0509  WBC 16.5* 26.4* 26.6* 23.2* 17.5*  NEUTROABS 14.6* 22.9 23.7* 19.7* 14.5*  HGB 10.4* 9.7* 9.4* 9.3* 8.9*  HCT 30.9* 28.4* 27.7* 27.3* 26.5*  MCV 96.9 96.9 96.5 95.8 96.4   PLT 241 234 265 302 582   Basic Metabolic Panel: Recent Labs  Lab 11/16/17 0516 11/17/17 0519 11/18/17 0544 11/19/17 0442 11/20/17 0509  NA 140 141 139 136 131*  K 4.0 3.2* 3.6 3.3* 3.5  CL 108 106 103 101 99  CO2 _0 GLUCOSE 189* 248* 297* 241* 226*  BUN _1 CREATININE 1.31* 1.00 0.98 0.88 0.83  CALCIUM 7.9* 7.9* 8.0* 7.9* 7.7*  MG 1.9 2.0 1.8 2.0 1.9  PHOS 3.5 2.4* 2.3* 3.0 3.4   GFR: Estimated Creatinine Clearance: 79.8 mL/min (by C-G formula based on SCr of 0.83 mg/dL). Liver Function Tests: Recent Labs  Lab 11/15/17 0500 11/15/17 2121 11/16/17 0516 11/19/17 0442 11/20/17 0509  AST 23 44* 36 43* 28  ALT _2 33 30  ALKPHOS 40 70 57 76 67  BILITOT 2.3* 2.8* 2.9* 1.1 0.7  PROT 5.1* 5.3* 5.5* 5.4* 5.7*  ALBUMIN 2.5* 2.5* 2.6* 2.2* 2.2*   No results for input(s): LIPASE, AMYLASE in the last 168 hours. No results for input(s): AMMONIA in the last 168 hours. Coagulation Profile: No results for input(s): INR, PROTIME in the last 168 hours. Cardiac Enzymes: No results for input(s): CKTOTAL, CKMB, CKMBINDEX, TROPONINI in the last 168 hours. BNP (last 3 results) No results for input(s): PROBNP in the last 8760 hours. HbA1C: No results for input(s): HGBA1C in the last 72 hours. CBG: Recent Labs  Lab 11/19/17 1628 11/19/17 1932 11/19/17 2339 11/20/17 0407 11/20/17 1142  GLUCAP 243* 224* 266* 187* 249*   Lipid Profile: Recent Labs    11/20/17 0509  TRIG 124   Thyroid Function Tests: No results for input(s): TSH, T4TOTAL, FREET4, T3FREE, THYROIDAB in the last 72 hours. Anemia Panel: No results for input(s): VITAMINB12, FOLATE, FERRITIN, TIBC, IRON, RETICCTPCT in the last 72 hours. Urine analysis:    Component Value Date/Time   COLORURINE AMBER (A) 11/15/2017 2140   APPEARANCEUR HAZY (A) 11/15/2017 2140   LABSPEC 1.011 11/15/2017 2140   PHURINE 5.0 11/15/2017 2140   GLUCOSEU NEGATIVE 11/15/2017 2140   HGBUR LARGE (A)  11/15/2017 2140   BILIRUBINUR NEGATIVE 11/15/2017 2140   KETONESUR NEGATIVE 11/15/2017 2140   PROTEINUR NEGATIVE 11/15/2017 2140   UROBILINOGEN 0.2 09/12/2014 1816   NITRITE NEGATIVE 11/15/2017 2140   LEUKOCYTESUR NEGATIVE 11/15/2017 2140   Sepsis Labs: _3 (procalcitonin:4,lacticidven:4)  ) Recent Results (from the past 240 hour(s))  MRSA PCR Screening     Status: None   Collection Time: 11/15/17  7:54 PM  Result Value Ref Range Status   MRSA by PCR NEGATIVE NEGATIVE Final    Comment:        The GeneXpert MRSA Assay (FDA approved for NASAL specimens only), is one component of a comprehensive MRSA colonization surveillance program. It is not intended to diagnose MRSA infection nor to guide or monitor treatment for MRSA infections. Performed at [facility], [place] [address] [place], [place] [address]   Culture, blood (routine x 2) Call MD if unable to obtain prior to antibiotics being given     Status: Abnormal   Collection Time: 11/15/17  9:01 PM  Result Value Ref Range Status   Specimen Description  Final    BLOOD LEFT ANTECUBITAL Performed at [place] [address] [place], [place] [address]    Special Requests   Final    BOTTLES DRAWN AEROBIC AND ANAEROBIC Blood Culture adequate volume Performed at [facility] [place] [address], [place], [place] [address]    Culture  Setup Time   Final    GRAM NEGATIVE RODS IN BOTH AEROBIC AND ANAEROBIC BOTTLES CRITICAL RESULT CALLED TO, READ BACK BY AND VERIFIED WITH: Sheffield Slider PharmD 14:15 11/16/17 ([email or web address]) Performed at [facility], [place] [address] [place], [place] [address]    Culture KLEBSIELLA OXYTOCA (A)  Final   Report Status 11/18/2017 FINAL  Final   Organism ID, Bacteria KLEBSIELLA OXYTOCA  Final      Susceptibility   Klebsiella oxytoca - MIC*    AMPICILLIN >=32 RESISTANT Resistant     CEFAZOLIN <=4 SENSITIVE Sensitive     CEFEPIME <=1  SENSITIVE Sensitive     CEFTAZIDIME <=1 SENSITIVE Sensitive     CEFTRIAXONE <=1 SENSITIVE Sensitive     CIPROFLOXACIN <=0.25 SENSITIVE Sensitive     GENTAMICIN <=1 SENSITIVE Sensitive     IMIPENEM <=0.25 SENSITIVE Sensitive     TRIMETH/SULFA <=20 SENSITIVE Sensitive     AMPICILLIN/SULBACTAM 4 SENSITIVE Sensitive     PIP/TAZO <=4 SENSITIVE Sensitive     * KLEBSIELLA OXYTOCA  Blood Culture ID Panel (Reflexed)     Status: Abnormal   Collection Time: 11/15/17  9:01 PM  Result Value Ref Range Status   Enterococcus species NOT DETECTED NOT DETECTED Final   Listeria monocytogenes NOT DETECTED NOT DETECTED Final   Staphylococcus species NOT DETECTED NOT DETECTED Final   Staphylococcus aureus NOT DETECTED NOT DETECTED Final   Streptococcus species NOT DETECTED NOT DETECTED Final   Streptococcus agalactiae NOT DETECTED NOT DETECTED Final   Streptococcus pneumoniae NOT DETECTED NOT DETECTED Final   Streptococcus pyogenes NOT DETECTED NOT DETECTED Final   Acinetobacter baumannii NOT DETECTED NOT DETECTED Final   Enterobacteriaceae species DETECTED (A) NOT DETECTED Final    Comment: Enterobacteriaceae represent a large family of gram-negative bacteria, not a single organism. CRITICAL RESULT CALLED TO, READ BACK BY AND VERIFIED WITH: Sheffield Slider PharmD 14:15 11/16/17 ([email or web address])    Enterobacter cloacae complex NOT DETECTED NOT DETECTED Final   Escherichia coli NOT DETECTED NOT DETECTED Final   Klebsiella oxytoca DETECTED (A) NOT DETECTED Final    Comment: CRITICAL RESULT CALLED TO, READ BACK BY AND VERIFIED WITH: Sheffield Slider PharmD 14:15 11/16/17 ([email or web address])    Klebsiella pneumoniae NOT DETECTED NOT DETECTED Final   Proteus species NOT DETECTED NOT DETECTED Final   Serratia marcescens NOT DETECTED NOT DETECTED Final   Carbapenem resistance NOT DETECTED NOT DETECTED Final   Haemophilus influenzae NOT DETECTED NOT DETECTED Final   Neisseria meningitidis NOT DETECTED NOT DETECTED Final    Pseudomonas aeruginosa NOT DETECTED NOT DETECTED Final   Candida albicans NOT DETECTED NOT DETECTED Final   Candida glabrata NOT DETECTED NOT DETECTED Final   Candida krusei NOT DETECTED NOT DETECTED Final   Candida parapsilosis NOT DETECTED NOT DETECTED Final   Candida tropicalis NOT DETECTED NOT DETECTED Final  Culture, blood (routine x 2) Call MD if unable to obtain prior to antibiotics being given     Status: Abnormal   Collection Time: 11/15/17  9:12 PM  Result Value Ref Range Status   Specimen Description   Final    BLOOD LEFT HAND Performed at [facility],  [place] [address] [place], [place] [address]    Special Requests   Final    BOTTLES DRAWN AEROBIC AND ANAEROBIC Blood Culture adequate volume Performed at [place] [address], [place], [place] [address]    Culture  Setup Time   Final    GRAM NEGATIVE RODS IN BOTH AEROBIC AND ANAEROBIC BOTTLES CRITICAL VALUE NOTED.  VALUE IS CONSISTENT WITH PREVIOUSLY REPORTED AND CALLED VALUE.    Culture (A)  Final    KLEBSIELLA OXYTOCA SUSCEPTIBILITIES PERFORMED ON PREVIOUS CULTURE WITHIN THE LAST 5 DAYS. Performed at [facility], [place] [address] [place], [place] [address]    Report Status 11/18/2017 FINAL  Final  Culture, Urine     Status: None   Collection Time: 11/15/17  9:40 PM  Result Value Ref Range Status   Specimen Description   Final    URINE, RANDOM Performed at [place] [address], [place], [place] [address]    Special Requests   Final    NONE Performed at [facility], [place] [address], [place], [place] [address]    Culture   Final    NO GROWTH Performed at [facility], [place] [address], [place], [place] [address]    Report Status 11/17/2017 FINAL  Final  Culture, blood (routine x 2)     Status: None (Preliminary result)   Collection Time: 11/17/17 12:20 AM  Result Value Ref Range Status   Specimen Description    Final    BLOOD LEFT HAND Performed at [place] [address], [place], [place] [address]    Special Requests   Final    BOTTLES DRAWN AEROBIC AND ANAEROBIC Blood Culture adequate volume Performed at [place] [place] [address] [place], [place] [address]    Culture   Final    NO GROWTH 3 DAYS Performed at [facility], [place] [address], [place], [place] [address]    Report Status PENDING  Incomplete  Culture, blood (routine x 2)     Status: None (Preliminary result)   Collection Time: 11/17/17 12:20 AM  Result Value Ref Range Status   Specimen Description   Final    BLOOD LEFT ANTECUBITAL Performed at [facility] [address], [place], [place] [address]    Special Requests   Final    BOTTLES DRAWN AEROBIC AND ANAEROBIC Blood Culture adequate volume Performed at [facility] [address], [place], [place] [address]    Culture   Final    NO GROWTH 3 DAYS Performed at [facility], [place] [address] [place], [place] [address]    Report Status PENDING  Incomplete      Studies: Dg Chest Port 1 View  Result Date: 11/20/2017 CLINICAL DATA:  84 year old male with history of congestion. EXAM: PORTABLE CHEST 1 VIEW COMPARISON:  Chest x-ray 11/17/2017. FINDINGS: There is a right upper extremity PICC with tip terminating in the mid superior vena cava. Patchy multifocal interstitial and airspace disease asymmetrically distributed throughout the lungs bilaterally, most confluent throughout the right mid to upper lung. Improving aeration in the left base. No pleural effusions. No evidence of pulmonary edema. Heart size is mildly enlarged. Upper mediastinal contours are within normal limits. Aortic atherosclerosis. IMPRESSION: 1. Support apparatus, as above. 2. Patchy multifocal asymmetrically distributed interstitial and airspace disease in the lungs bilaterally, again compatible with multilobar  bronchopneumonia. Slight improved aeration suggest positive response  to therapy. 3. Aortic atherosclerosis. Electronically Signed   By: Vinnie Langton M.D.   On: 11/20/2017 10:29    Scheduled Meds: . Chlorhexidine Gluconate Cloth  6 each Topical Daily  . enoxaparin (LOVENOX) injection  40 mg Subcutaneous Daily  . losartan  50 mg Oral Daily   And  . hydrochlorothiazide  6.25 mg Oral Daily  . insulin aspart  0-20 Units Subcutaneous TID WC  . insulin aspart  0-5 Units Subcutaneous QHS  . insulin glargine  10 Units Subcutaneous BID  . mouth rinse  15 mL Mouth Rinse BID  . metoprolol tartrate  10 mg Intravenous Q6H    Continuous Infusions: . [name] KitchenTPN (CLINIMIX-E) Adult 70 mL/hr at 11/20/17 1000  . [name] KitchenTPN (CLINIMIX-E) Adult     And  . Fat emulsion    . cefTRIAXone (ROCEPHIN)  IV Stopped (11/19/17 2127)     LOS: 14 days     Alma Friendly, MD [facility]   If 7PM-7AM, please contact night-coverage [email or web address] Password [facility] 11/20/2017, 4:25 PM

## 2017-11-20 NOTE — Progress Notes (Signed)
Subjective/Chief Complaint: Continues to have flatus and BMs. Denies n/v. Denies any abdominal pain.   Objective: Vital signs in last 24 hours: Temp:  [97.3 F (36.3 C)-99.5 F (37.5 C)] 98.7 F (37.1 C) (07/15 0400) Pulse Rate:  [104] 104 (07/14 2340) Resp:  [15-35] 18 (07/15 0800) BP: (142-210)/(47-81) 160/72 (07/15 0800) SpO2:  [98 %-100 %] 100 % (07/15 0800) Weight:  [89.6 kg (197 lb 8.5 oz)] 89.6 kg (197 lb 8.5 oz) (07/15 0530) Last BM Date: 11/20/17  Intake/Output from previous day: 07/14 0701 - 07/15 0700 In: 2159.8 [I.V.:1910.7; IV Piggyback:249.2] Out: 1100 [Urine:1100] Intake/Output this shift: No intake/output data recorded.  Gen: NAD, comfortable, sitting in chair CV: RRR Pulm: Normal work of breathing GI: abdomen is soft nontender nondistended   Lab Results:  Recent Labs    11/19/17 0442 11/20/17 0509  WBC 23.2* 17.5*  HGB 9.3* 8.9*  HCT 27.3* 26.5*  PLT 302 353   BMET Recent Labs    11/19/17 0442 11/20/17 0509  NA 136 131*  K 3.3* 3.5  CL 101 99  CO2 27 25  GLUCOSE 241* 226*  BUN 16 17  CREATININE 0.88 0.83  CALCIUM 7.9* 7.7*   PT/INR No results for input(s): LABPROT, INR in the last 72 hours. ABG No results for input(s): PHART, HCO3 in the last 72 hours.  Invalid input(s): PCO2, PO2  Studies/Results: No results found.  Anti-infectives: Anti-infectives (From admission, onward)   Start     Dose/Rate Route Frequency Ordered Stop   11/18/17 0800  vancomycin (VANCOCIN) IVPB 750 mg/150 ml premix  Status:  Discontinued     750 mg 150 mL/hr over 60 Minutes Intravenous Every 12 hours 11/17/17 1642 11/17/17 1805   11/17/17 1830  cefTRIAXone (ROCEPHIN) 2 g in sodium chloride 0.9 % 100 mL IVPB     2 g 200 mL/hr over 30 Minutes Intravenous Every 24 hours 11/17/17 1805     11/17/17 1800  vancomycin (VANCOCIN) 1,500 mg in sodium chloride 0.9 % 500 mL IVPB  Status:  Discontinued     1,500 mg 250 mL/hr over 120 Minutes Intravenous  Once  11/17/17 1642 11/17/17 1805   11/17/17 1800  meropenem (MERREM) 1 g in sodium chloride 0.9 % 100 mL IVPB  Status:  Discontinued     1 g 200 mL/hr over 30 Minutes Intravenous Every 8 hours 11/17/17 1647 11/17/17 1805   11/16/17 2200  cefTRIAXone (ROCEPHIN) 2 g in sodium chloride 0.9 % 100 mL IVPB  Status:  Discontinued     2 g 200 mL/hr over 30 Minutes Intravenous Every 24 hours 11/16/17 1447 11/17/17 1609   11/16/17 2100  vancomycin (VANCOCIN) 1,500 mg in sodium chloride 0.9 % 500 mL IVPB  Status:  Discontinued     1,500 mg 250 mL/hr over 120 Minutes Intravenous Every 24 hours 11/15/17 1951 11/16/17 1131   11/15/17 2200  piperacillin-tazobactam (ZOSYN) IVPB 3.375 g  Status:  Discontinued     3.375 g 12.5 mL/hr over 240 Minutes Intravenous Every 8 hours 11/15/17 2137 11/16/17 1447   11/15/17 2030  vancomycin (VANCOCIN) 1,750 mg in sodium chloride 0.9 % 500 mL IVPB     1,750 mg 250 mL/hr over 120 Minutes Intravenous  Once 11/15/17 1948 11/15/17 2330   11/15/17 2000  ceFEPIme (MAXIPIME) 1 g in sodium chloride 0.9 % 100 mL IVPB  Status:  Discontinued     1 g 200 mL/hr over 30 Minutes Intravenous Every 8 hours 11/15/17 1932 11/15/17 2132  11/08/17 1200  fluconazole (DIFLUCAN) IVPB 100 mg  Status:  Discontinued     100 mg 50 mL/hr over 60 Minutes Intravenous Every 24 hours 11/08/17 1032 11/10/17 0934      Assessment/Plan: SBO- admitted 11/06/17 -this has resolved clinically and radiologically at this point -he can have diet advanced per medicine this is dependent on his asp pna AspirationPNA- per medicine and id Malnutritionrelated to chronic illness- Prealbumin 6.1, TPN started yesterday  [facility] 11/20/2017

## 2017-11-21 LAB — CBC WITH DIFFERENTIAL/PLATELET
BASOS PCT: 0 %
Basophils Absolute: 0 10*3/uL (ref 0.0–0.1)
EOS ABS: 0.2 10*3/uL (ref 0.0–0.7)
Eosinophils Relative: 2 %
HEMATOCRIT: 26.3 % — AB (ref 39.0–52.0)
HEMOGLOBIN: 8.9 g/dL — AB (ref 13.0–17.0)
LYMPHS ABS: 1 10*3/uL (ref 0.7–4.0)
Lymphocytes Relative: 10 %
MCH: 32.6 pg (ref 26.0–34.0)
MCHC: 33.8 g/dL (ref 30.0–36.0)
MCV: 96.3 fL (ref 78.0–100.0)
MONO ABS: 1.4 10*3/uL — AB (ref 0.1–1.0)
Monocytes Relative: 13 %
NEUTROS PCT: 75 %
Neutro Abs: 7.8 10*3/uL — ABNORMAL HIGH (ref 1.7–7.7)
Platelets: 412 10*3/uL — ABNORMAL HIGH (ref 150–400)
RBC: 2.73 MIL/uL — ABNORMAL LOW (ref 4.22–5.81)
RDW: 13.6 % (ref 11.5–15.5)
WBC: 10.4 10*3/uL (ref 4.0–10.5)

## 2017-11-21 LAB — BASIC METABOLIC PANEL
Anion gap: 9 (ref 5–15)
BUN: 16 mg/dL (ref 8–23)
CALCIUM: 8.1 mg/dL — AB (ref 8.9–10.3)
CHLORIDE: 100 mmol/L (ref 98–111)
CO2: 25 mmol/L (ref 22–32)
Creatinine, Ser: 0.88 mg/dL (ref 0.61–1.24)
GFR calc non Af Amer: >60 mL/min (ref >60)
Glucose, Bld: 224 mg/dL — ABNORMAL HIGH (ref 70–99)
Potassium: 3.8 mmol/L (ref 3.5–5.1)
Sodium: 134 mmol/L — ABNORMAL LOW (ref 135–145)

## 2017-11-21 LAB — GLUCOSE, CAPILLARY
GLUCOSE-CAPILLARY: 197 mg/dL — AB (ref 70–99)
GLUCOSE-CAPILLARY: 139 mg/dL — AB (ref 70–99)
GLUCOSE-CAPILLARY: 69 mg/dL — AB (ref 70–99)
Glucose-Capillary: 68 mg/dL — ABNORMAL LOW (ref 70–99)
Glucose-Capillary: 79 mg/dL (ref 70–99)
Glucose-Capillary: 80 mg/dL (ref 70–99)

## 2017-11-21 MED ORDER — HYDROCHLOROTHIAZIDE 10 MG/ML ORAL SUSPENSION
12.5000 mg | Freq: Every day | ORAL | Status: DC
  Administered 2017-11-21: 13 mg via ORAL
  Filled 2017-11-21: qty 1.88

## 2017-11-21 MED ORDER — ATENOLOL 25 MG PO TABS
25.0000 mg | ORAL_TABLET | Freq: Every day | ORAL | Status: DC
  Administered 2017-11-21 – 2017-11-23 (×3): 25 mg via ORAL
  Filled 2017-11-21 (×3): qty 1

## 2017-11-21 MED ORDER — LOSARTAN POTASSIUM 50 MG PO TABS
100.0000 mg | ORAL_TABLET | Freq: Every day | ORAL | Status: DC
  Administered 2017-11-21 – 2017-11-23 (×3): 100 mg via ORAL
  Filled 2017-11-21 (×3): qty 2

## 2017-11-21 MED ORDER — HYDRALAZINE HCL 20 MG/ML IJ SOLN: 10.0000 mg | Freq: Four times a day (QID) | INTRAMUSCULAR | Status: DC | PRN

## 2017-11-21 MED ORDER — HYDROCHLOROTHIAZIDE 12.5 MG PO CAPS
12.5000 mg | ORAL_CAPSULE | Freq: Every day | ORAL | Status: DC
  Administered 2017-11-22 – 2017-11-23 (×2): 12.5 mg via ORAL
  Filled 2017-11-21 (×2): qty 1

## 2017-11-21 MED ORDER — PANTOPRAZOLE SODIUM 40 MG PO TBEC
80.0000 mg | DELAYED_RELEASE_TABLET | Freq: Every day | ORAL | Status: DC
  Administered 2017-11-22 – 2017-11-23 (×2): 80 mg via ORAL
  Filled 2017-11-21 (×2): qty 2

## 2017-11-21 NOTE — Progress Notes (Signed)
Subjective/Chief Complaint: Continues to have flatus and BMs. Denies n/v. Denies any abdominal pain. Tolerating full liquids without issue   Objective: Vital signs in last 24 hours: Temp:  [97.8 F (36.6 C)-98.2 F (36.8 C)] 98 F (36.7 C) (07/16 0400) Resp:  [17-29] 26 (07/16 0500) BP: (160-207)/(64-117) 164/100 (07/16 0400) SpO2:  [95 %-100 %] 97 % (07/16 0500) Weight:  [90 kg (198 lb 6.6 oz)] 90 kg (198 lb 6.6 oz) (07/16 0500) Last BM Date: 11/20/17  Intake/Output from previous day: 07/15 0701 - 07/16 0700 In: 2591.5 [P.O.:500; I.V.:1988.2; IV Piggyback:103.3] Out: 1640 [Urine:1640] Intake/Output this shift: No intake/output data recorded.  Gen: NAD, comfortable, sitting in chair CV: RRR Pulm: Normal work of breathing GI: abdomen is soft nontender nondistended   Lab Results:  Recent Labs    11/20/17 0509 11/21/17 0527  WBC 17.5* 10.4  HGB 8.9* 8.9*  HCT 26.5* 26.3*  PLT 353 412*   BMET Recent Labs    11/20/17 0509 11/21/17 0527  NA 131* 134*  K 3.5 3.8  CL 99 100  CO2 25 25  GLUCOSE 226* 224*  BUN 17 16  CREATININE 0.83 0.88  CALCIUM 7.7* 8.1*   PT/INR No results for input(s): LABPROT, INR in the last 72 hours. ABG No results for input(s): PHART, HCO3 in the last 72 hours.  Invalid input(s): PCO2, PO2  Studies/Results: Dg Chest Port 1 View  Result Date: 11/20/2017 CLINICAL DATA:  84 year old male with history of congestion. EXAM: PORTABLE CHEST 1 VIEW COMPARISON:  Chest x-ray 11/17/2017. FINDINGS: There is a right upper extremity PICC with tip terminating in the mid superior vena cava. Patchy multifocal interstitial and airspace disease asymmetrically distributed throughout the lungs bilaterally, most confluent throughout the right mid to upper lung. Improving aeration in the left base. No pleural effusions. No evidence of pulmonary edema. Heart size is mildly enlarged. Upper mediastinal contours are within normal limits. Aortic atherosclerosis.  IMPRESSION: 1. Support apparatus, as above. 2. Patchy multifocal asymmetrically distributed interstitial and airspace disease in the lungs bilaterally, again compatible with multilobar bronchopneumonia. Slight improved aeration suggest positive response to therapy. 3. Aortic atherosclerosis. Electronically Signed   By: Vinnie Langton M.D.   On: 11/20/2017 10:29    Anti-infectives: Anti-infectives (From admission, onward)   Start     Dose/Rate Route Frequency Ordered Stop   11/18/17 0800  vancomycin (VANCOCIN) IVPB 750 mg/150 ml premix  Status:  Discontinued     750 mg 150 mL/hr over 60 Minutes Intravenous Every 12 hours 11/17/17 1642 11/17/17 1805   11/17/17 1830  cefTRIAXone (ROCEPHIN) 2 g in sodium chloride 0.9 % 100 mL IVPB     2 g 200 mL/hr over 30 Minutes Intravenous Every 24 hours 11/17/17 1805     11/17/17 1800  vancomycin (VANCOCIN) 1,500 mg in sodium chloride 0.9 % 500 mL IVPB  Status:  Discontinued     1,500 mg 250 mL/hr over 120 Minutes Intravenous  Once 11/17/17 1642 11/17/17 1805   11/17/17 1800  meropenem (MERREM) 1 g in sodium chloride 0.9 % 100 mL IVPB  Status:  Discontinued     1 g 200 mL/hr over 30 Minutes Intravenous Every 8 hours 11/17/17 1647 11/17/17 1805   11/16/17 2200  cefTRIAXone (ROCEPHIN) 2 g in sodium chloride 0.9 % 100 mL IVPB  Status:  Discontinued     2 g 200 mL/hr over 30 Minutes Intravenous Every 24 hours 11/16/17 1447 11/17/17 1609   11/16/17 2100  vancomycin (VANCOCIN) 1,500 mg in  sodium chloride 0.9 % 500 mL IVPB  Status:  Discontinued     1,500 mg 250 mL/hr over 120 Minutes Intravenous Every 24 hours 11/15/17 1951 11/16/17 1131   11/15/17 2200  piperacillin-tazobactam (ZOSYN) IVPB 3.375 g  Status:  Discontinued     3.375 g 12.5 mL/hr over 240 Minutes Intravenous Every 8 hours 11/15/17 2137 11/16/17 1447   11/15/17 2030  vancomycin (VANCOCIN) 1,750 mg in sodium chloride 0.9 % 500 mL IVPB     1,750 mg 250 mL/hr over 120 Minutes Intravenous  Once  11/15/17 1948 11/15/17 2330   11/15/17 2000  ceFEPIme (MAXIPIME) 1 g in sodium chloride 0.9 % 100 mL IVPB  Status:  Discontinued     1 g 200 mL/hr over 30 Minutes Intravenous Every 8 hours 11/15/17 1932 11/15/17 2132   11/08/17 1200  fluconazole (DIFLUCAN) IVPB 100 mg  Status:  Discontinued     100 mg 50 mL/hr over 60 Minutes Intravenous Every 24 hours 11/08/17 1032 11/10/17 0934      Assessment/Plan: SBO- admitted 11/06/17 -this has resolved clinically and radiologically at this point -he can have diet advanced per medicine this is dependent on his asp pna. [place] for regular/soft diet from out standpoint AspirationPNA- per medicine and id Malnutritionrelated to chronic illness- Prealbumin 6.1, TPN started yesterday  Ileana Roup 11/21/2017

## 2017-11-21 NOTE — Progress Notes (Signed)
Patient Up to chair. Chair alarm under patient. Will continue to monitor patient.

## 2017-11-21 NOTE — Progress Notes (Signed)
PHARMACY - ADULT TOTAL PARENTERAL NUTRITION CONSULT NOTE   Pharmacy Consult for TPN Indication: small bowel obstruction  Patient Measurements: Height: '6\' 2"'  (188 cm) Weight: 198 lb 6.6 oz (90 kg) IBW/kg (Calculated) : 82.2 TPN AdjBW (KG): 88.9 Body mass index is 25.47 kg/m.  Insulin Requirements: 19 units via sliding scale/24 hours while on lantus 10 units SQ q12h, 10 units regular insulin in TPN added 7/15  Current Nutrition: TPN, advanced to full liquid diet 7/15  IVF: IVF d/c  Central access: PICC placed 7/9 TPN start date: 7/9  ASSESSMENT                                                                                                          HPI:  61 y/oM with PMH of HTN, remote esophageal cancer in remission, OSA, DM controlled with diet and exercise who presented to [facility] ED on 11/06/17 with abdominal pain, n/v and found to have small bowel obstruction. Abdominal film today c/w ongoing partial small bowel obstruction. Pharmacy consulted for TPN.   Significant events:  7/9-7/12 Reglan 5 mg q6h 7/10 Aspirated overnight, Vomit reportedly feculent smelling/appearing.  Transferred to ICU. 7/14 Per surgery note, SBO has resolved clinically and radiologically. Advance to clear liquid diet. Bedside swallow evaluation done 7/14, recommends clears, small frequent meals. Diet recommendation thin liquids. 7/15 Per surgery note, having BMs and flatus. Advanced to full liquid diet, tolerating without abdominal pain. 7/16 Tolerating full liquids, ok to wean off TPN today per discussion with Dr. Horris Latino  Today:   Glucose - CBGs 175-197 (Goal < 150), improved with addition of insulin to TPN last night. lantus 10 units SQ BID per MD.  Electrolytes - K 3.8 improved after 5 runs given yesterday, others Na, Cl, Phos, Mg, corrected calcium are WNL.  Renal - SCr 0.88 (stable)  LFTs - AST/ALT, Alk Phos WNL. Tbili has decreased to WNL.  Prealbumin - 6.1 (7/9), < 5.0 (7/15)  Triglycerides - 102  (7/10), 124 (7/15)  NUTRITIONAL GOALS                                                                                       RD recs (7/9): Kcal:  2400-2600 kcal , Protein:  120-130 g  Clinimix 5/20 at a goal rate of 165m/hr + 20% fat emulsion at 26mhr x 12h to provide:  120 g/day protein, 2592 kcal/day.  PLAN  Decrease TPN to 63m/hr now, then stop in 2 hrs DC TPN labs TPN contained pepcid, will resume home PPI Lantus/SSI adjustments per MD  EPeggyann Juba PharmD, BCPS Pager: [phone]/16/19 11:13 AM

## 2017-11-21 NOTE — Progress Notes (Signed)
Bladder scan pt. Due to uncomfortable pressure in lower abdomen near bladder. Pt. Has 600cc urine in bladder. MD paged. Awaiting orders.

## 2017-11-21 NOTE — Progress Notes (Signed)
PROGRESS NOTE  Oscar [name] QIW:[id number] DOB: [birth year] DOA: 11/06/2017 PCP: Deland Pretty, MD  HPI/Recap of past 8 hours: 84 year old male with past medical history significant for hypertension, esophageal cancer status post esophagectomy in remission, presents to the ER complaining of abdominal pain, nausea, vomiting.  Patient was noted to have SBO.  Patient noted to have refused surgical intervention earlier during the admission, but now is agreeable.  Surgery is on board and currently managing conservatively.  Today, pt reported feeling much better, saturating well on RA. Tolerated soft diet, without any complaints. No further fever/chills. Denies any worsening cough, abdominal pain, dysuria. D/C TPN/PICC line today. This evening, noted to have some hypertensive crisis. Plan to transfer him to med-surg on 11/22/17  Assessment/Plan: Principal Problem:   SBO (small bowel obstruction) (HCC) Active Problems:   HTN (hypertension)   OSA (obstructive sleep apnea)   Acute on chronic respiratory failure with hypoxia (HCC)  Sepsis with Klebsiella bacteremia Likely source due to aspiration pna due to SBO Temp spike on 11/18/17, with resolved leukocytosis BC growing klebsiella oxytoca sensitive to ceftriaxone, repeat BC X 2 NGTD UA positive for RBC, UC with no growth Procalcitonin 52.49-->43.56-->18.21 CXR with worsening multifocal pneumonia, repeat shows some improvement Continue ceftriaxone for about 10-14 days ID on board, appreciate recs SLP on board Advance to soft diet May transfer out of SDU on 11/22/17  Acute hypoxic respiratory distress Likely 2/2 aspiration pna due to SBO Currently on RA, saturating well, no increased WOB CXR showed worsening opacity, repeat showed some improvement Urine strep pneumo negative, legionella negative Continue ceftriaxone  Supplemental O2, incentive spirometry, duonebs  AKI Resolved Likely due to sepsis/bacteremia Daily BMP  Small bowel  obstruction Resolved as per surgery Serial abdominal x-ray revealed SBO has resolved General surgery on board, managed conservatively D/C TPN/PICC line on 11/21/17 Advanced diet  Electrolyte abnormalities Hypokalemia, hypomagnesemia Replace as needed  Hypertension Continue IV hydralazine prn, losartan/HCT, atenolol  OSA Continue CPAP  Hyperglycemia Likely due to TPN, d/c SSI, adjust prn   Code Status: Full  Family Communication: None at bedside  Disposition Plan: Once more stable   Consultants:  General surgery  ID  Procedures:  None  Antimicrobials:  IV Ceftriaxone  DVT prophylaxis: Enoxaparin   Objective: Vitals:   11/21/17 1603 11/21/17 1620 11/21/17 1700 11/21/17 1706  BP: (!) 200/106 (!) 181/73    Pulse:    97  Resp:  18 20   Temp:      TempSrc:      SpO2:  97% 97%   Weight:      Height:        Intake/Output Summary (Last 24 hours) at 11/21/2017 1715 Last data filed at 11/21/2017 1436 Gross per 24 hour  Intake 2549.49 ml  Output 2070 ml  Net 479.49 ml   Filed Weights   11/20/17 0530 11/21/17 0500 11/21/17 0935  Weight: 89.6 kg (197 lb 8.5 oz) 90 kg (198 lb 6.6 oz) 90 kg (198 lb 6.6 oz)    Exam:   General: NAD  Cardiovascular: S1, S2 present  Respiratory: Diminished BS bilaterally  Abdomen: Soft, nontender, nondistended, bowel sounds present  Musculoskeletal: No pedal edema bilaterally  Skin: Normal  Psychiatry: Normal mood   Data Reviewed: CBC: Recent Labs  Lab 11/17/17 0519 11/18/17 0544 11/19/17 0442 11/20/17 0509 11/21/17 0527  WBC 26.4* 26.6* 23.2* 17.5* 10.4  NEUTROABS 22.9 23.7* 19.7* 14.5* 7.8*  HGB 9.7* 9.4* 9.3* 8.9* 8.9*  HCT 28.4* 27.7* 27.3* 26.5* 26.3*  MCV 96.9 96.5 95.8 96.4 96.3  PLT 234 265 302 353 269*   Basic Metabolic Panel: Recent Labs  Lab 11/16/17 0516 11/17/17 0519 11/18/17 0544 11/19/17 0442 11/20/17 0509 11/21/17 0527  NA 140 141 139 136 131* 134*  K 4.0 3.2* 3.6 3.3* 3.5  3.8  CL 108 106 103 101 99 100  CO2 23 29 28 27 25 25   GLUCOSE 189* 248* 297* 241* 226* 224*  BUN 22 19 16 16 17 16   CREATININE 1.31* 1.00 0.98 0.88 0.83 0.88  CALCIUM 7.9* 7.9* 8.0* 7.9* 7.7* 8.1*  MG 1.9 2.0 1.8 2.0 1.9  --   PHOS 3.5 2.4* 2.3* 3.0 3.4  --    GFR: Estimated Creatinine Clearance: 75.2 mL/min (by C-G formula based on SCr of 0.88 mg/dL). Liver Function Tests: Recent Labs  Lab 11/15/17 0500 11/15/17 2121 11/16/17 0516 11/19/17 0442 11/20/17 0509  AST 23 44* 36 43* 28  ALT 19 29 27  33 30  ALKPHOS 40 70 57 76 67  BILITOT 2.3* 2.8* 2.9* 1.1 0.7  PROT 5.1* 5.3* 5.5* 5.4* 5.7*  ALBUMIN 2.5* 2.5* 2.6* 2.2* 2.2*   No results for input(s): LIPASE, AMYLASE in the last 168 hours. No results for input(s): AMMONIA in the last 168 hours. Coagulation Profile: No results for input(s): INR, PROTIME in the last 168 hours. Cardiac Enzymes: No results for input(s): CKTOTAL, CKMB, CKMBINDEX, TROPONINI in the last 168 hours. BNP (last 3 results) No results for input(s): PROBNP in the last 8760 hours. HbA1C: No results for input(s): HGBA1C in the last 72 hours. CBG: Recent Labs  Lab 11/20/17 1744 11/20/17 2236 11/21/17 0814 11/21/17 1131 11/21/17 1625  GLUCAP 143* 175* 197* 139* 80   Lipid Profile: Recent Labs    11/20/17 0509  TRIG 124   Thyroid Function Tests: No results for input(s): TSH, T4TOTAL, FREET4, T3FREE, THYROIDAB in the last 72 hours. Anemia Panel: No results for input(s): VITAMINB12, FOLATE, FERRITIN, TIBC, IRON, RETICCTPCT in the last 72 hours. Urine analysis:    Component Value Date/Time   COLORURINE AMBER (A) 11/15/2017 2140   APPEARANCEUR HAZY (A) 11/15/2017 2140   LABSPEC 1.011 11/15/2017 2140   PHURINE 5.0 11/15/2017 2140   GLUCOSEU NEGATIVE 11/15/2017 2140   HGBUR LARGE (A) 11/15/2017 2140   BILIRUBINUR NEGATIVE 11/15/2017 2140   KETONESUR NEGATIVE 11/15/2017 2140   PROTEINUR NEGATIVE 11/15/2017 2140   UROBILINOGEN 0.2 09/12/2014 1816    NITRITE NEGATIVE 11/15/2017 2140   LEUKOCYTESUR NEGATIVE 11/15/2017 2140   Sepsis Labs: @LABRCNTIP (procalcitonin:4,lacticidven:4)  ) Recent Results (from the past 240 hour(s))  MRSA PCR Screening     Status: None   Collection Time: 11/15/17  7:54 PM  Result Value Ref Range Status   MRSA by PCR NEGATIVE NEGATIVE Final    Comment:        The GeneXpert MRSA Assay (FDA approved for NASAL specimens only), is one component of a comprehensive MRSA colonization surveillance program. It is not intended to diagnose MRSA infection nor to guide or monitor treatment for MRSA infections. Performed at [facility], [place] [address], [place], [place] [address]   Culture, blood (routine x 2) Call MD if unable to obtain prior to antibiotics being given     Status: Abnormal   Collection Time: 11/15/17  9:01 PM  Result Value Ref Range Status   Specimen Description   Final    BLOOD LEFT ANTECUBITAL Performed at [place] [address] [place], [place] [address]  Special Requests   Final    BOTTLES DRAWN AEROBIC AND ANAEROBIC Blood Culture adequate volume Performed at [place] [address], [place], [place] [address]    Culture  Setup Time   Final    GRAM NEGATIVE RODS IN BOTH AEROBIC AND ANAEROBIC BOTTLES CRITICAL RESULT CALLED TO, READ BACK BY AND VERIFIED WITH: Sheffield Slider PharmD 14:15 11/16/17 ([email or web address]) Performed at [facility], [place] [address] [place], [place] [address]    Culture KLEBSIELLA OXYTOCA (A)  Final   Report Status 11/18/2017 FINAL  Final   Organism ID, Bacteria KLEBSIELLA OXYTOCA  Final      Susceptibility   Klebsiella oxytoca - MIC*    AMPICILLIN >=32 RESISTANT Resistant     CEFAZOLIN <=4 SENSITIVE Sensitive     CEFEPIME <=1 SENSITIVE Sensitive     CEFTAZIDIME <=1 SENSITIVE Sensitive     CEFTRIAXONE <=1 SENSITIVE Sensitive     CIPROFLOXACIN <=0.25 SENSITIVE Sensitive     GENTAMICIN <=1  SENSITIVE Sensitive     IMIPENEM <=0.25 SENSITIVE Sensitive     TRIMETH/SULFA <=20 SENSITIVE Sensitive     AMPICILLIN/SULBACTAM 4 SENSITIVE Sensitive     PIP/TAZO <=4 SENSITIVE Sensitive     * KLEBSIELLA OXYTOCA  Blood Culture ID Panel (Reflexed)     Status: Abnormal   Collection Time: 11/15/17  9:01 PM  Result Value Ref Range Status   Enterococcus species NOT DETECTED NOT DETECTED Final   Listeria monocytogenes NOT DETECTED NOT DETECTED Final   Staphylococcus species NOT DETECTED NOT DETECTED Final   Staphylococcus aureus NOT DETECTED NOT DETECTED Final   Streptococcus species NOT DETECTED NOT DETECTED Final   Streptococcus agalactiae NOT DETECTED NOT DETECTED Final   Streptococcus pneumoniae NOT DETECTED NOT DETECTED Final   Streptococcus pyogenes NOT DETECTED NOT DETECTED Final   Acinetobacter baumannii NOT DETECTED NOT DETECTED Final   Enterobacteriaceae species DETECTED (A) NOT DETECTED Final    Comment: Enterobacteriaceae represent a large family of gram-negative bacteria, not a single organism. CRITICAL RESULT CALLED TO, READ BACK BY AND VERIFIED WITH: Sheffield Slider PharmD 14:15 11/16/17 ([email or web address])    Enterobacter cloacae complex NOT DETECTED NOT DETECTED Final   Escherichia coli NOT DETECTED NOT DETECTED Final   Klebsiella oxytoca DETECTED (A) NOT DETECTED Final    Comment: CRITICAL RESULT CALLED TO, READ BACK BY AND VERIFIED WITH: Sheffield Slider PharmD 14:15 11/16/17 ([email or web address])    Klebsiella pneumoniae NOT DETECTED NOT DETECTED Final   Proteus species NOT DETECTED NOT DETECTED Final   Serratia marcescens NOT DETECTED NOT DETECTED Final   Carbapenem resistance NOT DETECTED NOT DETECTED Final   Haemophilus influenzae NOT DETECTED NOT DETECTED Final   Neisseria meningitidis NOT DETECTED NOT DETECTED Final   Pseudomonas aeruginosa NOT DETECTED NOT DETECTED Final   Candida albicans NOT DETECTED NOT DETECTED Final   Candida glabrata NOT DETECTED NOT DETECTED Final   Candida krusei  NOT DETECTED NOT DETECTED Final   Candida parapsilosis NOT DETECTED NOT DETECTED Final   Candida tropicalis NOT DETECTED NOT DETECTED Final  Culture, blood (routine x 2) Call MD if unable to obtain prior to antibiotics being given     Status: Abnormal   Collection Time: 11/15/17  9:12 PM  Result Value Ref Range Status   Specimen Description   Final    BLOOD LEFT HAND Performed at [facility], [place] [address] [place], [place] [address]    Special Requests   Final    BOTTLES DRAWN AEROBIC AND ANAEROBIC  Blood Culture adequate volume Performed at [place] [address] [place], [place] [address]    Culture  Setup Time   Final    GRAM NEGATIVE RODS IN BOTH AEROBIC AND ANAEROBIC BOTTLES CRITICAL VALUE NOTED.  VALUE IS CONSISTENT WITH PREVIOUSLY REPORTED AND CALLED VALUE.    Culture (A)  Final    KLEBSIELLA OXYTOCA SUSCEPTIBILITIES PERFORMED ON PREVIOUS CULTURE WITHIN THE LAST 5 DAYS. Performed at [facility], [place] [address], [place], [place] [address]    Report Status 11/18/2017 FINAL  Final  Culture, Urine     Status: None   Collection Time: 11/15/17  9:40 PM  Result Value Ref Range Status   Specimen Description   Final    URINE, RANDOM Performed at [place] [address], [place], [place] [address]    Special Requests   Final    NONE Performed at [facility], [place] [address], [place], [place] [address]    Culture   Final    NO GROWTH Performed at [facility], [place] [address], [place], [place] [address]    Report Status 11/17/2017 FINAL  Final  Culture, blood (routine x 2)     Status: None (Preliminary result)   Collection Time: 11/17/17 12:20 AM  Result Value Ref Range Status   Specimen Description   Final    BLOOD LEFT HAND Performed at [place] [address] [place], [place] [address]    Special Requests   Final    BOTTLES DRAWN AEROBIC AND  ANAEROBIC Blood Culture adequate volume Performed at [place] [address], [place], [place] [address]    Culture   Final    NO GROWTH 4 DAYS Performed at [facility], [place] [address] [place], [place] [address]    Report Status PENDING  Incomplete  Culture, blood (routine x 2)     Status: None (Preliminary result)   Collection Time: 11/17/17 12:20 AM  Result Value Ref Range Status   Specimen Description   Final    BLOOD LEFT ANTECUBITAL Performed at [place] [address] [place], [place] [address]    Special Requests   Final    BOTTLES DRAWN AEROBIC AND ANAEROBIC Blood Culture adequate volume Performed at [place] [address] [place], [place] [address]    Culture   Final    NO GROWTH 4 DAYS Performed at [facility], [place] [address] [place], [place] [address]    Report Status PENDING  Incomplete      Studies: No results found.  Scheduled Meds: . atenolol  25 mg Oral Daily  . Chlorhexidine Gluconate Cloth  6 each Topical Daily  . enoxaparin (LOVENOX) injection  40 mg Subcutaneous Daily  . [START ON 11/22/2017] hydrochlorothiazide  12.5 mg Oral Daily  . insulin aspart  0-20 Units Subcutaneous TID WC  . insulin aspart  0-5 Units Subcutaneous QHS  . losartan  100 mg Oral Daily  . mouth rinse  15 mL Mouth Rinse BID  . [START ON 11/22/2017] pantoprazole  80 mg Oral Daily    Continuous Infusions: . [name] KitchenTPN (CLINIMIX-E) Adult Stopped (11/21/17 1403)  . cefTRIAXone (ROCEPHIN)  IV Stopped (11/20/17 2305)     LOS: 15 days     Alma Friendly, MD [facility]   If 7PM-7AM, please contact night-coverage [email or web address] Password [email or web address] 11/21/2017, 5:15 PM

## 2017-11-22 LAB — CBC WITH DIFFERENTIAL/PLATELET
BASOS PCT: 0 %
Basophils Absolute: 0 10*3/uL (ref 0.0–0.1)
EOS ABS: 0.2 10*3/uL (ref 0.0–0.7)
Eosinophils Relative: 2 %
HCT: 28.9 % — ABNORMAL LOW (ref 39.0–52.0)
Hemoglobin: 9.5 g/dL — ABNORMAL LOW (ref 13.0–17.0)
Lymphocytes Relative: 12 %
Lymphs Abs: 1.3 10*3/uL (ref 0.7–4.0)
MCH: 32.2 pg (ref 26.0–34.0)
MCHC: 32.9 g/dL (ref 30.0–36.0)
MCV: 98 fL (ref 78.0–100.0)
Monocytes Absolute: 1.3 10*3/uL (ref 0.1–1.0)
Monocytes Relative: 11 %
NEUTROS PCT: 75 %
Neutro Abs: 8.3 10*3/uL (ref 1.7–7.7)
Platelets: 543 10*3/uL — ABNORMAL HIGH (ref 150–400)
RBC: 2.95 MIL/uL — AB (ref 4.22–5.81)
RDW: 13.7 % (ref 11.5–15.5)
WBC: 11.1 10*3/uL — AB (ref 4.0–10.5)

## 2017-11-22 LAB — GLUCOSE, CAPILLARY
GLUCOSE-CAPILLARY: 129 mg/dL — AB (ref 70–99)
GLUCOSE-CAPILLARY: 120 mg/dL — AB (ref 70–99)
Glucose-Capillary: 82 mg/dL (ref 70–99)
Glucose-Capillary: 87 mg/dL (ref 70–99)

## 2017-11-22 LAB — CULTURE, BLOOD (ROUTINE X 2)
CULTURE: NO GROWTH
CULTURE: NO GROWTH
Special Requests: ADEQUATE
Special Requests: ADEQUATE

## 2017-11-22 LAB — BASIC METABOLIC PANEL
ANION GAP: 9 (ref 5–15)
BUN: 15 mg/dL (ref 8–23)
CO2: 26 mmol/L (ref 22–32)
CREATININE: 0.88 mg/dL (ref 0.61–1.24)
Calcium: 8.2 mg/dL — ABNORMAL LOW (ref 8.9–10.3)
Chloride: 102 mmol/L (ref 98–111)
GLUCOSE: 79 mg/dL (ref 70–99)
Potassium: 3.8 mmol/L (ref 3.5–5.1)
Sodium: 137 mmol/L (ref 135–145)

## 2017-11-22 MED ORDER — FINASTERIDE 5 MG PO TABS
5.0000 mg | ORAL_TABLET | Freq: Every day | ORAL | Status: DC
  Administered 2017-11-22 – 2017-11-23 (×2): 5 mg via ORAL
  Filled 2017-11-22 (×2): qty 1

## 2017-11-22 MED ORDER — ADULT MULTIVITAMIN W/MINERALS CH
1.0000 | ORAL_TABLET | Freq: Every day | ORAL | Status: DC
  Administered 2017-11-22 – 2017-11-23 (×2): 1 via ORAL
  Filled 2017-11-22 (×2): qty 1

## 2017-11-22 MED ORDER — ENSURE ENLIVE PO LIQD
237.0000 mL | ORAL | Status: DC
  Administered 2017-11-22 – 2017-11-23 (×2): 237 mL via ORAL

## 2017-11-22 MED ORDER — CEFDINIR 300 MG PO CAPS
300.0000 mg | ORAL_CAPSULE | Freq: Two times a day (BID) | ORAL | Status: DC
  Administered 2017-11-22 – 2017-11-23 (×3): 300 mg via ORAL
  Filled 2017-11-22 (×4): qty 1

## 2017-11-22 NOTE — Plan of Care (Signed)
Plan of care discussed with patient 

## 2017-11-22 NOTE — Progress Notes (Signed)
Physical Therapy Treatment Patient Details Name: Oscar [name] MRN: [id number] DOB: [birth year] Today's Date: 11/22/2017    History of Present Illness 84 yo male admitted with SBO, abd pain. Hx of remote esophageal ca, OA, gout, Av block, DM    PT Comments    Pt very cooperative and progressing steadily with mobility.  Pt sup/min guard at this point for all tasks but requiring cueing and reinforcement for safety.  Continue to recommend 24/7 assist and HHPt follow up for pt to dc home.  Follow Up Recommendations  Home health PT;Supervision/Assistance - 24 hour     Equipment Recommendations  Rolling walker with 5" wheels    Recommendations for Other Services       Precautions / Restrictions Precautions Precautions: Fall Restrictions Weight Bearing Restrictions: No    Mobility  Bed Mobility Overal bed mobility: Modified Independent             General bed mobility comments: Pt unassisted to EOB sitting  Transfers Overall transfer level: Needs assistance Equipment used: Rolling walker (2 wheeled) Transfers: Sit to/from [facility] Sit to Stand: Supervision Stand pivot transfers: Min guard       General transfer comment: A to steady. VC safety, hand placement. Stand pivot, bed to bsc.   Ambulation/Gait Ambulation/Gait assistance: Min Gaffer (Feet): 450 Feet Assistive device: Rolling walker (2 wheeled) Gait Pattern/deviations: Step-through pattern;Decreased stride length     General Gait Details: Cues for position from RW, single standing rest break to complete task   Stairs             Wheelchair Mobility    Modified Rankin (Stroke Patients Only)       Balance Overall balance assessment: Needs assistance Sitting-balance support: No upper extremity supported;Feet supported Sitting balance-Leahy Scale: Good       Standing balance-Leahy Scale: Fair                              Cognition  Arousal/Alertness: Awake/alert Behavior During Therapy: WFL for tasks assessed/performed Overall Cognitive Status: Within Functional Limits for tasks assessed                                 General Comments: Pt appears to have memory issues with repeatedly asking same questions      Exercises      General Comments        Pertinent Vitals/Pain Pain Assessment: No/denies pain    Home Living                      Prior Function            PT Goals (current goals can now be found in the care plan section) Acute Rehab PT Goals Patient Stated Goal: home soon PT Goal Formulation: With patient Time For Goal Achievement: 11/28/17 Potential to Achieve Goals: Good Progress towards PT goals: Progressing toward goals    Frequency    Min 3X/week      PT Plan Current plan remains appropriate    Co-evaluation              AM-PAC PT "6 Clicks" Daily Activity  Outcome Measure  Difficulty turning over in bed (including adjusting bedclothes, sheets and blankets)?: A Little Difficulty moving from lying on back to sitting on the side of the bed? : A Little Difficulty  sitting down on and standing up from a chair with arms (e.g., wheelchair, bedside commode, etc,.)?: A Little Help needed moving to and from a bed to chair (including a wheelchair)?: A Little Help needed walking in hospital room?: A Little Help needed climbing 3-5 steps with a railing? : A Little 6 Click Score: 18    End of Session Equipment Utilized During Treatment: Gait belt Activity Tolerance: Patient tolerated treatment well Patient left: in chair;with chair alarm set;with call bell/phone within reach Nurse Communication: Mobility status PT Visit Diagnosis: Muscle weakness (generalized) (M62.81);Unsteadiness on feet (R26.81)     Time: 2585-2778 PT Time Calculation (min) (ACUTE ONLY): 25 min  Charges:  $Gait Training: 23-37 mins                    G Codes:       Pg [phone]  [phone]    Marget Outten 11/22/2017, 12:59 PM

## 2017-11-22 NOTE — Progress Notes (Signed)
Patient declines the use of nocturnal CPAP for tonight and the remaining portion of his stay. He states that he has had a machine at home in the past but wasn't compliant with it and does not wish to try hospital supplied equipment. Order changed to prn per RT protocol.

## 2017-11-22 NOTE — Progress Notes (Signed)
Subjective/Chief Complaint: Continues to have flatus and BMs. Denies n/v. Denies any abdominal pain. Tolerating regular diet now since yesterday without issue. Has had issues with urinary retention.  Objective: Vital signs in last 24 hours: Temp:  [97.9 F (36.6 C)-98.6 F (37 C)] 98.3 F (36.8 C) (07/17 0800) Pulse Rate:  [74-97] 74 (07/17 0800) Resp:  [13-28] 25 (07/17 0800) BP: (131-200)/(57-106) 166/85 (07/17 0800) SpO2:  [90 %-100 %] 98 % (07/17 0800) Weight:  [90 kg (198 lb 6.6 oz)-90.9 kg (200 lb 6.4 oz)] 90.9 kg (200 lb 6.4 oz) (07/17 0500) Last BM Date: 11/21/17  Intake/Output from previous day: 07/16 0701 - 07/17 0700 In: 1136.7 [P.O.:360; I.V.:656.7; IV Piggyback:120] Out: 2650 [Urine:2650] Intake/Output this shift: No intake/output data recorded.  Gen: NAD, comfortable, sitting in chair CV: RRR Pulm: Normal work of breathing GI: abdomen is soft nontender nondistended   Lab Results:  Recent Labs    11/21/17 0527 11/22/17 0521  WBC 10.4 11.1*  HGB 8.9* 9.5*  HCT 26.3* 28.9*  PLT 412* 543*   BMET Recent Labs    11/21/17 0527 11/22/17 0521  NA 134* 137  K 3.8 3.8  CL 100 102  CO2 25 26  GLUCOSE 224* 79  BUN 16 15  CREATININE 0.88 0.88  CALCIUM 8.1* 8.2*   PT/INR No results for input(s): LABPROT, INR in the last 72 hours. ABG No results for input(s): PHART, HCO3 in the last 72 hours.  Invalid input(s): PCO2, PO2  Studies/Results: Dg Chest Port 1 View  Result Date: 11/20/2017 CLINICAL DATA:  84 year old male with history of congestion. EXAM: PORTABLE CHEST 1 VIEW COMPARISON:  Chest x-ray 11/17/2017. FINDINGS: There is a right upper extremity PICC with tip terminating in the mid superior vena cava. Patchy multifocal interstitial and airspace disease asymmetrically distributed throughout the lungs bilaterally, most confluent throughout the right mid to upper lung. Improving aeration in the left base. No pleural effusions. No evidence of pulmonary  edema. Heart size is mildly enlarged. Upper mediastinal contours are within normal limits. Aortic atherosclerosis. IMPRESSION: 1. Support apparatus, as above. 2. Patchy multifocal asymmetrically distributed interstitial and airspace disease in the lungs bilaterally, again compatible with multilobar bronchopneumonia. Slight improved aeration suggest positive response to therapy. 3. Aortic atherosclerosis. Electronically Signed   By: Vinnie Langton M.D.   On: 11/20/2017 10:29    Anti-infectives: Anti-infectives (From admission, onward)   Start     Dose/Rate Route Frequency Ordered Stop   11/18/17 0800  vancomycin (VANCOCIN) IVPB 750 mg/150 ml premix  Status:  Discontinued     750 mg 150 mL/hr over 60 Minutes Intravenous Every 12 hours 11/17/17 1642 11/17/17 1805   11/17/17 1830  cefTRIAXone (ROCEPHIN) 2 g in sodium chloride 0.9 % 100 mL IVPB     2 g 200 mL/hr over 30 Minutes Intravenous Every 24 hours 11/17/17 1805     11/17/17 1800  vancomycin (VANCOCIN) 1,500 mg in sodium chloride 0.9 % 500 mL IVPB  Status:  Discontinued     1,500 mg 250 mL/hr over 120 Minutes Intravenous  Once 11/17/17 1642 11/17/17 1805   11/17/17 1800  meropenem (MERREM) 1 g in sodium chloride 0.9 % 100 mL IVPB  Status:  Discontinued     1 g 200 mL/hr over 30 Minutes Intravenous Every 8 hours 11/17/17 1647 11/17/17 1805   11/16/17 2200  cefTRIAXone (ROCEPHIN) 2 g in sodium chloride 0.9 % 100 mL IVPB  Status:  Discontinued     2 g 200 mL/hr over  30 Minutes Intravenous Every 24 hours 11/16/17 1447 11/17/17 1609   11/16/17 2100  vancomycin (VANCOCIN) 1,500 mg in sodium chloride 0.9 % 500 mL IVPB  Status:  Discontinued     1,500 mg 250 mL/hr over 120 Minutes Intravenous Every 24 hours 11/15/17 1951 11/16/17 1131   11/15/17 2200  piperacillin-tazobactam (ZOSYN) IVPB 3.375 g  Status:  Discontinued     3.375 g 12.5 mL/hr over 240 Minutes Intravenous Every 8 hours 11/15/17 2137 11/16/17 1447   11/15/17 2030  vancomycin  (VANCOCIN) 1,750 mg in sodium chloride 0.9 % 500 mL IVPB     1,750 mg 250 mL/hr over 120 Minutes Intravenous  Once 11/15/17 1948 11/15/17 2330   11/15/17 2000  ceFEPIme (MAXIPIME) 1 g in sodium chloride 0.9 % 100 mL IVPB  Status:  Discontinued     1 g 200 mL/hr over 30 Minutes Intravenous Every 8 hours 11/15/17 1932 11/15/17 2132   11/08/17 1200  fluconazole (DIFLUCAN) IVPB 100 mg  Status:  Discontinued     100 mg 50 mL/hr over 60 Minutes Intravenous Every 24 hours 11/08/17 1032 11/10/17 0934      Assessment/Plan: SBO- admitted 11/06/17 - resolved  -this has resolved clinically and radiologically at this point -diet as tolerated AspirationPNA- per medicine and id Malnutritionrelated to chronic illness Please let us know if additional questions or concerns arise - we will sign off for now  Ileana Roup 11/22/2017

## 2017-11-22 NOTE — Progress Notes (Signed)
Nutrition Follow-up  DOCUMENTATION CODES:   Non-severe (moderate) malnutrition in context of chronic illness  INTERVENTION:  - Will order Ensure Enlive once/day, this supplement provides 350 kcal and 20 grams of protein. - Will order Magic Cup BID with lunch and dinner meals, each supplement provides 290 kcal and 9 grams of protein. - Will order daily multivitamin with minerals.  - Continue to encourage PO intakes.   NUTRITION DIAGNOSIS:   Moderate Malnutrition related to chronic illness(recurrent SBO) as evidenced by energy intake < 75% for > or equal to 1 month, moderate muscle depletion, moderate fat depletion, severe muscle depletion. -ongoing  GOAL:   Patient will meet greater than or equal to 90% of their needs -unmet at this time.   MONITOR:   PO intake, Supplement acceptance, Weight trends, Labs  ASSESSMENT:   Patient with PMH significant for HTN, remote esophogeal cancer s/p esophagectomy 2001, DM, and HLD. Presents this admission with complaints of nausea/vomiting x 24 hours. Found to have a small bowel obstruction. NGT unable to be placed in radiology.   Weight had mainly been stable since admission. Today's weight is slightly higher/is the highest since admission which is likely fluid-related. Diet advanced from CLD to [place] on 7/15 at 12:20 PM and from [place] to Soft yesterday at 12:20 PM. Patient consumed 25% of breakfast yesterday (110 kcal, 2.5 grams of protein). He consumed 100% of lunch and dinner for which he ordered the same items: cream of potato soup, a bottle of water, and vanilla ice cream (each meal provided 280 kcal, 5 grams of protein).   Patient received breakfast tray a short time ago and ordered some soft foods to try this AM as he is not having any abdominal pain or nausea and feels up to more solid items. He ordered coffee, a bottle of water, grits, scrambled eggs, and a blueberry muffin.   TPN was discontinued yesterday and PICC was removed. Per Dr.  Serena Colonel note yesterday evening, plan to transfer to med-surg today. Reviewed Dr. Orest Dikes note from this AM.    Medications reviewed; sliding scale Novolog, 80 mg oral Protonix/day starting today. Labs reviewed; CBGs: 79 and 82 mg/dL today, Ca: 8.2 mg/dL.       Diet Order:   Diet Order           DIET SOFT Room service appropriate? Yes; Fluid consistency: Thin  Diet effective now          EDUCATION NEEDS:   Education needs have been addressed  Skin:  Skin Assessment: Reviewed RN Assessment  Last BM:  7/16  Height:   Ht Readings from Last 1 Encounters:  11/06/17 6\' 2"  (1.88 m)    Weight:   Wt Readings from Last 1 Encounters:  11/22/17 200 lb 6.4 oz (90.9 kg)    Ideal Body Weight:  86.4 kg  BMI:  Body mass index is 25.73 kg/m.  Estimated Nutritional Needs:   Kcal:  2400-2600 kcal  Protein:  120-130 g  Fluid:  >2.4 L/day     Jarome Matin, MS, RD, LDN, [facility] Inpatient Clinical Dietitian Pager # [phone] After hours/weekend pager # [phone]

## 2017-11-22 NOTE — Progress Notes (Addendum)
PROGRESS NOTE  Oscar [name] MPN:[id number] DOB: 03-04-1933 DOA: 11/06/2017 PCP: Deland Pretty, MD  HPI/Recap of past 43 hours: 84 year old male hypertension, esophageal cancer status post esophagectomy 2001 OSA Repeat SBO [admit 02/2017 for same]  presents to the ER 7/1 complaining of abdominal pain, nausea, vomiting.  Patient was noted to have SBO.  Patient noted to have refused surgical intervention earlier during the admission, but now is agreeable.  Surgery is on board and currently managing conservatively.  SUBJECTIVE\ Awake alert oriented just finished walking around the unit feels a little winded Nurse reports periodic urinary incontinence-notes some allergy to Flomax-patient states this happens periodically only and especially when he is immobile No fever no chills  Assessment/Plan: Principal Problem:   SBO (small bowel obstruction) (HCC) Active Problems:   HTN (hypertension)   OSA (obstructive sleep apnea)   Acute on chronic respiratory failure with hypoxia (HCC)  Sepsis with Klebsiella bacteremia Likely source due to aspiration pna due to SBO Temp spike on 11/18/17, with resolved leukocytosis BC growing klebsiella oxytoca sensitive to ceftriaxone, repeat BC X 2 NGTD UA positive for RBC, UC with no growth Procalcitonin 52.49-->43.56-->18.21 CXR 7/15 showed improvement Initially placed on multiple therapies then ceftriaxone and I did discuss 7/17 with Dr. Johnnye Sima and transitioning to [facility] 300 twice daily for the rest of his course which will end on 11/30/2017 SLP on board Advance to soft diet [place] to MedSurg  Acute hypoxic respiratory distress Likely 2/2 aspiration pna due to SBO Currently on RA, saturating well, no increased WOB Supplemental O2, incentive spirometry, duonebs  AKI Resolved Likely due to sepsis/bacteremia  Small bowel obstruction Resolved as per surgery Serial abdominal x-ray revealed SBO has resolved General surgery on board, and have  signed off as of 717 D/C TPN/PICC line on 11/21/17 Advanced diet  Electrolyte abnormalities Hypokalemia, hypomagnesemia Aortic labs needed  Hypertension Continue IV hydralazine prn, losartan/HCT, atenolol  OSA Continue CPAP  Hyperglycemia Likely due to TPN, which has been discontinued 7/16   Code Status: Full  Family Communication: None at bedside  Disposition Plan: Once more stable   Consultants:  General surgery  ID  Procedures:  None  Antimicrobials:  IV Ceftriaxone  DVT prophylaxis: Enoxaparin   Objective: Vitals:   11/22/17 0500 11/22/17 0600 11/22/17 0700 11/22/17 0800  BP:  (!) 165/74  (!) 166/85  Pulse:    74  Resp: 20 20 (!) 25 (!) 25  Temp:    98.3 F (36.8 C)  TempSrc:    Oral  SpO2: 98% 100% 95% 98%  Weight: 90.9 kg (200 lb 6.4 oz)     Height:        Intake/Output Summary (Last 24 hours) at 11/22/2017 0925 Last data filed at 11/22/2017 0600 Gross per 24 hour  Intake 684.7 ml  Output 2650 ml  Net -1965.3 ml   Filed Weights   11/21/17 0500 11/21/17 0935 11/22/17 0500  Weight: 90 kg (198 lb 6.6 oz) 90 kg (198 lb 6.6 oz) 90.9 kg (200 lb 6.4 oz)    Exam:  Awake alert pleasant no distress slightly frail EOMI NCAT by temporalis wasting S1-S2 no murmur Chest is clear Abdomen soft nontender nondistended No lower extremity edema Range of motion intact Oriented to place can tell me this is July-can tell me the year-states that sometimes when he does not get enough sleep he gets a little confused  Data Reviewed: CBC: Recent Labs  Lab 11/18/17 0544 11/19/17 0442 11/20/17 0509 11/21/17 0527 11/22/17 0521  WBC  26.6* 23.2* 17.5* 10.4 11.1*  NEUTROABS 23.7* 19.7* 14.5* 7.8* 8.3  HGB 9.4* 9.3* 8.9* 8.9* 9.5*  HCT 27.7* 27.3* 26.5* 26.3* 28.9*  MCV 96.5 95.8 96.4 96.3 98.0  PLT 265 302 353 412* 161*   Basic Metabolic Panel: Recent Labs  Lab 11/16/17 0516 11/17/17 0519 11/18/17 0544 11/19/17 0442 11/20/17 0509 11/21/17 0527  11/22/17 0521  NA 140 141 139 136 131* 134* 137  K 4.0 3.2* 3.6 3.3* 3.5 3.8 3.8  CL 108 106 103 101 99 100 102  CO2 23 29 28 27 25 25 26   GLUCOSE 189* 248* 297* 241* 226* 224* 79  BUN 22 19 16 16 17 16 15   CREATININE 1.31* 1.00 0.98 0.88 0.83 0.88 0.88  CALCIUM 7.9* 7.9* 8.0* 7.9* 7.7* 8.1* 8.2*  MG 1.9 2.0 1.8 2.0 1.9  --   --   PHOS 3.5 2.4* 2.3* 3.0 3.4  --   --    GFR: Estimated Creatinine Clearance: 75.2 mL/min (by C-G formula based on SCr of 0.88 mg/dL). Liver Function Tests: Recent Labs  Lab 11/15/17 2121 11/16/17 0516 11/19/17 0442 11/20/17 0509  AST 44* 36 43* 28  ALT 29 27 33 30  ALKPHOS 70 57 76 67  BILITOT 2.8* 2.9* 1.1 0.7  PROT 5.3* 5.5* 5.4* 5.7*  ALBUMIN 2.5* 2.6* 2.2* 2.2*   No results for input(s): LIPASE, AMYLASE in the last 168 hours. No results for input(s): AMMONIA in the last 168 hours. Coagulation Profile: No results for input(s): INR, PROTIME in the last 168 hours. Cardiac Enzymes: No results for input(s): CKTOTAL, CKMB, CKMBINDEX, TROPONINI in the last 168 hours. BNP (last 3 results) No results for input(s): PROBNP in the last 8760 hours. HbA1C: No results for input(s): HGBA1C in the last 72 hours. CBG: Recent Labs  Lab 11/21/17 1625 11/21/17 2243 11/21/17 2338 11/21/17 2357 11/22/17 0743  GLUCAP 80 69* 68* 79 82   Lipid Profile: Recent Labs    11/20/17 0509  TRIG 124   Thyroid Function Tests: No results for input(s): TSH, T4TOTAL, FREET4, T3FREE, THYROIDAB in the last 72 hours. Anemia Panel: No results for input(s): VITAMINB12, FOLATE, FERRITIN, TIBC, IRON, RETICCTPCT in the last 72 hours. Urine analysis:    Component Value Date/Time   COLORURINE AMBER (A) 11/15/2017 2140   APPEARANCEUR HAZY (A) 11/15/2017 2140   LABSPEC 1.011 11/15/2017 2140   PHURINE 5.0 11/15/2017 2140   GLUCOSEU NEGATIVE 11/15/2017 2140   HGBUR LARGE (A) 11/15/2017 2140   BILIRUBINUR NEGATIVE 11/15/2017 2140   KETONESUR NEGATIVE 11/15/2017 2140    PROTEINUR NEGATIVE 11/15/2017 2140   UROBILINOGEN 0.2 09/12/2014 1816   NITRITE NEGATIVE 11/15/2017 2140   LEUKOCYTESUR NEGATIVE 11/15/2017 2140   Sepsis Labs: @LABRCNTIP (procalcitonin:4,lacticidven:4)    Studies: No results found.  Scheduled Meds: . atenolol  25 mg Oral Daily  . Chlorhexidine Gluconate Cloth  6 each Topical Daily  . enoxaparin (LOVENOX) injection  40 mg Subcutaneous Daily  . feeding supplement (ENSURE ENLIVE)  237 mL Oral Q24H  . hydrochlorothiazide  12.5 mg Oral Daily  . insulin aspart  0-20 Units Subcutaneous TID WC  . insulin aspart  0-5 Units Subcutaneous QHS  . losartan  100 mg Oral Daily  . mouth rinse  15 mL Mouth Rinse BID  . multivitamin with minerals  1 tablet Oral Daily  . pantoprazole  80 mg Oral Daily    Continuous Infusions: . cefTRIAXone (ROCEPHIN)  IV Stopped (11/22/17 0030)     LOS: 16 days  Nita Sells, MD [facility]   If 7PM-7AM, please contact night-coverage [email or web address] Password [facility] 11/22/2017, 9:25 AM

## 2017-11-23 LAB — CBC WITH DIFFERENTIAL/PLATELET
BASOS PCT: 1 %
Basophils Absolute: 0.1 10*3/uL (ref 0.0–0.1)
EOS ABS: 0.2 10*3/uL (ref 0.0–0.7)
Eosinophils Relative: 1 %
HCT: 27.8 % — ABNORMAL LOW (ref 39.0–52.0)
Hemoglobin: 9.1 g/dL — ABNORMAL LOW (ref 13.0–17.0)
Lymphocytes Relative: 12 %
Lymphs Abs: 1.5 10*3/uL (ref 0.7–4.0)
MCH: 32.4 pg (ref 26.0–34.0)
MCHC: 32.7 g/dL (ref 30.0–36.0)
MCV: 98.9 fL (ref 78.0–100.0)
MONOS PCT: 8 %
Monocytes Absolute: 1 10*3/uL (ref 0.1–1.0)
Neutro Abs: 9.8 10*3/uL (ref 1.7–7.7)
Neutrophils Relative %: 78 %
PLATELETS: 573 10*3/uL — AB (ref 150–400)
RBC: 2.81 MIL/uL — ABNORMAL LOW (ref 4.22–5.81)
RDW: 13.1 % (ref 11.5–15.5)
WBC: 12.6 10*3/uL — ABNORMAL HIGH (ref 4.0–10.5)

## 2017-11-23 LAB — GLUCOSE, CAPILLARY: GLUCOSE-CAPILLARY: 109 mg/dL — AB (ref 70–99)

## 2017-11-23 MED ORDER — FINASTERIDE 5 MG PO TABS: 5.0000 mg | ORAL_TABLET | Freq: Every day | ORAL | 1 refills | Status: DC

## 2017-11-23 MED ORDER — CEFDINIR 300 MG PO CAPS: 300.0000 mg | ORAL_CAPSULE | Freq: Two times a day (BID) | ORAL | 0 refills | Status: DC

## 2017-11-23 NOTE — Care Management Important Message (Signed)
Important Message  Patient Details  Name: NADIR VASQUES MRN: [id number] Date of Birth: 1932-10-14   Medicare Important Message Given:  Yes    Kerin Salen 11/23/2017, 10:29 AMImportant Message  Patient Details  Name: SOMTOCHUKWU WOOLLARD MRN: [id number] Date of Birth: 03-17-1933   Medicare Important Message Given:  Yes    Kerin Salen 11/23/2017, 10:29 AM

## 2017-11-23 NOTE — Discharge Summary (Signed)
Physician Discharge Summary  Oscar [name] QPR:[id number] DOB: [birth year] DOA: 11/06/2017  PCP: Deland Pretty, MD  Admit date: 11/06/2017 Discharge date: 11/23/2017  Time spent: 30 minutes  Recommendations for Outpatient Follow-up:  1. Recommend outpatient follow-up with GI for routine screening of his esophageal cancer and also his small bowel obstructions 2. He will need to complete full course of Omnicef for aspiration pneumonia 3. He had some urinary retention and will need to continue finasteride  Discharge Diagnoses:  Principal Problem:   SBO (small bowel obstruction) (HCC) Active Problems:   HTN (hypertension)   OSA (obstructive sleep apnea)   Acute on chronic respiratory failure with hypoxia [facility])   Discharge Condition: improved  Diet recommendation: soft  Filed Weights   11/21/17 0935 11/22/17 0500 11/23/17 0409  Weight: 90 kg (198 lb 6.6 oz) 90.9 kg (200 lb 6.4 oz) 84.9 kg (187 lb 2.7 oz)    History of present illness:  84 year old male hypertension, esophageal cancer status post esophagectomy 2001 OSA Repeat SBO [admit 02/2017 for same]  presents to the ER 7/1 complaining of abdominal pain, nausea, vomiting.  Patient was noted to have SBO. .  Surgery is on board and currently managing conservatively--this resolved during hospital stay    Hospital Course:  Small bowel obstruction-resolved with conservative measures--was on TPN and had a PICC line which was discontinued on 7/16  Klebsiella bacteremia likely secondary to aspiration pneumonia-procalcitonin was very high-was placed on multiple antibiotics and then transition from ceftriaxone to [place] ending on 11/30/2017-prescription given for the same  aCute hypoxic respiratory failure secondary to pneumonia-resolved did not need any further oxygen  Hypertension resume home meds  OSA-CPAP at home  Esophagectomy in the past-needs follow-up in the outpatient setting-might need scope and or further evaluation of  GI tract by his primary gastroenterologist  Urinary incontinence was intermittent and he is allergic to Flomax or finasteride was started on the patient and this helped   Procedures:  None   Consultations:  General surgery  Discharge Exam: Vitals:   11/22/17 2123 11/23/17 0409  BP: (!) 119/46 135/76  Pulse: 77 82  Resp: 18 16  Temp: 98 F (36.7 C) 98 F (36.7 C)  SpO2: 98% 98%    General: Awake alert pleasant no distress Cardiovascular: S1-S2 no murmur rub or gallop Respiratory: Clinically clear no added sound Abdomen soft nontender passing multiple stools no distress no vomiting  Discharge Instructions   Discharge Instructions    Diet - low sodium heart healthy   Complete by:  As directed    Discharge instructions   Complete by:  As directed    We have started you on a medication to help you pass urine call finasteride I would continue the same For your pneumonia I recommend completing the course of Omnicef using all of the antibiotics completely Please follow-up with your primary physician for further management of your general medical issues   Increase activity slowly   Complete by:  As directed      Allergies as of 11/23/2017      Reactions   Bee Venom Anaphylaxis   Tamsulosin Other (See Comments)   "Makes me feel weird"      Medication List    TAKE these medications   alprostadil 1000 MCG pellet Commonly known as:  MUSE 1,000 mcg by Transurethral route daily as needed for erectile dysfunction. use no more than 3 times per week   atenolol 25 MG tablet Commonly known as:  TENORMIN Take 1 tablet (  25 mg total) by mouth daily.   cefdinir 300 MG capsule Commonly known as:  OMNICEF Take 1 capsule (300 mg total) by mouth every 12 (twelve) hours.   finasteride 5 MG tablet Commonly known as:  PROSCAR Take 1 tablet (5 mg total) by mouth daily. Start taking on:  11/24/2017   HYDROcodone-acetaminophen 5-325 MG tablet Commonly known as:   NORCO/VICODIN Take 1 tablet by mouth every 6 (six) hours as needed for moderate pain.   losartan-hydrochlorothiazide 100-12.5 MG tablet Commonly known as:  HYZAAR Take 0.5 tablets by mouth daily.   omeprazole 20 MG capsule Commonly known as:  PRILOSEC Take 40 mg by mouth 2 (two) times daily before a meal.   polyethylene glycol packet Commonly known as:  MIRALAX / GLYCOLAX Take 17 g by mouth at bedtime as needed for mild constipation.            Durable Medical Equipment  (From admission, onward)        Start     Ordered   11/23/17 1112  For home use only DME Walker rolling  [facility])  Once    Question:  Patient needs a walker to treat with the following condition  Answer:  Debility   11/23/17 1132     Allergies  Allergen Reactions  . Bee Venom Anaphylaxis  . Tamsulosin Other (See Comments)    "Makes me feel weird"      The results of significant diagnostics from this hospitalization (including imaging, microbiology, ancillary and laboratory) are listed below for reference.    Significant Diagnostic Studies: Dg Chest 2 View  Result Date: 11/12/2017 CLINICAL DATA:  Abnormal chest radiograph. EXAM: CHEST - 2 VIEW COMPARISON:  11/11/2017; 02/12/2017; 07/30/2010 FINDINGS: Grossly unchanged cardiac silhouette and mediastinal contours with atherosclerotic plaque when the thoracic aorta. Retrocardiac opacities compatible with provided history of gastric pull-through. Postsurgical change of the left hilum. Ill-defined heterogeneous opacities within the right mid and left lower lung are unchanged given differences in imaging technique. No new focal airspace opacities. Trace pleural effusions are not excluded. No pneumothorax. No acute osseus abnormalities. Mild scoliotic curvature of the thoracolumbar spine. IMPRESSION: Grossly unchanged right mid and left lower lung heterogeneous opacities with differential considerations including asymmetric pulmonary edema versus aspiration /  multifocal infection. Clinical correlation is advised. A follow-up chest radiograph in 3 to 4 weeks after treatment is recommended to ensure resolution. Electronically Signed   By: Sandi Mariscal M.D.   On: 11/12/2017 12:21   Ct Abdomen Pelvis W Contrast  Result Date: 11/06/2017 CLINICAL DATA:  Abdominal pain, constipation, distention history of esophageal cancer EXAM: CT ABDOMEN AND PELVIS WITH CONTRAST TECHNIQUE: Multidetector CT imaging of the abdomen and pelvis was performed using the standard protocol following bolus administration of intravenous contrast. CONTRAST:  168mL ISOVUE-300 IOPAMIDOL (ISOVUE-300) INJECTION 61% COMPARISON:  02/24/2017 FINDINGS: Lower chest: Changes of prior soft ectomy and gastric pull-through again noted, stable. Scarring in the lung bases. No effusions. Mild cardiomegaly. Hepatobiliary: Layering stones within the gallbladder. No focal hepatic abnormality. Pancreas: No focal abnormality or ductal dilatation. Spleen: No focal abnormality.  Normal size. Adrenals/Urinary Tract: No adrenal abnormality. No focal renal abnormality. No stones or hydronephrosis. Urinary bladder is unremarkable. Stomach/Bowel: Dilated fluid-filled small bowel loops are noted in the abdomen and pelvis. Distal small bowel loops are decompressed. Findings compatible with small-bowel obstruction. Transition point appears to be in the midline of the pelvis on image 44. Normal appendix. Descending colonic and sigmoid diverticulosis. Vascular/Lymphatic: Diffuse aortic calcifications. No evidence  of aneurysm or adenopathy. Reproductive: Mild prostate enlargement. Other: Small amount of free fluid in the pelvis.  No free air. Musculoskeletal: No acute bony abnormality. IMPRESSION: Small bowel obstruction with transition noted in the mid to distal small bowel in the midline of the pelvis. Cholelithiasis. Changes of esophagectomy and gastric pull-through. Cardiomegaly.  Scarring in the lung bases. Left colonic  diverticulosis. Small amount of free fluid in the pelvis. Prostate enlargement. Electronically Signed   By: Rolm Baptise M.D.   On: 11/06/2017 08:45   Dg Esophagus  Result Date: 11/13/2017 CLINICAL DATA:  84 year old male with esophageal cancer post gastric pull-up. Small bowel obstruction. Initial encounter. EXAM: ESOPHOGRAM/BARIUM SWALLOW TECHNIQUE: Single contrast examination was performed using thin barium per request of Dr. Hassell Done. : Fluoroscopy Time:  2 minutes and 6 seconds. Radiation Exposure Index: 20 mGy. COMPARISON:  11/06/2017 CT abdomen and pelvis. FINDINGS: Transient laryngeal penetration without aspiration. Post esophagectomy with gastric pull up. Smooth narrowing at the anastomotic site (proximal 1/3 of the native esophagus). No evidence of irregularity or leak at the anastomotic site. Slight delayed emptying from the stomach into dilated small bowel loops consistent with patient's small-bowel obstruction. IMPRESSION: Post partial esophagectomy with gastric pull up. Smooth appearance of the anastomotic site without irregularity, stricture or leak identified. Delayed emptying from the gastric pull up into dilated small bowel loops consistent with patient's small-bowel obstruction. Transient laryngeal penetration. Electronically Signed   By: Genia Del M.D.   On: 11/13/2017 14:04   Dg Chest Port 1 View  Result Date: 11/20/2017 CLINICAL DATA:  84 year old male with history of congestion. EXAM: PORTABLE CHEST 1 VIEW COMPARISON:  Chest x-ray 11/17/2017. FINDINGS: There is a right upper extremity PICC with tip terminating in the mid superior vena cava. Patchy multifocal interstitial and airspace disease asymmetrically distributed throughout the lungs bilaterally, most confluent throughout the right mid to upper lung. Improving aeration in the left base. No pleural effusions. No evidence of pulmonary edema. Heart size is mildly enlarged. Upper mediastinal contours are within normal limits. Aortic  atherosclerosis. IMPRESSION: 1. Support apparatus, as above. 2. Patchy multifocal asymmetrically distributed interstitial and airspace disease in the lungs bilaterally, again compatible with multilobar bronchopneumonia. Slight improved aeration suggest positive response to therapy. 3. Aortic atherosclerosis. Electronically Signed   By: Vinnie Langton M.D.   On: 11/20/2017 10:29   Dg Chest Port 1 View  Result Date: 11/17/2017 CLINICAL DATA:  Pneumonia EXAM: PORTABLE CHEST 1 VIEW COMPARISON:  Portable exam 0418 hours compared to 11/15/2017 FINDINGS: Rotated to the LEFT. RIGHT arm PICC line tip projects over SVC. Enlargement of cardiac silhouette. Atherosclerotic calcification aorta. Patchy BILATERAL pulmonary infiltrates consistent with pneumonia, greatest in RIGHT upper lobe. RIGHT lung infiltrates have increased since previous exam particularly in the lower RIGHT lung. No pleural effusion or pneumothorax. Hiatal hernia. Osseous demineralization. IMPRESSION: Increased pulmonary infiltrates since 11/15/2017 consistent with multifocal pneumonia. Enlargement of cardiac silhouette. Electronically Signed   By: Lavonia Dana M.D.   On: 11/17/2017 08:33   Dg Chest Port 1 View  Result Date: 11/16/2017 CLINICAL DATA:  Cough. EXAM: PORTABLE CHEST 1 VIEW COMPARISON:  Radiograph of November 15, 2017. FINDINGS: Stable cardiomegaly. Atherosclerosis of thoracic aorta is noted. Right-sided PICC line is unchanged in position. No pneumothorax is noted. No significant pleural effusion is noted. Increased right upper lobe airspace opacity is noted concerning for pneumonia. Stable bibasilar subsegmental atelectasis. IMPRESSION: Increased right upper lobe airspace opacity is noted concerning for pneumonia. Aortic Atherosclerosis (ICD10-I70.0). Electronically Signed   By:  Marijo Conception, M.D.   On: 11/16/2017 13:30   Dg Chest Port 1 View  Result Date: 11/15/2017 CLINICAL DATA:  Acute respiratory distress. Fever. Sudden episode of  vomiting EXAM: PORTABLE CHEST 1 VIEW COMPARISON:  Three days ago FINDINGS: Airspace opacity in the right upper and left lower lobes. Cardiomegaly. Status post esophagectomy. High-density material still seen within the pull-through, with barium study 2 days ago. PICC on the right with tip at the SVC. IMPRESSION: 1. Bilateral airspace disease primarily concerning for aspiration in this clinical setting. 2. Esophagram 2 days ago with small volume high-density material still seen within the gastric pull-through. Electronically Signed   By: Monte Fantasia M.D.   On: 11/15/2017 20:45   Dg Abd 2 Views  Result Date: 11/14/2017 CLINICAL DATA:  Follow-up small bowel obstruction. Abdominal distension. EXAM: ABDOMEN - 2 VIEW COMPARISON:  11/11/2017 and multiple previous FINDINGS: Gastrointestinal contrast is present. There is a hiatal hernia. Continued partial small bowel obstruction pattern with dilated fluid filled small intestine. Contrast does reach the nondistended colon. Diverticula are noted in the left colon. No free air. IMPRESSION: Persistent dilated fluid-filled small intestine consistent with ongoing partial small bowel obstruction. Hiatal hernia also noted. Contrast does reach the nondilated colon. Electronically Signed   By: Nelson Chimes M.D.   On: 11/14/2017 10:23   Dg Abd 2 Views  Result Date: 11/10/2017 CLINICAL DATA:  Small bowel obstruction. EXAM: ABDOMEN - 2 VIEW COMPARISON:  CT from 4 days ago. Abdominal CT from 4 days ago. Radiograph from yesterday. FINDINGS: Continued small bowel obstruction with dilated and gas/fluid-filled loops. Fluid-filled loop in the right abdomen measures up to 5.6 cm diameter. No evidence of pneumoperitoneum or pneumatosis. Left upper quadrant clips.  Lung bases are clear.  Cardiomegaly. IMPRESSION: Continued small bowel obstruction.  Negative for pneumoperitoneum. Electronically Signed   By: Monte Fantasia M.D.   On: 11/10/2017 11:40   Dg Abd 2 Views  Result Date:  11/09/2017 CLINICAL DATA:  Abdominal distension and nausea EXAM: ABDOMEN - 2 VIEW COMPARISON:  11/08/2017 FINDINGS: Persistent small bowel dilatation is noted with contrast enhanced fluid throughout the small bowel structures. No perforation is noted. No free air is seen. Minimal contrast has reached the colon when compare with the prior exam. Degenerative changes of lumbar spine are noted. IMPRESSION: Persistent small bowel dilatation although some contrast has reached the proximal colon. This is consistent with a partial small bowel obstruction. Electronically Signed   By: Inez Catalina M.D.   On: 11/09/2017 07:55   Dg Abd Acute W/chest  Result Date: 11/11/2017 CLINICAL DATA:  Bowel obstruction history of esophageal cancer EXAM: DG ABDOMEN ACUTE W/ 1V CHEST COMPARISON:  11/10/2017, 11/09/2017, 11/08/2017, 11/07/2017, 02/12/2017 FINDINGS: Single-view chest demonstrates airspace disease at the left base. Patchy airspace disease in the right upper lobe. Cardiomegaly with aortic atherosclerosis. No pneumothorax. Supine and upright views of the abdomen demonstrate surgical changes in the upper abdomen. No free air beneath the diaphragm. Small scattered fluid levels. Residual contrast within the right colon. Overall decreased central small bowel gas. Dilute contrast within dilated small bowel in the abdomen measuring up to 5.7 cm. IMPRESSION: 1. Cardiomegaly. Patchy infiltrate at the left base and right upper lobe. 2. Dilute contrast filled dilated small bowel with decreased central gas consistent with ongoing small bowel obstruction. No interval free air. Electronically Signed   By: Donavan Foil M.D.   On: 11/11/2017 19:32   Dg Abd Portable 1v  Result Date: 11/17/2017 CLINICAL DATA:  Abdominal pain and distention. EXAM: PORTABLE ABDOMEN - 1 VIEW COMPARISON:  Radiographs of November 16, 2017. FINDINGS: No small bowel dilatation is noted currently, which is significantly improved compared to prior exam. Residual  contrast is noted within small bowel and colon. Phleboliths are noted in the pelvis. IMPRESSION: No bowel dilatation is noted currently. Electronically Signed   By: Marijo Conception, M.D.   On: 11/17/2017 08:57   Dg Abd Portable 1v  Result Date: 11/16/2017 CLINICAL DATA:  Small bowel obstruction. EXAM: PORTABLE ABDOMEN - 1 VIEW COMPARISON:  Radiographs of November 15, 2017. FINDINGS: Continued small bowel dilatation is noted. Residual contrast remains within small bowel loops as well as: Consistent with partial small bowel obstruction. Surgical clips are noted in the left upper quadrant. IMPRESSION: Findings consistent with partial small bowel obstruction. Electronically Signed   By: Marijo Conception, M.D.   On: 11/16/2017 13:29   Dg Abd Portable 1v  Result Date: 11/15/2017 CLINICAL DATA:  Acute respiratory distress EXAM: PORTABLE ABDOMEN - 1 VIEW COMPARISON:  Yesterday FINDINGS: Dilated small bowel with oral contrast seen diffusely. Under distended colon also has contrast. No concerning mass effect. IMPRESSION: Ongoing small bowel obstruction, partial based on colonic opacification. Electronically Signed   By: Monte Fantasia M.D.   On: 11/15/2017 22:13   Dg Abd Portable 1v  Result Date: 11/08/2017 CLINICAL DATA:  Small bowel obstruction EXAM: PORTABLE ABDOMEN - 1 VIEW COMPARISON:  Yesterday and CT from 2 days ago FINDINGS: Fluid-filled small bowel throughout the abdomen, unchanged. No visible colonic contrast. IMPRESSION: Ongoing small bowel obstruction with unchanged diffuse fluid distended bowel loops. Electronically Signed   By: Monte Fantasia M.D.   On: 11/08/2017 07:34   Dg Abd Portable 1v  Result Date: 11/07/2017 CLINICAL DATA:  Abdominal pain, distention EXAM: PORTABLE ABDOMEN - 1 VIEW COMPARISON:  11/06/2017 FINDINGS: Oral contrast material again noted within dilated small bowel loops. No definite visible colonic contrast although the contrast is severely diluted at this point and difficult to  visualize. Contrast material within the pelvis likely within the bladder from prior IV contrast administration. No free air organomegaly. IMPRESSION: Dilute contrast appears to remain in dilated small bowel loops compatible with small bowel obstruction. No visible change. Electronically Signed   By: Rolm Baptise M.D.   On: 11/07/2017 09:10   Dg Abd Portable 1v-small Bowel Obstruction Protocol-initial, 8 Hr Delay  Result Date: 11/06/2017 CLINICAL DATA:  Evaluate small bowel obstruction. EXAM: PORTABLE ABDOMEN - 1 VIEW COMPARISON:  None. FINDINGS: Films are taken approximately 8 hours after patient ingested oral contrast. Contrast is noted within multiple dilated small bowel loops. No definite oral contrast noted within the colon. Bladder contrast from recent CT identified. IMPRESSION: Oral contrast noted within multiple dilated small bowel loops. No definite oral contrast noted within the colon. This is compatible with a high-grade small bowel obstruction given CT findings. Electronically Signed   By: Margarette Canada M.D.   On: 11/06/2017 22:05   Dg [name] Dys Tube Plc W/fl W/rad  Result Date: 11/06/2017 CLINICAL DATA:  85 year old male with small bowel obstruction. Esophageal cancer post resection. Subsequent encounter. EXAM: NASO G TUBE PLACEMENT WITH FL AND WITH RAD FLUOROSCOPY TIME:  Fluoroscopy Time:  16 seconds Radiation Exposure Index: 4.47 mGy. COMPARISON:  11/06/2017 CT. FINDINGS: Nasogastric tube gently advanced into the lower cervical esophagus. Patient would not tolerate advancement of nasogastric tube and study was terminated. IMPRESSION: Unsuccessful nasogastric tube placement. Electronically Signed   By: Alcide Evener.D.  On: 11/06/2017 16:43   [place] Ekg Site Rite  Result Date: 11/14/2017 If Site Rite image not attached, placement could not be confirmed due to current cardiac rhythm.   Microbiology: Recent Results (from the past 240 hour(s))  MRSA PCR Screening     Status: None   Collection  Time: 11/15/17  7:54 PM  Result Value Ref Range Status   MRSA by PCR NEGATIVE NEGATIVE Final    Comment:        The GeneXpert MRSA Assay (FDA approved for NASAL specimens only), is one component of a comprehensive MRSA colonization surveillance program. It is not intended to diagnose MRSA infection nor to guide or monitor treatment for MRSA infections. Performed at [facility], [place] [address], [place], [place] [address]   Culture, blood (routine x 2) Call MD if unable to obtain prior to antibiotics being given     Status: Abnormal   Collection Time: 11/15/17  9:01 PM  Result Value Ref Range Status   Specimen Description   Final    BLOOD LEFT ANTECUBITAL Performed at [facility] [address] [place], [place] [address]    Special Requests   Final    BOTTLES DRAWN AEROBIC AND ANAEROBIC Blood Culture adequate volume Performed at [facility] [address], [place], [place] [address]    Culture  Setup Time   Final    GRAM NEGATIVE RODS IN BOTH AEROBIC AND ANAEROBIC BOTTLES CRITICAL RESULT CALLED TO, READ BACK BY AND VERIFIED WITH: Sheffield Slider PharmD 14:15 11/16/17 ([email or web address]) Performed at [facility], [place] [address], [place], [place] [address]    Culture KLEBSIELLA OXYTOCA (A)  Final   Report Status 11/18/2017 FINAL  Final   Organism ID, Bacteria KLEBSIELLA OXYTOCA  Final      Susceptibility   Klebsiella oxytoca - MIC*    AMPICILLIN >=32 RESISTANT Resistant     CEFAZOLIN <=4 SENSITIVE Sensitive     CEFEPIME <=1 SENSITIVE Sensitive     CEFTAZIDIME <=1 SENSITIVE Sensitive     CEFTRIAXONE <=1 SENSITIVE Sensitive     CIPROFLOXACIN <=0.25 SENSITIVE Sensitive     GENTAMICIN <=1 SENSITIVE Sensitive     IMIPENEM <=0.25 SENSITIVE Sensitive     TRIMETH/SULFA <=20 SENSITIVE Sensitive     AMPICILLIN/SULBACTAM 4 SENSITIVE Sensitive     PIP/TAZO <=4 SENSITIVE Sensitive     * KLEBSIELLA OXYTOCA  Blood Culture ID Panel  (Reflexed)     Status: Abnormal   Collection Time: 11/15/17  9:01 PM  Result Value Ref Range Status   Enterococcus species NOT DETECTED NOT DETECTED Final   Listeria monocytogenes NOT DETECTED NOT DETECTED Final   Staphylococcus species NOT DETECTED NOT DETECTED Final   Staphylococcus aureus NOT DETECTED NOT DETECTED Final   Streptococcus species NOT DETECTED NOT DETECTED Final   Streptococcus agalactiae NOT DETECTED NOT DETECTED Final   Streptococcus pneumoniae NOT DETECTED NOT DETECTED Final   Streptococcus pyogenes NOT DETECTED NOT DETECTED Final   Acinetobacter baumannii NOT DETECTED NOT DETECTED Final   Enterobacteriaceae species DETECTED (A) NOT DETECTED Final    Comment: Enterobacteriaceae represent a large family of gram-negative bacteria, not a single organism. CRITICAL RESULT CALLED TO, READ BACK BY AND VERIFIED WITH: Sheffield Slider PharmD 14:15 11/16/17 ([email or web address])    Enterobacter cloacae complex NOT DETECTED NOT DETECTED Final   Escherichia coli NOT DETECTED NOT DETECTED Final   Klebsiella oxytoca DETECTED (A) NOT DETECTED Final    Comment: CRITICAL RESULT CALLED TO, READ  BACK BY AND VERIFIED WITH: Sheffield Slider PharmD 14:15 11/16/17 ([email or web address])    Klebsiella pneumoniae NOT DETECTED NOT DETECTED Final   Proteus species NOT DETECTED NOT DETECTED Final   Serratia marcescens NOT DETECTED NOT DETECTED Final   Carbapenem resistance NOT DETECTED NOT DETECTED Final   Haemophilus influenzae NOT DETECTED NOT DETECTED Final   Neisseria meningitidis NOT DETECTED NOT DETECTED Final   Pseudomonas aeruginosa NOT DETECTED NOT DETECTED Final   Candida albicans NOT DETECTED NOT DETECTED Final   Candida glabrata NOT DETECTED NOT DETECTED Final   Candida krusei NOT DETECTED NOT DETECTED Final   Candida parapsilosis NOT DETECTED NOT DETECTED Final   Candida tropicalis NOT DETECTED NOT DETECTED Final  Culture, blood (routine x 2) Call MD if unable to obtain prior to antibiotics being given      Status: Abnormal   Collection Time: 11/15/17  9:12 PM  Result Value Ref Range Status   Specimen Description   Final    BLOOD LEFT HAND Performed at [facility], [place] [address] [place], [place] [address]    Special Requests   Final    BOTTLES DRAWN AEROBIC AND ANAEROBIC Blood Culture adequate volume Performed at [facility] [address], [place], [place] [address]    Culture  Setup Time   Final    GRAM NEGATIVE RODS IN BOTH AEROBIC AND ANAEROBIC BOTTLES CRITICAL VALUE NOTED.  VALUE IS CONSISTENT WITH PREVIOUSLY REPORTED AND CALLED VALUE.    Culture (A)  Final    KLEBSIELLA OXYTOCA SUSCEPTIBILITIES PERFORMED ON PREVIOUS CULTURE WITHIN THE LAST 5 DAYS. Performed at [facility], [place] [address], [place], [place] [address]    Report Status 11/18/2017 FINAL  Final  Culture, Urine     Status: None   Collection Time: 11/15/17  9:40 PM  Result Value Ref Range Status   Specimen Description   Final    URINE, RANDOM Performed at [facility] [address] [place], [place] [address]    Special Requests   Final    NONE Performed at [facility], [place] [address], [place], [place] [address]    Culture   Final    NO GROWTH Performed at [facility], [place] [address], [place], [place] [address]    Report Status 11/17/2017 FINAL  Final  Culture, blood (routine x 2)     Status: None   Collection Time: 11/17/17 12:20 AM  Result Value Ref Range Status   Specimen Description   Final    BLOOD LEFT HAND Performed at [facility] [address], [place], [place] [address]    Special Requests   Final    BOTTLES DRAWN AEROBIC AND ANAEROBIC Blood Culture adequate volume Performed at [facility] [address], [place], [place] [address]    Culture   Final    NO GROWTH 5 DAYS Performed at [facility], [place] [address], [place], [place] [address]    Report  Status 11/22/2017 FINAL  Final  Culture, blood (routine x 2)     Status: None   Collection Time: 11/17/17 12:20 AM  Result Value Ref Range Status   Specimen Description   Final    BLOOD LEFT ANTECUBITAL Performed at [facility] [address] [place], [place] [address]    Special Requests   Final    BOTTLES DRAWN AEROBIC AND ANAEROBIC Blood Culture adequate volume Performed at [facility], [place] [address] [place], [place] [address]    Culture   Final    NO GROWTH 5 DAYS Performed at [facility], [place] [address] [place], [place] [address]    Report Status 11/22/2017 FINAL  Final     Labs: Basic Metabolic Panel: Recent Labs  Lab 11/17/17 0519 11/18/17 0544 11/19/17 0442 11/20/17 0509 11/21/17 0527 11/22/17 0521  NA 141 139 136 131* 134* 137  K 3.2* 3.6 3.3* 3.5 3.8 3.8  CL 106 103 101 99 100 102  CO2 29 28 27 25 25 26   GLUCOSE 248* 297* 241* 226* 224* 79  BUN 19 16 16 17 16 15   CREATININE 1.00 0.98 0.88 0.83 0.88 0.88  CALCIUM 7.9* 8.0* 7.9* 7.7* 8.1* 8.2*  MG 2.0 1.8 2.0 1.9  --   --   PHOS 2.4* 2.3* 3.0 3.4  --   --    Liver Function Tests: Recent Labs  Lab 11/19/17 0442 11/20/17 0509  AST 43* 28  ALT 33 30  ALKPHOS 76 67  BILITOT 1.1 0.7  PROT 5.4* 5.7*  ALBUMIN 2.2* 2.2*   No results for input(s): LIPASE, AMYLASE in the last 168 hours. No results for input(s): AMMONIA in the last 168 hours. CBC: Recent Labs  Lab 11/19/17 0442 11/20/17 0509 11/21/17 0527 11/22/17 0521 11/23/17 0820  WBC 23.2* 17.5* 10.4 11.1* 12.6*  NEUTROABS 19.7* 14.5* 7.8* 8.3 9.8  HGB 9.3* 8.9* 8.9* 9.5* 9.1*  HCT 27.3* 26.5* 26.3* 28.9* 27.8*  MCV 95.8 96.4 96.3 98.0 98.9  PLT 302 353 412* 543* 573*   Cardiac Enzymes: No results for input(s): CKTOTAL, CKMB, CKMBINDEX, TROPONINI in the last 168 hours. BNP: BNP (last 3 results) Recent Labs    11/15/17 2121 11/16/17 0516  BNP 566.1* 863.0*    ProBNP (last 3 results) No  results for input(s): PROBNP in the last 8760 hours.  CBG: Recent Labs  Lab 11/22/17 0743 11/22/17 1126 11/22/17 1643 11/22/17 2128 11/23/17 0743  GLUCAP 82 129* 87 120* 109*       Signed:  Nita Sells MD   [facility] 11/23/2017, 11:32 AM   \

## 2017-11-23 NOTE — Progress Notes (Signed)
Discharge planning, spoke with patient at bedside. Have chosen [facility] PT/RN/aide, evaluate and treat. Contacted [facility] for referral. Has RW at home. [phone]

## 2017-11-24 DIAGNOSIS — E440 Moderate protein-calorie malnutrition: Secondary | ICD-10-CM | POA: Diagnosis not present

## 2017-11-24 DIAGNOSIS — K5730 Diverticulosis of large intestine without perforation or abscess without bleeding: Secondary | ICD-10-CM | POA: Diagnosis not present

## 2017-11-24 DIAGNOSIS — J690 Pneumonitis due to inhalation of food and vomit: Secondary | ICD-10-CM | POA: Diagnosis not present

## 2017-11-24 DIAGNOSIS — G4733 Obstructive sleep apnea (adult) (pediatric): Secondary | ICD-10-CM | POA: Diagnosis not present

## 2017-11-24 DIAGNOSIS — E119 Type 2 diabetes mellitus without complications: Secondary | ICD-10-CM | POA: Diagnosis not present

## 2017-11-24 DIAGNOSIS — I082 Rheumatic disorders of both aortic and tricuspid valves: Secondary | ICD-10-CM | POA: Diagnosis not present

## 2017-11-24 DIAGNOSIS — I119 Hypertensive heart disease without heart failure: Secondary | ICD-10-CM | POA: Diagnosis not present

## 2017-11-24 DIAGNOSIS — M109 Gout, unspecified: Secondary | ICD-10-CM | POA: Diagnosis not present

## 2017-11-24 DIAGNOSIS — B961 Klebsiella pneumoniae [K. pneumoniae] as the cause of diseases classified elsewhere: Secondary | ICD-10-CM | POA: Diagnosis not present

## 2017-11-24 DIAGNOSIS — J9611 Chronic respiratory failure with hypoxia: Secondary | ICD-10-CM | POA: Diagnosis not present

## 2017-11-24 DIAGNOSIS — K2270 Barrett's esophagus without dysplasia: Secondary | ICD-10-CM | POA: Diagnosis not present

## 2017-11-24 DIAGNOSIS — M1991 Primary osteoarthritis, unspecified site: Secondary | ICD-10-CM | POA: Diagnosis not present

## 2017-11-24 DIAGNOSIS — I440 Atrioventricular block, first degree: Secondary | ICD-10-CM | POA: Diagnosis not present

## 2017-11-27 ENCOUNTER — Other Ambulatory Visit: Payer: Self-pay

## 2017-11-27 NOTE — Patient Outreach (Signed)
[facility]) Care Management  11/27/2017  Oscar [name] [facility] 1932-07-23 [id number]     EMMI-GENERAL DISCHARGE RED ON EMMI ALERT Day # 1 Date: 11/25/17 Red Alert Reason: " Scheduled follow up? No"   Outreach attempt # 1. Spoke with patient who voices he is doing fairly well. Reviewed and addressed red alert with patient. He does not recall getting automated post discharge call. 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 He is aware to make an appt with PCP and GI-Dr. Henrene Pastor. Patient voices he will do so. No issues with transportation reported. He lives with spouse. Patient confirmed that [facility] nurse has been out to assess him. He denies any further RN CM needs or concerns at this time.        Plan: RN CM will close case at this time as no further interventions needed.   Enzo Montgomery, RN,BSN,CCM [place] Management Telephonic Care Management Coordinator Direct Phone: [phone] Toll Free: [phone] Fax: [phone]

## 2017-11-28 DIAGNOSIS — K219 Gastro-esophageal reflux disease without esophagitis: Secondary | ICD-10-CM | POA: Diagnosis not present

## 2017-11-28 DIAGNOSIS — I082 Rheumatic disorders of both aortic and tricuspid valves: Secondary | ICD-10-CM | POA: Diagnosis not present

## 2017-11-28 DIAGNOSIS — K5730 Diverticulosis of large intestine without perforation or abscess without bleeding: Secondary | ICD-10-CM | POA: Diagnosis not present

## 2017-11-28 DIAGNOSIS — J690 Pneumonitis due to inhalation of food and vomit: Secondary | ICD-10-CM | POA: Diagnosis not present

## 2017-11-28 DIAGNOSIS — E119 Type 2 diabetes mellitus without complications: Secondary | ICD-10-CM | POA: Diagnosis not present

## 2017-11-28 DIAGNOSIS — G4733 Obstructive sleep apnea (adult) (pediatric): Secondary | ICD-10-CM | POA: Diagnosis not present

## 2017-11-28 DIAGNOSIS — M109 Gout, unspecified: Secondary | ICD-10-CM | POA: Diagnosis not present

## 2017-11-28 DIAGNOSIS — N401 Enlarged prostate with lower urinary tract symptoms: Secondary | ICD-10-CM | POA: Diagnosis not present

## 2017-11-28 DIAGNOSIS — J9611 Chronic respiratory failure with hypoxia: Secondary | ICD-10-CM | POA: Diagnosis not present

## 2017-11-28 DIAGNOSIS — B961 Klebsiella pneumoniae [K. pneumoniae] as the cause of diseases classified elsewhere: Secondary | ICD-10-CM | POA: Diagnosis not present

## 2017-11-28 DIAGNOSIS — I119 Hypertensive heart disease without heart failure: Secondary | ICD-10-CM | POA: Diagnosis not present

## 2017-11-28 DIAGNOSIS — I10 Essential (primary) hypertension: Secondary | ICD-10-CM | POA: Diagnosis not present

## 2017-11-28 DIAGNOSIS — M1991 Primary osteoarthritis, unspecified site: Secondary | ICD-10-CM | POA: Diagnosis not present

## 2017-11-28 DIAGNOSIS — I440 Atrioventricular block, first degree: Secondary | ICD-10-CM | POA: Diagnosis not present

## 2017-11-28 DIAGNOSIS — Z09 Encounter for follow-up examination after completed treatment for conditions other than malignant neoplasm: Secondary | ICD-10-CM | POA: Diagnosis not present

## 2017-11-28 DIAGNOSIS — K2270 Barrett's esophagus without dysplasia: Secondary | ICD-10-CM | POA: Diagnosis not present

## 2017-11-28 DIAGNOSIS — E440 Moderate protein-calorie malnutrition: Secondary | ICD-10-CM | POA: Diagnosis not present

## 2017-11-28 DIAGNOSIS — D638 Anemia in other chronic diseases classified elsewhere: Secondary | ICD-10-CM | POA: Diagnosis not present

## 2017-11-30 DIAGNOSIS — N401 Enlarged prostate with lower urinary tract symptoms: Secondary | ICD-10-CM | POA: Diagnosis not present

## 2017-11-30 DIAGNOSIS — R3121 Asymptomatic microscopic hematuria: Secondary | ICD-10-CM | POA: Diagnosis not present

## 2017-11-30 DIAGNOSIS — R350 Frequency of micturition: Secondary | ICD-10-CM | POA: Diagnosis not present

## 2017-11-30 DIAGNOSIS — R3915 Urgency of urination: Secondary | ICD-10-CM | POA: Diagnosis not present

## 2017-12-04 DIAGNOSIS — M1991 Primary osteoarthritis, unspecified site: Secondary | ICD-10-CM | POA: Diagnosis not present

## 2017-12-04 DIAGNOSIS — B961 Klebsiella pneumoniae [K. pneumoniae] as the cause of diseases classified elsewhere: Secondary | ICD-10-CM | POA: Diagnosis not present

## 2017-12-04 DIAGNOSIS — K2270 Barrett's esophagus without dysplasia: Secondary | ICD-10-CM | POA: Diagnosis not present

## 2017-12-04 DIAGNOSIS — J9611 Chronic respiratory failure with hypoxia: Secondary | ICD-10-CM | POA: Diagnosis not present

## 2017-12-04 DIAGNOSIS — M109 Gout, unspecified: Secondary | ICD-10-CM | POA: Diagnosis not present

## 2017-12-04 DIAGNOSIS — I440 Atrioventricular block, first degree: Secondary | ICD-10-CM | POA: Diagnosis not present

## 2017-12-04 DIAGNOSIS — I119 Hypertensive heart disease without heart failure: Secondary | ICD-10-CM | POA: Diagnosis not present

## 2017-12-04 DIAGNOSIS — I082 Rheumatic disorders of both aortic and tricuspid valves: Secondary | ICD-10-CM | POA: Diagnosis not present

## 2017-12-04 DIAGNOSIS — G4733 Obstructive sleep apnea (adult) (pediatric): Secondary | ICD-10-CM | POA: Diagnosis not present

## 2017-12-04 DIAGNOSIS — E119 Type 2 diabetes mellitus without complications: Secondary | ICD-10-CM | POA: Diagnosis not present

## 2017-12-04 DIAGNOSIS — E440 Moderate protein-calorie malnutrition: Secondary | ICD-10-CM | POA: Diagnosis not present

## 2017-12-04 DIAGNOSIS — J690 Pneumonitis due to inhalation of food and vomit: Secondary | ICD-10-CM | POA: Diagnosis not present

## 2017-12-04 DIAGNOSIS — K5730 Diverticulosis of large intestine without perforation or abscess without bleeding: Secondary | ICD-10-CM | POA: Diagnosis not present

## 2017-12-07 DIAGNOSIS — I082 Rheumatic disorders of both aortic and tricuspid valves: Secondary | ICD-10-CM | POA: Diagnosis not present

## 2017-12-07 DIAGNOSIS — J9611 Chronic respiratory failure with hypoxia: Secondary | ICD-10-CM | POA: Diagnosis not present

## 2017-12-07 DIAGNOSIS — M109 Gout, unspecified: Secondary | ICD-10-CM | POA: Diagnosis not present

## 2017-12-07 DIAGNOSIS — I440 Atrioventricular block, first degree: Secondary | ICD-10-CM | POA: Diagnosis not present

## 2017-12-07 DIAGNOSIS — K2270 Barrett's esophagus without dysplasia: Secondary | ICD-10-CM | POA: Diagnosis not present

## 2017-12-07 DIAGNOSIS — E119 Type 2 diabetes mellitus without complications: Secondary | ICD-10-CM | POA: Diagnosis not present

## 2017-12-07 DIAGNOSIS — M1991 Primary osteoarthritis, unspecified site: Secondary | ICD-10-CM | POA: Diagnosis not present

## 2017-12-07 DIAGNOSIS — K5730 Diverticulosis of large intestine without perforation or abscess without bleeding: Secondary | ICD-10-CM | POA: Diagnosis not present

## 2017-12-07 DIAGNOSIS — J690 Pneumonitis due to inhalation of food and vomit: Secondary | ICD-10-CM | POA: Diagnosis not present

## 2017-12-07 DIAGNOSIS — I119 Hypertensive heart disease without heart failure: Secondary | ICD-10-CM | POA: Diagnosis not present

## 2017-12-07 DIAGNOSIS — B961 Klebsiella pneumoniae [K. pneumoniae] as the cause of diseases classified elsewhere: Secondary | ICD-10-CM | POA: Diagnosis not present

## 2017-12-07 DIAGNOSIS — G4733 Obstructive sleep apnea (adult) (pediatric): Secondary | ICD-10-CM | POA: Diagnosis not present

## 2017-12-07 DIAGNOSIS — E440 Moderate protein-calorie malnutrition: Secondary | ICD-10-CM | POA: Diagnosis not present

## 2017-12-13 DIAGNOSIS — K2270 Barrett's esophagus without dysplasia: Secondary | ICD-10-CM | POA: Diagnosis not present

## 2017-12-13 DIAGNOSIS — K5730 Diverticulosis of large intestine without perforation or abscess without bleeding: Secondary | ICD-10-CM | POA: Diagnosis not present

## 2017-12-13 DIAGNOSIS — B961 Klebsiella pneumoniae [K. pneumoniae] as the cause of diseases classified elsewhere: Secondary | ICD-10-CM | POA: Diagnosis not present

## 2017-12-13 DIAGNOSIS — G4733 Obstructive sleep apnea (adult) (pediatric): Secondary | ICD-10-CM | POA: Diagnosis not present

## 2017-12-13 DIAGNOSIS — E440 Moderate protein-calorie malnutrition: Secondary | ICD-10-CM | POA: Diagnosis not present

## 2017-12-13 DIAGNOSIS — I119 Hypertensive heart disease without heart failure: Secondary | ICD-10-CM | POA: Diagnosis not present

## 2017-12-13 DIAGNOSIS — M1991 Primary osteoarthritis, unspecified site: Secondary | ICD-10-CM | POA: Diagnosis not present

## 2017-12-13 DIAGNOSIS — M109 Gout, unspecified: Secondary | ICD-10-CM | POA: Diagnosis not present

## 2017-12-13 DIAGNOSIS — J690 Pneumonitis due to inhalation of food and vomit: Secondary | ICD-10-CM | POA: Diagnosis not present

## 2017-12-13 DIAGNOSIS — I440 Atrioventricular block, first degree: Secondary | ICD-10-CM | POA: Diagnosis not present

## 2017-12-13 DIAGNOSIS — E119 Type 2 diabetes mellitus without complications: Secondary | ICD-10-CM | POA: Diagnosis not present

## 2017-12-13 DIAGNOSIS — I082 Rheumatic disorders of both aortic and tricuspid valves: Secondary | ICD-10-CM | POA: Diagnosis not present

## 2017-12-13 DIAGNOSIS — J9611 Chronic respiratory failure with hypoxia: Secondary | ICD-10-CM | POA: Diagnosis not present

## 2017-12-19 DIAGNOSIS — J690 Pneumonitis due to inhalation of food and vomit: Secondary | ICD-10-CM | POA: Diagnosis not present

## 2017-12-19 DIAGNOSIS — G4733 Obstructive sleep apnea (adult) (pediatric): Secondary | ICD-10-CM | POA: Diagnosis not present

## 2017-12-19 DIAGNOSIS — K2270 Barrett's esophagus without dysplasia: Secondary | ICD-10-CM | POA: Diagnosis not present

## 2017-12-19 DIAGNOSIS — M1991 Primary osteoarthritis, unspecified site: Secondary | ICD-10-CM | POA: Diagnosis not present

## 2017-12-19 DIAGNOSIS — M109 Gout, unspecified: Secondary | ICD-10-CM | POA: Diagnosis not present

## 2017-12-19 DIAGNOSIS — I119 Hypertensive heart disease without heart failure: Secondary | ICD-10-CM | POA: Diagnosis not present

## 2017-12-19 DIAGNOSIS — K5730 Diverticulosis of large intestine without perforation or abscess without bleeding: Secondary | ICD-10-CM | POA: Diagnosis not present

## 2017-12-19 DIAGNOSIS — B961 Klebsiella pneumoniae [K. pneumoniae] as the cause of diseases classified elsewhere: Secondary | ICD-10-CM | POA: Diagnosis not present

## 2017-12-19 DIAGNOSIS — E119 Type 2 diabetes mellitus without complications: Secondary | ICD-10-CM | POA: Diagnosis not present

## 2017-12-19 DIAGNOSIS — E440 Moderate protein-calorie malnutrition: Secondary | ICD-10-CM | POA: Diagnosis not present

## 2017-12-19 DIAGNOSIS — I440 Atrioventricular block, first degree: Secondary | ICD-10-CM | POA: Diagnosis not present

## 2017-12-19 DIAGNOSIS — J9611 Chronic respiratory failure with hypoxia: Secondary | ICD-10-CM | POA: Diagnosis not present

## 2017-12-19 DIAGNOSIS — I082 Rheumatic disorders of both aortic and tricuspid valves: Secondary | ICD-10-CM | POA: Diagnosis not present

## 2017-12-26 ENCOUNTER — Encounter (INDEPENDENT_AMBULATORY_CARE_PROVIDER_SITE_OTHER): Payer: Self-pay | Admitting: Surgery

## 2017-12-26 NOTE — Progress Notes (Signed)
Left without being seen.

## 2017-12-27 DIAGNOSIS — J690 Pneumonitis due to inhalation of food and vomit: Secondary | ICD-10-CM | POA: Diagnosis not present

## 2017-12-27 DIAGNOSIS — K2270 Barrett's esophagus without dysplasia: Secondary | ICD-10-CM | POA: Diagnosis not present

## 2017-12-27 DIAGNOSIS — E440 Moderate protein-calorie malnutrition: Secondary | ICD-10-CM | POA: Diagnosis not present

## 2017-12-27 DIAGNOSIS — E119 Type 2 diabetes mellitus without complications: Secondary | ICD-10-CM | POA: Diagnosis not present

## 2017-12-27 DIAGNOSIS — M109 Gout, unspecified: Secondary | ICD-10-CM | POA: Diagnosis not present

## 2017-12-27 DIAGNOSIS — I119 Hypertensive heart disease without heart failure: Secondary | ICD-10-CM | POA: Diagnosis not present

## 2017-12-27 DIAGNOSIS — B961 Klebsiella pneumoniae [K. pneumoniae] as the cause of diseases classified elsewhere: Secondary | ICD-10-CM | POA: Diagnosis not present

## 2017-12-27 DIAGNOSIS — I440 Atrioventricular block, first degree: Secondary | ICD-10-CM | POA: Diagnosis not present

## 2017-12-27 DIAGNOSIS — J9611 Chronic respiratory failure with hypoxia: Secondary | ICD-10-CM | POA: Diagnosis not present

## 2017-12-27 DIAGNOSIS — M1991 Primary osteoarthritis, unspecified site: Secondary | ICD-10-CM | POA: Diagnosis not present

## 2017-12-27 DIAGNOSIS — K5730 Diverticulosis of large intestine without perforation or abscess without bleeding: Secondary | ICD-10-CM | POA: Diagnosis not present

## 2017-12-27 DIAGNOSIS — G4733 Obstructive sleep apnea (adult) (pediatric): Secondary | ICD-10-CM | POA: Diagnosis not present

## 2017-12-27 DIAGNOSIS — I082 Rheumatic disorders of both aortic and tricuspid valves: Secondary | ICD-10-CM | POA: Diagnosis not present

## 2018-01-02 DIAGNOSIS — E119 Type 2 diabetes mellitus without complications: Secondary | ICD-10-CM | POA: Diagnosis not present

## 2018-01-02 DIAGNOSIS — K921 Melena: Secondary | ICD-10-CM | POA: Diagnosis not present

## 2018-01-02 DIAGNOSIS — I10 Essential (primary) hypertension: Secondary | ICD-10-CM | POA: Diagnosis not present

## 2018-01-03 DIAGNOSIS — K5730 Diverticulosis of large intestine without perforation or abscess without bleeding: Secondary | ICD-10-CM | POA: Diagnosis not present

## 2018-01-03 DIAGNOSIS — J9611 Chronic respiratory failure with hypoxia: Secondary | ICD-10-CM | POA: Diagnosis not present

## 2018-01-03 DIAGNOSIS — K2270 Barrett's esophagus without dysplasia: Secondary | ICD-10-CM | POA: Diagnosis not present

## 2018-01-03 DIAGNOSIS — E440 Moderate protein-calorie malnutrition: Secondary | ICD-10-CM | POA: Diagnosis not present

## 2018-01-03 DIAGNOSIS — M1991 Primary osteoarthritis, unspecified site: Secondary | ICD-10-CM | POA: Diagnosis not present

## 2018-01-03 DIAGNOSIS — G4733 Obstructive sleep apnea (adult) (pediatric): Secondary | ICD-10-CM | POA: Diagnosis not present

## 2018-01-03 DIAGNOSIS — E119 Type 2 diabetes mellitus without complications: Secondary | ICD-10-CM | POA: Diagnosis not present

## 2018-01-03 DIAGNOSIS — I119 Hypertensive heart disease without heart failure: Secondary | ICD-10-CM | POA: Diagnosis not present

## 2018-01-03 DIAGNOSIS — J690 Pneumonitis due to inhalation of food and vomit: Secondary | ICD-10-CM | POA: Diagnosis not present

## 2018-01-03 DIAGNOSIS — B961 Klebsiella pneumoniae [K. pneumoniae] as the cause of diseases classified elsewhere: Secondary | ICD-10-CM | POA: Diagnosis not present

## 2018-01-03 DIAGNOSIS — I082 Rheumatic disorders of both aortic and tricuspid valves: Secondary | ICD-10-CM | POA: Diagnosis not present

## 2018-01-03 DIAGNOSIS — M109 Gout, unspecified: Secondary | ICD-10-CM | POA: Diagnosis not present

## 2018-01-03 DIAGNOSIS — I440 Atrioventricular block, first degree: Secondary | ICD-10-CM | POA: Diagnosis not present

## 2018-01-10 ENCOUNTER — Ambulatory Visit: Payer: Medicare Other | Admitting: Nurse Practitioner

## 2018-01-10 ENCOUNTER — Encounter: Payer: Self-pay | Admitting: Nurse Practitioner

## 2018-01-10 VITALS — BP 128/52 | HR 64 | Ht 74.0 in | Wt 184.4 lb

## 2018-01-10 DIAGNOSIS — M109 Gout, unspecified: Secondary | ICD-10-CM | POA: Diagnosis not present

## 2018-01-10 DIAGNOSIS — K5730 Diverticulosis of large intestine without perforation or abscess without bleeding: Secondary | ICD-10-CM | POA: Diagnosis not present

## 2018-01-10 DIAGNOSIS — E440 Moderate protein-calorie malnutrition: Secondary | ICD-10-CM | POA: Diagnosis not present

## 2018-01-10 DIAGNOSIS — K2270 Barrett's esophagus without dysplasia: Secondary | ICD-10-CM

## 2018-01-10 DIAGNOSIS — R1310 Dysphagia, unspecified: Secondary | ICD-10-CM | POA: Diagnosis not present

## 2018-01-10 DIAGNOSIS — J9611 Chronic respiratory failure with hypoxia: Secondary | ICD-10-CM | POA: Diagnosis not present

## 2018-01-10 DIAGNOSIS — M1991 Primary osteoarthritis, unspecified site: Secondary | ICD-10-CM | POA: Diagnosis not present

## 2018-01-10 DIAGNOSIS — B961 Klebsiella pneumoniae [K. pneumoniae] as the cause of diseases classified elsewhere: Secondary | ICD-10-CM | POA: Diagnosis not present

## 2018-01-10 DIAGNOSIS — J690 Pneumonitis due to inhalation of food and vomit: Secondary | ICD-10-CM | POA: Diagnosis not present

## 2018-01-10 DIAGNOSIS — G4733 Obstructive sleep apnea (adult) (pediatric): Secondary | ICD-10-CM | POA: Diagnosis not present

## 2018-01-10 DIAGNOSIS — I082 Rheumatic disorders of both aortic and tricuspid valves: Secondary | ICD-10-CM | POA: Diagnosis not present

## 2018-01-10 DIAGNOSIS — Z8501 Personal history of malignant neoplasm of esophagus: Secondary | ICD-10-CM

## 2018-01-10 DIAGNOSIS — E119 Type 2 diabetes mellitus without complications: Secondary | ICD-10-CM | POA: Diagnosis not present

## 2018-01-10 DIAGNOSIS — I440 Atrioventricular block, first degree: Secondary | ICD-10-CM | POA: Diagnosis not present

## 2018-01-10 DIAGNOSIS — I119 Hypertensive heart disease without heart failure: Secondary | ICD-10-CM | POA: Diagnosis not present

## 2018-01-10 NOTE — Progress Notes (Signed)
Assessment and plan reviewed 

## 2018-01-10 NOTE — Patient Instructions (Signed)
If you are age 82 or older, your body mass index should be between 23-30. Your Body mass index is 23.67 kg/m. If this is out of the aforementioned range listed, please consider follow up with your Primary Care Provider.  If you are age 64 or younger, your body mass index should be between 19-25. Your Body mass index is 23.67 kg/m. If this is out of the aformentioned range listed, please consider follow up with your Primary Care Provider.   We will call you.  Thank you for choosing me and [facility].   Tye Savoy, NP

## 2018-01-10 NOTE — Progress Notes (Signed)
GI Provider:  Primary GI:   Scarlette Shorts, MD   Chief Complaint:  Abdominal pain / dysphagia    IMPRESSION and PLAN:    #1. 84 yo male with GERD / Barrett's esophagus and remote hx of esophageal cancer, s/p resection. Aged out of surveillance. He has intermittent solid food dysphagia. Episodes occur 1-2 times a month. He tries to chew well, eat slowly. Barium swallow in July was normal. He inquires about an esophageal "scraping procedure" that could help with swallowing problems.  -Not sure what procedure he is referring to, may RFA ablation? He has Barrett's esophagus but no dysplasia on last biopsy and it isn't applicable to his problems anyway. would help his swallowing anyway.  -Overall patient feels his dysphagia is manageable.  Encouraged him to continue dysphagia precautions -Continue PPI which I told him we would refill if [facility] wouldn't     #2.  Weight loss. Per EMR weight was ~200 pounds mid July, down to 184 but he was hospitalized several days with SBO during this time  #3. Recent PNA, aspiration?  Probably secondary to N/V with the SBO. He denies coughing with meals in general.       HPI:     Patient is an 84 year old male with multiple problems.  He has a history of chronic constipation, SBO, a hx of GERD complicated by Barrett's esophagus with esophageal cancer, status post remote esophagectomy. He has aged out of surveillance.  Patient was last seen in May of this year following a small bowel obstruction.  At that time he was continued on MiraLAX for chronic constipation.  His PPI was continued, as were antireflux measures.  He was advised to follow-up with [place] on an as-needed basis..   Patient was hospitalized again in early July with another SBO which resolved with conservative measures. It was a prolonged hospitalization, he required TNA. Hospitalization complicated by klebsiella bacteremia felt to be secondary to aspiration PNA. He had an inpatient esophogram revealing post  partial esophagectomy with gastric pull up. Smooth appearance of the anastomotic site without irregularity, stricture or leak identified. Delayed emptying from the gastric pull up into dilated small bowel loops consistent with  small-bowel obstruction.Transient laryngeal penetration.  Right now patient says he feels well. No N/V. Taking Miralax and dulcolax and bowels moving well. He continues to have intermittent dysphagia, mainly to solids but sometimes liquids. Episodes occur about 1-2 times a month so it is "manageable". He takes small bites, feels like it "opens esophagus" and helps food pass. He inquires about a "scraping procedure" to scrape off some of the bad parts of the esophagus. He has had some issues getting PPI from [place] recently but hasn't missed any doses.   Review of systems:     No chest pain, no SOB, no fevers, no urinary sx   Past Medical History:  Diagnosis Date  . Acid reflux   . Barrett's esophagus   . BPH (benign prostatic hyperplasia)   . Colon polyps   . DDD (degenerative disc disease)   . Diabetes mellitus    controlled with diet and exercise  . ED (erectile dysfunction)   . Esophageal cancer ([place])    tx'd surgery - abdominal & right posterior chest approach (2001) - [place], [place]  . First degree AV block   . Gout   . Hemorrhoids   . History of palpitations   . Hyperlipidemia   . Hypertension   . Lipoma   . Obesity   .  Osteoarthritis   . Tinea versicolor   . Tinnitus     Patient's surgical history, family medical history, social history, medications and allergies were all reviewed in Epic   Creatinine clearance cannot be calculated (Patient's most recent lab result is older than the maximum 21 days allowed.)  Current Outpatient Medications  Medication Sig Dispense Refill  . alprostadil (MUSE) 1000 MCG pellet 1,000 mcg by Transurethral route daily as needed for erectile dysfunction. use no more than 3 times per week    . atenolol (TENORMIN) 25 MG tablet  Take 1 tablet (25 mg total) by mouth daily. 30 tablet 11  . bisacodyl (DULCOLAX) 5 MG EC tablet Take 5 mg by mouth at bedtime as needed for moderate constipation.    . finasteride (PROSCAR) 5 MG tablet Take 1 tablet (5 mg total) by mouth daily. 30 tablet 1  . HYDROcodone-acetaminophen (NORCO/VICODIN) 5-325 MG per tablet Take 1 tablet by mouth every 6 (six) hours as needed for moderate pain.    [name] losartan-hydrochlorothiazide (HYZAAR) 100-12.5 MG tablet Take 0.5 tablets by mouth daily.   4  . omeprazole (PRILOSEC) 20 MG capsule Take 40 mg by mouth 2 (two) times daily before a meal.     . polyethylene glycol (MIRALAX / GLYCOLAX) packet Take 17 g by mouth at bedtime as needed for mild constipation.      No current facility-administered medications for this visit.     Physical Exam:     BP (!) 128/52   Pulse 64   Ht 6\' 2"  (1.88 m)   Wt 184 lb 6 oz (83.6 kg)   BMI 23.67 kg/m   GENERAL:  Pleasant thin male in NAD PSYCH: : Cooperative, normal affect EENT:  conjunctiva pink, mucous membranes moist, neck supple without masses CARDIAC:  RRR, no murmur heard, no peripheral edema PULM: Normal respiratory effort, lungs CTA bilaterally, no wheezing ABDOMEN:  Nondistended, soft, nontender. No obvious masses, no hepatomegaly,  normal bowel sounds SKIN:  turgor, no lesions seen Musculoskeletal:  Normal muscle tone, normal strength NEURO: Alert and oriented x 3, no focal neurologic deficits   Tye Savoy , NP 01/10/2018, 2:15 PM

## 2018-01-16 DIAGNOSIS — G4733 Obstructive sleep apnea (adult) (pediatric): Secondary | ICD-10-CM | POA: Diagnosis not present

## 2018-01-17 DIAGNOSIS — M109 Gout, unspecified: Secondary | ICD-10-CM | POA: Diagnosis not present

## 2018-01-17 DIAGNOSIS — G4733 Obstructive sleep apnea (adult) (pediatric): Secondary | ICD-10-CM | POA: Diagnosis not present

## 2018-01-17 DIAGNOSIS — K5730 Diverticulosis of large intestine without perforation or abscess without bleeding: Secondary | ICD-10-CM | POA: Diagnosis not present

## 2018-01-17 DIAGNOSIS — B961 Klebsiella pneumoniae [K. pneumoniae] as the cause of diseases classified elsewhere: Secondary | ICD-10-CM | POA: Diagnosis not present

## 2018-01-17 DIAGNOSIS — M1991 Primary osteoarthritis, unspecified site: Secondary | ICD-10-CM | POA: Diagnosis not present

## 2018-01-17 DIAGNOSIS — E119 Type 2 diabetes mellitus without complications: Secondary | ICD-10-CM | POA: Diagnosis not present

## 2018-01-17 DIAGNOSIS — K2270 Barrett's esophagus without dysplasia: Secondary | ICD-10-CM | POA: Diagnosis not present

## 2018-01-17 DIAGNOSIS — I119 Hypertensive heart disease without heart failure: Secondary | ICD-10-CM | POA: Diagnosis not present

## 2018-01-17 DIAGNOSIS — E440 Moderate protein-calorie malnutrition: Secondary | ICD-10-CM | POA: Diagnosis not present

## 2018-01-17 DIAGNOSIS — I082 Rheumatic disorders of both aortic and tricuspid valves: Secondary | ICD-10-CM | POA: Diagnosis not present

## 2018-01-17 DIAGNOSIS — J690 Pneumonitis due to inhalation of food and vomit: Secondary | ICD-10-CM | POA: Diagnosis not present

## 2018-01-17 DIAGNOSIS — I440 Atrioventricular block, first degree: Secondary | ICD-10-CM | POA: Diagnosis not present

## 2018-01-17 DIAGNOSIS — J9611 Chronic respiratory failure with hypoxia: Secondary | ICD-10-CM | POA: Diagnosis not present

## 2018-01-30 ENCOUNTER — Other Ambulatory Visit: Payer: Self-pay

## 2018-01-30 NOTE — Patient Outreach (Signed)
[facility]) Care Management  01/30/2018  [facility] 12-13-1932 [id number]   Medication Adherence call to Mr. Kirke Shaggy left a message for patient to call back patient is due on Losartan/ Hctz 100/12.5 mg. Mr. Frizell is showing past due under [place].  [place] Management Direct Dial [phone]  Fax [phone] [email or web address]

## 2018-02-02 DIAGNOSIS — Z23 Encounter for immunization: Secondary | ICD-10-CM | POA: Diagnosis not present

## 2018-02-02 DIAGNOSIS — K8020 Calculus of gallbladder without cholecystitis without obstruction: Secondary | ICD-10-CM | POA: Diagnosis not present

## 2018-02-02 DIAGNOSIS — D649 Anemia, unspecified: Secondary | ICD-10-CM | POA: Diagnosis not present

## 2018-02-02 DIAGNOSIS — Z8719 Personal history of other diseases of the digestive system: Secondary | ICD-10-CM | POA: Diagnosis not present

## 2018-02-05 DIAGNOSIS — H25812 Combined forms of age-related cataract, left eye: Secondary | ICD-10-CM | POA: Diagnosis not present

## 2018-02-05 DIAGNOSIS — H40013 Open angle with borderline findings, low risk, bilateral: Secondary | ICD-10-CM | POA: Diagnosis not present

## 2018-02-05 DIAGNOSIS — H04123 Dry eye syndrome of bilateral lacrimal glands: Secondary | ICD-10-CM | POA: Diagnosis not present

## 2018-02-05 DIAGNOSIS — E119 Type 2 diabetes mellitus without complications: Secondary | ICD-10-CM | POA: Diagnosis not present

## 2018-02-05 DIAGNOSIS — Z961 Presence of intraocular lens: Secondary | ICD-10-CM | POA: Diagnosis not present

## 2018-02-21 DIAGNOSIS — J690 Pneumonitis due to inhalation of food and vomit: Secondary | ICD-10-CM | POA: Diagnosis not present

## 2018-02-21 DIAGNOSIS — B961 Klebsiella pneumoniae [K. pneumoniae] as the cause of diseases classified elsewhere: Secondary | ICD-10-CM | POA: Diagnosis not present

## 2018-02-21 DIAGNOSIS — I119 Hypertensive heart disease without heart failure: Secondary | ICD-10-CM | POA: Diagnosis not present

## 2018-02-21 DIAGNOSIS — J9611 Chronic respiratory failure with hypoxia: Secondary | ICD-10-CM | POA: Diagnosis not present

## 2018-04-27 DIAGNOSIS — R600 Localized edema: Secondary | ICD-10-CM | POA: Diagnosis not present

## 2018-04-27 DIAGNOSIS — E118 Type 2 diabetes mellitus with unspecified complications: Secondary | ICD-10-CM | POA: Diagnosis not present

## 2018-04-27 DIAGNOSIS — I10 Essential (primary) hypertension: Secondary | ICD-10-CM | POA: Diagnosis not present

## 2018-05-03 DIAGNOSIS — R079 Chest pain, unspecified: Secondary | ICD-10-CM | POA: Diagnosis not present

## 2018-05-03 DIAGNOSIS — R071 Chest pain on breathing: Secondary | ICD-10-CM | POA: Diagnosis not present

## 2018-05-03 DIAGNOSIS — L03115 Cellulitis of right lower limb: Secondary | ICD-10-CM | POA: Diagnosis not present

## 2018-05-04 DIAGNOSIS — R079 Chest pain, unspecified: Secondary | ICD-10-CM | POA: Diagnosis not present

## 2018-05-04 DIAGNOSIS — J189 Pneumonia, unspecified organism: Secondary | ICD-10-CM | POA: Diagnosis not present

## 2018-05-04 DIAGNOSIS — L03115 Cellulitis of right lower limb: Secondary | ICD-10-CM | POA: Diagnosis not present

## 2018-05-08 DIAGNOSIS — R079 Chest pain, unspecified: Secondary | ICD-10-CM | POA: Diagnosis not present

## 2018-05-08 DIAGNOSIS — L03115 Cellulitis of right lower limb: Secondary | ICD-10-CM | POA: Diagnosis not present

## 2018-05-30 DIAGNOSIS — R9389 Abnormal findings on diagnostic imaging of other specified body structures: Secondary | ICD-10-CM | POA: Diagnosis not present

## 2018-05-30 DIAGNOSIS — R05 Cough: Secondary | ICD-10-CM | POA: Diagnosis not present

## 2018-07-04 DIAGNOSIS — M9904 Segmental and somatic dysfunction of sacral region: Secondary | ICD-10-CM | POA: Diagnosis not present

## 2018-07-04 DIAGNOSIS — M9903 Segmental and somatic dysfunction of lumbar region: Secondary | ICD-10-CM | POA: Diagnosis not present

## 2018-07-04 DIAGNOSIS — M9905 Segmental and somatic dysfunction of pelvic region: Secondary | ICD-10-CM | POA: Diagnosis not present

## 2018-07-04 DIAGNOSIS — M5136 Other intervertebral disc degeneration, lumbar region: Secondary | ICD-10-CM | POA: Diagnosis not present

## 2018-07-05 DIAGNOSIS — M9903 Segmental and somatic dysfunction of lumbar region: Secondary | ICD-10-CM | POA: Diagnosis not present

## 2018-07-05 DIAGNOSIS — M9905 Segmental and somatic dysfunction of pelvic region: Secondary | ICD-10-CM | POA: Diagnosis not present

## 2018-07-05 DIAGNOSIS — M5136 Other intervertebral disc degeneration, lumbar region: Secondary | ICD-10-CM | POA: Diagnosis not present

## 2018-07-05 DIAGNOSIS — M9904 Segmental and somatic dysfunction of sacral region: Secondary | ICD-10-CM | POA: Diagnosis not present

## 2018-07-07 DIAGNOSIS — G4733 Obstructive sleep apnea (adult) (pediatric): Secondary | ICD-10-CM | POA: Diagnosis not present

## 2018-08-07 DIAGNOSIS — E119 Type 2 diabetes mellitus without complications: Secondary | ICD-10-CM | POA: Diagnosis not present

## 2018-08-07 DIAGNOSIS — I10 Essential (primary) hypertension: Secondary | ICD-10-CM | POA: Diagnosis not present

## 2018-08-07 DIAGNOSIS — E7800 Pure hypercholesterolemia, unspecified: Secondary | ICD-10-CM | POA: Diagnosis not present

## 2018-08-08 DIAGNOSIS — R319 Hematuria, unspecified: Secondary | ICD-10-CM | POA: Diagnosis not present

## 2018-08-10 DIAGNOSIS — E119 Type 2 diabetes mellitus without complications: Secondary | ICD-10-CM | POA: Diagnosis not present

## 2018-08-10 DIAGNOSIS — I10 Essential (primary) hypertension: Secondary | ICD-10-CM | POA: Diagnosis not present

## 2018-08-10 DIAGNOSIS — M109 Gout, unspecified: Secondary | ICD-10-CM | POA: Diagnosis not present

## 2018-10-18 DIAGNOSIS — M5136 Other intervertebral disc degeneration, lumbar region: Secondary | ICD-10-CM | POA: Diagnosis not present

## 2018-10-18 DIAGNOSIS — M9904 Segmental and somatic dysfunction of sacral region: Secondary | ICD-10-CM | POA: Diagnosis not present

## 2018-10-18 DIAGNOSIS — M9903 Segmental and somatic dysfunction of lumbar region: Secondary | ICD-10-CM | POA: Diagnosis not present

## 2018-10-18 DIAGNOSIS — M9905 Segmental and somatic dysfunction of pelvic region: Secondary | ICD-10-CM | POA: Diagnosis not present

## 2018-10-19 ENCOUNTER — Encounter (HOSPITAL_COMMUNITY): Payer: Self-pay | Admitting: Emergency Medicine

## 2018-10-19 ENCOUNTER — Inpatient Hospital Stay (HOSPITAL_COMMUNITY)
Admission: EM | Admit: 2018-10-19 | Discharge: 2018-10-25 | DRG: 389 | Disposition: A | Discharge status: Home / Self Care | Payer: Medicare Other | Attending: Internal Medicine | Admitting: Internal Medicine

## 2018-10-19 ENCOUNTER — Other Ambulatory Visit: Payer: Self-pay

## 2018-10-19 ENCOUNTER — Emergency Department (HOSPITAL_COMMUNITY): Payer: Medicare Other

## 2018-10-19 DIAGNOSIS — Z8249 Family history of ischemic heart disease and other diseases of the circulatory system: Secondary | ICD-10-CM | POA: Diagnosis not present

## 2018-10-19 DIAGNOSIS — Z8501 Personal history of malignant neoplasm of esophagus: Secondary | ICD-10-CM | POA: Diagnosis not present

## 2018-10-19 DIAGNOSIS — K219 Gastro-esophageal reflux disease without esophagitis: Secondary | ICD-10-CM | POA: Diagnosis present

## 2018-10-19 DIAGNOSIS — E876 Hypokalemia: Secondary | ICD-10-CM | POA: Diagnosis not present

## 2018-10-19 DIAGNOSIS — M109 Gout, unspecified: Secondary | ICD-10-CM | POA: Diagnosis not present

## 2018-10-19 DIAGNOSIS — K56600 Partial intestinal obstruction, unspecified as to cause: Secondary | ICD-10-CM | POA: Diagnosis not present

## 2018-10-19 DIAGNOSIS — Z833 Family history of diabetes mellitus: Secondary | ICD-10-CM | POA: Diagnosis not present

## 2018-10-19 DIAGNOSIS — Z1159 Encounter for screening for other viral diseases: Secondary | ICD-10-CM

## 2018-10-19 DIAGNOSIS — Z03818 Encounter for observation for suspected exposure to other biological agents ruled out: Secondary | ICD-10-CM | POA: Diagnosis not present

## 2018-10-19 DIAGNOSIS — N400 Enlarged prostate without lower urinary tract symptoms: Secondary | ICD-10-CM | POA: Diagnosis present

## 2018-10-19 DIAGNOSIS — I10 Essential (primary) hypertension: Secondary | ICD-10-CM | POA: Diagnosis present

## 2018-10-19 DIAGNOSIS — Z720 Tobacco use: Secondary | ICD-10-CM | POA: Diagnosis present

## 2018-10-19 DIAGNOSIS — Z4659 Encounter for fitting and adjustment of other gastrointestinal appliance and device: Secondary | ICD-10-CM

## 2018-10-19 DIAGNOSIS — F17290 Nicotine dependence, other tobacco product, uncomplicated: Secondary | ICD-10-CM | POA: Diagnosis present

## 2018-10-19 DIAGNOSIS — E119 Type 2 diabetes mellitus without complications: Secondary | ICD-10-CM | POA: Diagnosis present

## 2018-10-19 DIAGNOSIS — N179 Acute kidney failure, unspecified: Secondary | ICD-10-CM | POA: Diagnosis not present

## 2018-10-19 DIAGNOSIS — I878 Other specified disorders of veins: Secondary | ICD-10-CM | POA: Diagnosis not present

## 2018-10-19 DIAGNOSIS — K5730 Diverticulosis of large intestine without perforation or abscess without bleeding: Secondary | ICD-10-CM | POA: Diagnosis not present

## 2018-10-19 DIAGNOSIS — E785 Hyperlipidemia, unspecified: Secondary | ICD-10-CM | POA: Diagnosis not present

## 2018-10-19 DIAGNOSIS — G4733 Obstructive sleep apnea (adult) (pediatric): Secondary | ICD-10-CM | POA: Diagnosis present

## 2018-10-19 DIAGNOSIS — Z79899 Other long term (current) drug therapy: Secondary | ICD-10-CM

## 2018-10-19 DIAGNOSIS — R1013 Epigastric pain: Secondary | ICD-10-CM | POA: Diagnosis not present

## 2018-10-19 DIAGNOSIS — E860 Dehydration: Secondary | ICD-10-CM | POA: Diagnosis not present

## 2018-10-19 DIAGNOSIS — R140 Abdominal distension (gaseous): Secondary | ICD-10-CM | POA: Diagnosis not present

## 2018-10-19 DIAGNOSIS — D72829 Elevated white blood cell count, unspecified: Secondary | ICD-10-CM | POA: Diagnosis present

## 2018-10-19 DIAGNOSIS — M47816 Spondylosis without myelopathy or radiculopathy, lumbar region: Secondary | ICD-10-CM | POA: Diagnosis not present

## 2018-10-19 DIAGNOSIS — R61 Generalized hyperhidrosis: Secondary | ICD-10-CM | POA: Diagnosis not present

## 2018-10-19 DIAGNOSIS — K56609 Unspecified intestinal obstruction, unspecified as to partial versus complete obstruction: Principal | ICD-10-CM | POA: Diagnosis present

## 2018-10-19 DIAGNOSIS — K8020 Calculus of gallbladder without cholecystitis without obstruction: Secondary | ICD-10-CM | POA: Diagnosis not present

## 2018-10-19 DIAGNOSIS — K5641 Fecal impaction: Secondary | ICD-10-CM | POA: Diagnosis not present

## 2018-10-19 DIAGNOSIS — K6389 Other specified diseases of intestine: Secondary | ICD-10-CM | POA: Diagnosis not present

## 2018-10-19 LAB — CBC WITH DIFFERENTIAL/PLATELET
Abs Immature Granulocytes: 0.07 10*3/uL (ref 0.00–0.07)
Basophils Absolute: 0.1 10*3/uL (ref 0.0–0.1)
Basophils Relative: 0 %
Eosinophils Absolute: 0.1 10*3/uL (ref 0.0–0.5)
Eosinophils Relative: 0 %
HCT: 42.5 % (ref 39.0–52.0)
Hemoglobin: 14.1 g/dL (ref 13.0–17.0)
Immature Granulocytes: 0 %
Lymphocytes Relative: 11 %
Lymphs Abs: 1.7 10*3/uL (ref 0.7–4.0)
MCH: 31.9 pg (ref 26.0–34.0)
MCHC: 33.2 g/dL (ref 30.0–36.0)
MCV: 96.2 fL (ref 80.0–100.0)
Monocytes Absolute: 0.8 10*3/uL (ref 0.1–1.0)
Monocytes Relative: 5 %
Neutro Abs: 13.1 10*3/uL — ABNORMAL HIGH (ref 1.7–7.7)
Neutrophils Relative %: 84 %
Platelets: 292 10*3/uL (ref 150–400)
RBC: 4.42 MIL/uL (ref 4.22–5.81)
RDW: 12.1 % (ref 11.5–15.5)
WBC: 15.8 10*3/uL — ABNORMAL HIGH (ref 4.0–10.5)
nRBC: 0 % (ref 0.0–0.2)

## 2018-10-19 LAB — CBC
HCT: 44.3 % (ref 39.0–52.0)
Hemoglobin: 14.6 g/dL (ref 13.0–17.0)
MCH: 31.7 pg (ref 26.0–34.0)
MCHC: 33 g/dL (ref 30.0–36.0)
MCV: 96.3 fL (ref 80.0–100.0)
Platelets: 287 10*3/uL (ref 150–400)
RBC: 4.6 MIL/uL (ref 4.22–5.81)
RDW: 12.1 % (ref 11.5–15.5)
WBC: 12.5 10*3/uL — ABNORMAL HIGH (ref 4.0–10.5)
nRBC: 0 % (ref 0.0–0.2)

## 2018-10-19 LAB — URINALYSIS, ROUTINE W REFLEX MICROSCOPIC
Bacteria, UA: NONE SEEN
Bilirubin Urine: NEGATIVE
Glucose, UA: NEGATIVE mg/dL
Ketones, ur: NEGATIVE mg/dL
Leukocytes,Ua: NEGATIVE
Nitrite: NEGATIVE
Protein, ur: NEGATIVE mg/dL
Specific Gravity, Urine: >1.046 — ABNORMAL HIGH (ref 1.005–1.030)
pH: 5 (ref 5.0–8.0)

## 2018-10-19 LAB — COMPREHENSIVE METABOLIC PANEL
ALT: 13 U/L (ref 0–44)
AST: 21 U/L (ref 15–41)
Albumin: 4.6 g/dL (ref 3.5–5.0)
Alkaline Phosphatase: 74 U/L (ref 38–126)
Anion gap: 11 (ref 5–15)
BUN: 17 mg/dL (ref 8–23)
CO2: 24 mmol/L (ref 22–32)
Calcium: 9.6 mg/dL (ref 8.9–10.3)
Chloride: 104 mmol/L (ref 98–111)
Creatinine, Ser: 1.38 mg/dL — ABNORMAL HIGH (ref 0.61–1.24)
GFR calc Af Amer: 54 mL/min — ABNORMAL LOW (ref >60)
GFR calc non Af Amer: 47 mL/min — ABNORMAL LOW (ref >60)
Glucose, Bld: 161 mg/dL — ABNORMAL HIGH (ref 70–99)
Potassium: 4.6 mmol/L (ref 3.5–5.1)
Sodium: 139 mmol/L (ref 135–145)
Total Bilirubin: 0.9 mg/dL (ref 0.3–1.2)
Total Protein: 7.7 g/dL (ref 6.5–8.1)

## 2018-10-19 LAB — BASIC METABOLIC PANEL
Anion gap: 11 (ref 5–15)
BUN: 18 mg/dL (ref 8–23)
CO2: 26 mmol/L (ref 22–32)
Calcium: 9.5 mg/dL (ref 8.9–10.3)
Chloride: 101 mmol/L (ref 98–111)
Creatinine, Ser: 1.34 mg/dL — ABNORMAL HIGH (ref 0.61–1.24)
GFR calc Af Amer: 56 mL/min — ABNORMAL LOW (ref >60)
GFR calc non Af Amer: 49 mL/min — ABNORMAL LOW (ref >60)
Glucose, Bld: 132 mg/dL — ABNORMAL HIGH (ref 70–99)
Potassium: 5 mmol/L (ref 3.5–5.1)
Sodium: 138 mmol/L (ref 135–145)

## 2018-10-19 LAB — PROTIME-INR
INR: 1.1 (ref 0.8–1.2)
Prothrombin Time: 13.8 seconds (ref 11.4–15.2)

## 2018-10-19 LAB — APTT: aPTT: 30 seconds (ref 24–36)

## 2018-10-19 LAB — TYPE AND SCREEN
ABO/RH(D): O POS
Antibody Screen: NEGATIVE

## 2018-10-19 LAB — ABO/RH: ABO/RH(D): O POS

## 2018-10-19 LAB — GLUCOSE, CAPILLARY: Glucose-Capillary: 118 mg/dL — ABNORMAL HIGH (ref 70–99)

## 2018-10-19 LAB — LIPASE, BLOOD: Lipase: 25 U/L (ref 11–51)

## 2018-10-19 IMAGING — DX DG ABD PORTABLE 1V
1 series · 1 of 1 positions shown · non-contrast
Comparison: None.

CLINICAL DATA: Evaluate small bowel obstruction.

EXAM:
PORTABLE ABDOMEN - 1 VIEW

[abdomen kub]
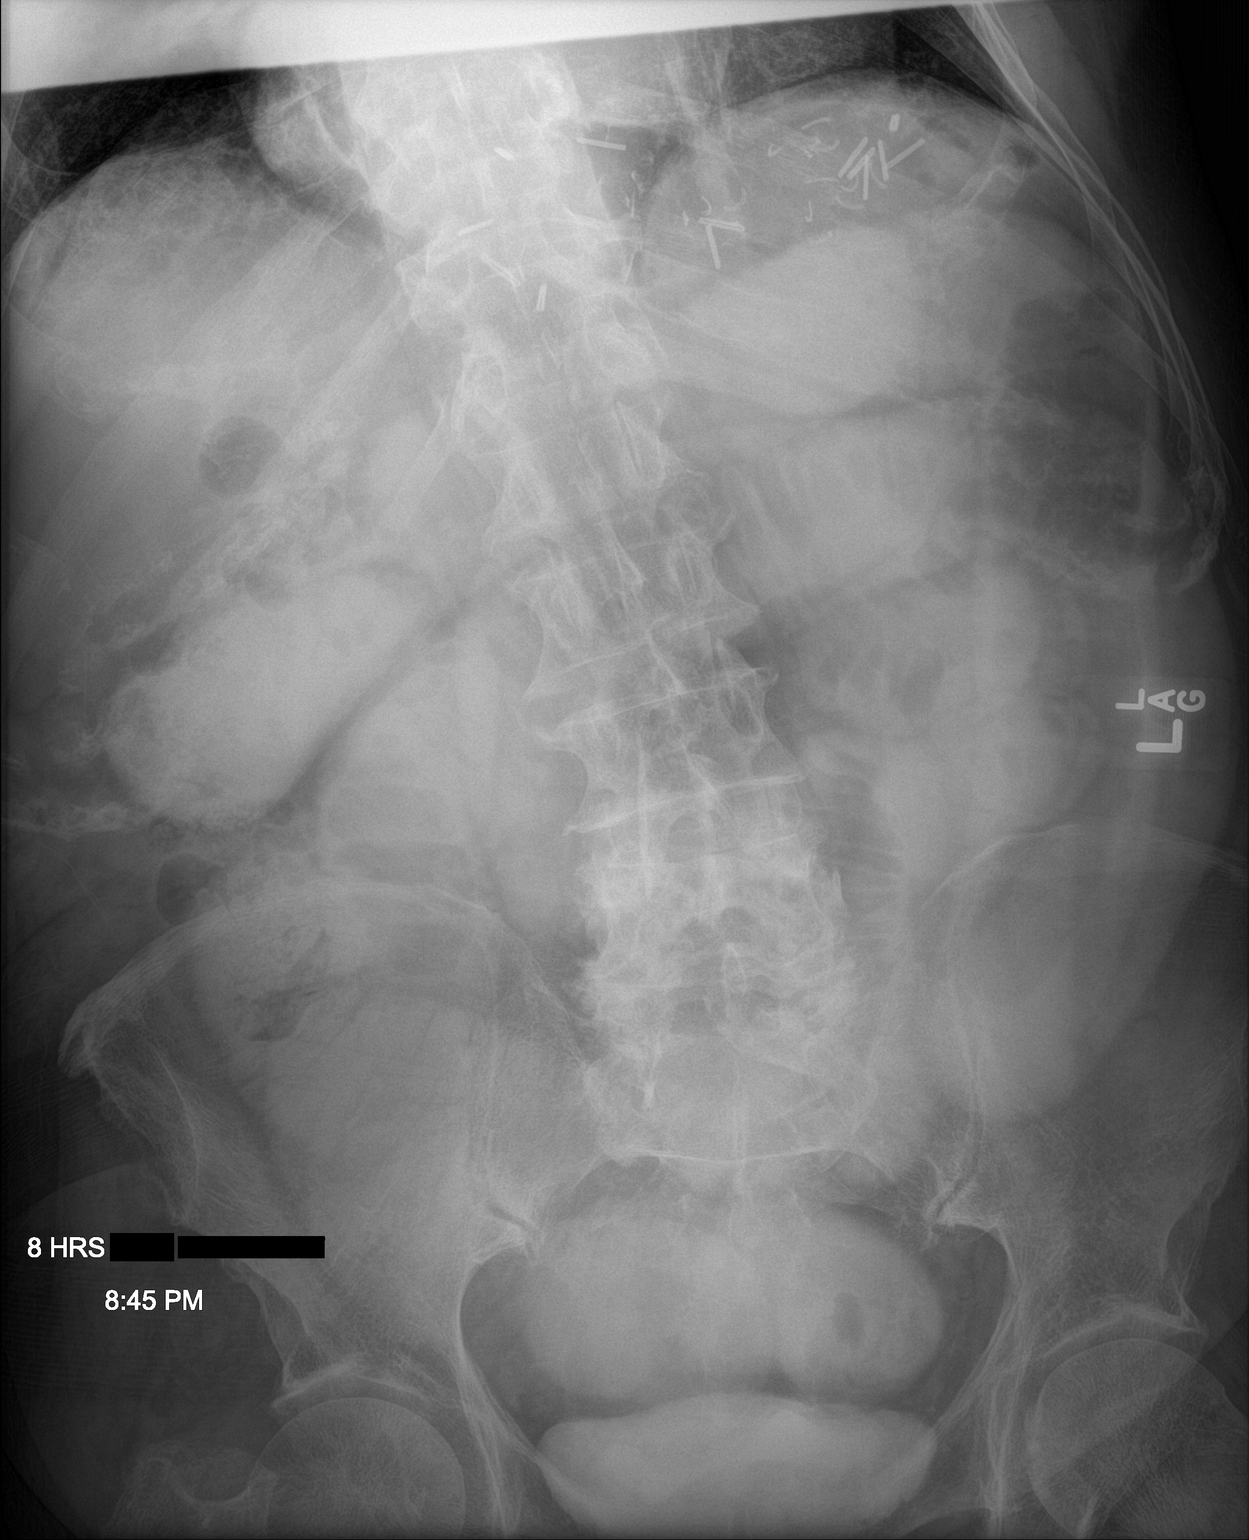

[1 of 1 positions shown; findings below may reference images not displayed]

FINDINGS: Films are taken approximately 8 hours after patient ingested oral
contrast.

Contrast is noted within multiple dilated small bowel loops.

No definite oral contrast noted within the colon.

Bladder contrast from recent CT identified.
IMPRESSION: Oral contrast noted within multiple dilated small bowel loops. No
definite oral contrast noted within the colon. This is compatible
with a high-grade small bowel obstruction given CT findings.

## 2018-10-19 MED ORDER — SODIUM CHLORIDE 0.9 % IV SOLN
INTRAVENOUS | Status: DC
  Administered 2018-10-19 – 2018-10-23 (×9): via INTRAVENOUS

## 2018-10-19 MED ORDER — MORPHINE SULFATE (PF) 4 MG/ML IV SOLN
4.0000 mg | Freq: Once | INTRAVENOUS | Status: AC
  Administered 2018-10-19: 04:00:00 4 mg via INTRAVENOUS
  Filled 2018-10-19: qty 1

## 2018-10-19 MED ORDER — SODIUM CHLORIDE 0.9 % IV BOLUS
500.0000 mL | Freq: Once | INTRAVENOUS | Status: AC
  Administered 2018-10-19: 04:00:00 500 mL via INTRAVENOUS

## 2018-10-19 MED ORDER — MORPHINE SULFATE (PF) 2 MG/ML IV SOLN
1.0000 mg | INTRAVENOUS | Status: DC | PRN
  Administered 2018-10-19 – 2018-10-20 (×7): 1 mg via INTRAVENOUS
  Filled 2018-10-19 (×7): qty 1

## 2018-10-19 MED ORDER — FAMOTIDINE IN NACL 20-0.9 MG/50ML-% IV SOLN
20.0000 mg | Freq: Two times a day (BID) | INTRAVENOUS | Status: DC
  Administered 2018-10-19 – 2018-10-25 (×13): 20 mg via INTRAVENOUS
  Filled 2018-10-19 (×14): qty 50

## 2018-10-19 MED ORDER — ONDANSETRON HCL 4 MG/2ML IJ SOLN
4.0000 mg | Freq: Three times a day (TID) | INTRAMUSCULAR | Status: DC | PRN
  Administered 2018-10-19: 11:00:00 4 mg via INTRAVENOUS
  Filled 2018-10-19: qty 2

## 2018-10-19 MED ORDER — FINASTERIDE 5 MG PO TABS
5.0000 mg | ORAL_TABLET | Freq: Every day | ORAL | Status: DC
  Administered 2018-10-19 – 2018-10-25 (×7): 5 mg via ORAL
  Filled 2018-10-19 (×7): qty 1

## 2018-10-19 MED ORDER — ATENOLOL 25 MG PO TABS
25.0000 mg | ORAL_TABLET | Freq: Every day | ORAL | Status: DC
  Administered 2018-10-19: 25 mg via ORAL
  Filled 2018-10-19: qty 1

## 2018-10-19 MED ORDER — ACETAMINOPHEN 325 MG PO TABS: 650.0000 mg | ORAL_TABLET | Freq: Four times a day (QID) | ORAL | Status: DC | PRN

## 2018-10-19 MED ORDER — IOHEXOL 300 MG/ML  SOLN
100.0000 mL | Freq: Once | INTRAMUSCULAR | Status: AC | PRN
  Administered 2018-10-19: 100 mL via INTRAVENOUS

## 2018-10-19 MED ORDER — METOPROLOL TARTRATE 5 MG/5ML IV SOLN
5.0000 mg | Freq: Four times a day (QID) | INTRAVENOUS | Status: DC
  Administered 2018-10-19 – 2018-10-24 (×19): 5 mg via INTRAVENOUS
  Filled 2018-10-19 (×18): qty 5

## 2018-10-19 MED ORDER — MORPHINE SULFATE (PF) 4 MG/ML IV SOLN
4.0000 mg | Freq: Once | INTRAVENOUS | Status: AC
  Administered 2018-10-19: 01:00:00 4 mg via INTRAVENOUS
  Filled 2018-10-19: qty 1

## 2018-10-19 MED ORDER — ACETAMINOPHEN 650 MG RE SUPP: 650.0000 mg | Freq: Four times a day (QID) | RECTAL | Status: DC | PRN

## 2018-10-19 MED ORDER — HYDRALAZINE HCL 20 MG/ML IJ SOLN
5.0000 mg | INTRAMUSCULAR | Status: DC | PRN
  Administered 2018-10-20 – 2018-10-23 (×2): 5 mg via INTRAVENOUS
  Filled 2018-10-19 (×2): qty 1

## 2018-10-19 MED ORDER — HYDROCODONE-ACETAMINOPHEN 5-325 MG PO TABS
1.0000 | ORAL_TABLET | Freq: Four times a day (QID) | ORAL | Status: DC | PRN
  Administered 2018-10-20 – 2018-10-24 (×5): 1 via ORAL
  Filled 2018-10-19 (×5): qty 1

## 2018-10-19 MED ORDER — ONDANSETRON HCL 4 MG/2ML IJ SOLN
4.0000 mg | INTRAMUSCULAR | Status: DC | PRN
  Administered 2018-10-19 – 2018-10-22 (×6): 4 mg via INTRAVENOUS
  Filled 2018-10-19 (×6): qty 2

## 2018-10-19 MED ORDER — AMLODIPINE BESYLATE 5 MG PO TABS
5.0000 mg | ORAL_TABLET | Freq: Every day | ORAL | Status: DC
  Administered 2018-10-19 (×2): 5 mg via ORAL
  Filled 2018-10-19 (×2): qty 1

## 2018-10-19 NOTE — ED Notes (Signed)
ED TO INPATIENT HANDOFF REPORT  ED Nurse Name and Phone #: Claiborne Billings [phone]  S Name/Age/Gender Oscar [name] 85 y.o. male Room/Bed: 016C/016C  Code Status   Code Status: Prior  Home/SNF/Other Home Patient oriented to: self, place, time and situation Is this baseline? Yes   Triage Complete: Triage complete  Chief Complaint epigastric pain  Triage Note Pt BIB [facility] from home, c/o epigastric pain that started suddenly 6 hours PTA. Hx SBO, LBM yesterday, denies nausea/vomiting. Per EMS, pt was diaphoretic on their arrival. Pt took omeprazole, mylanta, and hydrocodone 2 hours ago, no relief. Given 324mg  ASA by EMS.   Allergies Allergies  Allergen Reactions  . Bee Venom Anaphylaxis  . Tamsulosin Other (See Comments)    "Makes me feel weird"    Level of Care/Admitting Diagnosis ED Disposition    ED Disposition Condition Oscar [name] [[id number]]  Level of Care: Med-Surg [16]  Covid Evaluation: Screening Protocol (No Symptoms)  Diagnosis: SBO (small bowel obstruction) [facility]) [[id number]]  Admitting Physician: Ivor Costa [[id number]]  Attending Physician: Ivor Costa [phone]  Estimated length of stay: past midnight tomorrow  Certification:: I certify this patient will need inpatient services for at least 2 midnights  PT Class (Do Not Modify): Inpatient [101]  PT Acc Code (Do Not Modify): Private [1]       B Medical/Surgery History Past Medical History:  Diagnosis Date  . Acid reflux   . Barrett's esophagus   . BPH (benign prostatic hyperplasia)   . Colon polyps   . DDD (degenerative disc disease)   . Diabetes mellitus    controlled with diet and exercise  . ED (erectile dysfunction)   . Esophageal cancer ([place])    tx'd surgery - abdominal & right posterior chest approach (2001) - [place], [place]  . First degree AV block   . Gout   . Hemorrhoids   . History of palpitations   . Hyperlipidemia   . Hypertension   . Lipoma   .  Obesity   . Osteoarthritis   . Tinea versicolor   . Tinnitus    Past Surgical History:  Procedure Laterality Date  . bilateral olecranon bursal excisions    . Esophageal Cancer Surgery  2000, 2001  . GASTRIC RESECTION    . HEMORRHOID SURGERY    . NM MYOCAR PERF WALL MOTION  2011   dipyridamole myoview - stress images show medium in size, moderate in intensity perfusion defect in basal inferior & mid inferior walls with mild defect reversibility at rest, EF 64%  . TRANSTHORACIC ECHOCARDIOGRAM  2011   EF=>55%, mild mitral annular calcif, mild calcif of MV apparatus, mild TR, normal RSVP, mild AV regurg, aortic root sclerosis/calcifiication  . VASECTOMY       A IV Location/Drains/Wounds Patient Lines/Drains/Airways Status   Active Line/Drains/Airways    Name:   Placement date:   Placement time:   Site:   Days:   Peripheral IV 10/19/18 Left Forearm   10/19/18    0110    Forearm   less than 1          Intake/Output Last 24 hours No intake or output data in the 24 hours ending 10/19/18 0422  Labs/Imaging Results for orders placed or performed during the hospital encounter of 10/19/18 (from the past 48 hour(s))  CBC with Differential     Status: Abnormal   Collection Time: 10/19/18  1:28 AM  Result Value Ref Range  WBC 15.8 (H) 4.0 - 10.5 K/uL   RBC 4.42 4.22 - 5.81 MIL/uL   Hemoglobin 14.1 13.0 - 17.0 g/dL   HCT 42.5 39.0 - 52.0 %   MCV 96.2 80.0 - 100.0 fL   MCH 31.9 26.0 - 34.0 pg   MCHC 33.2 30.0 - 36.0 g/dL   RDW 12.1 11.5 - 15.5 %   Platelets 292 150 - 400 K/uL   nRBC 0.0 0.0 - 0.2 %   Neutrophils Relative % 84 %   Neutro Abs 13.1 (H) 1.7 - 7.7 K/uL   Lymphocytes Relative 11 %   Lymphs Abs 1.7 0.7 - 4.0 K/uL   Monocytes Relative 5 %   Monocytes Absolute 0.8 0.1 - 1.0 K/uL   Eosinophils Relative 0 %   Eosinophils Absolute 0.1 0.0 - 0.5 K/uL   Basophils Relative 0 %   Basophils Absolute 0.1 0.0 - 0.1 K/uL   Immature Granulocytes 0 %   Abs Immature Granulocytes  0.07 0.00 - 0.07 K/uL    Comment: Performed at [facility] [address], [place], [place] [address]  Comprehensive metabolic panel     Status: Abnormal   Collection Time: 10/19/18  1:28 AM  Result Value Ref Range   Sodium 139 135 - 145 mmol/L   Potassium 4.6 3.5 - 5.1 mmol/L   Chloride 104 98 - 111 mmol/L   CO2 24 22 - 32 mmol/L   Glucose, Bld 161 (H) 70 - 99 mg/dL   BUN 17 8 - 23 mg/dL   Creatinine, Ser 1.38 (H) 0.61 - 1.24 mg/dL   Calcium 9.6 8.9 - 10.3 mg/dL   Total Protein 7.7 6.5 - 8.1 g/dL   Albumin 4.6 3.5 - 5.0 g/dL   AST 21 15 - 41 U/L   ALT 13 0 - 44 U/L   Alkaline Phosphatase 74 38 - 126 U/L   Total Bilirubin 0.9 0.3 - 1.2 mg/dL   GFR calc non Af Amer 47 (L) >60 mL/min   GFR calc Af Amer 54 (L) >60 mL/min   Anion gap 11 5 - 15    Comment: Performed at [facility] [address], [place], [place] [address]  Lipase, blood     Status: None   Collection Time: 10/19/18  1:28 AM  Result Value Ref Range   Lipase 25 11 - 51 U/L    Comment: Performed at [facility] [address] [place], [place] [address]   Ct Abdomen Pelvis W Contrast  Result Date: 10/19/2018 CLINICAL DATA:  Epigastric pain EXAM: CT ABDOMEN AND PELVIS WITH CONTRAST TECHNIQUE: Multidetector CT imaging of the abdomen and pelvis was performed using the standard protocol following bolus administration of intravenous contrast. CONTRAST:  171mL OMNIPAQUE IOHEXOL 300 MG/ML  SOLN COMPARISON:  CT dated 11/06/2017 FINDINGS: Lower chest: There is stable scarring and atelectasis at the lung bases. Hepatobiliary: The liver is unremarkable. The gallbladder is distended with multiple gallstones. No CT evidence of acute cholecystitis. The common bile duct is mildly dilated but appears to taper towards the head of the pancreas. Pancreas: Unremarkable. No pancreatic ductal dilatation or surrounding inflammatory changes. Spleen: Normal in size without focal abnormality. Adrenals/Urinary Tract: Adrenal  glands are unremarkable. Kidneys are normal, without renal calculi, focal lesion, or hydronephrosis. Bladder is unremarkable. Stomach/Bowel: There is stable postsurgical changes of the stomach and esophagus. The appendix is normal. There is a moderate to high-grade small bowel obstruction secondary to swirling of the mesentery in the  mid abdomen. There is no pneumatosis or free air. There is a small amount of free fluid about the dilated loops of small bowel. The loops measure up to approximately 4.5 cm in diameter. The colon is unremarkable. Vascular/Lymphatic: Aortic atherosclerosis. No enlarged abdominal or pelvic lymph nodes. Reproductive: Prostate gland is significantly enlarged. Other: There is a small amount of free fluid in the pelvis, likely reactive. Musculoskeletal: No acute or significant osseous findings. IMPRESSION: 1. Moderate to high-grade small bowel obstruction. There is a transition point in the mid abdomen where there is significant swirling of the mesenteric vessels and small bowel. 2. Small amount of free fluid in the abdomen and pelvis, likely reactive to the moderate to high-grade small bowel obstruction. There is no free air. 3. Normal appendix in the right lower quadrant. 4. Stable postsurgical changes of the stomach and esophagus. 5. Cholelithiasis. The gallbladder is dilated, however there is no CT evidence of acute cholecystitis. Aortic Atherosclerosis (ICD10-I70.0). Electronically Signed   By: Constance Holster M.D.   On: 10/19/2018 03:28    Pending Labs Unresulted Labs (From admission, onward)    Start     Ordered   10/19/18 0337  Novel Coronavirus,NAA,(SEND-OUT TO REF LAB - TAT 24-48 hrs); Hosp Order  (Asymptomatic Patients Labs)  Once,   STAT    Question:  Rule Out  Answer:  Yes   10/19/18 0336   10/19/18 0119  Urinalysis, Routine w reflex microscopic  (ED Abdominal Pain)  ONCE - STAT,   STAT     10/19/18 0119   Signed and Held  Basic metabolic panel  Tomorrow morning,    R     Signed and Held   Signed and Held  CBC  Tomorrow morning,   R     Signed and Held   Signed and Held  Protime-INR  Once,   R     Signed and Held   Signed and Held  APTT  Once,   R     Signed and Held   Signed and Held  Type and screen [place]  Once,   R    Comments: [place]    Signed and Held          Vitals/Pain Today's Vitals   10/19/18 0245 10/19/18 0328 10/19/18 0329 10/19/18 0412  BP: (!) 175/92  (!) 182/76 (!) 148/75  Pulse: (!) 58  61 68  Resp: 15  15 16   Temp:      TempSrc:      SpO2: 97%  98% 100%  PainSc:  7   5     Isolation Precautions No active isolations  Medications Medications  hydrALAZINE (APRESOLINE) injection 5 mg (has no administration in time range)  morphine 2 MG/ML injection 1 mg (has no administration in time range)  morphine 4 MG/ML injection 4 mg (4 mg Intravenous Given 10/19/18 0128)  iohexol (OMNIPAQUE) 300 MG/ML solution 100 mL (100 mLs Intravenous Contrast Given 10/19/18 0254)  morphine 4 MG/ML injection 4 mg (4 mg Intravenous Given 10/19/18 0333)  sodium chloride 0.9 % bolus 500 mL (500 mLs Intravenous New Bag/Given 10/19/18 0412)    Mobility walks with device Low fall risk   Focused Assessments SBO-pt refusing NG, surgery made aware   R Recommendations: See Admitting Provider Note  Report given to:   Additional Notes: pt wife has been updated on status

## 2018-10-19 NOTE — Progress Notes (Signed)
Spoke with pt about CPAP for tonight. At this time he states she would like to hold off as he had trouble last time with our machine. I told him if he changes his mind to call RT.

## 2018-10-19 NOTE — ED Notes (Signed)
Patient aware that we need urine sample for testing, unable at this time. Pt given instruction on providing urine sample when able to do so, attempting at this time

## 2018-10-19 NOTE — Progress Notes (Signed)
[facility]  I have reviewed the H and P, the chart and examined the patient.   SBO- dehydration He is not nauseated and has not vomited this AM. No abdominal pain and abdominal distension has resolved. Not passing gas.  - declining NG- appreciate general surgery eval - cont IVF and pain medications IV  AKI - Cr 1.38 - basline ~ 0.88- cont IVF- hold HCTZ and Losartan- follow  Leukocytosis - due to above? - follow  HTN - change Atenolol and Losartan/HCTZ to Lopressor IV  BPH - cont Proscar   Debbe Odea, MD

## 2018-10-19 NOTE — ED Provider Notes (Signed)
[facility] EMERGENCY DEPARTMENT Provider Note   CSN: [id number] Arrival date & time: 10/19/18  0102  History   Chief Complaint Chief Complaint  Patient presents with  . Abdominal Pain    HPI Oscar [name] is a 85 y.o. male who presents with abdominal pain.  Past medical history significant for hx of recurrent SBO, hypertension, hyperlipidemia, diabetes, history of esophageal cancer, GERD. Patient states that around 6 PM tonight he had an acute onset of periumbilical abdominal pain.  Pain is severe and constant.  Nothing makes it better or worse.  He is not passing any gas.  His last bowel movement was this morning and he feels constipated.  He tried pain medicine and a PPI at home without relief and therefore he called EMS since pain was unrelenting. Pain feels similar as to when he had a SBO. He denies fever, chills, chest pain, shortness of breath, cough, nausea, vomiting.  No diarrhea.  Past surgical history significant for esophagectomy, gastrectomy, hemorrhoidectomy. SBOs have resolved in the past without intervention.   HPI  Past Medical History:  Diagnosis Date  . Acid reflux   . Barrett's esophagus   . BPH (benign prostatic hyperplasia)   . Colon polyps   . DDD (degenerative disc disease)   . Diabetes mellitus    controlled with diet and exercise  . ED (erectile dysfunction)   . Esophageal cancer ([place])    tx'd surgery - abdominal & right posterior chest approach (2001) - [place], [place]  . First degree AV block   . Gout   . Hemorrhoids   . History of palpitations   . Hyperlipidemia   . Hypertension   . Lipoma   . Obesity   . Osteoarthritis   . Tinea versicolor   . Tinnitus     Patient Active Problem List   Diagnosis Date Noted  . Acute on chronic respiratory failure with hypoxia ([place]) 11/15/2017  . Malnutrition of moderate degree 02/15/2017  . Leukocytosis 02/12/2017  . OSA (obstructive sleep apnea) 02/12/2017  . Dehydration 02/12/2017  .  Partial small bowel obstruction ([place]) 01/29/2017  . SBO (small bowel obstruction) ([place]) 10/31/2016  . Aortic insufficiency 07/09/2013  . HTN (hypertension) 07/09/2013  . PERSONAL HISTORY MALIGNANT NEOPLASM ESOPHAGUS 07/17/2009  . GERD 07/13/2009  . BARRETT'S ESOPHAGUS 07/13/2009  . DYSPHAGIA 07/13/2009  . FLATULENCE-GAS-BLOATING 07/13/2009    Past Surgical History:  Procedure Laterality Date  . bilateral olecranon bursal excisions    . Esophageal Cancer Surgery  2000, 2001  . GASTRIC RESECTION    . HEMORRHOID SURGERY    . NM MYOCAR PERF WALL MOTION  2011   dipyridamole myoview - stress images show medium in size, moderate in intensity perfusion defect in basal inferior & mid inferior walls with mild defect reversibility at rest, EF 64%  . TRANSTHORACIC ECHOCARDIOGRAM  2011   EF=>55%, mild mitral annular calcif, mild calcif of MV apparatus, mild TR, normal RSVP, mild AV regurg, aortic root sclerosis/calcifiication  . VASECTOMY          Home Medications    Prior to Admission medications   Medication Sig Start Date End Date Taking? Authorizing Provider  alprostadil (MUSE) 1000 MCG pellet 1,000 mcg by Transurethral route daily as needed for erectile dysfunction. use no more than 3 times per week    [provider]  atenolol (TENORMIN) 25 MG tablet Take 1 tablet (25 mg total) by mouth daily. 07/09/13   Hilty, Nadean Corwin, MD  bisacodyl (  DULCOLAX) 5 MG EC tablet Take 5 mg by mouth at bedtime as needed for moderate constipation.    [provider]  finasteride (PROSCAR) 5 MG tablet Take 1 tablet (5 mg total) by mouth daily. 11/24/17   Nita Sells, MD  HYDROcodone-acetaminophen (NORCO/VICODIN) 5-325 MG per tablet Take 1 tablet by mouth every 6 (six) hours as needed for moderate pain.    [provider]  losartan-hydrochlorothiazide (HYZAAR) 100-12.5 MG tablet Take 0.5 tablets by mouth daily.  02/02/17   [provider]  omeprazole (PRILOSEC) 20 MG  capsule Take 40 mg by mouth 2 (two) times daily before a meal.     [provider]  polyethylene glycol (MIRALAX / GLYCOLAX) packet Take 17 g by mouth at bedtime as needed for mild constipation.     [provider]    Family History Family History  Problem Relation Age of Onset  . Diabetes Mother   . CAD Mother   . Stroke Sister   . Colon cancer Neg Hx   . Esophageal cancer Neg Hx   . Rectal cancer Neg Hx   . Stomach cancer Neg Hx     Social History Social History   Tobacco Use  . Smoking status: Current Some Day Smoker    Years: 50.00    Types: Cigars, Pipe  . Smokeless tobacco: Never Used  . Tobacco comment: 2-3x/daily, sometimes not at all , tobacco info given 08/01/13  Substance Use Topics  . Alcohol use: Yes    Alcohol/week: 7.0 - 14.0 standard drinks    Types: 7 - 14 Standard drinks or equivalent per week  . Drug use: No     Allergies   Bee venom and Tamsulosin   Review of Systems Review of Systems  Constitutional: Negative for fever.  Respiratory: Negative for shortness of breath.   Cardiovascular: Negative for chest pain.  Gastrointestinal: Positive for abdominal distention, abdominal pain and constipation. Negative for nausea and vomiting.  Genitourinary: Negative for dysuria.  All other systems reviewed and are negative.    Physical Exam Updated Vital Signs BP (!) 172/79 (BP Location: Right Arm)   Pulse 63   Temp 97.6 F (36.4 C) (Oral)   Resp (!) 26   SpO2 100%   Physical Exam Vitals signs and nursing note reviewed.  Constitutional:      General: He is in acute distress (moderate distress due to pain).     Appearance: He is well-developed. He is not ill-appearing.  HENT:     Head: Normocephalic and atraumatic.  Eyes:     General: No scleral icterus.       Right eye: No discharge.        Left eye: No discharge.     Conjunctiva/sclera: Conjunctivae normal.     Pupils: Pupils are equal, round, and reactive to light.  Neck:      Musculoskeletal: Normal range of motion and neck supple.  Cardiovascular:     Rate and Rhythm: Normal rate and regular rhythm.  Pulmonary:     Effort: Pulmonary effort is normal. No respiratory distress.     Breath sounds: Normal breath sounds.  Abdominal:     General: There is no distension.     Palpations: Abdomen is soft.     Tenderness: There is abdominal tenderness (generalized).     Comments: Large midline abdominal scar  Skin:    General: Skin is warm and dry.  Neurological:     Mental Status: He is alert and  oriented to person, place, and time.  Psychiatric:        Behavior: Behavior normal.      ED Treatments / Results  Labs (all labs ordered are listed, but only abnormal results are displayed) Labs Reviewed  CBC WITH DIFFERENTIAL/PLATELET - Abnormal; Notable for the following components:      Result Value   WBC 15.8 (*)    Neutro Abs 13.1 (*)    All other components within normal limits  COMPREHENSIVE METABOLIC PANEL - Abnormal; Notable for the following components:   Glucose, Bld 161 (*)    Creatinine, Ser 1.38 (*)    GFR calc non Af Amer 47 (*)    GFR calc Af Amer 54 (*)    All other components within normal limits  NOVEL CORONAVIRUS, NAA (HOSPITAL ORDER, SEND-OUT TO REF LAB)  LIPASE, BLOOD  URINALYSIS, ROUTINE W REFLEX MICROSCOPIC    EKG EKG Interpretation  Date/Time:  Friday October 19 2018 01:27:46 EDT Ventricular Rate:  58 PR Interval:    QRS Duration: 107 QT Interval:  427 QTC Calculation: 420 R Axis:   10 Text Interpretation:  Sinus rhythm Anteroseptal infarct, old When compared with ECG of 11/15/2017, HEART RATE has decreased Confirmed by Delora Fuel ([id number]) on 10/19/2018 1:29:55 AM   Radiology Ct Abdomen Pelvis W Contrast  Result Date: 10/19/2018 CLINICAL DATA:  Epigastric pain EXAM: CT ABDOMEN AND PELVIS WITH CONTRAST TECHNIQUE: Multidetector CT imaging of the abdomen and pelvis was performed using the standard protocol following bolus  administration of intravenous contrast. CONTRAST:  166mL OMNIPAQUE IOHEXOL 300 MG/ML  SOLN COMPARISON:  CT dated 11/06/2017 FINDINGS: Lower chest: There is stable scarring and atelectasis at the lung bases. Hepatobiliary: The liver is unremarkable. The gallbladder is distended with multiple gallstones. No CT evidence of acute cholecystitis. The common bile duct is mildly dilated but appears to taper towards the head of the pancreas. Pancreas: Unremarkable. No pancreatic ductal dilatation or surrounding inflammatory changes. Spleen: Normal in size without focal abnormality. Adrenals/Urinary Tract: Adrenal glands are unremarkable. Kidneys are normal, without renal calculi, focal lesion, or hydronephrosis. Bladder is unremarkable. Stomach/Bowel: There is stable postsurgical changes of the stomach and esophagus. The appendix is normal. There is a moderate to high-grade small bowel obstruction secondary to swirling of the mesentery in the mid abdomen. There is no pneumatosis or free air. There is a small amount of free fluid about the dilated loops of small bowel. The loops measure up to approximately 4.5 cm in diameter. The colon is unremarkable. Vascular/Lymphatic: Aortic atherosclerosis. No enlarged abdominal or pelvic lymph nodes. Reproductive: Prostate gland is significantly enlarged. Other: There is a small amount of free fluid in the pelvis, likely reactive. Musculoskeletal: No acute or significant osseous findings. IMPRESSION: 1. Moderate to high-grade small bowel obstruction. There is a transition point in the mid abdomen where there is significant swirling of the mesenteric vessels and small bowel. 2. Small amount of free fluid in the abdomen and pelvis, likely reactive to the moderate to high-grade small bowel obstruction. There is no free air. 3. Normal appendix in the right lower quadrant. 4. Stable postsurgical changes of the stomach and esophagus. 5. Cholelithiasis. The gallbladder is dilated, however  there is no CT evidence of acute cholecystitis. Aortic Atherosclerosis (ICD10-I70.0). Electronically Signed   By: Constance Holster M.D.   On: 10/19/2018 03:28    Procedures Procedures (including critical care time)  Medications Ordered in ED Medications  morphine 4 MG/ML injection 4 mg (4 mg Intravenous Given  10/19/18 0128)  iohexol (OMNIPAQUE) 300 MG/ML solution 100 mL (100 mLs Intravenous Contrast Given 10/19/18 0254)  morphine 4 MG/ML injection 4 mg (4 mg Intravenous Given 10/19/18 0333)     Initial Impression / Assessment and Plan / ED Course  I have reviewed the triage vital signs and the nursing notes.  Pertinent labs & imaging results that were available during my care of the patient were reviewed by me and considered in my medical decision making (see chart for details).  85 year old male with history of recurrent small bowel obstruction presents with acute abdominal pain.  He is hypertensive but otherwise vital signs are normal.  Heart is regular rate and rhythm.  Lungs are clear to auscultation.  Abdomen is soft and generally tender.  He states pain is similar as to when he has had small bowel obstructions in the past.  Will obtain labs and CT abdomen and pelvis.  We will give morphine for pain.  CT shows moderate to high-grade small bowel obstruction with transition point.  CBC is remarkable for leukocytosis (15.8). CMP is remarkable for mild AKI (SCr 1.38). Shared visit with Dr. Roxanne Mins.  Will discuss with hospitalist. Will send COVID screen.  Discussed with Dr. Blaine Hamper with [facility].  He is requesting general surgery consult.  The patient is refusing NG tube.   Discussed with Dr. Redmond Pulling. They will see in the morning. He asked me to talk about NG tube with patient again which I did. The patient understands this will likely help him but he states that he does not want it and "will suffer the consequences" rather than try this again.  Final Clinical Impressions(s) / ED Diagnoses   Final  diagnoses:  SBO (small bowel obstruction) ([place])  AKI (acute kidney injury) [facility])    ED Discharge Orders    None       Recardo Evangelist, PA-C 83/29/19 [id number]    Delora Fuel, MD 60/04/59 [phone]

## 2018-10-19 NOTE — ED Notes (Signed)
Patient transported to CT 

## 2018-10-19 NOTE — Progress Notes (Signed)
Initial Nutrition Assessment  RD working remotely.  DOCUMENTATION CODES:   Not applicable  INTERVENTION:   -RD will follow for diet advancement and supplement as appropriate  NUTRITION DIAGNOSIS:   Inadequate oral intake related to altered GI function as evidenced by NPO status.  GOAL:   Patient will meet greater than or equal to 90% of their needs  MONITOR:   Diet advancement, Labs, Weight trends, Skin, I & O's  REASON FOR ASSESSMENT:   Malnutrition Screening Tool    ASSESSMENT:   Oscar [name] is a 85 y.o. male with medical history significant of hypertension, hyperlipidemia, diet-controlled diabetes, GERD, gout, BPH, esophageal cancer (surgery 2001), small bowel obstruction, tobacco abuse, obesity, OSA on CPAP, who presents with abdominal pain.  Pt admitted with SBO.  Reviewed I/O's: -400 ml x 24 hours  UOP: 300 ml x 24 hours  Emesis: 100 ml x 24 hours  Pt currently NPO. He has refused NGT replacement x 3 (from hospitalist, general surgery PA, and surgeon). Pt was counseled by these practitioners and he understands importance, but still refuses. Per surgeon notes, abdomen less distended after emesis today.   Reviewed wt hx; noted pt has experienced a 4.3% wt loss over the past year, which is not significant for time frame.  Labs reviewed: CBGS: 118.   NUTRITION - FOCUSED PHYSICAL EXAM:    Most Recent Value  Orbital Region  Unable to assess  Upper Arm Region  Unable to assess  Thoracic and Lumbar Region  Unable to assess  Buccal Region  Unable to assess  Temple Region  Unable to assess  Clavicle Bone Region  Unable to assess  Clavicle and Acromion Bone Region  Unable to assess  Scapular Bone Region  Unable to assess  Dorsal Hand  Unable to assess  Patellar Region  Unable to assess  Anterior Thigh Region  Unable to assess  Posterior Calf Region  Unable to assess  Edema (RD Assessment)  Unable to assess  Hair  Unable to assess  Eyes  Unable to assess   Mouth  Unable to assess  Skin  Unable to assess  Nails  Unable to assess       Diet Order:   Diet Order            Diet NPO time specified Except for: Sips with Meds  Diet effective now              EDUCATION NEEDS:   No education needs have been identified at this time  Skin:  Skin Assessment: Reviewed RN Assessment  Last BM:  10/18/18  Height:   Ht Readings from Last 1 Encounters:  10/19/18 6\' 1"  (1.854 m)    Weight:   Wt Readings from Last 1 Encounters:  10/19/18 83.7 kg    Ideal Body Weight:  83.6 kg  BMI:  Body mass index is 24.35 kg/m.  Estimated Nutritional Needs:   Kcal:  2100-2300  Protein:  110-125 grams  Fluid:  > 2.1 L    Lesleigh Hughson A. Jimmye Norman, RD, LDN, [place] Registered Dietitian II Certified Diabetes Care and Education Specialist Pager: [phone] After hours Pager: [phone]

## 2018-10-19 NOTE — H&P (Addendum)
History and Physical    Oscar [name] RDE:[id number] DOB: Sep 22, 1932 DOA: 10/19/2018  Referring MD/NP/PA:   PCP: Deland Pretty, MD   Patient coming from:  The patient is coming from home.  At baseline, pt is independent for most of ADL.        Chief Complaint: Abdominal pain  HPI: Oscar [name] is a 85 y.o. male with medical history significant of hypertension, hyperlipidemia, diet-controlled diabetes, GERD, gout, BPH, esophageal cancer (surgery 2001), small bowel obstruction, tobacco abuse, obesity, OSA on CPAP, who presents with abdominal pain.  Patient states that his abdominal pain started yesterday evening, which is located in the epigastric area, initially "more than 10 out of 10 in severity", currently 4 out of 10 in severity, dull aching pain, nonradiating.  Patient does not have nausea and vomiting.  No fever or chills.  Last bowel movement was yesterday morning.  Patient denies chest pain, shortness breath, cough.  No symptoms of UTI or unilateral weakness.  Patient is a smoker, but states that he does not need nicotine patch.  ED Course: pt was found to have WBC 15.8, pending COVID-19 test, AKI with creatinine 1.38, BUN 17, temperature normal, blood pressure 182/76, no tachycardia, oxygen saturation 97% on room air.  CT abdomen/pelvis that showed moderate to high-grade small bowel obstruction with transition point in the mid abdomen where there is significant swirling of the mesenteric vessels and small bowel. Pt is admitted to [facility] bed as inpatient.  General surgeon, Dr. Redmond Pulling is consulted by EDP.  Review of Systems:   General: no fevers, chills, no body weight gain, has poor appetite, has fatigue HEENT: no blurry vision, hearing changes or sore throat Respiratory: no dyspnea, coughing, wheezing CV: no chest pain, no palpitations GI: no nausea, vomiting, has abdominal pain, no diarrhea, constipation GU: no dysuria, burning on urination, increased urinary frequency,  hematuria  Ext: no leg edema Neuro: no unilateral weakness, numbness, or tingling, no vision change or hearing loss Skin: no rash, no skin tear. MSK: No muscle spasm, no deformity, no limitation of range of movement in spin Heme: No easy bruising.  Travel history: No recent long distant travel.  Allergy:  Allergies  Allergen Reactions  . Bee Venom Anaphylaxis  . Tamsulosin Other (See Comments)    "Makes me feel weird"    Past Medical History:  Diagnosis Date  . Acid reflux   . Barrett's esophagus   . BPH (benign prostatic hyperplasia)   . Colon polyps   . DDD (degenerative disc disease)   . Diabetes mellitus    controlled with diet and exercise  . ED (erectile dysfunction)   . Esophageal cancer ([place])    tx'd surgery - abdominal & right posterior chest approach (2001) - [place], [place]  . First degree AV block   . Gout   . Hemorrhoids   . History of palpitations   . Hyperlipidemia   . Hypertension   . Lipoma   . Obesity   . Osteoarthritis   . Tinea versicolor   . Tinnitus     Past Surgical History:  Procedure Laterality Date  . bilateral olecranon bursal excisions    . Esophageal Cancer Surgery  2000, 2001  . GASTRIC RESECTION    . HEMORRHOID SURGERY    . NM MYOCAR PERF WALL MOTION  2011   dipyridamole myoview - stress images show medium in size, moderate in intensity perfusion defect in basal inferior & mid inferior walls with mild defect reversibility at  rest, EF 64%  . TRANSTHORACIC ECHOCARDIOGRAM  2011   EF=>55%, mild mitral annular calcif, mild calcif of MV apparatus, mild TR, normal RSVP, mild AV regurg, aortic root sclerosis/calcifiication  . VASECTOMY      Social History:  reports that he has been smoking cigars and pipe. He has smoked for the past 50.00 years. He has never used smokeless tobacco. He reports current alcohol use of about 7.0 - 14.0 standard drinks of alcohol per week. He reports that he does not use drugs.  Family History:  Family History   Problem Relation Age of Onset  . Diabetes Mother   . CAD Mother   . Stroke Sister   . Colon cancer Neg Hx   . Esophageal cancer Neg Hx   . Rectal cancer Neg Hx   . Stomach cancer Neg Hx      Prior to Admission medications   Medication Sig Start Date End Date Taking? Authorizing Provider  alprostadil (MUSE) 1000 MCG pellet 1,000 mcg by Transurethral route daily as needed for erectile dysfunction. use no more than 3 times per week    [provider]  atenolol (TENORMIN) 25 MG tablet Take 1 tablet (25 mg total) by mouth daily. 07/09/13   Hilty, Nadean Corwin, MD  bisacodyl (DULCOLAX) 5 MG EC tablet Take 5 mg by mouth at bedtime as needed for moderate constipation.    [provider]  finasteride (PROSCAR) 5 MG tablet Take 1 tablet (5 mg total) by mouth daily. 11/24/17   Nita Sells, MD  HYDROcodone-acetaminophen (NORCO/VICODIN) 5-325 MG per tablet Take 1 tablet by mouth every 6 (six) hours as needed for moderate pain.    [provider]  losartan-hydrochlorothiazide (HYZAAR) 100-12.5 MG tablet Take 0.5 tablets by mouth daily.  02/02/17   [provider]  omeprazole (PRILOSEC) 20 MG capsule Take 40 mg by mouth 2 (two) times daily before a meal.     [provider]  polyethylene glycol (MIRALAX / GLYCOLAX) packet Take 17 g by mouth at bedtime as needed for mild constipation.     [provider]    Physical Exam: Vitals:   10/19/18 0215 10/19/18 0245 10/19/18 0329 10/19/18 0412  BP: (!) 163/85 (!) 175/92 (!) 182/76 (!) 148/75  Pulse: 63 (!) 58 61 68  Resp: 16 15 15 16   Temp:      TempSrc:      SpO2: 97% 97% 98% 100%   General: Not in acute distress HEENT:       Eyes: PERRL, EOMI, no scleral icterus.       ENT: No discharge from the ears and nose, no pharynx injection, no tonsillar enlargement.        Neck: No JVD, no bruit, no mass felt. Heme: No neck lymph node enlargement. Cardiac: S1/S2, RRR, No murmurs, No gallops or rubs.  Respiratory:  No rales, wheezing, rhonchi or rubs. GI: mildly distended, with diffused tenderness, has a large surgical scar, no organomegaly, BS present. GU: No hematuria Ext: No pitting leg edema bilaterally. 2+DP/PT pulse bilaterally. Musculoskeletal: No joint deformities, No joint redness or warmth, no limitation of ROM in spin. Skin: No rashes.  Neuro: Alert, oriented X3, cranial nerves II-XII grossly intact, moves all extremities normally. Psych: Patient is not psychotic, no suicidal or hemocidal ideation.  Labs on Admission: I have personally reviewed following labs and imaging studies  CBC: Recent Labs  Lab 10/19/18 0128  WBC 15.8*  NEUTROABS 13.1*  HGB 14.1  HCT 42.5  MCV  96.2  PLT 867   Basic Metabolic Panel: Recent Labs  Lab 10/19/18 0128  NA 139  K 4.6  CL 104  CO2 24  GLUCOSE 161*  BUN 17  CREATININE 1.38*  CALCIUM 9.6   GFR: CrCl cannot be calculated (Unknown ideal weight.). Liver Function Tests: Recent Labs  Lab 10/19/18 0128  AST 21  ALT 13  ALKPHOS 74  BILITOT 0.9  PROT 7.7  ALBUMIN 4.6   Recent Labs  Lab 10/19/18 0128  LIPASE 25   No results for input(s): AMMONIA in the last 168 hours. Coagulation Profile: No results for input(s): INR, PROTIME in the last 168 hours. Cardiac Enzymes: No results for input(s): CKTOTAL, CKMB, CKMBINDEX, TROPONINI in the last 168 hours. BNP (last 3 results) No results for input(s): PROBNP in the last 8760 hours. HbA1C: No results for input(s): HGBA1C in the last 72 hours. CBG: No results for input(s): GLUCAP in the last 168 hours. Lipid Profile: No results for input(s): CHOL, HDL, LDLCALC, TRIG, CHOLHDL, LDLDIRECT in the last 72 hours. Thyroid Function Tests: No results for input(s): TSH, T4TOTAL, FREET4, T3FREE, THYROIDAB in the last 72 hours. Anemia Panel: No results for input(s): VITAMINB12, FOLATE, FERRITIN, TIBC, IRON, RETICCTPCT in the last 72 hours. Urine analysis:    Component Value  Date/Time   COLORURINE AMBER (A) 11/15/2017 2140   APPEARANCEUR HAZY (A) 11/15/2017 2140   LABSPEC 1.011 11/15/2017 2140   PHURINE 5.0 11/15/2017 2140   GLUCOSEU NEGATIVE 11/15/2017 2140   HGBUR LARGE (A) 11/15/2017 2140   BILIRUBINUR NEGATIVE 11/15/2017 2140   KETONESUR NEGATIVE 11/15/2017 2140   PROTEINUR NEGATIVE 11/15/2017 2140   UROBILINOGEN 0.2 09/12/2014 1816   NITRITE NEGATIVE 11/15/2017 2140   LEUKOCYTESUR NEGATIVE 11/15/2017 2140   Sepsis Labs: @LABRCNTIP (procalcitonin:4,lacticidven:4) )No results found for this or any previous visit (from the past 240 hour(s)).   Radiological Exams on Admission: Ct Abdomen Pelvis W Contrast  Result Date: 10/19/2018 CLINICAL DATA:  Epigastric pain EXAM: CT ABDOMEN AND PELVIS WITH CONTRAST TECHNIQUE: Multidetector CT imaging of the abdomen and pelvis was performed using the standard protocol following bolus administration of intravenous contrast. CONTRAST:  152mL OMNIPAQUE IOHEXOL 300 MG/ML  SOLN COMPARISON:  CT dated 11/06/2017 FINDINGS: Lower chest: There is stable scarring and atelectasis at the lung bases. Hepatobiliary: The liver is unremarkable. The gallbladder is distended with multiple gallstones. No CT evidence of acute cholecystitis. The common bile duct is mildly dilated but appears to taper towards the head of the pancreas. Pancreas: Unremarkable. No pancreatic ductal dilatation or surrounding inflammatory changes. Spleen: Normal in size without focal abnormality. Adrenals/Urinary Tract: Adrenal glands are unremarkable. Kidneys are normal, without renal calculi, focal lesion, or hydronephrosis. Bladder is unremarkable. Stomach/Bowel: There is stable postsurgical changes of the stomach and esophagus. The appendix is normal. There is a moderate to high-grade small bowel obstruction secondary to swirling of the mesentery in the mid abdomen. There is no pneumatosis or free air. There is a small amount of free fluid about the dilated loops of  small bowel. The loops measure up to approximately 4.5 cm in diameter. The colon is unremarkable. Vascular/Lymphatic: Aortic atherosclerosis. No enlarged abdominal or pelvic lymph nodes. Reproductive: Prostate gland is significantly enlarged. Other: There is a small amount of free fluid in the pelvis, likely reactive. Musculoskeletal: No acute or significant osseous findings. IMPRESSION: 1. Moderate to high-grade small bowel obstruction. There is a transition point in the mid abdomen where there is significant swirling of the mesenteric vessels and small bowel.  2. Small amount of free fluid in the abdomen and pelvis, likely reactive to the moderate to high-grade small bowel obstruction. There is no free air. 3. Normal appendix in the right lower quadrant. 4. Stable postsurgical changes of the stomach and esophagus. 5. Cholelithiasis. The gallbladder is dilated, however there is no CT evidence of acute cholecystitis. Aortic Atherosclerosis (ICD10-I70.0). Electronically Signed   By: Constance Holster M.D.   On: 10/19/2018 03:28     EKG: Independently reviewed.  Sinus rhythm, QTC 420, bradycardia, anteroseptal infarction pattern.   Assessment/Plan Principal Problem:   SBO (small bowel obstruction) (HCC) Active Problems:   GERD   HTN (hypertension)   Leukocytosis   OSA (obstructive sleep apnea)   Tobacco abuse   BPH (benign prostatic hyperplasia)   AKI (acute kidney injury) ([place])   SBO (small bowel obstruction) ([place]): CT abdomen/pelvis that showed moderate to high-grade small bowel obstruction with transition point in the mid abdomen where there is significant swirling of the mesenteric vessels and small bowel. Pt refused TG tube. He does not have active nausea vomiting currently.  General surgeon, Dr. Redmond Pulling is consulted by EDP.   -Admit to med surg bed as inpt -NPO   -morphine prn pain -Prn Zofran prn nausea   -IVF: 500 cc NS, then 75 cc/h -INR/PTT/type & screen -Follow-up general surgeons  recommendation  Addendum: pt developed nausea and vomiting, mostly dry heaves in the morning.  I spent lengthy time with patient to have emphasized the importance of NG tube placement, but he still refuses NG tube.    GERD: -IV pepcid  HTN (hypertension): -hold Hyzaar due to AKI -start amlodipine 5 mg daily -Continue atenolol -IV hydralazine as needed  AKI: Likely due to dehydration and continuation of ARB, diuretics. - IVF as above - Follow up renal function by BMP - Hold Hyzarr  Leukocytosis: Patient has chronic leukocytosis, etiology is not clear, possibly due to reactive leukocytosis.  No signs of infection.  No fever. -Follow-up with CBC  OSA (obstructive sleep apnea): -CPAP  Tobacco abuse: -refused nicotine patch  BPH (benign prostatic hyperplasia): -continue Proscar   Inpatient status:  # Patient requires inpatient status due to high intensity of service, high risk for further deterioration and high frequency of surveillance required.  I certify that at the point of admission it is my clinical judgment that the patient will require inpatient hospital care spanning beyond 2 midnights from the point of admission.  . This patient has multiple chronic comorbidities including hypertension, hyperlipidemia, diet-controlled diabetes, GERD, gout, BPH, esophageal cancer (surgery 2001), small bowel obstruction, tobacco abuse, obesity, OSA on CPAP . Now patient has presenting with AP due to SBO . The worrisome physical exam findings include abdominal tenderness . The initial radiographic and laboratory data are worrisome because of leukocytosis, AKI.  CT scan showed moderate to high-grade small bowel obstruction. . Current medical needs: please see my assessment and plan . Predictability of an adverse outcome (risk): Patient has multiple complaints, now presents with moderate to high-grade small bowel obstruction.  Given his old age and multiple comorbidities, patient is at high  risk of deteriorating.  Patient may need surgery.  Will need to be treated in hospital for at least 2 days.     DVT ppx: SCD Code Status: Full code Family Communication: None at bed side. Disposition Plan:  Anticipate discharge back to previous home environment Consults called: General surgeon, Dr. Redmond Pulling is consulted by EDP. Admission status: medical floor/inpt  Date of Service 10/19/2018    Ivor Costa [facility]   If 7PM-7AM, please contact night-coverage [email or web address] Password [facility] 10/19/2018, 4:36 AM

## 2018-10-19 NOTE — Consult Note (Signed)
[facility] Surgery Consult Note  Oscar [name] 01-27-1933  [id number].    Requesting MD: Dr. Ivor Costa Chief Complaint/Reason for Consult: SBO  HPI: Oscar [name] is a 85 y.o. male with a prior history of open abdominal surgery in 2000 and 2001 for esophageal cancer s/p gastric resection with a history of recurrent SBO (6/18, 9/18, 10/18, & 7/19) who presented to [facility] for epigastric abdominal pain, N/V in the early hours of 6/12 and was admitted for SBO.   The patient reports that around 6:30 PM last night after eating dinner he started developing severe onset of epigastric abdominal pain that he describes as "a balloon filling in my belly".  The patient reports associated bloating and nausea. His symptoms were constant.  He tried a PPI at home along with an enema without any relief.  He denies any associated fever, chills, chest pain, shortness of breath, cough, diarrhea or urinary symptoms.  His last BM was yesterday morning and normal.  He has not passed flatus since onset of his symptoms.  In the ED patient was afebrile without tachycardia, tachypnea hypoxia or hypotension.  He was noted to be hypertensive.  His initial pain score was a 10/10.   Lab work was significant for a leukocytosis of 15,800.  Electrolytes within normal limits.  He was noted to have a AKI with a creatinine of 1.38 (baseline 0.88).  CT abdomen and pelvis was obtained that showed a moderate to high-grade small bowel obstruction with a transition point in the mid abdomen where there is significant swirling of the mesenteric vessels and small bowel.  The patient was admitted to medicine service.  He has refused NG tube since admission.  Both the PA in the ED as well as the admitting physician discussed at length with the patient per their notes reasons for this but he still refused.  Overnight he began having emesis.  Last episode around 8AM.  He reports his pain has significantly improved.  It is now a 2/10. PMH  significant for HTN, HLD, DM, GERD, OSA, tobacco abuse. He is not on any blood thinners.   ROS: Review of Systems  Constitutional: Negative for chills and fever.  Respiratory: Negative for cough and shortness of breath.   Cardiovascular: Negative for chest pain and leg swelling.  Gastrointestinal: Positive for abdominal pain, nausea and vomiting. Negative for blood in stool, constipation, diarrhea and melena.  Genitourinary: Negative for dysuria.  All other systems reviewed and are negative. All systems reviewed and otherwise negative except for as above  Family History  Problem Relation Age of Onset   Diabetes Mother    CAD Mother    Stroke Sister    Colon cancer Neg Hx    Esophageal cancer Neg Hx    Rectal cancer Neg Hx    Stomach cancer Neg Hx     Past Medical History:  Diagnosis Date   Acid reflux    Barrett's esophagus    BPH (benign prostatic hyperplasia)    Colon polyps    DDD (degenerative disc disease)    Diabetes mellitus    controlled with diet and exercise   ED (erectile dysfunction)    Esophageal cancer ([place])    tx'd surgery - abdominal & right posterior chest approach (2001) - [place], [place]   First degree AV block    Gout    Hemorrhoids    History of palpitations    Hyperlipidemia    Hypertension    Lipoma  Obesity    Osteoarthritis    Tinea versicolor    Tinnitus     Past Surgical History:  Procedure Laterality Date   bilateral olecranon bursal excisions     Esophageal Cancer Surgery  2000, 2001   GASTRIC RESECTION     HEMORRHOID SURGERY     NM MYOCAR PERF WALL MOTION  2011   dipyridamole myoview - stress images show medium in size, moderate in intensity perfusion defect in basal inferior & mid inferior walls with mild defect reversibility at rest, EF 64%   TRANSTHORACIC ECHOCARDIOGRAM  2011   EF=>55%, mild mitral annular calcif, mild calcif of MV apparatus, mild TR, normal RSVP, mild AV regurg, aortic root  sclerosis/calcifiication   VASECTOMY      Social History:  reports that he has been smoking cigars and pipe. He has smoked for the past 50.00 years. He has never used smokeless tobacco. He reports current alcohol use of about 7.0 - 14.0 standard drinks of alcohol per week. He reports that he does not use drugs.  Allergies:  Allergies  Allergen Reactions   Bee Venom Anaphylaxis   Tamsulosin Other (See Comments)    "Makes me feel weird"    Medications Prior to Admission  Medication Sig Dispense Refill   alprostadil (MUSE) 1000 MCG pellet 1,000 mcg by Transurethral route daily as needed for erectile dysfunction. use no more than 3 times per week     atenolol (TENORMIN) 25 MG tablet Take 1 tablet (25 mg total) by mouth daily. 30 tablet 11   bisacodyl (DULCOLAX) 5 MG EC tablet Take 5 mg by mouth at bedtime as needed for moderate constipation.     finasteride (PROSCAR) 5 MG tablet Take 1 tablet (5 mg total) by mouth daily. 30 tablet 1   HYDROcodone-acetaminophen (NORCO/VICODIN) 5-325 MG per tablet Take 1 tablet by mouth every 6 (six) hours as needed for moderate pain.     losartan-hydrochlorothiazide (HYZAAR) 100-12.5 MG tablet Take 0.5 tablets by mouth daily.   4   omeprazole (PRILOSEC) 20 MG capsule Take 40 mg by mouth 2 (two) times daily before a meal.      polyethylene glycol (MIRALAX / GLYCOLAX) packet Take 17 g by mouth at bedtime as needed for mild constipation.       Prior to Admission medications   Medication Sig Start Date End Date Taking? Authorizing Provider  alprostadil (MUSE) 1000 MCG pellet 1,000 mcg by Transurethral route daily as needed for erectile dysfunction. use no more than 3 times per week    [provider]  atenolol (TENORMIN) 25 MG tablet Take 1 tablet (25 mg total) by mouth daily. 07/09/13   Hilty, Nadean Corwin, MD  bisacodyl (DULCOLAX) 5 MG EC tablet Take 5 mg by mouth at bedtime as needed for moderate constipation.    [provider]   finasteride (PROSCAR) 5 MG tablet Take 1 tablet (5 mg total) by mouth daily. 11/24/17   Nita Sells, MD  HYDROcodone-acetaminophen (NORCO/VICODIN) 5-325 MG per tablet Take 1 tablet by mouth every 6 (six) hours as needed for moderate pain.    [provider]  losartan-hydrochlorothiazide (HYZAAR) 100-12.5 MG tablet Take 0.5 tablets by mouth daily.  02/02/17   [provider]  omeprazole (PRILOSEC) 20 MG capsule Take 40 mg by mouth 2 (two) times daily before a meal.     [provider]  polyethylene glycol (MIRALAX / GLYCOLAX) packet Take 17 g by mouth at bedtime as needed for mild constipation.  [provider]    Blood pressure (!) 163/85, pulse 68, temperature 97.9 F (36.6 C), temperature source Oral, resp. rate 16, height 6\' 1"  (1.854 m), weight 83.7 kg, SpO2 100 %. Physical Exam: General: pleasant, frail appearing elderly male who is sitting up in bed in NAD HEENT: head is normocephalic, atraumatic.  Sclera are noninjected.  Pupils equal and round.  Ears and nose without any masses or lesions.  Mouth is pink and moist. Dentition fair Heart: regular, rate, and rhythm.  No obvious murmurs, gallops, or rubs noted.  Palpable pedal pulses bilaterally Lungs: CTAB, no wheezes, rhonchi, or rales noted.  Respiratory effort nonlabored Abd: Soft, mild distension, mild tenderness of the epigastrium without rebound, rigidity or guarding. No peritonitis. Hypoactive bowel sounds. There is a large midline scar. No masses, hernias, or organomegaly MS: all 4 extremities are symmetrical with no cyanosis, clubbing, or edema. Skin: warm and dry with no masses, lesions, or rashes Psych: A&Ox3 with an appropriate affect. Neuro: cranial nerves grossly intact, extremity CSM intact bilaterally, normal speech     Results for orders placed or performed during the hospital encounter of 10/19/18 (from the past 48 hour(s))  CBC with Differential     Status: Abnormal    Collection Time: 10/19/18  1:28 AM  Result Value Ref Range   WBC 15.8 (H) 4.0 - 10.5 K/uL   RBC 4.42 4.22 - 5.81 MIL/uL   Hemoglobin 14.1 13.0 - 17.0 g/dL   HCT 42.5 39.0 - 52.0 %   MCV 96.2 80.0 - 100.0 fL   MCH 31.9 26.0 - 34.0 pg   MCHC 33.2 30.0 - 36.0 g/dL   RDW 12.1 11.5 - 15.5 %   Platelets 292 150 - 400 K/uL   nRBC 0.0 0.0 - 0.2 %   Neutrophils Relative % 84 %   Neutro Abs 13.1 (H) 1.7 - 7.7 K/uL   Lymphocytes Relative 11 %   Lymphs Abs 1.7 0.7 - 4.0 K/uL   Monocytes Relative 5 %   Monocytes Absolute 0.8 0.1 - 1.0 K/uL   Eosinophils Relative 0 %   Eosinophils Absolute 0.1 0.0 - 0.5 K/uL   Basophils Relative 0 %   Basophils Absolute 0.1 0.0 - 0.1 K/uL   Immature Granulocytes 0 %   Abs Immature Granulocytes 0.07 0.00 - 0.07 K/uL    Comment: Performed at [facility], [address] [place], [place] [address]  Comprehensive metabolic panel     Status: Abnormal   Collection Time: 10/19/18  1:28 AM  Result Value Ref Range   Sodium 139 135 - 145 mmol/L   Potassium 4.6 3.5 - 5.1 mmol/L   Chloride 104 98 - 111 mmol/L   CO2 24 22 - 32 mmol/L   Glucose, Bld 161 (H) 70 - 99 mg/dL   BUN 17 8 - 23 mg/dL   Creatinine, Ser 1.38 (H) 0.61 - 1.24 mg/dL   Calcium 9.6 8.9 - 10.3 mg/dL   Total Protein 7.7 6.5 - 8.1 g/dL   Albumin 4.6 3.5 - 5.0 g/dL   AST 21 15 - 41 U/L   ALT 13 0 - 44 U/L   Alkaline Phosphatase 74 38 - 126 U/L   Total Bilirubin 0.9 0.3 - 1.2 mg/dL   GFR calc non Af Amer 47 (L) >60 mL/min   GFR calc Af Amer 54 (L) >60 mL/min   Anion gap 11 5 - 15    Comment: Performed at [facility]  [address], [place], [place] [address]  Lipase, blood     Status: None   Collection Time: 10/19/18  1:28 AM  Result Value Ref Range   Lipase 25 11 - 51 U/L    Comment: Performed at [facility] [address], [place], [place] [address]  Basic metabolic panel     Status: Abnormal   Collection Time: 10/19/18  5:51 AM  Result Value Ref Range   Sodium 138 135  - 145 mmol/L   Potassium 5.0 3.5 - 5.1 mmol/L   Chloride 101 98 - 111 mmol/L   CO2 26 22 - 32 mmol/L   Glucose, Bld 132 (H) 70 - 99 mg/dL   BUN 18 8 - 23 mg/dL   Creatinine, Ser 1.34 (H) 0.61 - 1.24 mg/dL   Calcium 9.5 8.9 - 10.3 mg/dL   GFR calc non Af Amer 49 (L) >60 mL/min   GFR calc Af Amer 56 (L) >60 mL/min   Anion gap 11 5 - 15    Comment: Performed at [facility], [place] [address], [place], [place] [address]  CBC     Status: Abnormal   Collection Time: 10/19/18  5:51 AM  Result Value Ref Range   WBC 12.5 (H) 4.0 - 10.5 K/uL   RBC 4.60 4.22 - 5.81 MIL/uL   Hemoglobin 14.6 13.0 - 17.0 g/dL   HCT 44.3 39.0 - 52.0 %   MCV 96.3 80.0 - 100.0 fL   MCH 31.7 26.0 - 34.0 pg   MCHC 33.0 30.0 - 36.0 g/dL   RDW 12.1 11.5 - 15.5 %   Platelets 287 150 - 400 K/uL   nRBC 0.0 0.0 - 0.2 %    Comment: Performed at [facility], [place] [address] [place], [place] [address]  Protime-INR     Status: None   Collection Time: 10/19/18  5:51 AM  Result Value Ref Range   Prothrombin Time 13.8 11.4 - 15.2 seconds   INR 1.1 0.8 - 1.2    Comment: (NOTE) INR goal varies based on device and disease states. Performed at [facility], [place] [address] [place] [address], [place], [place] [address]   APTT     Status: None   Collection Time: 10/19/18  5:51 AM  Result Value Ref Range   aPTT 30 24 - 36 seconds    Comment: Performed at [facility] [address] [place], [place] [address]  Type and screen [place]     Status: None   Collection Time: 10/19/18  5:56 AM  Result Value Ref Range   ABO/RH(D) O POS    Antibody Screen NEG    Sample Expiration      10/22/2018,2359 Performed at [facility], [place] [address], [place], [place] [address]   Glucose, capillary     Status: Abnormal   Collection Time: 10/19/18  7:53 AM  Result Value Ref Range   Glucose-Capillary 118 (H) 70 - 99 mg/dL   Ct Abdomen Pelvis W Contrast  Result Date: 10/19/2018 CLINICAL DATA:  Epigastric  pain EXAM: CT ABDOMEN AND PELVIS WITH CONTRAST TECHNIQUE: Multidetector CT imaging of the abdomen and pelvis was performed using the standard protocol following bolus administration of intravenous contrast. CONTRAST:  124mL OMNIPAQUE IOHEXOL 300 MG/ML  SOLN COMPARISON:  CT dated 11/06/2017 FINDINGS: Lower chest: There is stable scarring and atelectasis at the lung bases. Hepatobiliary: The liver is unremarkable. The gallbladder is distended with multiple gallstones. No CT evidence  of acute cholecystitis. The common bile duct is mildly dilated but appears to taper towards the head of the pancreas. Pancreas: Unremarkable. No pancreatic ductal dilatation or surrounding inflammatory changes. Spleen: Normal in size without focal abnormality. Adrenals/Urinary Tract: Adrenal glands are unremarkable. Kidneys are normal, without renal calculi, focal lesion, or hydronephrosis. Bladder is unremarkable. Stomach/Bowel: There is stable postsurgical changes of the stomach and esophagus. The appendix is normal. There is a moderate to high-grade small bowel obstruction secondary to swirling of the mesentery in the mid abdomen. There is no pneumatosis or free air. There is a small amount of free fluid about the dilated loops of small bowel. The loops measure up to approximately 4.5 cm in diameter. The colon is unremarkable. Vascular/Lymphatic: Aortic atherosclerosis. No enlarged abdominal or pelvic lymph nodes. Reproductive: Prostate gland is significantly enlarged. Other: There is a small amount of free fluid in the pelvis, likely reactive. Musculoskeletal: No acute or significant osseous findings. IMPRESSION: 1. Moderate to high-grade small bowel obstruction. There is a transition point in the mid abdomen where there is significant swirling of the mesenteric vessels and small bowel. 2. Small amount of free fluid in the abdomen and pelvis, likely reactive to the moderate to high-grade small bowel obstruction. There is no free air.  3. Normal appendix in the right lower quadrant. 4. Stable postsurgical changes of the stomach and esophagus. 5. Cholelithiasis. The gallbladder is dilated, however there is no CT evidence of acute cholecystitis. Aortic Atherosclerosis (ICD10-I70.0). Electronically Signed   By: Constance Holster M.D.   On: 10/19/2018 03:28    Anti-infectives (From admission, onward)   None       Assessment/Plan HTN HLD DM GERD OSA Tobacco abuse AKI (baseline 0.88) - 1.38 on admission > 1.34 (6/12)  SBO - Hx of recurrent SBO (10/2016, 01/2017, 02/2017, & 11/2017) - Prior history of open abdominal surgery in 2000 & 2001 for esophageal cancer s/p gastric resection - CT abdomen and pelvisshowed a moderate to high-grade small bowel obstruction with a transition point in the mid abdomen where there is significant swirling of the mesenteric vessels and small bowel - Patient without peritonitis on exam. Clinically it does not appear patient needs emergent surgery - Patient would benefit from NG tube and SBO protocol. He is refusing. We discussed at length the reasoning for this. He would like to talk to his wife about it and for me to come back after. - Keep K>4 and Mg >2  - Mobilize for bowel function - IVF, nausea and pain medication - Bowel rest  ID - None. WBC 15,800>12,500 VTE - SCDs, okay for chemical prophylaxis from a general surgery standpoint FEN - NPO, bowel rest, IVF, K 5.0   Jillyn Ledger, [facility] 10/19/2018, 8:04 AM Pager: [phone]

## 2018-10-19 NOTE — ED Triage Notes (Signed)
Pt BIB [facility] from home, c/o epigastric pain that started suddenly 6 hours PTA. Hx SBO, LBM yesterday, denies nausea/vomiting. Per EMS, pt was diaphoretic on their arrival. Pt took omeprazole, mylanta, and hydrocodone 2 hours ago, no relief. Given 324mg  ASA by EMS.

## 2018-10-20 ENCOUNTER — Inpatient Hospital Stay (HOSPITAL_COMMUNITY): Payer: Medicare Other

## 2018-10-20 DIAGNOSIS — I10 Essential (primary) hypertension: Secondary | ICD-10-CM

## 2018-10-20 LAB — CBC
HCT: 48.4 % (ref 39.0–52.0)
Hemoglobin: 16.1 g/dL (ref 13.0–17.0)
MCH: 32.2 pg (ref 26.0–34.0)
MCHC: 33.3 g/dL (ref 30.0–36.0)
MCV: 96.8 fL (ref 80.0–100.0)
Platelets: 286 10*3/uL (ref 150–400)
RBC: 5 MIL/uL (ref 4.22–5.81)
RDW: 12.2 % (ref 11.5–15.5)
WBC: 10.2 10*3/uL (ref 4.0–10.5)
nRBC: 0 % (ref 0.0–0.2)

## 2018-10-20 LAB — BASIC METABOLIC PANEL
Anion gap: 14 (ref 5–15)
BUN: 18 mg/dL (ref 8–23)
CO2: 25 mmol/L (ref 22–32)
Calcium: 9.5 mg/dL (ref 8.9–10.3)
Chloride: 101 mmol/L (ref 98–111)
Creatinine, Ser: 1.22 mg/dL (ref 0.61–1.24)
GFR calc Af Amer: >60 mL/min (ref >60)
GFR calc non Af Amer: 54 mL/min — ABNORMAL LOW (ref >60)
Glucose, Bld: 159 mg/dL — ABNORMAL HIGH (ref 70–99)
Potassium: 4.4 mmol/L (ref 3.5–5.1)
Sodium: 140 mmol/L (ref 135–145)

## 2018-10-20 LAB — NOVEL CORONAVIRUS, NAA (HOSP ORDER, SEND-OUT TO REF LAB; TAT 18-24 HRS): SARS-CoV-2, NAA: NOT DETECTED

## 2018-10-20 LAB — GLUCOSE, CAPILLARY: Glucose-Capillary: 162 mg/dL — ABNORMAL HIGH (ref 70–99)

## 2018-10-20 IMAGING — DX DG ABD PORTABLE 1V
1 series · 1 of 1 positions shown · non-contrast
Comparison: 11/06/2017

CLINICAL DATA: Abdominal pain, distention

EXAM:
PORTABLE ABDOMEN - 1 VIEW

[abdomen kub]
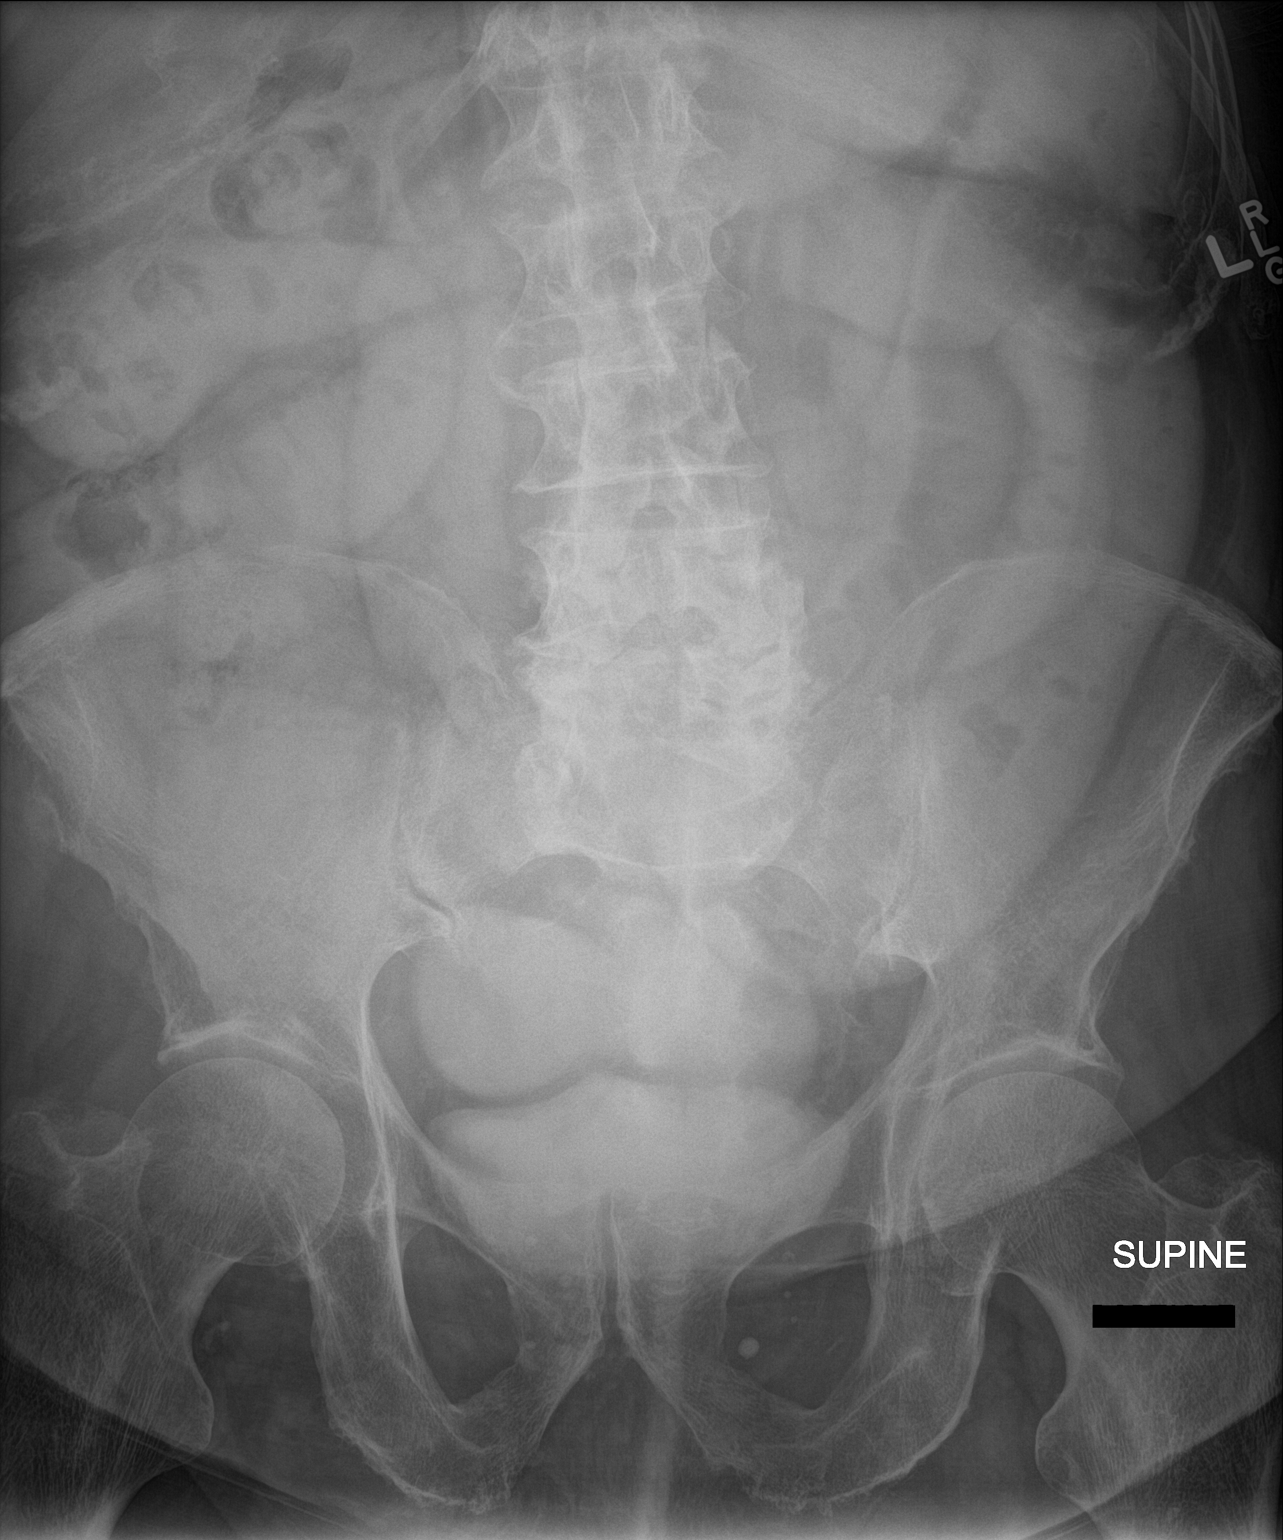

[1 of 1 positions shown; findings below may reference images not displayed]

FINDINGS: Oral contrast material again noted within dilated small bowel loops.
No definite visible colonic contrast although the contrast is
severely diluted at this point and difficult to visualize. Contrast
material within the pelvis likely within the bladder from prior IV
contrast administration. No free air organomegaly.
IMPRESSION: Dilute contrast appears to remain in dilated small bowel loops
compatible with small bowel obstruction. No visible change.

## 2018-10-20 MED ORDER — MORPHINE SULFATE (PF) 2 MG/ML IV SOLN
2.0000 mg | INTRAVENOUS | Status: DC | PRN
  Administered 2018-10-20 – 2018-10-23 (×13): 2 mg via INTRAVENOUS
  Filled 2018-10-20 (×15): qty 1

## 2018-10-20 MED ORDER — BISACODYL 10 MG RE SUPP
10.0000 mg | Freq: Once | RECTAL | Status: AC
  Administered 2018-10-20: 10:00:00 10 mg via RECTAL
  Filled 2018-10-20: qty 1

## 2018-10-20 NOTE — Progress Notes (Signed)
Pt refusing CPAP at this time.

## 2018-10-20 NOTE — Progress Notes (Signed)
  Progress Note: General Surgery Service   Assessment/Plan: Principal Problem:   SBO (small bowel obstruction) (HCC) Active Problems:   GERD   PERSONAL HISTORY MALIGNANT NEOPLASM ESOPHAGUS   HTN (hypertension)   Leukocytosis   OSA (obstructive sleep apnea)   Tobacco abuse   BPH (benign prostatic hyperplasia)   AKI (acute kidney injury) (HCC)  SBO - adhesive disease, WBC downtrending -continue vomiting overnight, XR with air in colon -suppository this morning, if no result may require NG tube -NPO  ID - None. VTE - SCDs, okay for chemical prophylaxis from a general surgery standpoint FEN - NPO, bowel rest, IVF, K 5.0    LOS: 1 day  Chief Complaint/Subjective: Continued nausea and vomiting, continued discomfort, unable to sleep well overnight  Objective: Vital signs in last 24 hours: Temp:  [97.7 F (36.5 C)-99.9 F (37.7 C)] 98.3 F (36.8 C) (06/13 0516) Pulse Rate:  [79-99] 85 (06/13 0516) Resp:  [14-20] 14 (06/13 0516) BP: (146-176)/(79-90) 176/79 (06/13 0516) SpO2:  [97 %-98 %] 98 % (06/13 0516) Last BM Date: 10/18/18  Intake/Output from previous day: 06/12 0701 - 06/13 0700 In: 0  Out: 750 [Urine:650; Emesis/NG output:100] Intake/Output this shift: No intake/output data recorded.  Lungs: nonlabored  Cardiovascular: RRR  Abd: soft, NT, ND  Extremities: no edema  Neuro: AOx4  Lab Results: CBC  Recent Labs    10/19/18 0551 10/20/18 0820  WBC 12.5* 10.2  HGB 14.6 16.1  HCT 44.3 48.4  PLT 287 286   BMET Recent Labs    10/19/18 0128 10/19/18 0551  NA 139 138  K 4.6 5.0  CL 104 101  CO2 24 26  GLUCOSE 161* 132*  BUN 17 18  CREATININE 1.38* 1.34*  CALCIUM 9.6 9.5   PT/INR Recent Labs    10/19/18 0551  LABPROT 13.8  INR 1.1   ABG No results for input(s): PHART, HCO3 in the last 72 hours.  Invalid input(s): PCO2, PO2  Studies/Results:  Anti-infectives: Anti-infectives (From admission, onward)   None      Medications:  Scheduled Meds: . bisacodyl  10 mg Rectal Once  . finasteride  5 mg Oral Daily  . metoprolol tartrate  5 mg Intravenous Q6H   Continuous Infusions: . sodium chloride 100 mL/hr at 10/20/18 0430  . famotidine (PEPCID) IV 20 mg (10/20/18 0903)   PRN Meds:.acetaminophen **OR** acetaminophen, hydrALAZINE, HYDROcodone-acetaminophen, morphine injection, ondansetron (ZOFRAN) IV  Mickeal Skinner, MD [facility]

## 2018-10-20 NOTE — Progress Notes (Signed)
   10/20/18 1847  Vitals  BP (!) 187/93  MAP (mmHg) 122  BP Location Left Arm  BP Method Automatic  Patient Position (if appropriate) Sitting  Pain Assessment  Pain Scale 0-10  Pain Score 10  MEWS Score  MEWS RR 0  MEWS Pulse 0  MEWS Systolic 0  MEWS LOC 0  MEWS Temp 0  MEWS Score 0  MEWS Score Color Lenon Oms, MD notified of pt. B/P 187/93; Verbal order to take blood pressure manually; Manual blood pressure reading is 172/70.

## 2018-10-20 NOTE — Progress Notes (Signed)
PROGRESS NOTE    DUVALL COMES   QZR:[id number]  DOB: February 23, 1933  DOA: 10/19/2018 PCP: Deland Pretty, MD   Brief Narrative:  Oscar [name] is a 85 y.o. male with medical history significant of hypertension, hyperlipidemia, diet-controlled diabetes, GERD, gout, BPH, esophageal cancer (surgery 2001), small bowel obstruction, tobacco abuse, obesity, OSA on CPAP, who presents with abdominal pain. CT abd/pelvis suggestive of high grade small bowel obstruction with transition point in the mid abdomen where there is significant swirling of the mesenteric vessels and small bowel  Admitted for SBO. General surgery was consulted as well.    Subjective: Having lower abdominal pain today which is constant. Has had at least 3-4 episodes of vomiting since yesterday. Not passing gas.     Assessment & Plan:   Principal Problem:   SBO (small bowel obstruction) - xray today shows some improvement in obstruction - general surgery recommends cont NPO and suppository - patient has declined an NG tube  Active Problems:  AKI - Cr 1.38 - basline ~ 0.88- cont IVF- holding HCTZ and Losartan - Cr improved  Leukocytosis - due to above? - has improved  HTN - change Atenolol and Losartan/HCTZ to Lopressor IV  BPH - cont Proscar    Time spent in minutes: 35 DVT prophylaxis: SCDs Code Status: Full code Family Communication:  Disposition Plan: home when stable Consultants:   General sugery Procedures:   none Antimicrobials:  Anti-infectives (From admission, onward)   None       Objective: Vitals:   10/19/18 0524 10/19/18 1355 10/19/18 1958 10/20/18 0516  BP: (!) 163/85 (!) 146/90 (!) 165/90 (!) 176/79  Pulse: 68 79 99 85  Resp: 16 17 20 14   Temp: 97.9 F (36.6 C) 97.7 F (36.5 C) 99.9 F (37.7 C) 98.3 F (36.8 C)  TempSrc: Oral Oral Oral Oral  SpO2: 100% 98% 97% 98%  Weight: 83.7 kg     Height: 6\' 1"  (1.854 m)       Intake/Output Summary (Last 24 hours) at  10/20/2018 1419 Last data filed at 10/20/2018 1004 Gross per 24 hour  Intake 0 ml  Output 350 ml  Net -350 ml   Filed Weights   10/19/18 0524  Weight: 83.7 kg    Examination: General exam: Appears comfortable  HEENT: PERRLA, oral mucosa moist, no sclera icterus or thrush Respiratory system: Clear to auscultation. Respiratory effort normal. Cardiovascular system: S1 & S2 heard, RRR.   Gastrointestinal system: Abdomen soft, -tender in mid and lower abdomen, nondistended. Poor bowel sounds. Central nervous system: Alert and oriented. No focal neurological deficits. Extremities: No cyanosis, clubbing or edema Skin: No rashes or ulcers Psychiatry:  Mood & affect appropriate.     Data Reviewed: I have personally reviewed following labs and imaging studies  CBC: Recent Labs  Lab 10/19/18 0128 10/19/18 0551 10/20/18 0820  WBC 15.8* 12.5* 10.2  NEUTROABS 13.1*  --   --   HGB 14.1 14.6 16.1  HCT 42.5 44.3 48.4  MCV 96.2 96.3 96.8  PLT 292 287 354   Basic Metabolic Panel: Recent Labs  Lab 10/19/18 0128 10/19/18 0551 10/20/18 0820  NA 139 138 140  K 4.6 5.0 4.4  CL 104 101 101  CO2 24 26 25   GLUCOSE 161* 132* 159*  BUN 17 18 18   CREATININE 1.38* 1.34* 1.22  CALCIUM 9.6 9.5 9.5   GFR: Estimated Creatinine Clearance: 51.8 mL/min (by C-G formula based on SCr of 1.22 mg/dL). Liver Function Tests: Recent Labs  Lab 10/19/18 0128  AST 21  ALT 13  ALKPHOS 74  BILITOT 0.9  PROT 7.7  ALBUMIN 4.6   Recent Labs  Lab 10/19/18 0128  LIPASE 25   No results for input(s): AMMONIA in the last 168 hours. Coagulation Profile: Recent Labs  Lab 10/19/18 0551  INR 1.1   Cardiac Enzymes: No results for input(s): CKTOTAL, CKMB, CKMBINDEX, TROPONINI in the last 168 hours. BNP (last 3 results) No results for input(s): PROBNP in the last 8760 hours. HbA1C: No results for input(s): HGBA1C in the last 72 hours. CBG: Recent Labs  Lab 10/19/18 0753 10/20/18 0733  GLUCAP  118* 162*   Lipid Profile: No results for input(s): CHOL, HDL, LDLCALC, TRIG, CHOLHDL, LDLDIRECT in the last 72 hours. Thyroid Function Tests: No results for input(s): TSH, T4TOTAL, FREET4, T3FREE, THYROIDAB in the last 72 hours. Anemia Panel: No results for input(s): VITAMINB12, FOLATE, FERRITIN, TIBC, IRON, RETICCTPCT in the last 72 hours. Urine analysis:    Component Value Date/Time   COLORURINE YELLOW 10/19/2018 0950   APPEARANCEUR CLEAR 10/19/2018 0950   LABSPEC >1.046 (H) 10/19/2018 0950   PHURINE 5.0 10/19/2018 0950   GLUCOSEU NEGATIVE 10/19/2018 0950   HGBUR MODERATE (A) 10/19/2018 0950   BILIRUBINUR NEGATIVE 10/19/2018 0950   KETONESUR NEGATIVE 10/19/2018 0950   PROTEINUR NEGATIVE 10/19/2018 0950   UROBILINOGEN 0.2 09/12/2014 1816   NITRITE NEGATIVE 10/19/2018 0950   LEUKOCYTESUR NEGATIVE 10/19/2018 0950   Sepsis Labs: @LABRCNTIP (procalcitonin:4,lacticidven:4) ) Recent Results (from the past 240 hour(s))  Novel Coronavirus,NAA,(SEND-OUT TO REF LAB - TAT 24-48 hrs); Hosp Order     Status: None   Collection Time: 10/19/18  4:13 AM   Specimen: Nasopharyngeal Swab; Respiratory  Result Value Ref Range Status   SARS-CoV-2, NAA NOT DETECTED NOT DETECTED Final    Comment: (NOTE) This test was developed and its performance characteristics determined by [employer]. This test has not been FDA cleared or approved. This test has been authorized by FDA under an Emergency Use Authorization (EUA). This test is only authorized for the duration of time the declaration that circumstances exist justifying the authorization of the emergency use of in vitro diagnostic tests for detection of SARS-CoV-2 virus and/or diagnosis of COVID-19 infection under section 564(b)(1) of the Act, 21 U.S.C. 627OJJ-0(K)(9), unless the authorization is terminated or revoked sooner. When diagnostic testing is negative, the possibility of a false negative result should be considered in the  context of a patient's recent exposures and the presence of clinical signs and symptoms consistent with COVID-19. An individual without symptoms of COVID-19 and who is not shedding SARS-CoV-2 virus would expect to have a negative (not detected) result in this assay. Performed  At: [facility] [address] [place], [place] [id number] Rush Farmer MD JI:[id number]    [place]  Final    Comment: Performed at [facility], [place] [address], [place], [place] [address]         Radiology Studies: Ct Abdomen Pelvis W Contrast  Result Date: 10/19/2018 CLINICAL DATA:  Epigastric pain EXAM: CT ABDOMEN AND PELVIS WITH CONTRAST TECHNIQUE: Multidetector CT imaging of the abdomen and pelvis was performed using the standard protocol following bolus administration of intravenous contrast. CONTRAST:  12mL OMNIPAQUE IOHEXOL 300 MG/ML  SOLN COMPARISON:  CT dated 11/06/2017 FINDINGS: Lower chest: There is stable scarring and atelectasis at the lung bases. Hepatobiliary: The liver is unremarkable. The gallbladder is distended with multiple gallstones. No CT evidence of acute cholecystitis. The common bile  duct is mildly dilated but appears to taper towards the head of the pancreas. Pancreas: Unremarkable. No pancreatic ductal dilatation or surrounding inflammatory changes. Spleen: Normal in size without focal abnormality. Adrenals/Urinary Tract: Adrenal glands are unremarkable. Kidneys are normal, without renal calculi, focal lesion, or hydronephrosis. Bladder is unremarkable. Stomach/Bowel: There is stable postsurgical changes of the stomach and esophagus. The appendix is normal. There is a moderate to high-grade small bowel obstruction secondary to swirling of the mesentery in the mid abdomen. There is no pneumatosis or free air. There is a small amount of free fluid about the dilated loops of small bowel. The loops measure up to approximately 4.5 cm in diameter. The colon  is unremarkable. Vascular/Lymphatic: Aortic atherosclerosis. No enlarged abdominal or pelvic lymph nodes. Reproductive: Prostate gland is significantly enlarged. Other: There is a small amount of free fluid in the pelvis, likely reactive. Musculoskeletal: No acute or significant osseous findings. IMPRESSION: 1. Moderate to high-grade small bowel obstruction. There is a transition point in the mid abdomen where there is significant swirling of the mesenteric vessels and small bowel. 2. Small amount of free fluid in the abdomen and pelvis, likely reactive to the moderate to high-grade small bowel obstruction. There is no free air. 3. Normal appendix in the right lower quadrant. 4. Stable postsurgical changes of the stomach and esophagus. 5. Cholelithiasis. The gallbladder is dilated, however there is no CT evidence of acute cholecystitis. Aortic Atherosclerosis (ICD10-I70.0). Electronically Signed   By: Constance Holster M.D.   On: 10/19/2018 03:28   Dg Abd Portable 1v  Result Date: 10/20/2018 CLINICAL DATA:  Small-bowel obstruction EXAM: PORTABLE ABDOMEN - 1 VIEW COMPARISON:  10/19/2018 FINDINGS: Small bowel distension remains in the left abdomen measuring 4.1 cm in diameter. There is stool in the right colon and air in the transverse colon. Postop changes in the stomach region. No abnormal calcifications. Degenerative changes of the spine. Left basilar scarring noted IMPRESSION: Improvement in small bowel distension with more air in the transverse colon compatible with resolving obstruction. Electronically Signed   By: Jerilynn Mages.  Shick M.D.   On: 10/20/2018 10:28      Scheduled Meds:  finasteride  5 mg Oral Daily   metoprolol tartrate  5 mg Intravenous Q6H   Continuous Infusions:  sodium chloride 100 mL/hr at 10/20/18 1409   famotidine (PEPCID) IV 20 mg (10/20/18 0903)     LOS: 1 day      Debbe Odea, MD [facility] Pager: [email or web address] Password [facility] 10/20/2018, 2:19 PM

## 2018-10-21 ENCOUNTER — Inpatient Hospital Stay (HOSPITAL_COMMUNITY): Payer: Medicare Other

## 2018-10-21 LAB — BASIC METABOLIC PANEL
Anion gap: 10 (ref 5–15)
BUN: 20 mg/dL (ref 8–23)
CO2: 24 mmol/L (ref 22–32)
Calcium: 9.1 mg/dL (ref 8.9–10.3)
Chloride: 105 mmol/L (ref 98–111)
Creatinine, Ser: 1.1 mg/dL (ref 0.61–1.24)
GFR calc Af Amer: >60 mL/min (ref >60)
GFR calc non Af Amer: >60 mL/min (ref >60)
Glucose, Bld: 143 mg/dL — ABNORMAL HIGH (ref 70–99)
Potassium: 4.1 mmol/L (ref 3.5–5.1)
Sodium: 139 mmol/L (ref 135–145)

## 2018-10-21 LAB — GLUCOSE, CAPILLARY: Glucose-Capillary: 116 mg/dL — ABNORMAL HIGH (ref 70–99)

## 2018-10-21 MED ORDER — GLYCERIN (LAXATIVE) 2.1 G RE SUPP
1.0000 | Freq: Once | RECTAL | Status: AC
  Administered 2018-10-21: 1 via RECTAL
  Filled 2018-10-21: qty 1

## 2018-10-21 NOTE — Progress Notes (Signed)
Patient requested that I notify the doctor for another suppository versus placing the NG tube. Grandville Silos, MD notified and gave verbal order for suppository. Will administer suppository per order.

## 2018-10-21 NOTE — Progress Notes (Signed)
  Progress Note: General Surgery Service   Assessment/Plan: Principal Problem:   SBO (small bowel obstruction) (HCC) Active Problems:   GERD   PERSONAL HISTORY MALIGNANT NEOPLASM ESOPHAGUS   HTN (hypertension)   Leukocytosis   OSA (obstructive sleep apnea)   Tobacco abuse   BPH (benign prostatic hyperplasia)   AKI (acute kidney injury) (HCC)  SBO - adhesive disease, WBC downtrending -continue vomiting overnight -suppository this morning, if no result will require NG tube -NPO -XR later today  ID -None. VTE -SCDs, okay for chemical prophylaxis from a general surgery standpoint FEN -NPO, bowel rest, IVF    LOS: 2 days  Chief Complaint/Subjective: Continued vomiting, felt urge for bowel movement this morning, did not take suppository yesterday  Objective: Vital signs in last 24 hours: Temp:  [97.8 F (36.6 C)-98.3 F (36.8 C)] 98.3 F (36.8 C) (06/14 0555) Pulse Rate:  [85-91] 85 (06/14 0555) Resp:  [15-18] 18 (06/14 0555) BP: (151-188)/(82-97) 176/88 (06/14 0555) SpO2:  [97 %] 97 % (06/14 0555) Last BM Date: 10/18/18  Intake/Output from previous day: 06/13 0701 - 06/14 0700 In: 4483.2 [P.O.:60; I.V.:4223.2; IV Piggyback:200] Out: 620 [Urine:600; Emesis/NG output:20] Intake/Output this shift: No intake/output data recorded.  Lungs: nonlabored  Cardiovascular: RRR  Abd: soft, distended, minimal tenderness  Extremities: no edema  Neuro: AOx4  Lab Results: CBC  Recent Labs    10/19/18 0551 10/20/18 0820  WBC 12.5* 10.2  HGB 14.6 16.1  HCT 44.3 48.4  PLT 287 286   BMET Recent Labs    10/20/18 0820 10/21/18 0308  NA 140 139  K 4.4 4.1  CL 101 105  CO2 25 24  GLUCOSE 159* 143*  BUN 18 20  CREATININE 1.22 1.10  CALCIUM 9.5 9.1   PT/INR Recent Labs    10/19/18 0551  LABPROT 13.8  INR 1.1   ABG No results for input(s): PHART, HCO3 in the last 72 hours.  Invalid input(s): PCO2, PO2  Studies/Results:  Anti-infectives:  Anti-infectives (From admission, onward)   None      Medications: Scheduled Meds: . finasteride  5 mg Oral Daily  . Glycerin (Adult)  1 suppository Rectal Once  . metoprolol tartrate  5 mg Intravenous Q6H   Continuous Infusions: . sodium chloride 100 mL/hr at 10/21/18 0400  . famotidine (PEPCID) IV Stopped (10/20/18 2312)   PRN Meds:.acetaminophen **OR** acetaminophen, hydrALAZINE, HYDROcodone-acetaminophen, morphine injection, ondansetron (ZOFRAN) IV  Mickeal Skinner, MD [facility]

## 2018-10-21 NOTE — Progress Notes (Signed)
PROGRESS NOTE    Oscar [name]   [name]  DOB: 1932/08/23  DOA: 10/19/2018 PCP: Deland Pretty, MD   Brief Narrative:  Oscar [name] is a 85 y.o. male with medical history significant of hypertension, hyperlipidemia, diet-controlled diabetes, GERD, gout, BPH, esophageal cancer (surgery 2001), small bowel obstruction, tobacco abuse, obesity, OSA on CPAP, who presents with abdominal pain. CT abd/pelvis suggestive of high grade small bowel obstruction with transition point in the mid abdomen where there is significant swirling of the mesenteric vessels and small bowel  Admitted for SBO. General surgery was consulted as well.    Subjective: Continues to have mid and lower abdominal pain and vomiting. Not passing gas or having BMs. He declined to use the suppository yet.     Assessment & Plan:   Principal Problem:   SBO (small bowel obstruction)  - repeat Xray today> No small bowel dilatation  - general surgery recommends cont NPO and suppository (he declined it yesterday) - patient has declined an NG tube  Active Problems:  AKI - Cr 1.38 - basline ~ 0.88- cont IVF- holding HCTZ and Losartan - Cr improved  Leukocytosis - due to above? - has improved  HTN - change Atenolol and Losartan/HCTZ to Lopressor IV  BPH - cont Proscar    Time spent in minutes: 35 DVT prophylaxis: SCDs Code Status: Full code Family Communication:  Disposition Plan: home when stable Consultants:   General sugery Procedures:   none Antimicrobials:  Anti-infectives (From admission, onward)   None       Objective: Vitals:   10/20/18 2008 10/21/18 0136 10/21/18 0555 10/21/18 1250  BP: (!) 188/89 (!) 151/97 (!) 176/88 (!) 161/80  Pulse: 88  85 90  Resp: 18  18   Temp: 98 F (36.7 C)  98.3 F (36.8 C)   TempSrc: Oral  Oral   SpO2: 97%  97%   Weight:      Height:        Intake/Output Summary (Last 24 hours) at 10/21/2018 1319 Last data filed at 10/21/2018 0400 Gross  per 24 hour  Intake 4483.19 ml  Output 620 ml  Net 3863.19 ml   Filed Weights   10/19/18 0524  Weight: 83.7 kg    Examination: General exam: Appears comfortable  HEENT: PERRLA, oral mucosa moist, no sclera icterus or thrush Respiratory system: Clear to auscultation. Respiratory effort normal. Cardiovascular system: S1 & S2 heard,  No murmurs  Gastrointestinal system: Abdomen soft,  Tender in mid and lower abdomen, nondistended. Poor bowel sounds   Central nervous system: Alert and oriented. No focal neurological deficits. Extremities: No cyanosis, clubbing or edema Skin: No rashes or ulcers Psychiatry:  Mood & affect appropriate.      Data Reviewed: I have personally reviewed following labs and imaging studies  CBC: Recent Labs  Lab 10/19/18 0128 10/19/18 0551 10/20/18 0820  WBC 15.8* 12.5* 10.2  NEUTROABS 13.1*  --   --   HGB 14.1 14.6 16.1  HCT 42.5 44.3 48.4  MCV 96.2 96.3 96.8  PLT 292 287 841   Basic Metabolic Panel: Recent Labs  Lab 10/19/18 0128 10/19/18 0551 10/20/18 0820 10/21/18 0308  NA 139 138 140 139  K 4.6 5.0 4.4 4.1  CL 104 101 101 105  CO2 24 26 25 24   GLUCOSE 161* 132* 159* 143*  BUN 17 18 18 20   CREATININE 1.38* 1.34* 1.22 1.10  CALCIUM 9.6 9.5 9.5 9.1   GFR: Estimated Creatinine Clearance: 57.5 mL/min (by  C-G formula based on SCr of 1.1 mg/dL). Liver Function Tests: Recent Labs  Lab 10/19/18 0128  AST 21  ALT 13  ALKPHOS 74  BILITOT 0.9  PROT 7.7  ALBUMIN 4.6   Recent Labs  Lab 10/19/18 0128  LIPASE 25   No results for input(s): AMMONIA in the last 168 hours. Coagulation Profile: Recent Labs  Lab 10/19/18 0551  INR 1.1   Cardiac Enzymes: No results for input(s): CKTOTAL, CKMB, CKMBINDEX, TROPONINI in the last 168 hours. BNP (last 3 results) No results for input(s): PROBNP in the last 8760 hours. HbA1C: No results for input(s): HGBA1C in the last 72 hours. CBG: Recent Labs  Lab 10/19/18 0753 10/20/18 0733  10/21/18 0806  GLUCAP 118* 162* 116*   Lipid Profile: No results for input(s): CHOL, HDL, LDLCALC, TRIG, CHOLHDL, LDLDIRECT in the last 72 hours. Thyroid Function Tests: No results for input(s): TSH, T4TOTAL, FREET4, T3FREE, THYROIDAB in the last 72 hours. Anemia Panel: No results for input(s): VITAMINB12, FOLATE, FERRITIN, TIBC, IRON, RETICCTPCT in the last 72 hours. Urine analysis:    Component Value Date/Time   COLORURINE YELLOW 10/19/2018 0950   APPEARANCEUR CLEAR 10/19/2018 0950   LABSPEC >1.046 (H) 10/19/2018 0950   PHURINE 5.0 10/19/2018 0950   GLUCOSEU NEGATIVE 10/19/2018 0950   HGBUR MODERATE (A) 10/19/2018 0950   BILIRUBINUR NEGATIVE 10/19/2018 0950   KETONESUR NEGATIVE 10/19/2018 0950   PROTEINUR NEGATIVE 10/19/2018 0950   UROBILINOGEN 0.2 09/12/2014 1816   NITRITE NEGATIVE 10/19/2018 0950   LEUKOCYTESUR NEGATIVE 10/19/2018 0950   Sepsis Labs: @LABRCNTIP (procalcitonin:4,lacticidven:4) ) Recent Results (from the past 240 hour(s))  Novel Coronavirus,NAA,(SEND-OUT TO REF LAB - TAT 24-48 hrs); Hosp Order     Status: None   Collection Time: 10/19/18  4:13 AM   Specimen: Nasopharyngeal Swab; Respiratory  Result Value Ref Range Status   SARS-CoV-2, NAA NOT DETECTED NOT DETECTED Final    Comment: (NOTE) This test was developed and its performance characteristics determined by [employer]. This test has not been FDA cleared or approved. This test has been authorized by FDA under an Emergency Use Authorization (EUA). This test is only authorized for the duration of time the declaration that circumstances exist justifying the authorization of the emergency use of in vitro diagnostic tests for detection of SARS-CoV-2 virus and/or diagnosis of COVID-19 infection under section 564(b)(1) of the Act, 21 U.S.C. 683MHD-6(Q)(2), unless the authorization is terminated or revoked sooner. When diagnostic testing is negative, the possibility of a false negative result  should be considered in the context of a patient's recent exposures and the presence of clinical signs and symptoms consistent with COVID-19. An individual without symptoms of COVID-19 and who is not shedding SARS-CoV-2 virus would expect to have a negative (not detected) result in this assay. Performed  At: [facility] [address] [place], [place] [id number] Rush Farmer MD HE:[id number]    [place]  Final    Comment: Performed at [facility], [place] [address], [place], [place] [address]         Radiology Studies: Dg Abd Portable 1v  Result Date: 10/21/2018 CLINICAL DATA:  Evaluate for small bowel obstruction. EXAM: PORTABLE ABDOMEN - 1 VIEW COMPARISON:  October 20, 2018 FINDINGS: No dilated loops of small bowel identified on today's study. There is a paucity of small bowel gas somewhat limiting evaluation. No other abnormalities identified. IMPRESSION: No small bowel dilatation identified on today's study. There is a paucity of small bowel gas. Electronically Signed  By: Dorise Bullion III M.D   On: 10/21/2018 09:14   Dg Abd Portable 1v  Result Date: 10/20/2018 CLINICAL DATA:  Small-bowel obstruction EXAM: PORTABLE ABDOMEN - 1 VIEW COMPARISON:  10/19/2018 FINDINGS: Small bowel distension remains in the left abdomen measuring 4.1 cm in diameter. There is stool in the right colon and air in the transverse colon. Postop changes in the stomach region. No abnormal calcifications. Degenerative changes of the spine. Left basilar scarring noted IMPRESSION: Improvement in small bowel distension with more air in the transverse colon compatible with resolving obstruction. Electronically Signed   By: Jerilynn Mages.  Shick M.D.   On: 10/20/2018 10:28      Scheduled Meds: . finasteride  5 mg Oral Daily  . metoprolol tartrate  5 mg Intravenous Q6H   Continuous Infusions: . sodium chloride 100 mL/hr at 10/21/18 1254  . famotidine (PEPCID) IV 20 mg (10/21/18 0927)      LOS: 2 days      Debbe Odea, MD [facility] Pager: [email or web address] Password [email or web address] 10/21/2018, 1:19 PM

## 2018-10-22 ENCOUNTER — Inpatient Hospital Stay (HOSPITAL_COMMUNITY): Payer: Medicare Other

## 2018-10-22 LAB — BASIC METABOLIC PANEL
Anion gap: 9 (ref 5–15)
BUN: 17 mg/dL (ref 8–23)
CO2: 25 mmol/L (ref 22–32)
Calcium: 8.7 mg/dL — ABNORMAL LOW (ref 8.9–10.3)
Chloride: 107 mmol/L (ref 98–111)
Creatinine, Ser: 0.99 mg/dL (ref 0.61–1.24)
GFR calc Af Amer: >60 mL/min (ref >60)
GFR calc non Af Amer: >60 mL/min (ref >60)
Glucose, Bld: 92 mg/dL (ref 70–99)
Potassium: 3.4 mmol/L — ABNORMAL LOW (ref 3.5–5.1)
Sodium: 141 mmol/L (ref 135–145)

## 2018-10-22 LAB — CBC
HCT: 38.4 % — ABNORMAL LOW (ref 39.0–52.0)
Hemoglobin: 12.4 g/dL — ABNORMAL LOW (ref 13.0–17.0)
MCH: 31.6 pg (ref 26.0–34.0)
MCHC: 32.3 g/dL (ref 30.0–36.0)
MCV: 97.7 fL (ref 80.0–100.0)
Platelets: 266 10*3/uL (ref 150–400)
RBC: 3.93 MIL/uL — ABNORMAL LOW (ref 4.22–5.81)
RDW: 11.9 % (ref 11.5–15.5)
WBC: 8.5 10*3/uL (ref 4.0–10.5)
nRBC: 0 % (ref 0.0–0.2)

## 2018-10-22 LAB — GLUCOSE, CAPILLARY: Glucose-Capillary: 80 mg/dL (ref 70–99)

## 2018-10-22 MED ORDER — GLYCERIN (LAXATIVE) 2.1 G RE SUPP
1.0000 | Freq: Once | RECTAL | Status: AC
  Administered 2018-10-22: 11:00:00 1 via RECTAL
  Filled 2018-10-22: qty 1

## 2018-10-22 MED ORDER — DOCUSATE SODIUM 100 MG PO CAPS
100.0000 mg | ORAL_CAPSULE | Freq: Two times a day (BID) | ORAL | Status: DC
  Administered 2018-10-22: 100 mg via ORAL
  Filled 2018-10-22 (×2): qty 1

## 2018-10-22 MED ORDER — POLYETHYLENE GLYCOL 3350 17 G PO PACK
17.0000 g | PACK | Freq: Two times a day (BID) | ORAL | Status: DC
  Administered 2018-10-22: 11:00:00 17 g via ORAL
  Filled 2018-10-22 (×2): qty 1

## 2018-10-22 MED ORDER — POTASSIUM CHLORIDE 10 MEQ/100ML IV SOLN
10.0000 meq | INTRAVENOUS | Status: AC
  Administered 2018-10-22 (×4): 10 meq via INTRAVENOUS
  Filled 2018-10-22 (×4): qty 100

## 2018-10-22 NOTE — Care Management Important Message (Signed)
Important Message  Patient Details  Name: Oscar [name] MRN: [id number] Date of Birth: 1933-01-06   Medicare Important Message Given:  Yes    Memory Argue 10/22/2018, 4:04 PM

## 2018-10-22 NOTE — Progress Notes (Signed)
[facility] Progress Note     Subjective: CC: sbo Patient feeling less nauseated today and passed maybe a small amount of stool after suppositories. Denies flatus. Does not want an NGT.   Objective: Vital signs in last 24 hours: Temp:  [97.7 F (36.5 C)-98.3 F (36.8 C)] 98.3 F (36.8 C) (06/15 0450) Pulse Rate:  [74-90] 77 (06/15 0450) Resp:  [17-18] 18 (06/15 0450) BP: (157-184)/(75-84) 157/75 (06/15 0450) SpO2:  [97 %-98 %] 97 % (06/15 0450) Last BM Date: 10/18/18  Intake/Output from previous day: 06/14 0701 - 06/15 0700 In: 0  Out: 800 [Urine:800] Intake/Output this shift: No intake/output data recorded.  PE: Gen:  Alert, NAD, pleasant Card:  Regular rate and rhythm Pulm:  Normal effort, clear to auscultation bilaterally Abd: Soft, non-tender, moderately distended, bowel sounds hypoactive, midline scar Skin: warm and dry, no rashes  Psych: A&Ox3   Lab Results:  Recent Labs    10/20/18 0820  WBC 10.2  HGB 16.1  HCT 48.4  PLT 286   BMET Recent Labs    10/20/18 0820 10/21/18 0308  NA 140 139  K 4.4 4.1  CL 101 105  CO2 25 24  GLUCOSE 159* 143*  BUN 18 20  CREATININE 1.22 1.10  CALCIUM 9.5 9.1   PT/INR No results for input(s): LABPROT, INR in the last 72 hours. CMP     Component Value Date/Time   NA 139 10/21/2018 0308   K 4.1 10/21/2018 0308   CL 105 10/21/2018 0308   CO2 24 10/21/2018 0308   GLUCOSE 143 (H) 10/21/2018 0308   BUN 20 10/21/2018 0308   CREATININE 1.10 10/21/2018 0308   CREATININE 1.13 12/07/2012 1147   CALCIUM 9.1 10/21/2018 0308   PROT 7.7 10/19/2018 0128   ALBUMIN 4.6 10/19/2018 0128   AST 21 10/19/2018 0128   ALT 13 10/19/2018 0128   ALKPHOS 74 10/19/2018 0128   BILITOT 0.9 10/19/2018 0128   GFRNONAA >60 10/21/2018 0308   GFRAA >60 10/21/2018 0308   Lipase     Component Value Date/Time   LIPASE 25 10/19/2018 0128       Studies/Results: Dg Abd Portable 1v  Result Date: 10/21/2018 CLINICAL DATA:   Evaluate for small bowel obstruction. EXAM: PORTABLE ABDOMEN - 1 VIEW COMPARISON:  October 20, 2018 FINDINGS: No dilated loops of small bowel identified on today's study. There is a paucity of small bowel gas somewhat limiting evaluation. No other abnormalities identified. IMPRESSION: No small bowel dilatation identified on today's study. There is a paucity of small bowel gas. Electronically Signed   By: Dorise Bullion III M.D   On: 10/21/2018 09:14   Dg Abd Portable 1v  Result Date: 10/20/2018 CLINICAL DATA:  Small-bowel obstruction EXAM: PORTABLE ABDOMEN - 1 VIEW COMPARISON:  10/19/2018 FINDINGS: Small bowel distension remains in the left abdomen measuring 4.1 cm in diameter. There is stool in the right colon and air in the transverse colon. Postop changes in the stomach region. No abnormal calcifications. Degenerative changes of the spine. Left basilar scarring noted IMPRESSION: Improvement in small bowel distension with more air in the transverse colon compatible with resolving obstruction. Electronically Signed   By: Jerilynn Mages.  Shick M.D.   On: 10/20/2018 10:28    Anti-infectives: Anti-infectives (From admission, onward)   None       Assessment/Plan HTN HLD DM GERD OSA Tobacco abuse AKI (baseline 0.88) - 1.1 yesterday  SBO - Hx of recurrent SBO (10/2016, 01/2017, 02/2017, & 11/2017) - Prior history  of open abdominal surgery in 2000 & 2001 for esophageal cancer s/p gastric resection - CT abdomen and pelvis showed a moderate to high-grade small bowel obstruction with a transition point in the mid abdomen where there is significant swirling of the mesenteric vessels and small bowel - Patient without peritonitis on exam. Clinically it does not appear patient needs emergent surgery - film yesterday with no small bowel dilatation, repeat film today - ok to have sips of clears  - repeat suppository, add colace and miralax   ID - No current abx VTE - SCDs FEN - IVF, sips of clears   LOS: 3  days    Brigid Re , [facility] 10/22/2018, 8:29 AM Pager: [place]: [phone]

## 2018-10-22 NOTE — Care Management Important Message (Signed)
Important Message  Patient Details  Name: Oscar [name] MRN: [id number] Date of Birth: Nov 18, 1932   Medicare Important Message Given:  Yes    Memory Argue 10/22/2018, 4:01 PM

## 2018-10-22 NOTE — Progress Notes (Signed)
Abdiminal xray done to confirm NG tube placement. Paged and confirmed NG tube placement with Dr Wynelle Cleveland, ok to use.

## 2018-10-22 NOTE — Progress Notes (Addendum)
PROGRESS NOTE    Oscar [name]   HER:[id number]  DOB: 1933/03/27  DOA: 10/19/2018 PCP: Deland Pretty, MD   Brief Narrative:  Oscar [name] is a 85 y.o. male with medical history significant of hypertension, hyperlipidemia, diet-controlled diabetes, GERD, gout, BPH, esophageal cancer (surgery 2001), small bowel obstruction, tobacco abuse, obesity, OSA on CPAP, who presents with abdominal pain. CT abd/pelvis suggestive of high grade small bowel obstruction with transition point in the mid abdomen where there is significant swirling of the mesenteric vessels and small bowel  Admitted for SBO. General surgery was consulted as well.    Subjective: Abdominal pain is not as severe. He vomited once yesterday. No BM. Thinks he is passing gas.     Assessment & Plan:   Principal Problem:   SBO (small bowel obstruction)  - repeat Xray  > No small bowel dilatation  - patient has declined an NG tube - general surgery assisting with management    Active Problems:  AKI - Cr 1.38 - basline ~ 0.88- cont IVF- holding HCTZ and Losartan - Cr improved  Leukocytosis - due to above? - has improved  HTN - changed Atenolol and Losartan/HCTZ to Lopressor IV  BPH - cont Proscar   Hypokalemia - replace and follow    Time spent in minutes: 35 DVT prophylaxis: SCDs Code Status: Full code Family Communication:  Disposition Plan: home when stable Consultants:   General sugery Procedures:   none Antimicrobials:  Anti-infectives (From admission, onward)   None       Objective: Vitals:   10/21/18 1437 10/21/18 2101 10/21/18 2103 10/22/18 0450  BP: (!) 174/84 (!) 184/82 (!) 184/82 (!) 157/75  Pulse: 74 87 87 77  Resp: 17 17 17 18   Temp: 97.7 F (36.5 C) 98.3 F (36.8 C) 98.3 F (36.8 C) 98.3 F (36.8 C)  TempSrc: Oral Oral Oral Oral  SpO2: 98% 98% 98% 97%  Weight:      Height:        Intake/Output Summary (Last 24 hours) at 10/22/2018 1447 Last data filed at  10/22/2018 1300 Gross per 24 hour  Intake 120 ml  Output 300 ml  Net -180 ml   Filed Weights   10/19/18 0524  Weight: 83.7 kg    Examination:   General exam: Appears comfortable  HEENT: PERRLA, oral mucosa moist, no sclera icterus or thrush Respiratory system: Clear to auscultation. Respiratory effort normal. Cardiovascular system: S1 & S2 heard,  No murmurs  Gastrointestinal system: Abdomen soft,  Tender in mid abdomen, nondistended.  slow bowel sounds   Central nervous system: Alert and oriented. No focal neurological deficits. Extremities: No cyanosis, clubbing or edema Skin: No rashes or ulcers Psychiatry:  Mood & affect appropriate.     Data Reviewed: I have personally reviewed following labs and imaging studies  CBC: Recent Labs  Lab 10/19/18 0128 10/19/18 0551 10/20/18 0820 10/22/18 1019  WBC 15.8* 12.5* 10.2 8.5  NEUTROABS 13.1*  --   --   --   HGB 14.1 14.6 16.1 12.4*  HCT 42.5 44.3 48.4 38.4*  MCV 96.2 96.3 96.8 97.7  PLT 292 287 286 856   Basic Metabolic Panel: Recent Labs  Lab 10/19/18 0128 10/19/18 0551 10/20/18 0820 10/21/18 0308 10/22/18 1019  NA 139 138 140 139 141  K 4.6 5.0 4.4 4.1 3.4*  CL 104 101 101 105 107  CO2 24 26 25 24 25   GLUCOSE 161* 132* 159* 143* 92  BUN 17 18 18  20  17  CREATININE 1.38* 1.34* 1.22 1.10 0.99  CALCIUM 9.6 9.5 9.5 9.1 8.7*   GFR: Estimated Creatinine Clearance: 63.9 mL/min (by C-G formula based on SCr of 0.99 mg/dL). Liver Function Tests: Recent Labs  Lab 10/19/18 0128  AST 21  ALT 13  ALKPHOS 74  BILITOT 0.9  PROT 7.7  ALBUMIN 4.6   Recent Labs  Lab 10/19/18 0128  LIPASE 25   No results for input(s): AMMONIA in the last 168 hours. Coagulation Profile: Recent Labs  Lab 10/19/18 0551  INR 1.1   Cardiac Enzymes: No results for input(s): CKTOTAL, CKMB, CKMBINDEX, TROPONINI in the last 168 hours. BNP (last 3 results) No results for input(s): PROBNP in the last 8760 hours. HbA1C: No results  for input(s): HGBA1C in the last 72 hours. CBG: Recent Labs  Lab 10/19/18 0753 10/20/18 0733 10/21/18 0806 10/22/18 0800  GLUCAP 118* 162* 116* 80   Lipid Profile: No results for input(s): CHOL, HDL, LDLCALC, TRIG, CHOLHDL, LDLDIRECT in the last 72 hours. Thyroid Function Tests: No results for input(s): TSH, T4TOTAL, FREET4, T3FREE, THYROIDAB in the last 72 hours. Anemia Panel: No results for input(s): VITAMINB12, FOLATE, FERRITIN, TIBC, IRON, RETICCTPCT in the last 72 hours. Urine analysis:    Component Value Date/Time   COLORURINE YELLOW 10/19/2018 0950   APPEARANCEUR CLEAR 10/19/2018 0950   LABSPEC >1.046 (H) 10/19/2018 0950   PHURINE 5.0 10/19/2018 0950   GLUCOSEU NEGATIVE 10/19/2018 0950   HGBUR MODERATE (A) 10/19/2018 0950   BILIRUBINUR NEGATIVE 10/19/2018 0950   KETONESUR NEGATIVE 10/19/2018 0950   PROTEINUR NEGATIVE 10/19/2018 0950   UROBILINOGEN 0.2 09/12/2014 1816   NITRITE NEGATIVE 10/19/2018 0950   LEUKOCYTESUR NEGATIVE 10/19/2018 0950   Sepsis Labs: @LABRCNTIP (procalcitonin:4,lacticidven:4) ) Recent Results (from the past 240 hour(s))  Novel Coronavirus,NAA,(SEND-OUT TO REF LAB - TAT 24-48 hrs); Hosp Order     Status: None   Collection Time: 10/19/18  4:13 AM   Specimen: Nasopharyngeal Swab; Respiratory  Result Value Ref Range Status   SARS-CoV-2, NAA NOT DETECTED NOT DETECTED Final    Comment: (NOTE) This test was developed and its performance characteristics determined by [employer]. This test has not been FDA cleared or approved. This test has been authorized by FDA under an Emergency Use Authorization (EUA). This test is only authorized for the duration of time the declaration that circumstances exist justifying the authorization of the emergency use of in vitro diagnostic tests for detection of SARS-CoV-2 virus and/or diagnosis of COVID-19 infection under section 564(b)(1) of the Act, 21 U.S.C. 983JAS-5(K)(5), unless the authorization is  terminated or revoked sooner. When diagnostic testing is negative, the possibility of a false negative result should be considered in the context of a patient's recent exposures and the presence of clinical signs and symptoms consistent with COVID-19. An individual without symptoms of COVID-19 and who is not shedding SARS-CoV-2 virus would expect to have a negative (not detected) result in this assay. Performed  At: [facility] [address] [place], [place] [id number] Rush Farmer MD FX:[id number]    [place]  Final    Comment: Performed at [facility], [place] [address], [place], [place] [address]         Radiology Studies: Dg Abd Portable 1v  Result Date: 10/22/2018 CLINICAL DATA:  Patient with history of constipation. EXAM: PORTABLE ABDOMEN - 1 VIEW COMPARISON:  Abdominal radiograph 10/21/2018. FINDINGS: Stool within the cecum and ascending colon. No distended loops of bowel are identified although there is a  paucity of small bowel gas. Lumbar spine degenerative changes. Pelvic phleboliths. IMPRESSION: Stool within the cecum and ascending colon. Paucity of small bowel gas. Electronically Signed   By: Lovey Newcomer M.D.   On: 10/22/2018 10:18   Dg Abd Portable 1v  Result Date: 10/21/2018 CLINICAL DATA:  Evaluate for small bowel obstruction. EXAM: PORTABLE ABDOMEN - 1 VIEW COMPARISON:  October 20, 2018 FINDINGS: No dilated loops of small bowel identified on today's study. There is a paucity of small bowel gas somewhat limiting evaluation. No other abnormalities identified. IMPRESSION: No small bowel dilatation identified on today's study. There is a paucity of small bowel gas. Electronically Signed   By: Dorise Bullion III M.D   On: 10/21/2018 09:14      Scheduled Meds: . docusate sodium  100 mg Oral BID  . finasteride  5 mg Oral Daily  . metoprolol tartrate  5 mg Intravenous Q6H  . polyethylene glycol  17 g Oral BID   Continuous Infusions:  . sodium chloride 100 mL/hr at 10/22/18 0814  . famotidine (PEPCID) IV 20 mg (10/22/18 1134)     LOS: 3 days      Debbe Odea, MD [facility] Pager: [email or web address] Password [facility] 10/22/2018, 2:47 PM

## 2018-10-23 ENCOUNTER — Inpatient Hospital Stay (HOSPITAL_COMMUNITY): Payer: Medicare Other

## 2018-10-23 LAB — MAGNESIUM: Magnesium: 1.5 mg/dL — ABNORMAL LOW (ref 1.7–2.4)

## 2018-10-23 LAB — BASIC METABOLIC PANEL
Anion gap: 11 (ref 5–15)
BUN: 15 mg/dL (ref 8–23)
CO2: 22 mmol/L (ref 22–32)
Calcium: 8.7 mg/dL — ABNORMAL LOW (ref 8.9–10.3)
Chloride: 107 mmol/L (ref 98–111)
Creatinine, Ser: 0.96 mg/dL (ref 0.61–1.24)
GFR calc Af Amer: >60 mL/min (ref >60)
GFR calc non Af Amer: >60 mL/min (ref >60)
Glucose, Bld: 75 mg/dL (ref 70–99)
Potassium: 3.7 mmol/L (ref 3.5–5.1)
Sodium: 140 mmol/L (ref 135–145)

## 2018-10-23 LAB — GLUCOSE, CAPILLARY
Glucose-Capillary: 110 mg/dL — ABNORMAL HIGH (ref 70–99)
Glucose-Capillary: 62 mg/dL — ABNORMAL LOW (ref 70–99)

## 2018-10-23 MED ORDER — MAGNESIUM SULFATE 2 GM/50ML IV SOLN
2.0000 g | Freq: Once | INTRAVENOUS | Status: AC
  Administered 2018-10-23: 2 g via INTRAVENOUS
  Filled 2018-10-23: qty 50

## 2018-10-23 MED ORDER — DIATRIZOATE MEGLUMINE & SODIUM 66-10 % PO SOLN
90.0000 mL | Freq: Once | ORAL | Status: AC
  Administered 2018-10-23: 90 mL via NASOGASTRIC
  Filled 2018-10-23: qty 90

## 2018-10-23 MED ORDER — DEXTROSE 50 % IV SOLN
INTRAVENOUS | Status: AC
  Administered 2018-10-23: 25 mL
  Filled 2018-10-23: qty 50

## 2018-10-23 MED ORDER — POTASSIUM CHLORIDE 10 MEQ/100ML IV SOLN
10.0000 meq | INTRAVENOUS | Status: AC
  Administered 2018-10-23 (×2): 10 meq via INTRAVENOUS
  Filled 2018-10-23 (×2): qty 100

## 2018-10-23 MED ORDER — KCL IN DEXTROSE-NACL 20-5-0.9 MEQ/L-%-% IV SOLN
INTRAVENOUS | Status: DC
  Administered 2018-10-23 – 2018-10-25 (×5): via INTRAVENOUS
  Filled 2018-10-23 (×7): qty 1000

## 2018-10-23 NOTE — Progress Notes (Signed)
Hypoglycemic Event  CBG: 62  Treatment: D50 25 mL (12.5 gm)  Symptoms: None  Follow-up CBG: Time:0800 CBG Result:110  Possible Reasons for Event: Other: Patient NPO  Comments/MD notified:Dr. Wynelle Cleveland paged, awaiting orders    Erle Crocker

## 2018-10-23 NOTE — Progress Notes (Signed)
[facility] Progress Note     Subjective: CC: sbo Patient telling me he wants food this AM. Reports passing some flatus. Denies nausea currently. BM recorded but patient states it was not a real BM, mostly just the suppository.   Objective: Vital signs in last 24 hours: Temp:  [98 F (36.7 C)-98.9 F (37.2 C)] 98 F (36.7 C) (06/16 0500) Pulse Rate:  [68-84] 68 (06/16 0500) Resp:  [18] 18 (06/16 0500) BP: (155-176)/(66-80) 155/66 (06/16 0500) SpO2:  [95 %-97 %] 95 % (06/16 0500) Last BM Date: 10/21/18  Intake/Output from previous day: 06/15 0701 - 06/16 0700 In: 1056.4 [P.O.:180; I.V.:800; IV Piggyback:76.4] Out: 2100 [Urine:1100; Emesis/NG output:1000] Intake/Output this shift: No intake/output data recorded.  PE: Gen:  Alert, NAD, pleasant Card:  Regular rate and rhythm Pulm:  Normal effort, clear to auscultation bilaterally Abd: Soft, non-tender, moderately distended, bowel sounds hypoactive, midline scar, NGT with feculent appearing drainage Skin: warm and dry, no rashes  Psych: A&Ox3   Lab Results:  Recent Labs    10/22/18 1019  WBC 8.5  HGB 12.4*  HCT 38.4*  PLT 266   BMET Recent Labs    10/22/18 1019 10/23/18 0318  NA 141 140  K 3.4* 3.7  CL 107 107  CO2 25 22  GLUCOSE 92 75  BUN 17 15  CREATININE 0.99 0.96  CALCIUM 8.7* 8.7*   PT/INR No results for input(s): LABPROT, INR in the last 72 hours. CMP     Component Value Date/Time   NA 140 10/23/2018 0318   K 3.7 10/23/2018 0318   CL 107 10/23/2018 0318   CO2 22 10/23/2018 0318   GLUCOSE 75 10/23/2018 0318   BUN 15 10/23/2018 0318   CREATININE 0.96 10/23/2018 0318   CREATININE 1.13 12/07/2012 1147   CALCIUM 8.7 (L) 10/23/2018 0318   PROT 7.7 10/19/2018 0128   ALBUMIN 4.6 10/19/2018 0128   AST 21 10/19/2018 0128   ALT 13 10/19/2018 0128   ALKPHOS 74 10/19/2018 0128   BILITOT 0.9 10/19/2018 0128   GFRNONAA >60 10/23/2018 0318   GFRAA >60 10/23/2018 0318   Lipase      Component Value Date/Time   LIPASE 25 10/19/2018 0128       Studies/Results: Dg Abd 1 View  Result Date: 10/22/2018 CLINICAL DATA:  Enteric tube placement EXAM: ABDOMEN - 1 VIEW COMPARISON:  Abdominal radiograph dated 10/22/2018, T is piece should computer FINDINGS: The enteric tube projects over the heart and is likely within the patient's intrathoracic stomach. The tube appears kinked. The visualized bowel gas pattern is nonspecific. Multiple surgical clips project over the left upper quadrant. IMPRESSION: Enteric tube likely within the patient's intrathoracic stomach. The tube appears kinked. Electronically Signed   By: Constance Holster M.D.   On: 10/22/2018 18:18   Dg Abd Portable 1v  Result Date: 10/22/2018 CLINICAL DATA:  Patient with history of constipation. EXAM: PORTABLE ABDOMEN - 1 VIEW COMPARISON:  Abdominal radiograph 10/21/2018. FINDINGS: Stool within the cecum and ascending colon. No distended loops of bowel are identified although there is a paucity of small bowel gas. Lumbar spine degenerative changes. Pelvic phleboliths. IMPRESSION: Stool within the cecum and ascending colon. Paucity of small bowel gas. Electronically Signed   By: Lovey Newcomer M.D.   On: 10/22/2018 10:18   Dg Abd Portable 1v  Result Date: 10/21/2018 CLINICAL DATA:  Evaluate for small bowel obstruction. EXAM: PORTABLE ABDOMEN - 1 VIEW COMPARISON:  October 20, 2018 FINDINGS: No dilated loops of  small bowel identified on today's study. There is a paucity of small bowel gas somewhat limiting evaluation. No other abnormalities identified. IMPRESSION: No small bowel dilatation identified on today's study. There is a paucity of small bowel gas. 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, 01/2017, 02/2017, & 11/2017) - Prior history of open abdominal surgery in 2000 & 2001 for esophageal cancer s/p gastric resection -CT abdomen and pelvis showed a moderate to high-grade small bowel obstruction with a transition point in the mid abdomen where there is significant swirling of the mesenteric vessels and small bowel - NGT placed yesterday, appears kinked on film but has gotten > 1 L drainage out - film yesterday with stool in cecum and R colon - repeat film this AM - will discuss whether small bowel protocol appropriate for this patient with MD - goal of K 4.0 and Mg 2.0 with sbo or ileus    ID -No current abx VTE -SCDs FEN -IVF, NPO  LOS: 4 days    Brigid Re , [facility] 10/23/2018, 8:44 AM Pager: [phone] Consults: [phone]

## 2018-10-23 NOTE — Progress Notes (Addendum)
PROGRESS NOTE    Oscar [name]   VHQ:[id number]  DOB: 1932-11-20  DOA: 10/19/2018 PCP: Deland Pretty, MD   Brief Narrative:  Oscar [name] is a 85 y.o. male with medical history significant of hypertension, hyperlipidemia, diet-controlled diabetes, GERD, gout, BPH, esophageal cancer (surgery 2001), small bowel obstruction, tobacco abuse, obesity, OSA on CPAP, who presents with abdominal pain. CT abd/pelvis suggestive of high grade small bowel obstruction with transition point in the mid abdomen where there is significant swirling of the mesenteric vessels and small bowel  Admitted for SBO. General surgery was consulted as well.    Subjective: Abdominal pain improved and vomiting stopped after NG placed yesterday.    Assessment & Plan:   Principal Problem:   SBO (small bowel obstruction) - CT abd/pelvis> high grade SBO with transition point in mid abdomen  - repeat Xray  > No small bowel dilatation  - general surgery assisting with management - he finally agreed to an NG tube yesterday- it is still draining quite a bit- he is not passing gas - cont IVF while NPO    Active Problems:  AKI - Cr 1.38 - basline ~ 0.88- cont IVF- holding HCTZ and Losartan - Cr improved  Leukocytosis - due to above? - has improved  HTN - changed Atenolol and Losartan/HCTZ to Lopressor IV  BPH - cont Proscar   Hypokalemia/ hypomagnesemia - replace and follow    Time spent in minutes: 35 DVT prophylaxis: SCDs Code Status: Full code Family Communication:  Disposition Plan: home when stable Consultants:   General sugery Procedures:   none Antimicrobials:  Anti-infectives (From admission, onward)   None       Objective: Vitals:   10/22/18 2012 10/23/18 0500 10/23/18 1236 10/23/18 1332  BP: (!) 175/72 (!) 155/66 (!) 162/55 (!) 172/76  Pulse: 75 68 (!) 59 66  Resp: 18 18  13   Temp: 98.6 F (37 C) 98 F (36.7 C)  (!) 97.4 F (36.3 C)  TempSrc: Oral Oral  Oral   SpO2: 97% 95%  97%  Weight:      Height:        Intake/Output Summary (Last 24 hours) at 10/23/2018 1428 Last data filed at 10/23/2018 1343 Gross per 24 hour  Intake 936.42 ml  Output 2001 ml  Net -1064.58 ml   Filed Weights   10/19/18 0524  Weight: 83.7 kg    Examination:   General exam: Appears comfortable  HEENT: PERRLA, oral mucosa moist, no sclera icterus or thrush Respiratory system: Clear to auscultation. Respiratory effort normal. Cardiovascular system: S1 & S2 heard,  No murmurs  Gastrointestinal system: Abdomen soft,  Tender in mid abdomen, nondistended. No  bowel sounds   Central nervous system: Alert and oriented. No focal neurological deficits. Extremities: No cyanosis, clubbing or edema Skin: No rashes or ulcers Psychiatry:  Mood & affect appropriate.    Data Reviewed: I have personally reviewed following labs and imaging studies  CBC: Recent Labs  Lab 10/19/18 0128 10/19/18 0551 10/20/18 0820 10/22/18 1019  WBC 15.8* 12.5* 10.2 8.5  NEUTROABS 13.1*  --   --   --   HGB 14.1 14.6 16.1 12.4*  HCT 42.5 44.3 48.4 38.4*  MCV 96.2 96.3 96.8 97.7  PLT 292 287 286 413   Basic Metabolic Panel: Recent Labs  Lab 10/19/18 0551 10/20/18 0820 10/21/18 0308 10/22/18 1019 10/23/18 0318  NA 138 140 139 141 140  K 5.0 4.4 4.1 3.4* 3.7  CL 101 101 105  107 107  CO2 26 25 24 25 22   GLUCOSE 132* 159* 143* 92 75  BUN 18 18 20 17 15   CREATININE 1.34* 1.22 1.10 0.99 0.96  CALCIUM 9.5 9.5 9.1 8.7* 8.7*  MG  --   --   --   --  1.5*   GFR: Estimated Creatinine Clearance: 65.9 mL/min (by C-G formula based on SCr of 0.96 mg/dL). Liver Function Tests: Recent Labs  Lab 10/19/18 0128  AST 21  ALT 13  ALKPHOS 74  BILITOT 0.9  PROT 7.7  ALBUMIN 4.6   Recent Labs  Lab 10/19/18 0128  LIPASE 25   No results for input(s): AMMONIA in the last 168 hours. Coagulation Profile: Recent Labs  Lab 10/19/18 0551  INR 1.1   Cardiac Enzymes: No results for  input(s): CKTOTAL, CKMB, CKMBINDEX, TROPONINI in the last 168 hours. BNP (last 3 results) No results for input(s): PROBNP in the last 8760 hours. HbA1C: No results for input(s): HGBA1C in the last 72 hours. CBG: Recent Labs  Lab 10/20/18 0733 10/21/18 0806 10/22/18 0800 10/23/18 0735 10/23/18 0802  GLUCAP 162* 116* 80 62* 110*   Lipid Profile: No results for input(s): CHOL, HDL, LDLCALC, TRIG, CHOLHDL, LDLDIRECT in the last 72 hours. Thyroid Function Tests: No results for input(s): TSH, T4TOTAL, FREET4, T3FREE, THYROIDAB in the last 72 hours. Anemia Panel: No results for input(s): VITAMINB12, FOLATE, FERRITIN, TIBC, IRON, RETICCTPCT in the last 72 hours. Urine analysis:    Component Value Date/Time   COLORURINE YELLOW 10/19/2018 0950   APPEARANCEUR CLEAR 10/19/2018 0950   LABSPEC >1.046 (H) 10/19/2018 0950   PHURINE 5.0 10/19/2018 0950   GLUCOSEU NEGATIVE 10/19/2018 0950   HGBUR MODERATE (A) 10/19/2018 0950   BILIRUBINUR NEGATIVE 10/19/2018 0950   KETONESUR NEGATIVE 10/19/2018 0950   PROTEINUR NEGATIVE 10/19/2018 0950   UROBILINOGEN 0.2 09/12/2014 1816   NITRITE NEGATIVE 10/19/2018 0950   LEUKOCYTESUR NEGATIVE 10/19/2018 0950   Sepsis Labs: @LABRCNTIP (procalcitonin:4,lacticidven:4) ) Recent Results (from the past 240 hour(s))  Novel Coronavirus,NAA,(SEND-OUT TO REF LAB - TAT 24-48 hrs); Hosp Order     Status: None   Collection Time: 10/19/18  4:13 AM   Specimen: Nasopharyngeal Swab; Respiratory  Result Value Ref Range Status   SARS-CoV-2, NAA NOT DETECTED NOT DETECTED Final    Comment: (NOTE) This test was developed and its performance characteristics determined by [employer]. This test has not been FDA cleared or approved. This test has been authorized by FDA under an Emergency Use Authorization (EUA). This test is only authorized for the duration of time the declaration that circumstances exist justifying the authorization of the emergency use of in  vitro diagnostic tests for detection of SARS-CoV-2 virus and/or diagnosis of COVID-19 infection under section 564(b)(1) of the Act, 21 U.S.C. 606TKZ-6(W)(1), unless the authorization is terminated or revoked sooner. When diagnostic testing is negative, the possibility of a false negative result should be considered in the context of a patient's recent exposures and the presence of clinical signs and symptoms consistent with COVID-19. An individual without symptoms of COVID-19 and who is not shedding SARS-CoV-2 virus would expect to have a negative (not detected) result in this assay. Performed  At: [facility] [address] [place], [place] [id number] Rush Farmer MD UK:[id number]    Flippin  Final    Comment: Performed at [facility], [place] [address] [place] [employer], [place] [address]         Radiology Studies: Dg Abd 1 View  Result  Date: 10/22/2018 CLINICAL DATA:  Enteric tube placement EXAM: ABDOMEN - 1 VIEW COMPARISON:  Abdominal radiograph dated 10/22/2018, T is piece should computer FINDINGS: The enteric tube projects over the heart and is likely within the patient's intrathoracic stomach. The tube appears kinked. The visualized bowel gas pattern is nonspecific. Multiple surgical clips project over the left upper quadrant. IMPRESSION: Enteric tube likely within the patient's intrathoracic stomach. The tube appears kinked. Electronically Signed   By: Constance Holster M.D.   On: 10/22/2018 18:18   Dg Abd Portable 1v  Result Date: 10/22/2018 CLINICAL DATA:  Patient with history of constipation. EXAM: PORTABLE ABDOMEN - 1 VIEW COMPARISON:  Abdominal radiograph 10/21/2018. FINDINGS: Stool within the cecum and ascending colon. No distended loops of bowel are identified although there is a paucity of small bowel gas. Lumbar spine degenerative changes. Pelvic phleboliths. IMPRESSION: Stool within the cecum and ascending colon. Paucity of small bowel  gas. Electronically Signed   By: Lovey Newcomer M.D.   On: 10/22/2018 10:18      Scheduled Meds: . finasteride  5 mg Oral Daily  . metoprolol tartrate  5 mg Intravenous Q6H   Continuous Infusions: . dextrose 5 % and 0.9 % NaCl with KCl 20 mEq/L 100 mL/hr at 10/23/18 1024  . famotidine (PEPCID) IV 20 mg (10/23/18 1027)     LOS: 4 days      Debbe Odea, MD [facility] Pager: [email or web address] Password [facility] 10/23/2018, 2:28 PM

## 2018-10-24 ENCOUNTER — Other Ambulatory Visit: Payer: Self-pay

## 2018-10-24 ENCOUNTER — Inpatient Hospital Stay (HOSPITAL_COMMUNITY): Payer: Medicare Other

## 2018-10-24 LAB — BASIC METABOLIC PANEL
Anion gap: 7 (ref 5–15)
BUN: 8 mg/dL (ref 8–23)
CO2: 25 mmol/L (ref 22–32)
Calcium: 8.4 mg/dL — ABNORMAL LOW (ref 8.9–10.3)
Chloride: 107 mmol/L (ref 98–111)
Creatinine, Ser: 0.89 mg/dL (ref 0.61–1.24)
GFR calc Af Amer: >60 mL/min (ref >60)
GFR calc non Af Amer: >60 mL/min (ref >60)
Glucose, Bld: 137 mg/dL — ABNORMAL HIGH (ref 70–99)
Potassium: 3.3 mmol/L — ABNORMAL LOW (ref 3.5–5.1)
Sodium: 139 mmol/L (ref 135–145)

## 2018-10-24 LAB — MAGNESIUM: Magnesium: 1.6 mg/dL — ABNORMAL LOW (ref 1.7–2.4)

## 2018-10-24 MED ORDER — BOOST / RESOURCE BREEZE PO LIQD CUSTOM
1.0000 | Freq: Three times a day (TID) | ORAL | Status: DC
  Administered 2018-10-24: 1 via ORAL

## 2018-10-24 MED ORDER — CARVEDILOL 3.125 MG PO TABS
3.1250 mg | ORAL_TABLET | Freq: Two times a day (BID) | ORAL | Status: DC
  Administered 2018-10-24 – 2018-10-25 (×2): 3.125 mg via ORAL
  Filled 2018-10-24 (×3): qty 1

## 2018-10-24 MED ORDER — POTASSIUM CHLORIDE 10 MEQ/100ML IV SOLN
10.0000 meq | INTRAVENOUS | Status: AC
  Administered 2018-10-24 (×6): 10 meq via INTRAVENOUS
  Filled 2018-10-24 (×6): qty 100

## 2018-10-24 MED ORDER — MAGNESIUM SULFATE 2 GM/50ML IV SOLN
2.0000 g | Freq: Once | INTRAVENOUS | Status: AC
  Administered 2018-10-24: 11:00:00 2 g via INTRAVENOUS
  Filled 2018-10-24: qty 50

## 2018-10-24 MED ORDER — HYDRALAZINE HCL 25 MG PO TABS
25.0000 mg | ORAL_TABLET | Freq: Four times a day (QID) | ORAL | Status: DC
  Administered 2018-10-24 – 2018-10-25 (×5): 25 mg via ORAL
  Filled 2018-10-24 (×4): qty 1

## 2018-10-24 NOTE — Plan of Care (Signed)
  Problem: Activity: Goal: Risk for activity intolerance will decrease Outcome: Progressing   Problem: Nutrition: Goal: Adequate nutrition will be maintained Outcome: Progressing   Problem: Pain Managment: Goal: General experience of comfort will improve Outcome: Progressing   Problem: Safety: Goal: Ability to remain free from injury will improve Outcome: Progressing   

## 2018-10-24 NOTE — Progress Notes (Signed)
Pt refusing CPAP.  RT will continue to monitor.  

## 2018-10-24 NOTE — Care Management Important Message (Signed)
Important Message  Patient Details  Name: Oscar [name] MRN: [id number] Date of Birth: [birth year]   Medicare Important Message Given:  Yes    Memory Argue 10/24/2018, 1:43 PM

## 2018-10-24 NOTE — Progress Notes (Signed)
I was in the room when patient spoke with both wife and son to check in and provide an update on his status.

## 2018-10-24 NOTE — Progress Notes (Signed)
NG clamped and patient advanced to clears. Patient requested jello and gingerale. Will continue to monitor.

## 2018-10-24 NOTE — Progress Notes (Signed)
[facility] Progress Note     Subjective: CC: sbo Patient feels like he has a tingling sensation in abdomen this AM, thinks it is more of a hunger sensation. He has had several BM overnight and reports all were fairly large. Denies nausea this AM.   Objective: Vital signs in last 24 hours: Temp:  [97.4 F (36.3 C)-98.5 F (36.9 C)] 98.3 F (36.8 C) (06/17 0547) Pulse Rate:  [59-66] 62 (06/17 0547) Resp:  [13-16] 14 (06/17 0547) BP: (161-185)/(55-76) 170/72 (06/17 0547) SpO2:  [97 %-100 %] 97 % (06/17 0547) Last BM Date: 10/23/18  Intake/Output from previous day: 06/16 0701 - 06/17 0700 In: 1688.8 [I.V.:1388.8; IV Piggyback:300] Out: 6269 [Urine:350; Emesis/NG output:800; Stool:1] Intake/Output this shift: No intake/output data recorded.  PE: Gen: Alert, NAD, pleasant Card: Regular rate and rhythm Pulm: Normal effort, clear to auscultation bilaterally Abd: Soft, non-tender,non-distended, bowel soundshypoactive,midline scar, NGT with bilious output Skin: warm and dry, no rashes  Psych: A&Ox3   Lab Results:  Recent Labs    10/22/18 1019  WBC 8.5  HGB 12.4*  HCT 38.4*  PLT 266   BMET Recent Labs    10/23/18 0318 10/24/18 0328  NA 140 139  K 3.7 3.3*  CL 107 107  CO2 22 25  GLUCOSE 75 137*  BUN 15 8  CREATININE 0.96 0.89  CALCIUM 8.7* 8.4*   PT/INR No results for input(s): LABPROT, INR in the last 72 hours. CMP     Component Value Date/Time   NA 139 10/24/2018 0328   K 3.3 (L) 10/24/2018 0328   CL 107 10/24/2018 0328   CO2 25 10/24/2018 0328   GLUCOSE 137 (H) 10/24/2018 0328   BUN 8 10/24/2018 0328   CREATININE 0.89 10/24/2018 0328   CREATININE 1.13 12/07/2012 1147   CALCIUM 8.4 (L) 10/24/2018 0328   PROT 7.7 10/19/2018 0128   ALBUMIN 4.6 10/19/2018 0128   AST 21 10/19/2018 0128   ALT 13 10/19/2018 0128   ALKPHOS 74 10/19/2018 0128   BILITOT 0.9 10/19/2018 0128   GFRNONAA >60 10/24/2018 0328   GFRAA >60 10/24/2018 0328    Lipase     Component Value Date/Time   LIPASE 25 10/19/2018 0128       Studies/Results: Dg Abd 1 View  Result Date: 10/22/2018 CLINICAL DATA:  Enteric tube placement EXAM: ABDOMEN - 1 VIEW COMPARISON:  Abdominal radiograph dated 10/22/2018, T is piece should computer FINDINGS: The enteric tube projects over the heart and is likely within the patient's intrathoracic stomach. The tube appears kinked. The visualized bowel gas pattern is nonspecific. Multiple surgical clips project over the left upper quadrant. IMPRESSION: Enteric tube likely within the patient's intrathoracic stomach. The tube appears kinked. Electronically Signed   By: Constance Holster M.D.   On: 10/22/2018 18:18   Dg Abd Portable 1v-small Bowel Obstruction Protocol-initial, 8 Hr Delay  Result Date: 10/23/2018 CLINICAL DATA:  Small bowel protocol. 8 hour delay. EXAM: PORTABLE ABDOMEN - 1 VIEW COMPARISON:  Radiograph yesterday. CT 10/19/2018 FINDINGS: Administered enteric contrast is seen in the ascending, transverse, descending and rectosigmoid colon. Contrast also opacifies the appendix. Small bowel dilatation on prior CT has improved with mild residual gaseous distention on the left at 3.3 cm. IMPRESSION: Administered enteric contrast throughout the colon. Decreased small bowel distention from prior CT. Electronically Signed   By: Keith Rake M.D.   On: 10/23/2018 21:58   Dg Abd Portable 1v  Result Date: 10/22/2018 CLINICAL DATA:  Patient with history of  constipation. EXAM: PORTABLE ABDOMEN - 1 VIEW COMPARISON:  Abdominal radiograph 10/21/2018. FINDINGS: Stool within the cecum and ascending colon. No distended loops of bowel are identified although there is a paucity of small bowel gas. Lumbar spine degenerative changes. Pelvic phleboliths. IMPRESSION: Stool within the cecum and ascending colon. Paucity of small bowel gas. Electronically Signed   By: Lovey Newcomer M.D.   On: 10/22/2018 10:18     Anti-infectives: Anti-infectives (From admission, onward)   None       Assessment/Plan HTN HLD DM GERD OSA Tobacco abuse AKI (baseline 0.88) - 0.96 today Hypomagnesemia - 1.6 this AM Hypokalemia - K 3.3 this AM  SBO - Hx of recurrent SBO (10/2016, 01/2017, 02/2017, & 11/2017) - Prior history of open abdominal surgery in 2000 & 2001 for esophageal cancer s/p gastric resection -CT abdomen and pelvis showed a moderate to high-grade small bowel obstruction with a transition point in the mid abdomen where there is significant swirling of the mesenteric vessels and small bowel - film overnight with contrast seen at rectum  - patient with several BM yesterday - clamp NGT and allow CLD, if patient tolerates this today can pull NGT this afternoon - MOBILIZE! - goal of K 4.0 and Mg 2.0 with sbo or ileus    ID -Nocurrent abx VTE -SCDs FEN -IVF, clamp NGT and allow CLD; replacing MG and K  LOS: 5 days    Brigid Re , [facility] 10/24/2018, 8:54 AM Pager: [phone] Consults: [phone]

## 2018-10-24 NOTE — Progress Notes (Signed)
Pt. Refused cpap. RT informed pt. To notify if he changes his mind. 

## 2018-10-24 NOTE — Progress Notes (Addendum)
Nutrition Follow-up  RD working remotely.  DOCUMENTATION CODES:   Not applicable  INTERVENTION:   -Boost Breeze po TID, each supplement provides 250 kcal and 9 grams of protein -RD will follow for diet advancement and adjust supplement as appropriate -If prolonged NPO/CLD status is anticipated, consider initiation of nutrition support  NUTRITION DIAGNOSIS:   Inadequate oral intake related to altered GI function as evidenced by NPO status.  Ongoing  GOAL:   Patient will meet greater than or equal to 90% of their needs  Unmet  MONITOR:   Diet advancement, Labs, Weight trends, Skin, I & O's  REASON FOR ASSESSMENT:   Malnutrition Screening Tool    ASSESSMENT:   Oscar [name] is a 85 y.o. male with medical history significant of hypertension, hyperlipidemia, diet-controlled diabetes, GERD, gout, BPH, esophageal cancer (surgery 2001), small bowel obstruction, tobacco abuse, obesity, OSA on CPAP, who presents with abdominal pain.  6/15- NGT placed  Reviewed I/O's: +538 ml x 24 hours and +1.8 L since admission  UOP: 350 ml x 24 hours  NGT output: 800 ml x 24 hours  Per MD notes, abdominal pain improved and vomiting stopped when NGT was placed (pt had been refusing NGT placement earlier this admission). Per general surgery notes, plan to avoid surgery if possible.   Pt remains NPO (5 days). Pt may benefit from initiation of nutrition support if prolonged NPO status is anticipated.   Medications reviewed and include dextrose 5% and 0.9%NcCl with KCl 20 mEq/L.  ADDENDUM: NGT now clamped and pt advanced to a clear liquid diet.   Labs reviewed: Mg: 1.5 (on IV supplementation), CBGS: 62-110  Diet Order:   Diet Order            Diet NPO time specified Except for: Sips with Meds, Ice Chips, Other (See Comments)  Diet effective now              EDUCATION NEEDS:   No education needs have been identified at this time  Skin:  Skin Assessment: Reviewed RN  Assessment  Last BM:  10/18/18  Height:   Ht Readings from Last 1 Encounters:  10/19/18 6\' 1"  (1.854 m)    Weight:   Wt Readings from Last 1 Encounters:  10/19/18 83.7 kg    Ideal Body Weight:  83.6 kg  BMI:  Body mass index is 24.35 kg/m.  Estimated Nutritional Needs:   Kcal:  2100-2300  Protein:  110-125 grams  Fluid:  > 2.1 L    Gurney Balthazor A. Jimmye Norman, RD, LDN, [place] Registered Dietitian II Certified Diabetes Care and Education Specialist Pager: [phone] After hours Pager: [phone]

## 2018-10-24 NOTE — Plan of Care (Signed)
  Problem: Clinical Measurements: Goal: Ability to maintain clinical measurements within normal limits will improve Outcome: Progressing Goal: Will remain free from infection Outcome: Progressing   Problem: Coping: Goal: Level of anxiety will decrease Outcome: Progressing   Problem: Pain Managment: Goal: General experience of comfort will improve Outcome: Progressing   Problem: Safety: Goal: Ability to remain free from injury will improve Outcome: Progressing   

## 2018-10-25 LAB — BASIC METABOLIC PANEL
Anion gap: 6 (ref 5–15)
BUN: 5 mg/dL — ABNORMAL LOW (ref 8–23)
CO2: 26 mmol/L (ref 22–32)
Calcium: 8.4 mg/dL — ABNORMAL LOW (ref 8.9–10.3)
Chloride: 105 mmol/L (ref 98–111)
Creatinine, Ser: 0.89 mg/dL (ref 0.61–1.24)
GFR calc Af Amer: >60 mL/min (ref >60)
GFR calc non Af Amer: >60 mL/min (ref >60)
Glucose, Bld: 138 mg/dL — ABNORMAL HIGH (ref 70–99)
Potassium: 4.7 mmol/L (ref 3.5–5.1)
Sodium: 137 mmol/L (ref 135–145)

## 2018-10-25 LAB — MAGNESIUM: Magnesium: 1.6 mg/dL — ABNORMAL LOW (ref 1.7–2.4)

## 2018-10-25 MED ORDER — CARVEDILOL 6.25 MG PO TABS: 6.2500 mg | ORAL_TABLET | Freq: Two times a day (BID) | ORAL | Status: DC

## 2018-10-25 MED ORDER — ENSURE ENLIVE PO LIQD
237.0000 mL | Freq: Two times a day (BID) | ORAL | Status: DC
  Administered 2018-10-25 (×2): 237 mL via ORAL

## 2018-10-25 MED ORDER — MAGNESIUM CHLORIDE 64 MG PO TBEC: 1.0000 | DELAYED_RELEASE_TABLET | Freq: Every day | ORAL | Status: DC

## 2018-10-25 MED ORDER — FINASTERIDE 5 MG PO TABS: 5.0000 mg | ORAL_TABLET | Freq: Every day | ORAL | 0 refills | Status: AC

## 2018-10-25 MED ORDER — CARVEDILOL 6.25 MG PO TABS: 6.2500 mg | ORAL_TABLET | Freq: Two times a day (BID) | ORAL | 0 refills | Status: DC

## 2018-10-25 MED ORDER — ENSURE ENLIVE PO LIQD: 237.0000 mL | Freq: Two times a day (BID) | ORAL | 12 refills | Status: DC

## 2018-10-25 MED ORDER — MAGNESIUM CHLORIDE 64 MG PO TBEC
1.0000 | DELAYED_RELEASE_TABLET | Freq: Every day | ORAL | Status: DC
  Administered 2018-10-25: 13:00:00 64 mg via ORAL
  Filled 2018-10-25: qty 1

## 2018-10-25 MED ORDER — FAMOTIDINE 20 MG PO TABS: 20.0000 mg | ORAL_TABLET | Freq: Two times a day (BID) | ORAL | Status: DC

## 2018-10-25 NOTE — Progress Notes (Signed)
[facility] Progress Note     Subjective: CC: sbo Patient tolerated CLD overnight without nausea or vomiting. Still having bowel function. Denies abdominal pain. Reports that he really wants to go home later today.  Objective: Vital signs in last 24 hours: Temp:  [98.1 F (36.7 C)-98.8 F (37.1 C)] 98.7 F (37.1 C) (06/18 0511) Pulse Rate:  [70-80] 80 (06/18 0511) Resp:  [17] 17 (06/18 0511) BP: (169-183)/(80-90) 183/90 (06/18 0511) SpO2:  [99 %-100 %] 99 % (06/18 0511) Last BM Date: 10/24/18  Intake/Output from previous day: 06/17 0701 - 06/18 0700 In: 1809.6 [P.O.:222; I.V.:1116.7; IV Piggyback:470.9] Out: 100 [Urine:100] Intake/Output this shift: Total I/O In: -  Out: 400 [Urine:400]  PE: Gen: Alert, NAD, pleasant Card: Regular rate and rhythm Pulm: Normal effort, clear to auscultation bilaterally Abd: Soft, non-tender,non-distended, +BS,midline scar Skin: warm and dry, no rashes  Psych: A&Ox3  Lab Results:  Recent Labs    10/22/18 1019  WBC 8.5  HGB 12.4*  HCT 38.4*  PLT 266   BMET Recent Labs    10/24/18 0328 10/25/18 0251  NA 139 137  K 3.3* 4.7  CL 107 105  CO2 25 26  GLUCOSE 137* 138*  BUN 8 5*  CREATININE 0.89 0.89  CALCIUM 8.4* 8.4*   PT/INR No results for input(s): LABPROT, INR in the last 72 hours. CMP     Component Value Date/Time   NA 137 10/25/2018 0251   K 4.7 10/25/2018 0251   CL 105 10/25/2018 0251   CO2 26 10/25/2018 0251   GLUCOSE 138 (H) 10/25/2018 0251   BUN 5 (L) 10/25/2018 0251   CREATININE 0.89 10/25/2018 0251   CREATININE 1.13 12/07/2012 1147   CALCIUM 8.4 (L) 10/25/2018 0251   PROT 7.7 10/19/2018 0128   ALBUMIN 4.6 10/19/2018 0128   AST 21 10/19/2018 0128   ALT 13 10/19/2018 0128   ALKPHOS 74 10/19/2018 0128   BILITOT 0.9 10/19/2018 0128   GFRNONAA >60 10/25/2018 0251   GFRAA >60 10/25/2018 0251   Lipase     Component Value Date/Time   LIPASE 25 10/19/2018 0128        Studies/Results: Dg Abd Portable 1v-small Bowel Obstruction Protocol-24 Hr Delay  Result Date: 10/24/2018 CLINICAL DATA:  Small-bowel obstruction, 24 hour delayed film EXAM: PORTABLE ABDOMEN - 1 VIEW COMPARISON:  10/23/2018 FINDINGS: Diffuse contrast throughout colon to rectum. A few dilated loops of small bowel are still seen within the abdomen, faintly opacified by contrast. Sigmoid and descending colonic diverticula noted. Degenerative disc and facet disease changes at visualized lumbar spine. IMPRESSION: Persistent dilatation of small bowel loops in the mid abdomen though a significant portion of the contrast material has passed into the colon to the rectum. Distal colonic diverticulosis. Electronically Signed   By: Lavonia Dana M.D.   On: 10/24/2018 15:29   Dg Abd Portable 1v-small Bowel Obstruction Protocol-initial, 8 Hr Delay  Result Date: 10/23/2018 CLINICAL DATA:  Small bowel protocol. 8 hour delay. EXAM: PORTABLE ABDOMEN - 1 VIEW COMPARISON:  Radiograph yesterday. CT 10/19/2018 FINDINGS: Administered enteric contrast is seen in the ascending, transverse, descending and rectosigmoid colon. Contrast also opacifies the appendix. Small bowel dilatation on prior CT has improved with mild residual gaseous distention on the left at 3.3 cm. IMPRESSION: Administered enteric contrast throughout the colon. Decreased small bowel distention from prior CT. Electronically Signed   By: Keith Rake M.D.   On: 10/23/2018 21:58    Anti-infectives: Anti-infectives (From admission, onward)   None  Assessment/Plan HTN HLD DM GERD OSA Tobacco abuse AKI (baseline 0.88) -0.89 today Hypomagnesemia - 1.6 this AM, replace Hypokalemia - K 4.7 this AM  SBO - Hx of recurrent SBO (10/2016, 01/2017, 02/2017, & 11/2017) - Prior history of open abdominal surgery in 2000 & 2001 for esophageal cancer s/p gastric resection -CT abdomen and pelvis showed a moderate to high-grade small bowel  obstruction with a transition point in the mid abdomen where there is significant swirling of the mesenteric vessels and small bowel - tolerating CLD and having bowel function  - MOBILIZE! - goal of K 4.0 and Mg 2.0 with sbo or ileus   ID -Nocurrent abx VTE -SCDs FEN -FLD, can ADAT to soft  Ok to advance as tolerated to soft diet. Ok for discharge home on soft diet from a general surgery standpoint. Having bowel function.   LOS: 6 days    Brigid Re , [facility] 10/25/2018, 9:29 AM Pager: [phone] Consults: [phone]

## 2018-11-26 NOTE — Discharge Summary (Signed)
Physician Discharge Summary  Patient ID: Oscar [name] MRN: [id number] DOB/AGE: [birth year] 85 y.o.  Admit date: 10/19/2018 Discharge date: 10/25/2018  Admission Diagnoses:  Discharge Diagnoses:  Principal Problem:   SBO (small bowel obstruction) ([place]) Active Problems:   GERD   PERSONAL HISTORY MALIGNANT NEOPLASM ESOPHAGUS   HTN (hypertension)   Leukocytosis   OSA (obstructive sleep apnea)   Tobacco abuse   BPH (benign prostatic hyperplasia)   AKI (acute kidney injury) ([place])   Discharged Condition: stable  Hospital Course: Oscar [name] is an 85 year old male with past medical history significant for hypertension, hyperlipidemia, diet-controlled diabetes, GERD, gout, BPH, esophageal cancer (surgery 2001), small bowel obstruction, tobacco abuse, obesity, OSA on CPAP, who presents with abdominal pain.  CT abd/pelvis suggestive of high grade small bowel obstruction with transition point in the mid abdomen where there is significant swirling of the mesenteric vessels and small bowel.  Patient was admitted for further assessment and management.  General surgery team was consulted to assist with patient's management.  Small bowel obstruction has resolved.  General surgery team has cleared patient for discharge.  Patient has tolerated her oral load.  SBO (small bowel obstruction) - CT abd/pelvi revealed  high grade SBO with transition point in mid abdomen -Patient was admitted and managed supportively. -General surgery was consulted to assist with patient's management. -Small bowel obstruction has resolved. -Patient is tolerating orally. -Surgical team is cleared patient for discharge.  Acute kidney injury:  Likely prerenal.   Resolved.    Leukocytosis -Likely reactive.   -Resolved.    Hypertension:  -Continue to manage expectantly.  BPH - cont Proscar   Hypokalemia/ hypomagnesemia -Potassium was 4.7 prior to discharge.   -Magnesium was 1.6 prior to discharge.    -Continue to monitor on discharge.    Consults: general surgery  Significant Diagnostic Studies:    Discharge Exam: Blood pressure (!) 179/80, pulse 78, temperature 98.3 F (36.8 C), temperature source Oral, resp. rate 15, height 6\' 1"  (1.854 m), weight 83.7 kg, SpO2 100 %.   Disposition:   Discharge Instructions    Diet - low sodium heart healthy   Complete by: As directed    Increase activity slowly   Complete by: As directed      Allergies as of 10/25/2018      Reactions   Bee Venom Anaphylaxis   Tamsulosin Other (See Comments)   "Makes me feel weird"      Medication List    STOP taking these medications   atenolol 25 MG tablet Commonly known as: TENORMIN   finasteride 5 MG tablet Commonly known as: PROSCAR     TAKE these medications   alprostadil 1000 MCG pellet Commonly known as: MUSE 1,000 mcg by Transurethral route daily as needed for erectile dysfunction. use no more than 3 times per week   bisacodyl 5 MG EC tablet Commonly known as: DULCOLAX Take 5 mg by mouth at bedtime as needed for moderate constipation.   carvedilol 6.25 MG tablet Commonly known as: COREG Take 1 tablet (6.25 mg total) by mouth 2 (two) times daily with a meal for 30 days.   feeding supplement (ENSURE ENLIVE) Liqd Take 237 mLs by mouth 2 (two) times daily between meals.   HYDROcodone-acetaminophen 5-325 MG tablet Commonly known as: NORCO/VICODIN Take 1 tablet by mouth every 6 (six) hours as needed for moderate pain.   losartan-hydrochlorothiazide 100-12.5 MG tablet Commonly known as: HYZAAR Take 0.5 tablets by mouth daily.   omeprazole 20 MG capsule  Commonly known as: PRILOSEC Take 40 mg by mouth 2 (two) times daily before a meal.   polyethylene glycol 17 g packet Commonly known as: MIRALAX / GLYCOLAX Take 17-34 g by mouth daily.   vitamin C 1000 MG tablet Take 1,000 mg by mouth daily.     ASK your doctor about these medications   finasteride 5 MG tablet Commonly  known as: PROSCAR Take 1 tablet (5 mg total) by mouth daily for 30 days. Ask about: Should I take this medication?        SignedBonnell Public 11/26/2018, 2:48 AM

## 2019-01-18 DIAGNOSIS — G4733 Obstructive sleep apnea (adult) (pediatric): Secondary | ICD-10-CM | POA: Diagnosis not present

## 2019-02-28 DIAGNOSIS — R350 Frequency of micturition: Secondary | ICD-10-CM | POA: Diagnosis not present

## 2019-03-11 DIAGNOSIS — E7800 Pure hypercholesterolemia, unspecified: Secondary | ICD-10-CM | POA: Diagnosis not present

## 2019-03-11 DIAGNOSIS — E119 Type 2 diabetes mellitus without complications: Secondary | ICD-10-CM | POA: Diagnosis not present

## 2019-03-11 DIAGNOSIS — I10 Essential (primary) hypertension: Secondary | ICD-10-CM | POA: Diagnosis not present

## 2019-03-18 DIAGNOSIS — Z9289 Personal history of other medical treatment: Secondary | ICD-10-CM | POA: Diagnosis not present

## 2019-03-18 DIAGNOSIS — E118 Type 2 diabetes mellitus with unspecified complications: Secondary | ICD-10-CM | POA: Diagnosis not present

## 2019-03-18 DIAGNOSIS — Z23 Encounter for immunization: Secondary | ICD-10-CM | POA: Diagnosis not present

## 2019-03-18 DIAGNOSIS — Z0000 Encounter for general adult medical examination without abnormal findings: Secondary | ICD-10-CM | POA: Diagnosis not present

## 2019-03-18 DIAGNOSIS — E7800 Pure hypercholesterolemia, unspecified: Secondary | ICD-10-CM | POA: Diagnosis not present

## 2019-03-20 DIAGNOSIS — E119 Type 2 diabetes mellitus without complications: Secondary | ICD-10-CM | POA: Diagnosis not present

## 2019-03-20 DIAGNOSIS — Z961 Presence of intraocular lens: Secondary | ICD-10-CM | POA: Diagnosis not present

## 2019-03-20 DIAGNOSIS — H25812 Combined forms of age-related cataract, left eye: Secondary | ICD-10-CM | POA: Diagnosis not present

## 2019-03-20 DIAGNOSIS — H04123 Dry eye syndrome of bilateral lacrimal glands: Secondary | ICD-10-CM | POA: Diagnosis not present

## 2019-03-20 DIAGNOSIS — H40013 Open angle with borderline findings, low risk, bilateral: Secondary | ICD-10-CM | POA: Diagnosis not present

## 2019-04-08 DIAGNOSIS — M9904 Segmental and somatic dysfunction of sacral region: Secondary | ICD-10-CM | POA: Diagnosis not present

## 2019-04-08 DIAGNOSIS — M9905 Segmental and somatic dysfunction of pelvic region: Secondary | ICD-10-CM | POA: Diagnosis not present

## 2019-04-08 DIAGNOSIS — M9903 Segmental and somatic dysfunction of lumbar region: Secondary | ICD-10-CM | POA: Diagnosis not present

## 2019-04-08 DIAGNOSIS — M5136 Other intervertebral disc degeneration, lumbar region: Secondary | ICD-10-CM | POA: Diagnosis not present

## 2019-04-10 DIAGNOSIS — H918X3 Other specified hearing loss, bilateral: Secondary | ICD-10-CM | POA: Diagnosis not present

## 2019-04-10 DIAGNOSIS — H903 Sensorineural hearing loss, bilateral: Secondary | ICD-10-CM | POA: Diagnosis not present

## 2019-04-12 DIAGNOSIS — M545 Low back pain: Secondary | ICD-10-CM | POA: Diagnosis not present

## 2019-04-18 DIAGNOSIS — G4733 Obstructive sleep apnea (adult) (pediatric): Secondary | ICD-10-CM | POA: Diagnosis not present

## 2019-05-04 ENCOUNTER — Encounter (HOSPITAL_COMMUNITY): Payer: Self-pay

## 2019-05-04 ENCOUNTER — Inpatient Hospital Stay (HOSPITAL_COMMUNITY)
Admission: EM | Admit: 2019-05-04 | Discharge: 2019-05-10 | DRG: 390 | Disposition: A | Discharge status: Home / Self Care | Payer: Medicare Other | Attending: Internal Medicine | Admitting: Internal Medicine

## 2019-05-04 ENCOUNTER — Other Ambulatory Visit: Payer: Self-pay

## 2019-05-04 ENCOUNTER — Emergency Department (HOSPITAL_COMMUNITY): Payer: Medicare Other

## 2019-05-04 DIAGNOSIS — M109 Gout, unspecified: Secondary | ICD-10-CM | POA: Diagnosis not present

## 2019-05-04 DIAGNOSIS — Z91030 Bee allergy status: Secondary | ICD-10-CM

## 2019-05-04 DIAGNOSIS — E119 Type 2 diabetes mellitus without complications: Secondary | ICD-10-CM | POA: Diagnosis present

## 2019-05-04 DIAGNOSIS — M5126 Other intervertebral disc displacement, lumbar region: Secondary | ICD-10-CM | POA: Diagnosis present

## 2019-05-04 DIAGNOSIS — Z823 Family history of stroke: Secondary | ICD-10-CM

## 2019-05-04 DIAGNOSIS — Z8501 Personal history of malignant neoplasm of esophagus: Secondary | ICD-10-CM | POA: Diagnosis not present

## 2019-05-04 DIAGNOSIS — Z833 Family history of diabetes mellitus: Secondary | ICD-10-CM

## 2019-05-04 DIAGNOSIS — K56600 Partial intestinal obstruction, unspecified as to cause: Secondary | ICD-10-CM | POA: Diagnosis present

## 2019-05-04 DIAGNOSIS — Z888 Allergy status to other drugs, medicaments and biological substances status: Secondary | ICD-10-CM | POA: Diagnosis not present

## 2019-05-04 DIAGNOSIS — M1990 Unspecified osteoarthritis, unspecified site: Secondary | ICD-10-CM | POA: Diagnosis present

## 2019-05-04 DIAGNOSIS — E785 Hyperlipidemia, unspecified: Secondary | ICD-10-CM | POA: Diagnosis not present

## 2019-05-04 DIAGNOSIS — Z8249 Family history of ischemic heart disease and other diseases of the circulatory system: Secondary | ICD-10-CM

## 2019-05-04 DIAGNOSIS — Z03818 Encounter for observation for suspected exposure to other biological agents ruled out: Secondary | ICD-10-CM | POA: Diagnosis not present

## 2019-05-04 DIAGNOSIS — Z0189 Encounter for other specified special examinations: Secondary | ICD-10-CM

## 2019-05-04 DIAGNOSIS — R112 Nausea with vomiting, unspecified: Secondary | ICD-10-CM | POA: Diagnosis not present

## 2019-05-04 DIAGNOSIS — K565 Intestinal adhesions [bands], unspecified as to partial versus complete obstruction: Secondary | ICD-10-CM | POA: Diagnosis not present

## 2019-05-04 DIAGNOSIS — N400 Enlarged prostate without lower urinary tract symptoms: Secondary | ICD-10-CM | POA: Diagnosis not present

## 2019-05-04 DIAGNOSIS — G4733 Obstructive sleep apnea (adult) (pediatric): Secondary | ICD-10-CM | POA: Diagnosis not present

## 2019-05-04 DIAGNOSIS — F17290 Nicotine dependence, other tobacco product, uncomplicated: Secondary | ICD-10-CM | POA: Diagnosis present

## 2019-05-04 DIAGNOSIS — I440 Atrioventricular block, first degree: Secondary | ICD-10-CM | POA: Diagnosis not present

## 2019-05-04 DIAGNOSIS — K219 Gastro-esophageal reflux disease without esophagitis: Secondary | ICD-10-CM | POA: Diagnosis present

## 2019-05-04 DIAGNOSIS — Z20822 Contact with and (suspected) exposure to covid-19: Secondary | ICD-10-CM | POA: Diagnosis not present

## 2019-05-04 DIAGNOSIS — R3129 Other microscopic hematuria: Secondary | ICD-10-CM | POA: Diagnosis not present

## 2019-05-04 DIAGNOSIS — R109 Unspecified abdominal pain: Secondary | ICD-10-CM | POA: Diagnosis not present

## 2019-05-04 DIAGNOSIS — R0902 Hypoxemia: Secondary | ICD-10-CM | POA: Diagnosis not present

## 2019-05-04 DIAGNOSIS — R1033 Periumbilical pain: Secondary | ICD-10-CM | POA: Diagnosis not present

## 2019-05-04 DIAGNOSIS — Z743 Need for continuous supervision: Secondary | ICD-10-CM | POA: Diagnosis not present

## 2019-05-04 DIAGNOSIS — K5669 Other partial intestinal obstruction: Secondary | ICD-10-CM | POA: Diagnosis not present

## 2019-05-04 DIAGNOSIS — I10 Essential (primary) hypertension: Secondary | ICD-10-CM | POA: Diagnosis present

## 2019-05-04 DIAGNOSIS — K56609 Unspecified intestinal obstruction, unspecified as to partial versus complete obstruction: Secondary | ICD-10-CM | POA: Diagnosis present

## 2019-05-04 DIAGNOSIS — R52 Pain, unspecified: Secondary | ICD-10-CM | POA: Diagnosis not present

## 2019-05-04 LAB — CBC WITH DIFFERENTIAL/PLATELET
Abs Immature Granulocytes: 0.05 10*3/uL (ref 0.00–0.07)
Basophils Absolute: 0 10*3/uL (ref 0.0–0.1)
Basophils Relative: 0 %
Eosinophils Absolute: 0 10*3/uL (ref 0.0–0.5)
Eosinophils Relative: 0 %
HCT: 43 % (ref 39.0–52.0)
Hemoglobin: 14.4 g/dL (ref 13.0–17.0)
Immature Granulocytes: 0 %
Lymphocytes Relative: 7 %
Lymphs Abs: 0.9 10*3/uL (ref 0.7–4.0)
MCH: 32.1 pg (ref 26.0–34.0)
MCHC: 33.5 g/dL (ref 30.0–36.0)
MCV: 95.8 fL (ref 80.0–100.0)
Monocytes Absolute: 0.3 10*3/uL (ref 0.1–1.0)
Monocytes Relative: 3 %
Neutro Abs: 12 10*3/uL — ABNORMAL HIGH (ref 1.7–7.7)
Neutrophils Relative %: 90 %
Platelets: 315 10*3/uL (ref 150–400)
RBC: 4.49 MIL/uL (ref 4.22–5.81)
RDW: 11.6 % (ref 11.5–15.5)
WBC: 13.3 10*3/uL — ABNORMAL HIGH (ref 4.0–10.5)
nRBC: 0 % (ref 0.0–0.2)

## 2019-05-04 LAB — COMPREHENSIVE METABOLIC PANEL
ALT: 14 U/L (ref 0–44)
AST: 20 U/L (ref 15–41)
Albumin: 4.7 g/dL (ref 3.5–5.0)
Alkaline Phosphatase: 79 U/L (ref 38–126)
Anion gap: 13 (ref 5–15)
BUN: 19 mg/dL (ref 8–23)
CO2: 29 mmol/L (ref 22–32)
Calcium: 9.9 mg/dL (ref 8.9–10.3)
Chloride: 92 mmol/L — ABNORMAL LOW (ref 98–111)
Creatinine, Ser: 1.15 mg/dL (ref 0.61–1.24)
GFR calc Af Amer: >60 mL/min (ref >60)
GFR calc non Af Amer: 58 mL/min — ABNORMAL LOW (ref >60)
Glucose, Bld: 138 mg/dL — ABNORMAL HIGH (ref 70–99)
Potassium: 4.1 mmol/L (ref 3.5–5.1)
Sodium: 134 mmol/L — ABNORMAL LOW (ref 135–145)
Total Bilirubin: 1.1 mg/dL (ref 0.3–1.2)
Total Protein: 8.2 g/dL — ABNORMAL HIGH (ref 6.5–8.1)

## 2019-05-04 LAB — URINALYSIS, ROUTINE W REFLEX MICROSCOPIC
Bacteria, UA: NONE SEEN
Bilirubin Urine: NEGATIVE
Glucose, UA: NEGATIVE mg/dL
Ketones, ur: 20 mg/dL — AB
Leukocytes,Ua: NEGATIVE
Nitrite: NEGATIVE
Protein, ur: 100 mg/dL — AB
RBC / HPF: >50 RBC/hpf — ABNORMAL HIGH (ref 0–5)
Specific Gravity, Urine: 1.045 — ABNORMAL HIGH (ref 1.005–1.030)
pH: 7 (ref 5.0–8.0)

## 2019-05-04 LAB — LIPASE, BLOOD: Lipase: 36 U/L (ref 11–51)

## 2019-05-04 LAB — GLUCOSE, CAPILLARY: Glucose-Capillary: 98 mg/dL (ref 70–99)

## 2019-05-04 MED ORDER — MORPHINE SULFATE (PF) 2 MG/ML IV SOLN
2.0000 mg | INTRAVENOUS | Status: DC | PRN
  Administered 2019-05-04 – 2019-05-09 (×15): 2 mg via INTRAVENOUS
  Filled 2019-05-04 (×15): qty 1

## 2019-05-04 MED ORDER — SODIUM CHLORIDE 0.9 % IV SOLN: INTRAVENOUS | Status: DC

## 2019-05-04 MED ORDER — IOHEXOL 300 MG/ML  SOLN
100.0000 mL | Freq: Once | INTRAMUSCULAR | Status: AC | PRN
  Administered 2019-05-04: 18:00:00 100 mL via INTRAVENOUS

## 2019-05-04 MED ORDER — FAMOTIDINE IN NACL 20-0.9 MG/50ML-% IV SOLN
20.0000 mg | Freq: Two times a day (BID) | INTRAVENOUS | Status: DC
  Administered 2019-05-04 – 2019-05-09 (×11): 20 mg via INTRAVENOUS
  Filled 2019-05-04 (×12): qty 50

## 2019-05-04 MED ORDER — ONDANSETRON HCL 4 MG/2ML IJ SOLN
4.0000 mg | Freq: Four times a day (QID) | INTRAMUSCULAR | Status: DC | PRN
  Administered 2019-05-06 – 2019-05-08 (×5): 4 mg via INTRAVENOUS
  Filled 2019-05-04 (×5): qty 2

## 2019-05-04 MED ORDER — INSULIN ASPART 100 UNIT/ML ~~LOC~~ SOLN
0.0000 [IU] | SUBCUTANEOUS | Status: DC
  Administered 2019-05-06 – 2019-05-08 (×8): 1 [IU] via SUBCUTANEOUS
  Administered 2019-05-08: 2 [IU] via SUBCUTANEOUS
  Administered 2019-05-09: 1 [IU] via SUBCUTANEOUS
  Filled 2019-05-04: qty 0.09

## 2019-05-04 MED ORDER — HYDROMORPHONE HCL 1 MG/ML IJ SOLN
0.5000 mg | INTRAMUSCULAR | Status: DC | PRN
  Administered 2019-05-06 – 2019-05-08 (×5): 0.5 mg via INTRAVENOUS
  Filled 2019-05-04 (×6): qty 0.5

## 2019-05-04 MED ORDER — ACETAMINOPHEN 650 MG RE SUPP: 650.0000 mg | Freq: Four times a day (QID) | RECTAL | Status: DC | PRN

## 2019-05-04 MED ORDER — ONDANSETRON HCL 4 MG/2ML IJ SOLN
4.0000 mg | Freq: Once | INTRAMUSCULAR | Status: AC
  Administered 2019-05-04: 4 mg via INTRAVENOUS
  Filled 2019-05-04: qty 2

## 2019-05-04 MED ORDER — SODIUM CHLORIDE (PF) 0.9 % IJ SOLN
INTRAMUSCULAR | Status: AC
  Filled 2019-05-04: qty 50

## 2019-05-04 MED ORDER — HYDRALAZINE HCL 20 MG/ML IJ SOLN
5.0000 mg | Freq: Four times a day (QID) | INTRAMUSCULAR | Status: DC | PRN
  Administered 2019-05-08: 5 mg via INTRAVENOUS
  Filled 2019-05-04: qty 1

## 2019-05-04 MED ORDER — METOPROLOL TARTRATE 5 MG/5ML IV SOLN
2.5000 mg | Freq: Three times a day (TID) | INTRAVENOUS | Status: DC
  Administered 2019-05-04 – 2019-05-10 (×18): 2.5 mg via INTRAVENOUS
  Filled 2019-05-04 (×18): qty 5

## 2019-05-04 MED ORDER — ENOXAPARIN SODIUM 40 MG/0.4ML ~~LOC~~ SOLN
40.0000 mg | Freq: Every day | SUBCUTANEOUS | Status: DC
  Administered 2019-05-04 – 2019-05-09 (×6): 40 mg via SUBCUTANEOUS
  Filled 2019-05-04 (×6): qty 0.4

## 2019-05-04 MED ORDER — MORPHINE SULFATE (PF) 4 MG/ML IV SOLN
4.0000 mg | Freq: Once | INTRAVENOUS | Status: AC
  Administered 2019-05-04: 4 mg via INTRAVENOUS
  Filled 2019-05-04: qty 1

## 2019-05-04 MED ORDER — METOCLOPRAMIDE HCL 5 MG/ML IJ SOLN
5.0000 mg | Freq: Four times a day (QID) | INTRAMUSCULAR | Status: DC
  Administered 2019-05-04: 5 mg via INTRAVENOUS
  Filled 2019-05-04: qty 2

## 2019-05-04 MED ORDER — BISACODYL 10 MG RE SUPP
10.0000 mg | Freq: Once | RECTAL | Status: AC
  Administered 2019-05-04: 10 mg via RECTAL
  Filled 2019-05-04: qty 1

## 2019-05-04 MED ORDER — ACETAMINOPHEN 325 MG PO TABS
650.0000 mg | ORAL_TABLET | Freq: Four times a day (QID) | ORAL | Status: DC | PRN
  Administered 2019-05-10: 650 mg via ORAL
  Filled 2019-05-04: qty 2

## 2019-05-04 NOTE — Consult Note (Signed)
Reason for Consult:SBO Referring Physician: Dr. Jani Gravel  Oscar [name] is an 85 y.o. male.  HPI: This gentleman is known to our service.  Most recently, he was seen in June with a small bowel obstruction which resolved with conservative management.  He has had several previous small bowel obstructions.  He has a history of having had exploratory laparotomy with esophagectomy and partial gastrectomy for esophageal cancer in 2000 and 2001.  He presents again with cramping abdominal pain for almost a week.  It acutely worsened in the last 24 hours.  He had nausea and a small amount of emesis.  His last bowel movement was this morning.  He currently is feeling slightly better.  He underwent a CT scan of the abdomen and pelvis showing a partial small bowel obstruction.  He currently refuses a nasogastric tube but reports he may reconsider depending on how he feels.  Past Medical History:  Diagnosis Date  . Acid reflux   . Barrett's esophagus   . BPH (benign prostatic hyperplasia)   . Colon polyps   . DDD (degenerative disc disease)   . Diabetes mellitus    controlled with diet and exercise  . ED (erectile dysfunction)   . Esophageal cancer ([place])    tx'd surgery - abdominal & right posterior chest approach (2001) - [place], [place]  . First degree AV block   . Gout   . Hemorrhoids   . History of palpitations   . Hyperlipidemia   . Hypertension   . Lipoma   . Obesity   . Osteoarthritis   . Tinea versicolor   . Tinnitus     Past Surgical History:  Procedure Laterality Date  . bilateral olecranon bursal excisions    . Esophageal Cancer Surgery  2000, 2001  . GASTRIC RESECTION    . HEMORRHOID SURGERY    . NM MYOCAR PERF WALL MOTION  2011   dipyridamole myoview - stress images show medium in size, moderate in intensity perfusion defect in basal inferior & mid inferior walls with mild defect reversibility at rest, EF 64%  . TRANSTHORACIC ECHOCARDIOGRAM  2011   EF=>55%, mild mitral  annular calcif, mild calcif of MV apparatus, mild TR, normal RSVP, mild AV regurg, aortic root sclerosis/calcifiication  . VASECTOMY      Family History  Problem Relation Age of Onset  . Diabetes Mother   . CAD Mother   . Stroke Sister   . Colon cancer Neg Hx   . Esophageal cancer Neg Hx   . Rectal cancer Neg Hx   . Stomach cancer Neg Hx     Social History:  reports that he has been smoking cigars and pipe. He has smoked for the past 50.00 years. He has never used smokeless tobacco. He reports current alcohol use of about 7.0 - 14.0 standard drinks of alcohol per week. He reports that he does not use drugs.  Allergies:  Allergies  Allergen Reactions  . Bee Venom Anaphylaxis  . Tamsulosin Other (See Comments)    "Makes me feel weird"    Medications: I have reviewed the patient's current medications.  Results for orders placed or performed during the hospital encounter of 05/04/19 (from the past 48 hour(s))  CBC with Differential     Status: Abnormal   Collection Time: 05/04/19  4:33 PM  Result Value Ref Range   WBC 13.3 (H) 4.0 - 10.5 K/uL   RBC 4.49 4.22 - 5.81 MIL/uL   Hemoglobin 14.4 13.0 -  17.0 g/dL   HCT 43.0 39.0 - 52.0 %   MCV 95.8 80.0 - 100.0 fL   MCH 32.1 26.0 - 34.0 pg   MCHC 33.5 30.0 - 36.0 g/dL   RDW 11.6 11.5 - 15.5 %   Platelets 315 150 - 400 K/uL   nRBC 0.0 0.0 - 0.2 %   Neutrophils Relative % 90 %   Neutro Abs 12.0 (H) 1.7 - 7.7 K/uL   Lymphocytes Relative 7 %   Lymphs Abs 0.9 0.7 - 4.0 K/uL   Monocytes Relative 3 %   Monocytes Absolute 0.3 0.1 - 1.0 K/uL   Eosinophils Relative 0 %   Eosinophils Absolute 0.0 0.0 - 0.5 K/uL   Basophils Relative 0 %   Basophils Absolute 0.0 0.0 - 0.1 K/uL   Immature Granulocytes 0 %   Abs Immature Granulocytes 0.05 0.00 - 0.07 K/uL    Comment: Performed at [facility], [place] [address], [place], [place] [address]  Comprehensive metabolic panel     Status: Abnormal   Collection Time: 05/04/19   4:33 PM  Result Value Ref Range   Sodium 134 (L) 135 - 145 mmol/L   Potassium 4.1 3.5 - 5.1 mmol/L   Chloride 92 (L) 98 - 111 mmol/L   CO2 29 22 - 32 mmol/L   Glucose, Bld 138 (H) 70 - 99 mg/dL   BUN 19 8 - 23 mg/dL   Creatinine, Ser 1.15 0.61 - 1.24 mg/dL   Calcium 9.9 8.9 - 10.3 mg/dL   Total Protein 8.2 (H) 6.5 - 8.1 g/dL   Albumin 4.7 3.5 - 5.0 g/dL   AST 20 15 - 41 U/L   ALT 14 0 - 44 U/L   Alkaline Phosphatase 79 38 - 126 U/L   Total Bilirubin 1.1 0.3 - 1.2 mg/dL   GFR calc non Af Amer 58 (L) >60 mL/min   GFR calc Af Amer >60 >60 mL/min   Anion gap 13 5 - 15    Comment: Performed at [facility], [place] [address], [place], [place] [address]  Lipase, blood     Status: None   Collection Time: 05/04/19  4:33 PM  Result Value Ref Range   Lipase 36 11 - 51 U/L    Comment: Performed at [facility], [place] [address] [place], [place] [address]    CT ABDOMEN PELVIS W CONTRAST  Result Date: 05/04/2019 CLINICAL DATA:  85 year old presenting with severe generalized abdominal pain that began approximately 1 week ago. Personal history of small-bowel obstruction due to adhesions. Personal history of a partial gastric resection. Personal history of at least partial esophagectomy due to esophageal cancer. EXAM: CT ABDOMEN AND PELVIS WITH CONTRAST TECHNIQUE: Multidetector CT imaging of the abdomen and pelvis was performed using the standard protocol following bolus administration of intravenous contrast. CONTRAST:  152mL OMNIPAQUE IOHEXOL 300 MG/ML IV. COMPARISON:  10/19/2018 and earlier. FINDINGS: Lower chest: Scar and bronchiectasis involving the lower lobes, unchanged. Visualized lung bases otherwise clear. Normal appearing intrathoracic stomach filled with fluid. Hepatobiliary: Liver normal in size and appearance. Numerous small gallstones in the gallbladder. No pericholecystic edema or inflammation. No biliary ductal dilation. Pancreas: Mildly atrophic  without evidence of mass, ductal dilation or peripancreatic inflammation. Spleen: Normal in size and appearance. Adrenals/Urinary Tract: Normal appearing adrenal glands. Benign cortical cysts involving the LEFT kidney. No solid renal masses. No hydronephrosis. No urinary tract calculi. Diverticulum arising from the RIGHT posterolateral wall of the urinary bladder. Stomach/Bowel: The  entire stomach is present within the chest in this patient with a prior esophagectomy. Multiple dilated loops of small bowel throughout the abdomen and pelvis, with an abrupt transition to normal caliber bowel in the mid abdomen near the midline. Moderate stool burden in the cecum and ascending colon. Remainder of the colon decompressed. Descending and sigmoid colon diverticulosis without evidence of acute diverticulitis. Normal appendix in the RIGHT UPPER pelvis. Vascular/Lymphatic: Severe aortoiliofemoral atherosclerosis without evidence of aneurysm. Visceral arteries atherosclerotic though patent. Normal-appearing portal venous and systemic venous systems. No pathologic lymphadenopathy. Reproductive: Moderate to marked prostate gland enlargement. Normal seminal vesicles. Other: No free intraperitoneal air. No ascites. Musculoskeletal: Mild degenerative disc disease at L3-4, L4-5 and L5-S1. Severe facet degenerative changes at these levels. Broad-based disc extrusion at L4-5 with severe multifactorial spinal stenosis at this level. Osseous demineralization. No acute findings. IMPRESSION: 1. Partial small bowel obstruction with an abrupt transition to normal caliber bowel in the mid abdomen near the midline. This may be due to an adhesion or a focal stenosis within the small bowel. 2. Cholelithiasis without evidence of acute cholecystitis. 3. Descending and sigmoid colon diverticulosis without evidence of acute diverticulitis. 4. Moderate to marked prostate gland enlargement. 5. Broad-based disc extrusion at L4-5 with severe  multifactorial spinal stenosis at this level. Aortic Atherosclerosis (ICD10-I70.0). Electronically Signed   By: Evangeline Dakin M.D.   On: 05/04/2019 18:47    Review of Systems  Constitutional: Negative for chills and fever.  Respiratory: Negative for shortness of breath.   Cardiovascular: Negative for chest pain.  Gastrointestinal: Positive for abdominal pain, nausea and vomiting.  All other systems reviewed and are negative.  Blood pressure (!) 178/84, pulse 87, temperature 97.9 F (36.6 C), resp. rate 20, height 6\' 2"  (1.88 m), weight 83.5 kg, SpO2 100 %. Physical Exam  Constitutional: He is oriented to person, place, and time.  Thin, elderly gentleman in no acute distress  HENT:  Head: Normocephalic.  Right Ear: External ear normal.  Left Ear: External ear normal.  Nose: Nose normal.  Mouth/Throat: No oropharyngeal exudate.  Eyes: Pupils are equal, round, and reactive to light. Right eye exhibits no discharge. Left eye exhibits no discharge. No scleral icterus.  Neck: No tracheal deviation present.  Cardiovascular: Normal rate, regular rhythm, normal heart sounds and intact distal pulses.  No murmur heard. Respiratory: Effort normal and breath sounds normal. No respiratory distress.  GI: He exhibits distension. There is abdominal tenderness.  Mildly distended abdomen with minimal tenderness.  Well-healed midline incision with no obvious hernias  Musculoskeletal:        General: No deformity or edema. Normal range of motion.     Cervical back: Normal range of motion and neck supple.  Neurological: He is alert and oriented to person, place, and time.  Skin: Skin is warm and dry. No erythema.  Psychiatric: His behavior is normal. Judgment normal.    Assessment/Plan: Partial small bowel obstruction  Again, we will try to treat this conservatively without surgical intervention given his multiple comorbidities.  Currently, he will remain at bowel rest.  Should he develop  recurrent nausea and vomiting hopefully he will proceed with nasogastric tube insertion.  We will repeat his abdominal x-rays in the morning and I will give him a suppository.  He has already been placed on Reglan.  He understands and agrees with the plan.  Coralie Keens 05/04/2019, 9:04 PM

## 2019-05-04 NOTE — ED Notes (Signed)
Urinal placed at bedside and patient given instructions that urine sample is needed.

## 2019-05-04 NOTE — ED Provider Notes (Signed)
[facility] DEPT Provider Note   CSN: [id number] Arrival date & time: 05/04/19  1546     History Chief Complaint  Patient presents with  . Possible Bowel Obstruction    Oscar [name] is a 85 y.o. male with pertinent past medical history of hypertension, GERD, esophageal cancer s/p surgery, recurrent SBO presents to the ER for evaluation of abdominal pain.  Onset Monday.  Has been trying to put up with it at home but it acutely worsened this morning.  Reports severe, constant epigastric and periumbilical abdominal pain similar to previous bouts of SBO associated with nausea, nonbilious nonbloody emesis.  Has had some chills but states this is chronic.  He took Metamucil and mag citrate and had a small nonbloody nonmelanotic stool early this morning.  He stopped passing gas after this and pain has persisted.  Denies fever, chest pain, shortness of breath, extremity tingling numbness.  No back pain.  No urinary symptoms.  Previous SBO's have resolved without intervention.  States he has noticed his blood pressure being higher than normal but states this is probably from the pain.  Has been compliant with blood pressure medicines.  HPI     Past Medical History:  Diagnosis Date  . Acid reflux   . Barrett's esophagus   . BPH (benign prostatic hyperplasia)   . Colon polyps   . DDD (degenerative disc disease)   . Diabetes mellitus    controlled with diet and exercise  . ED (erectile dysfunction)   . Esophageal cancer ([place])    tx'd surgery - abdominal & right posterior chest approach (2001) - [place], [place]  . First degree AV block   . Gout   . Hemorrhoids   . History of palpitations   . Hyperlipidemia   . Hypertension   . Lipoma   . Obesity   . Osteoarthritis   . Tinea versicolor   . Tinnitus     Patient Active Problem List   Diagnosis Date Noted  . Tobacco abuse 10/19/2018  . BPH (benign prostatic hyperplasia) 10/19/2018  . AKI (acute kidney  injury) ([place])   . Acute on chronic respiratory failure with hypoxia ([place]) 11/15/2017  . Malnutrition of moderate degree 02/15/2017  . Leukocytosis 02/12/2017  . OSA (obstructive sleep apnea) 02/12/2017  . Dehydration 02/12/2017  . Partial small bowel obstruction ([place]) 01/29/2017  . SBO (small bowel obstruction) ([place]) 10/31/2016  . Aortic insufficiency 07/09/2013  . HTN (hypertension) 07/09/2013  . PERSONAL HISTORY MALIGNANT NEOPLASM ESOPHAGUS 07/17/2009  . GERD 07/13/2009  . BARRETT'S ESOPHAGUS 07/13/2009  . DYSPHAGIA 07/13/2009  . FLATULENCE-GAS-BLOATING 07/13/2009    Past Surgical History:  Procedure Laterality Date  . bilateral olecranon bursal excisions    . Esophageal Cancer Surgery  2000, 2001  . GASTRIC RESECTION    . HEMORRHOID SURGERY    . NM MYOCAR PERF WALL MOTION  2011   dipyridamole myoview - stress images show medium in size, moderate in intensity perfusion defect in basal inferior & mid inferior walls with mild defect reversibility at rest, EF 64%  . TRANSTHORACIC ECHOCARDIOGRAM  2011   EF=>55%, mild mitral annular calcif, mild calcif of MV apparatus, mild TR, normal RSVP, mild AV regurg, aortic root sclerosis/calcifiication  . VASECTOMY         Family History  Problem Relation Age of Onset  . Diabetes Mother   . CAD Mother   . Stroke Sister   . Colon cancer Neg Hx   . Esophageal cancer  Neg Hx   . Rectal cancer Neg Hx   . Stomach cancer Neg Hx     Social History   Tobacco Use  . Smoking status: Current Some Day Smoker    Years: 50.00    Types: Cigars, Pipe  . Smokeless tobacco: Never Used  . Tobacco comment: 2-3x/daily, sometimes not at all , tobacco info given 08/01/13  Substance Use Topics  . Alcohol use: Yes    Alcohol/week: 7.0 - 14.0 standard drinks    Types: 7 - 14 Standard drinks or equivalent per week  . Drug use: No    Home Medications Prior to Admission medications   Medication Sig Start Date End Date Taking? Authorizing Provider    alprostadil (MUSE) 1000 MCG pellet 1,000 mcg by Transurethral route daily as needed for erectile dysfunction. use no more than 3 times per week   Yes [provider]  Ascorbic Acid (VITAMIN C) 1000 MG tablet Take 1,000 mg by mouth daily.   Yes [provider]  atenolol (TENORMIN) 25 MG tablet Take 25 mg by mouth daily. 03/23/19  Yes [provider]  bisacodyl (DULCOLAX) 5 MG EC tablet Take 5 mg by mouth at bedtime as needed for moderate constipation.   Yes [provider]  feeding supplement, ENSURE ENLIVE, (ENSURE ENLIVE) LIQD Take 237 mLs by mouth 2 (two) times daily between meals. 10/26/18  Yes Dana Allan I, MD  finasteride (PROSCAR) 5 MG tablet Take 5 mg by mouth daily. 04/25/19  Yes [provider]  HYDROcodone-acetaminophen (NORCO/VICODIN) 5-325 MG per tablet Take 1 tablet by mouth every 6 (six) hours as needed for moderate pain.   Yes [provider]  losartan-hydrochlorothiazide (HYZAAR) 100-12.5 MG tablet Take 0.5 tablets by mouth daily.  02/02/17  Yes [provider]  omeprazole (PRILOSEC) 20 MG capsule Take 40 mg by mouth 2 (two) times daily before a meal.    Yes [provider]  polyethylene glycol (MIRALAX / GLYCOLAX) packet Take 17-34 g by mouth daily.    Yes [provider]  carvedilol (COREG) 6.25 MG tablet Take 1 tablet (6.25 mg total) by mouth 2 (two) times daily with a meal for 30 days. 10/25/18 11/24/18  Bonnell Public, MD    Allergies    Bee venom and Tamsulosin  Review of Systems   Review of Systems  Constitutional: Positive for chills.  Gastrointestinal: Positive for abdominal pain, nausea and vomiting.  All other systems reviewed and are negative.   Physical Exam Updated Vital Signs BP (!) 179/85   Pulse 79   Temp 97.9 F (36.6 C)   Resp 17   Ht 6\' 2"  (1.88 m)   Wt 83.5 kg   SpO2 98%   BMI 23.62 kg/m   Physical Exam Vitals and nursing note reviewed.  Constitutional:       General: He is not in acute distress.    Appearance: He is well-developed.     Comments: Nontoxic but looks uncomfortable  HENT:     Head: Normocephalic and atraumatic.     Right Ear: External ear normal.     Left Ear: External ear normal.     Nose: Nose normal.  Eyes:     General: No scleral icterus.    Conjunctiva/sclera: Conjunctivae normal.  Cardiovascular:     Rate and Rhythm: Normal rate and regular rhythm.     Heart sounds: Normal heart sounds.     Comments: 1+ radial DP pulses bilaterally.  No murmurs. Pulmonary:  Effort: Pulmonary effort is normal.     Breath sounds: Normal breath sounds.  Abdominal:     Palpations: Abdomen is soft.     Tenderness: There is abdominal tenderness.     Comments: Large midline surgical incision.  No obvious distention.  Soft.  Generalized tenderness, most significant in periumbilical epigastric regions.  No guarding, rigidity.  Unable to auscultate bowel sounds to lower quadrants.  No suprapubic or CVA tenderness.  No pulsatility.  Musculoskeletal:        General: No deformity. Normal range of motion.     Cervical back: Normal range of motion and neck supple.  Skin:    General: Skin is warm and dry.     Capillary Refill: Capillary refill takes less than 2 seconds.  Neurological:     Mental Status: He is alert and oriented to person, place, and time.     Comments: Sensation and strength intact in distal/bilateral extremities  Psychiatric:        Behavior: Behavior normal.        Thought Content: Thought content normal.        Judgment: Judgment normal.     ED Results / Procedures / Treatments   Labs (all labs ordered are listed, but only abnormal results are displayed) Labs Reviewed  CBC WITH DIFFERENTIAL/PLATELET - Abnormal; Notable for the following components:      Result Value   WBC 13.3 (*)    Neutro Abs 12.0 (*)    All other components within normal limits  COMPREHENSIVE METABOLIC PANEL - Abnormal; Notable for the  following components:   Sodium 134 (*)    Chloride 92 (*)    Glucose, Bld 138 (*)    Total Protein 8.2 (*)    GFR calc non Af Amer 58 (*)    All other components within normal limits  SARS CORONAVIRUS 2 (TAT 6-24 HRS)  LIPASE, BLOOD  URINALYSIS, ROUTINE W REFLEX MICROSCOPIC    EKG None  Radiology CT ABDOMEN PELVIS W CONTRAST  Result Date: 05/04/2019 CLINICAL DATA:  85 year old presenting with severe generalized abdominal pain that began approximately 1 week ago. Personal history of small-bowel obstruction due to adhesions. Personal history of a partial gastric resection. Personal history of at least partial esophagectomy due to esophageal cancer. EXAM: CT ABDOMEN AND PELVIS WITH CONTRAST TECHNIQUE: Multidetector CT imaging of the abdomen and pelvis was performed using the standard protocol following bolus administration of intravenous contrast. CONTRAST:  159mL OMNIPAQUE IOHEXOL 300 MG/ML IV. COMPARISON:  10/19/2018 and earlier. FINDINGS: Lower chest: Scar and bronchiectasis involving the lower lobes, unchanged. Visualized lung bases otherwise clear. Normal appearing intrathoracic stomach filled with fluid. Hepatobiliary: Liver normal in size and appearance. Numerous small gallstones in the gallbladder. No pericholecystic edema or inflammation. No biliary ductal dilation. Pancreas: Mildly atrophic without evidence of mass, ductal dilation or peripancreatic inflammation. Spleen: Normal in size and appearance. Adrenals/Urinary Tract: Normal appearing adrenal glands. Benign cortical cysts involving the LEFT kidney. No solid renal masses. No hydronephrosis. No urinary tract calculi. Diverticulum arising from the RIGHT posterolateral wall of the urinary bladder. Stomach/Bowel: The entire stomach is present within the chest in this patient with a prior esophagectomy. Multiple dilated loops of small bowel throughout the abdomen and pelvis, with an abrupt transition to normal caliber bowel in the mid  abdomen near the midline. Moderate stool burden in the cecum and ascending colon. Remainder of the colon decompressed. Descending and sigmoid colon diverticulosis without evidence of acute diverticulitis. Normal appendix in the  RIGHT UPPER pelvis. Vascular/Lymphatic: Severe aortoiliofemoral atherosclerosis without evidence of aneurysm. Visceral arteries atherosclerotic though patent. Normal-appearing portal venous and systemic venous systems. No pathologic lymphadenopathy. Reproductive: Moderate to marked prostate gland enlargement. Normal seminal vesicles. Other: No free intraperitoneal air. No ascites. Musculoskeletal: Mild degenerative disc disease at L3-4, L4-5 and L5-S1. Severe facet degenerative changes at these levels. Broad-based disc extrusion at L4-5 with severe multifactorial spinal stenosis at this level. Osseous demineralization. No acute findings. IMPRESSION: 1. Partial small bowel obstruction with an abrupt transition to normal caliber bowel in the mid abdomen near the midline. This may be due to an adhesion or a focal stenosis within the small bowel. 2. Cholelithiasis without evidence of acute cholecystitis. 3. Descending and sigmoid colon diverticulosis without evidence of acute diverticulitis. 4. Moderate to marked prostate gland enlargement. 5. Broad-based disc extrusion at L4-5 with severe multifactorial spinal stenosis at this level. Aortic Atherosclerosis (ICD10-I70.0). Electronically Signed   By: Evangeline Dakin M.D.   On: 05/04/2019 18:47    Procedures Procedures (including critical care time)  Medications Ordered in ED Medications  sodium chloride (PF) 0.9 % injection (has no administration in time range)  metoCLOPramide (REGLAN) injection 5 mg (has no administration in time range)  morphine 4 MG/ML injection 4 mg (4 mg Intravenous Given 05/04/19 1738)  ondansetron (ZOFRAN) injection 4 mg (4 mg Intravenous Given 05/04/19 1737)  iohexol (OMNIPAQUE) 300 MG/ML solution 100 mL (100  mLs Intravenous Contrast Given 05/04/19 1812)    ED Course  I have reviewed the triage vital signs and the nursing notes.  Pertinent labs & imaging results that were available during my care of the patient were reviewed by me and considered in my medical decision making (see chart for details).  Clinical Course as of May 03 2018  Sat May 04, 2019  1902 IMPRESSION: 1. Partial small bowel obstruction with an abrupt transition to normal caliber bowel in the mid abdomen near the midline. This may be due to an adhesion or a focal stenosis within the small bowel. 2. Cholelithiasis without evidence of acute cholecystitis. 3. Descending and sigmoid colon diverticulosis without evidence of acute diverticulitis. 4. Moderate to marked prostate gland enlargement. 5. Broad-based disc extrusion at L4-5 with severe multifactorial spinal stenosis at this level.  Aortic Atherosclerosis (ICD10-I70.0).  CT ABDOMEN PELVIS W CONTRAST [CG]    Clinical Course User Index [CG] Kinnie Feil, PA-C   MDM Rules/Calculators/A&P                      Highest on ddx is SBO, ileus.  Pt high risk for recurrent SBO.  Has symptoms suggestive of this. No fever, urinary symptoms doubt infectious process.   Labs, UA. Morphine, antiemetic. Will reassess.  Pt re-evaluated and continues to have pain nausea. No emesis. Repeat exam with persistent generalized/periumbilical tenderness. No peritonitis. Will order CTAP.   1948: CTAP confirms partial SBO.  Pt re-evaluated has continued pain, improvement in nausea, no emesis.  Discussed with general surgery Dr Ninfa Linden, recommends supportive medical tx for SBO, GSY will see in AM.  Pt declined NGT, will defer. He has improvement in nausea and no emesis.  Will consult medicine for admission. Discussed with EDP.  Final Clinical Impression(s) / ED Diagnoses Final diagnoses:  SBO (small bowel obstruction) [facility])    Rx / DC Orders ED Discharge Orders    None         Arlean Hopping 05/04/19 2019    Dorie Rank, MD  05/05/19 1203    Dorie Rank, MD 05/05/19 1204

## 2019-05-04 NOTE — H&P (Addendum)
TRH H&P    Patient Demographics:    Oscar [name], is a 85 y.o. male  MRN: [id number]  DOB - Aug 19, 1932  Admit Date - 05/04/2019  Referring MD/NP/PA: Rosemarie Ax  Outpatient Primary MD for the patient is Deland Pretty, MD  Patient coming from: home  Chief complaint-  Abdominal pain    HPI:    Oscar [name]  is a 85 y.o. male, w hypertension, hyperlipidemia, dm2, gout, bph, gerd,h/o esophageal cancer s/p resection, h/o SBO most recently 10/25/18, presents with complaints of abdominal pain for the past 2 days.  Pt notes generalized pain.  Pt notes n/v x3 (mostly dry heave),  Slight loose stool.  Pt denies fever, chills, cough, cp, palp, sob, brbpr, black stool .    In ED,  T 97.9 P 60 R 23, Bp 203/82  Pox 100% on RA Wt 83.5kg  CT abd/ pelvis IMPRESSION: 1. Partial small bowel obstruction with an abrupt transition to normal caliber bowel in the mid abdomen near the midline. This may be due to an adhesion or a focal stenosis within the small bowel. 2. Cholelithiasis without evidence of acute cholecystitis. 3. Descending and sigmoid colon diverticulosis without evidence of acute diverticulitis. 4. Moderate to marked prostate gland enlargement. 5. Broad-based disc extrusion at L4-5 with severe multifactorial spinal stenosis at this level.  Wbc 13.3, Hgb 14.4, Plt 315 Na 134, K 4.1, Bun 19, Creatinine 1.15 Ast 20, Alt 14, Alk phos 79, T. Bili 1.1  Lipase 36  Pt apparently refusing NGT per ED  Pt will be admitted for partial SBO.     Review of systems:    In addition to the HPI above,  No Fever-chills, No Headache, No changes with Vision or hearing, No problems swallowing food or Liquids, No Chest pain, Cough or Shortness of Breath,  No Blood in stool or Urine, No dysuria, No new skin rashes or bruises, No new joints pains-aches,  No new weakness, tingling, numbness in any extremity, No recent  weight gain or loss, No polyuria, polydypsia or polyphagia, No significant Mental Stressors.  All other systems reviewed and are negative.    Past History of the following :    Past Medical History:  Diagnosis Date  . Acid reflux   . Barrett's esophagus   . BPH (benign prostatic hyperplasia)   . Colon polyps   . DDD (degenerative disc disease)   . Diabetes mellitus    controlled with diet and exercise  . ED (erectile dysfunction)   . Esophageal cancer ([place])    tx'd surgery - abdominal & right posterior chest approach (2001) - [place], [place]  . First degree AV block   . Gout   . Hemorrhoids   . History of palpitations   . Hyperlipidemia   . Hypertension   . Lipoma   . Obesity   . Osteoarthritis   . Tinea versicolor   . Tinnitus       Past Surgical History:  Procedure Laterality Date  . bilateral olecranon bursal excisions    . Esophageal  Cancer Surgery  2000, 2001  . GASTRIC RESECTION    . HEMORRHOID SURGERY    . NM MYOCAR PERF WALL MOTION  2011   dipyridamole myoview - stress images show medium in size, moderate in intensity perfusion defect in basal inferior & mid inferior walls with mild defect reversibility at rest, EF 64%  . TRANSTHORACIC ECHOCARDIOGRAM  2011   EF=>55%, mild mitral annular calcif, mild calcif of MV apparatus, mild TR, normal RSVP, mild AV regurg, aortic root sclerosis/calcifiication  . VASECTOMY        Social History:      Social History   Tobacco Use  . Smoking status: Current Some Day Smoker    Years: 50.00    Types: Cigars, Pipe  . Smokeless tobacco: Never Used  . Tobacco comment: 2-3x/daily, sometimes not at all , tobacco info given 08/01/13  Substance Use Topics  . Alcohol use: Yes    Alcohol/week: 7.0 - 14.0 standard drinks    Types: 7 - 14 Standard drinks or equivalent per week       Family History :     Family History  Problem Relation Age of Onset  . Diabetes Mother   . CAD Mother   . Stroke Sister   . Colon  cancer Neg Hx   . Esophageal cancer Neg Hx   . Rectal cancer Neg Hx   . Stomach cancer Neg Hx        Home Medications:   Prior to Admission medications   Medication Sig Start Date End Date Taking? Authorizing Provider  alprostadil (MUSE) 1000 MCG pellet 1,000 mcg by Transurethral route daily as needed for erectile dysfunction. use no more than 3 times per week   Yes [provider]  Ascorbic Acid (VITAMIN C) 1000 MG tablet Take 1,000 mg by mouth daily.   Yes [provider]  atenolol (TENORMIN) 25 MG tablet Take 25 mg by mouth daily. 03/23/19  Yes [provider]  bisacodyl (DULCOLAX) 5 MG EC tablet Take 5 mg by mouth at bedtime as needed for moderate constipation.   Yes [provider]  feeding supplement, ENSURE ENLIVE, (ENSURE ENLIVE) LIQD Take 237 mLs by mouth 2 (two) times daily between meals. 10/26/18  Yes Dana Allan I, MD  finasteride (PROSCAR) 5 MG tablet Take 5 mg by mouth daily. 04/25/19  Yes [provider]  HYDROcodone-acetaminophen (NORCO/VICODIN) 5-325 MG per tablet Take 1 tablet by mouth every 6 (six) hours as needed for moderate pain.   Yes [provider]  losartan-hydrochlorothiazide (HYZAAR) 100-12.5 MG tablet Take 0.5 tablets by mouth daily.  02/02/17  Yes [provider]  omeprazole (PRILOSEC) 20 MG capsule Take 40 mg by mouth 2 (two) times daily before a meal.    Yes [provider]  polyethylene glycol (MIRALAX / GLYCOLAX) packet Take 17-34 g by mouth daily.    Yes [provider]  carvedilol (COREG) 6.25 MG tablet Take 1 tablet (6.25 mg total) by mouth 2 (two) times daily with a meal for 30 days. 10/25/18 11/24/18  Bonnell Public, MD     Allergies:     Allergies  Allergen Reactions  . Bee Venom Anaphylaxis  . Tamsulosin Other (See Comments)    "Makes me feel weird"     Physical Exam:   Vitals  Blood pressure (!) 179/85, pulse 79, temperature 97.9 F (36.6 C), resp.  rate 17, height '6\' 2"'  (1.88 m), weight 83.5 kg, SpO2 98 %.  1.  General: axoxo3  2. Psychiatric: euthymic  3. Neurologic: nonfocal  4. HEENMT:  Anicteric, pupils 1.40m symmetric, direct, consensual, near intact Neck: no jvd  5. Respiratory : CTAB  6. Cardiovascular : rrr s1, s2, no m/g/r  7. Gastrointestinal:  Abd: soft, nt, nd, +bs  8. Skin:  Ext: no c/c/e,  No rash  9.Musculoskeletal:  Good ROM    Data Review:    CBC Recent Labs  Lab 05/04/19 1633  WBC 13.3*  HGB 14.4  HCT 43.0  PLT 315  MCV 95.8  MCH 32.1  MCHC 33.5  RDW 11.6  LYMPHSABS 0.9  MONOABS 0.3  EOSABS 0.0  BASOSABS 0.0   ------------------------------------------------------------------------------------------------------------------  Results for orders placed or performed during the hospital encounter of 05/04/19 (from the past 48 hour(s))  CBC with Differential     Status: Abnormal   Collection Time: 05/04/19  4:33 PM  Result Value Ref Range   WBC 13.3 (H) 4.0 - 10.5 K/uL   RBC 4.49 4.22 - 5.81 MIL/uL   Hemoglobin 14.4 13.0 - 17.0 g/dL   HCT 43.0 39.0 - 52.0 %   MCV 95.8 80.0 - 100.0 fL   MCH 32.1 26.0 - 34.0 pg   MCHC 33.5 30.0 - 36.0 g/dL   RDW 11.6 11.5 - 15.5 %   Platelets 315 150 - 400 K/uL   nRBC 0.0 0.0 - 0.2 %   Neutrophils Relative % 90 %   Neutro Abs 12.0 (H) 1.7 - 7.7 K/uL   Lymphocytes Relative 7 %   Lymphs Abs 0.9 0.7 - 4.0 K/uL   Monocytes Relative 3 %   Monocytes Absolute 0.3 0.1 - 1.0 K/uL   Eosinophils Relative 0 %   Eosinophils Absolute 0.0 0.0 - 0.5 K/uL   Basophils Relative 0 %   Basophils Absolute 0.0 0.0 - 0.1 K/uL   Immature Granulocytes 0 %   Abs Immature Granulocytes 0.05 0.00 - 0.07 K/uL    Comment: [facility]formed [facility] [address] [place] [place] [address] Comprehensive metabolic panel     Status: Abnormal   Collection Time: 05/04/19  4:33 PM  Result Value Ref Range   Sodium 134 (L) 135 - 145 mmol/L   Potassium  4.1 3.5 - 5.1 mmol/L   Chloride 92 (L) 98 - 111 mmol/L   CO2 29 22 - 32 mmol/L   Glucose, Bld 138 (H) 70 - 99 mg/dL   BUN 19 8 - 23 mg/dL   Creatinine, Ser 1.15 0.61 - 1.24 mg/dL   Calcium 9.9 8.9 - 10.3 mg/dL   Total Protein 8.2 (H) 6.5 - 8.1 g/dL   Albumin 4.7 3.5 - 5.0 g/dL   AST 20 15 - 41 U/L   ALT 14 0 - 44 U/L   Alkaline Phosphatase 79 38 - 126 U/L   Total Bilirubin 1.1 0.3 - 1.2 mg/dL   GFR calc non Af Amer 58 (L) >60 mL/min   GFR calc Af Amer >60 >60 mL/min   Anion gap 13 5 - 15    Comment: [facility]formed at [facility] [address], [id number] Lipase, blood     Status: None   Collection Time: 05/04/19  4:33 PM  Result Value Ref Range   Lipase 36 11 - 51 U/L    Comment: Performed at [facility] [address], [place] [address]   Chemistries  Recent Labs  Lab 05/04/19 1633  NA 134*  K 4.1  CL 92*  CO2 29  GLUCOSE 138*  BUN 19  CREATININE 1.15  CALCIUM 9.9  AST 20  ALT 14  ALKPHOS 79  BILITOT 1.1   ------------------------------------------------------------------------------------------------------------------  ------------------------------------------------------------------------------------------------------------------ GFR: Estimated Creatinine Clearance: 55.6 mL/min (by C-G formula based on SCr of 1.15 mg/dL). Liver Function Tests: Recent Labs  Lab 05/04/19 1633  AST 20  ALT 14  ALKPHOS 79  BILITOT 1.1  PROT 8.2*  ALBUMIN 4.7   Recent Labs  Lab 05/04/19 1633  LIPASE 36   No results for input(s): AMMONIA in the last 168 hours. Coagulation Profile: No results for input(s): INR, PROTIME in the last 168 hours. Cardiac Enzymes: No results for input(s): CKTOTAL, CKMB, CKMBINDEX, TROPONINI in the last 168 hours. BNP (last 3 results) No results for input(s): PROBNP in the last 8760 hours. HbA1C: No results for input(s): HGBA1C in the last 72 hours. CBG: No results for  input(s): GLUCAP in the last 168 hours. Lipid Profile: No results for input(s): CHOL, HDL, LDLCALC, TRIG, CHOLHDL, LDLDIRECT in the last 72 hours. Thyroid Function Tests: No results for input(s): TSH, T4TOTAL, FREET4, T3FREE, THYROIDAB in the last 72 hours. Anemia Panel: No results for input(s): VITAMINB12, FOLATE, FERRITIN, TIBC, IRON, RETICCTPCT in the last 72 hours.  --------------------------------------------------------------------------------------------------------------- Urine analysis:    Component Value Date/Time   COLORURINE YELLOW 10/19/2018 0950   APPEARANCEUR CLEAR 10/19/2018 0950   LABSPEC >1.046 (H) 10/19/2018 0950   PHURINE 5.0 10/19/2018 0950   GLUCOSEU NEGATIVE 10/19/2018 0950   HGBUR MODERATE (A) 10/19/2018 0950   BILIRUBINUR NEGATIVE 10/19/2018 0950   KETONESUR NEGATIVE 10/19/2018 0950   PROTEINUR NEGATIVE 10/19/2018 0950   UROBILINOGEN 0.2 09/12/2014 1816   NITRITE NEGATIVE 10/19/2018 0950   LEUKOCYTESUR NEGATIVE 10/19/2018 0950      Imaging Results:    CT ABDOMEN PELVIS W CONTRAST  Result Date: 05/04/2019 CLINICAL DATA:  85 year old presenting with severe generalized abdominal pain that began approximately 1 week ago. Personal history of small-bowel obstruction due to adhesions. Personal history of a partial gastric resection. Personal history of at least partial esophagectomy due to esophageal cancer. EXAM: CT ABDOMEN AND PELVIS WITH CONTRAST TECHNIQUE: Multidetector CT imaging of the abdomen and pelvis was performed using the standard protocol following bolus administration of intravenous contrast. CONTRAST:  133m OMNIPAQUE IOHEXOL 300 MG/ML IV. COMPARISON:  10/19/2018 and earlier. FINDINGS: Lower chest: Scar and bronchiectasis involving the lower lobes, unchanged. Visualized lung bases otherwise clear. Normal appearing intrathoracic stomach filled with fluid. Hepatobiliary: Liver normal in size and appearance. Numerous small gallstones in the gallbladder.  No pericholecystic edema or inflammation. No biliary ductal dilation. Pancreas: Mildly atrophic without evidence of mass, ductal dilation or peripancreatic inflammation. Spleen: Normal in size and appearance. Adrenals/Urinary Tract: Normal appearing adrenal glands. Benign cortical cysts involving the LEFT kidney. No solid renal masses. No hydronephrosis. No urinary tract calculi. Diverticulum arising from the RIGHT posterolateral wall of the urinary bladder. Stomach/Bowel: The entire stomach is present within the chest in this patient with a prior esophagectomy. Multiple dilated loops of small bowel throughout the abdomen and pelvis, with an abrupt transition to normal caliber bowel in the mid abdomen near the midline. Moderate stool burden in the cecum and ascending colon. Remainder of the colon decompressed. Descending and sigmoid colon diverticulosis without evidence of acute diverticulitis. Normal appendix in the RIGHT UPPER pelvis. Vascular/Lymphatic: Severe aortoiliofemoral atherosclerosis without evidence of aneurysm. Visceral arteries atherosclerotic though patent. Normal-appearing portal venous and systemic venous systems. No pathologic lymphadenopathy. Reproductive: Moderate to marked prostate  gland enlargement. Normal seminal vesicles. Other: No free intraperitoneal air. No ascites. Musculoskeletal: Mild degenerative disc disease at L3-4, L4-5 and L5-S1. Severe facet degenerative changes at these levels. Broad-based disc extrusion at L4-5 with severe multifactorial spinal stenosis at this level. Osseous demineralization. No acute findings. IMPRESSION: 1. Partial small bowel obstruction with an abrupt transition to normal caliber bowel in the mid abdomen near the midline. This may be due to an adhesion or a focal stenosis within the small bowel. 2. Cholelithiasis without evidence of acute cholecystitis. 3. Descending and sigmoid colon diverticulosis without evidence of acute diverticulitis. 4. Moderate to  marked prostate gland enlargement. 5. Broad-based disc extrusion at L4-5 with severe multifactorial spinal stenosis at this level. Aortic Atherosclerosis (ICD10-I70.0). Electronically Signed   By: Evangeline Dakin M.D.   On: 05/04/2019 18:47       Assessment & Plan:    Principal Problem:   Partial small bowel obstruction (HCC) Active Problems:   HTN (hypertension)   BPH (benign prostatic hyperplasia)  Partial SBO NPO Ns iv Dilaudid 0.69m iv q4h prn  zofran 431miv q6h prn  Pt declines NGT for now Surgery (BNinfa Lindenconsulted by ED, per ED will be by in AM appreciate input  Hypertension Hold Atenolol/ carvedilol/ Losartan/hydrochlorothiazide Metoprolol 2.5m62mv q8h,  Hydralazine 5mg35m q6h prn sbp >160  Gerd DC prilosec Pepcid 20mg15mbid  Bph Hold Finasteride, restart when able to tolerate oral medications  Microscopic hematuria Consider  outpatient urological consultation  DVT Prophylaxis-   Lovenox - SCDs   AM Labs Ordered, also please review Full Orders  Family Communication: Admission, patients condition and plan of care including tests being ordered have been discussed with the patient who indicate understanding and agree with the plan and Code Status.  Code Status:  FULL CODE per patient, [facility]ation: Based on patients clinical presentation and evaluation of above clinical data, I have made determination that patient meets observation criteria at this time.  Pt may possibly require inaptient stay > 2 nites if SBO not resolving quickly  Time spent in minutes : 55 minutes   JamesJani Gravelon 05/04/2019 at 8:33 PM

## 2019-05-04 NOTE — ED Triage Notes (Signed)
Patient arrived via EMS c/o severe abdominal pain that started a week ago. Patient has significant abdominal history and has 2 previous abdominal surgeries that left a lot of scar tissue. Patient can normally manage with laxatives but has had severe pain this last week.   AOx4   210/110 (hx: HTN, has not taken meds today) 78 HR 20 R 97% RA 98.4

## 2019-05-05 ENCOUNTER — Observation Stay (HOSPITAL_COMMUNITY): Payer: Medicare Other

## 2019-05-05 ENCOUNTER — Inpatient Hospital Stay (HOSPITAL_COMMUNITY): Payer: Medicare Other

## 2019-05-05 ENCOUNTER — Other Ambulatory Visit: Payer: Self-pay

## 2019-05-05 DIAGNOSIS — E785 Hyperlipidemia, unspecified: Secondary | ICD-10-CM | POA: Diagnosis present

## 2019-05-05 DIAGNOSIS — K56609 Unspecified intestinal obstruction, unspecified as to partial versus complete obstruction: Secondary | ICD-10-CM | POA: Diagnosis not present

## 2019-05-05 DIAGNOSIS — Z833 Family history of diabetes mellitus: Secondary | ICD-10-CM | POA: Diagnosis not present

## 2019-05-05 DIAGNOSIS — I10 Essential (primary) hypertension: Secondary | ICD-10-CM | POA: Diagnosis not present

## 2019-05-05 DIAGNOSIS — Z91030 Bee allergy status: Secondary | ICD-10-CM | POA: Diagnosis not present

## 2019-05-05 DIAGNOSIS — G4733 Obstructive sleep apnea (adult) (pediatric): Secondary | ICD-10-CM | POA: Diagnosis present

## 2019-05-05 DIAGNOSIS — F17290 Nicotine dependence, other tobacco product, uncomplicated: Secondary | ICD-10-CM | POA: Diagnosis present

## 2019-05-05 DIAGNOSIS — R3129 Other microscopic hematuria: Secondary | ICD-10-CM | POA: Diagnosis present

## 2019-05-05 DIAGNOSIS — Z888 Allergy status to other drugs, medicaments and biological substances status: Secondary | ICD-10-CM | POA: Diagnosis not present

## 2019-05-05 DIAGNOSIS — E119 Type 2 diabetes mellitus without complications: Secondary | ICD-10-CM | POA: Diagnosis present

## 2019-05-05 DIAGNOSIS — K565 Intestinal adhesions [bands], unspecified as to partial versus complete obstruction: Secondary | ICD-10-CM | POA: Diagnosis present

## 2019-05-05 DIAGNOSIS — I440 Atrioventricular block, first degree: Secondary | ICD-10-CM | POA: Diagnosis present

## 2019-05-05 DIAGNOSIS — Z4682 Encounter for fitting and adjustment of non-vascular catheter: Secondary | ICD-10-CM | POA: Diagnosis not present

## 2019-05-05 DIAGNOSIS — M5126 Other intervertebral disc displacement, lumbar region: Secondary | ICD-10-CM | POA: Diagnosis present

## 2019-05-05 DIAGNOSIS — Z8501 Personal history of malignant neoplasm of esophagus: Secondary | ICD-10-CM | POA: Diagnosis not present

## 2019-05-05 DIAGNOSIS — M109 Gout, unspecified: Secondary | ICD-10-CM | POA: Diagnosis present

## 2019-05-05 DIAGNOSIS — Z823 Family history of stroke: Secondary | ICD-10-CM | POA: Diagnosis not present

## 2019-05-05 DIAGNOSIS — M1990 Unspecified osteoarthritis, unspecified site: Secondary | ICD-10-CM | POA: Diagnosis present

## 2019-05-05 DIAGNOSIS — N400 Enlarged prostate without lower urinary tract symptoms: Secondary | ICD-10-CM | POA: Diagnosis present

## 2019-05-05 DIAGNOSIS — K56600 Partial intestinal obstruction, unspecified as to cause: Secondary | ICD-10-CM | POA: Diagnosis not present

## 2019-05-05 DIAGNOSIS — Z8249 Family history of ischemic heart disease and other diseases of the circulatory system: Secondary | ICD-10-CM | POA: Diagnosis not present

## 2019-05-05 DIAGNOSIS — K219 Gastro-esophageal reflux disease without esophagitis: Secondary | ICD-10-CM | POA: Diagnosis present

## 2019-05-05 DIAGNOSIS — Z20822 Contact with and (suspected) exposure to covid-19: Secondary | ICD-10-CM | POA: Diagnosis present

## 2019-05-05 DIAGNOSIS — R109 Unspecified abdominal pain: Secondary | ICD-10-CM | POA: Diagnosis not present

## 2019-05-05 LAB — COMPREHENSIVE METABOLIC PANEL
ALT: 13 U/L (ref 0–44)
AST: 19 U/L (ref 15–41)
Albumin: 4 g/dL (ref 3.5–5.0)
Alkaline Phosphatase: 64 U/L (ref 38–126)
Anion gap: 10 (ref 5–15)
BUN: 16 mg/dL (ref 8–23)
CO2: 30 mmol/L (ref 22–32)
Calcium: 9.3 mg/dL (ref 8.9–10.3)
Chloride: 96 mmol/L — ABNORMAL LOW (ref 98–111)
Creatinine, Ser: 1.17 mg/dL (ref 0.61–1.24)
GFR calc Af Amer: >60 mL/min (ref >60)
GFR calc non Af Amer: 57 mL/min — ABNORMAL LOW (ref >60)
Glucose, Bld: 106 mg/dL — ABNORMAL HIGH (ref 70–99)
Potassium: 3.6 mmol/L (ref 3.5–5.1)
Sodium: 136 mmol/L (ref 135–145)
Total Bilirubin: 1.4 mg/dL — ABNORMAL HIGH (ref 0.3–1.2)
Total Protein: 7.1 g/dL (ref 6.5–8.1)

## 2019-05-05 LAB — CBC
HCT: 41.6 % (ref 39.0–52.0)
Hemoglobin: 13.9 g/dL (ref 13.0–17.0)
MCH: 32.3 pg (ref 26.0–34.0)
MCHC: 33.4 g/dL (ref 30.0–36.0)
MCV: 96.7 fL (ref 80.0–100.0)
Platelets: 298 10*3/uL (ref 150–400)
RBC: 4.3 MIL/uL (ref 4.22–5.81)
RDW: 11.9 % (ref 11.5–15.5)
WBC: 11.4 10*3/uL — ABNORMAL HIGH (ref 4.0–10.5)
nRBC: 0 % (ref 0.0–0.2)

## 2019-05-05 LAB — GLUCOSE, CAPILLARY
Glucose-Capillary: 106 mg/dL — ABNORMAL HIGH (ref 70–99)
Glucose-Capillary: 106 mg/dL — ABNORMAL HIGH (ref 70–99)
Glucose-Capillary: 109 mg/dL — ABNORMAL HIGH (ref 70–99)
Glucose-Capillary: 82 mg/dL (ref 70–99)
Glucose-Capillary: 86 mg/dL (ref 70–99)
Glucose-Capillary: 87 mg/dL (ref 70–99)

## 2019-05-05 LAB — SARS CORONAVIRUS 2 (TAT 6-24 HRS): SARS Coronavirus 2: NEGATIVE

## 2019-05-05 MED ORDER — DEXTROSE-NACL 5-0.45 % IV SOLN: INTRAVENOUS | Status: AC

## 2019-05-05 MED ORDER — SODIUM CHLORIDE 0.9 % IV SOLN: INTRAVENOUS | Status: DC

## 2019-05-05 MED ORDER — DIATRIZOATE MEGLUMINE & SODIUM 66-10 % PO SOLN
90.0000 mL | Freq: Once | ORAL | Status: AC
  Administered 2019-05-05: 90 mL via NASOGASTRIC
  Filled 2019-05-05: qty 90

## 2019-05-05 MED ORDER — POTASSIUM CHLORIDE 10 MEQ/100ML IV SOLN
10.0000 meq | INTRAVENOUS | Status: AC
  Administered 2019-05-05 (×4): 10 meq via INTRAVENOUS
  Filled 2019-05-05 (×4): qty 100

## 2019-05-05 NOTE — Progress Notes (Signed)
PROGRESS NOTE    Oscar [name]  [id number] DOB: 12/16/1932 DOA: 05/04/2019 PCP: Deland Pretty, MD   Brief Narrative:  85 year old with history of HTN, DM 2, HLD, gout, BPH, GERD, esophageal cancer status post resection, small bowel obstruction-recurrent presented with abdominal pain found to have partial small bowel obstruction.  Initially did not want NG tube therefore conservative treatment was started.  General surgery consulted.   Assessment & Plan:   Principal Problem:   Partial small bowel obstruction (HCC) Active Problems:   HTN (hypertension)   BPH (benign prostatic hyperplasia)  Partial small bowel obstruction -General surgery is recommending NG tube placement -Supportive care, gentle hydration, monitor urine output -Antiemetics, pain control.  Small bowel protocol -Monitor electrolytes and replete aggressively -Out of bed to chair  Moderate to marked prostate enlargement -We will resume home medications once able to tolerate p.o.  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  Follow-up outpatient with primary care physician and neurosurgery.  No acute neuropathic symptoms/red flags  Essential hypertension -Home p.o. medications on hold.  IV as needed metoprolol and hydralazine ordered  GERD -PPI  History of esophageal cancer status post resection -Follow-up outpatient   DVT prophylaxis: Lovenox Code Status: Full Family Communication:   Disposition Plan: Maintain hospital stay until return of bowel function  Consultants:   General surgery  Procedures:   None  Antimicrobials:   None   Subjective: Very little bowel movement this morning.  Diminished bowel sounds.  Overall tells me he feels slightly better.  Review of Systems Otherwise negative except as per HPI, including: General: Denies fever, chills, night sweats or unintended weight loss. Resp: Denies cough, wheezing, shortness of breath. Cardiac: Denies chest  pain, palpitations, orthopnea, paroxysmal nocturnal dyspnea. GI: Denies nausea, vomiting, diarrhea or constipation GU: Denies dysuria, frequency, hesitancy or incontinence MS: Denies muscle aches, joint pain or swelling Neuro: Denies headache, neurologic deficits (focal weakness, numbness, tingling), abnormal gait Psych: Denies anxiety, depression, SI/HI/AVH Skin: Denies new rashes or lesions ID: Denies sick contacts, exotic exposures, travel  Objective: Vitals:   05/04/19 2040 05/04/19 2100 05/04/19 2131 05/05/19 0440  BP: (!) 178/84 (!) 174/81 (!) 161/82 (!) 149/74  Pulse: 87 88 86 76  Resp: 20 16 18 16   Temp:   98.6 F (37 C) 99.4 F (37.4 C)  TempSrc:   Oral Oral  SpO2: 100% 99% 94% 97%  Weight:    84.4 kg  Height:   6\' 2"  (1.88 m)     Intake/Output Summary (Last 24 hours) at 05/05/2019 1138 Last data filed at 05/05/2019 1128 Gross per 24 hour  Intake 751.1 ml  Output 335 ml  Net 416.1 ml   Filed Weights   05/04/19 1616 05/05/19 0440  Weight: 83.5 kg 84.4 kg    Examination:  General exam: Appears calm and comfortable  Respiratory system: Clear to auscultation. Respiratory effort normal. Cardiovascular system: S1 & S2 heard, RRR. No JVD, murmurs, rubs, gallops or clicks. No pedal edema. Gastrointestinal system: Old abdominal scar from the surgery noted.  Diminished bowel sounds. Central nervous system: Alert and oriented. No focal neurological deficits. Extremities: Symmetric 5 x 5 power. Skin: No rashes, lesions or ulcers Psychiatry: Judgement and insight appear normal. Mood & affect appropriate.     Data Reviewed:   CBC: Recent Labs  Lab 05/04/19 1633 05/05/19 0541  WBC 13.3* 11.4*  NEUTROABS 12.0*  --   HGB 14.4 13.9  HCT 43.0 41.6  MCV 95.8 96.7  PLT 315 298  Basic Metabolic Panel: Recent Labs  Lab 05/04/19 1633 05/05/19 0541  NA 134* 136  K 4.1 3.6  CL 92* 96*  CO2 29 30  GLUCOSE 138* 106*  BUN 19 16  CREATININE 1.15 1.17  CALCIUM  9.9 9.3   GFR: Estimated Creatinine Clearance: 54.6 mL/min (by C-G formula based on SCr of 1.17 mg/dL). Liver Function Tests: Recent Labs  Lab 05/04/19 1633 05/05/19 0541  AST 20 19  ALT 14 13  ALKPHOS 79 64  BILITOT 1.1 1.4*  PROT 8.2* 7.1  ALBUMIN 4.7 4.0   Recent Labs  Lab 05/04/19 1633  LIPASE 36   No results for input(s): AMMONIA in the last 168 hours. Coagulation Profile: No results for input(s): INR, PROTIME in the last 168 hours. Cardiac Enzymes: No results for input(s): CKTOTAL, CKMB, CKMBINDEX, TROPONINI in the last 168 hours. BNP (last 3 results) No results for input(s): PROBNP in the last 8760 hours. HbA1C: No results for input(s): HGBA1C in the last 72 hours. CBG: Recent Labs  Lab 05/04/19 2145 05/05/19 0035 05/05/19 0437 05/05/19 0746  GLUCAP 98 106* 106* 109*   Lipid Profile: No results for input(s): CHOL, HDL, LDLCALC, TRIG, CHOLHDL, LDLDIRECT in the last 72 hours. Thyroid Function Tests: No results for input(s): TSH, T4TOTAL, FREET4, T3FREE, THYROIDAB in the last 72 hours. Anemia Panel: No results for input(s): VITAMINB12, FOLATE, FERRITIN, TIBC, IRON, RETICCTPCT in the last 72 hours. Sepsis Labs: No results for input(s): PROCALCITON, LATICACIDVEN in the last 168 hours.  Recent Results (from the past 240 hour(s))  SARS CORONAVIRUS 2 (TAT 6-24 HRS) Nasopharyngeal Nasopharyngeal Swab     Status: None   Collection Time: 05/04/19  8:47 PM   Specimen: Nasopharyngeal Swab  Result Value Ref Range Status   SARS Coronavirus 2 NEGATIVE NEGATIVE Final    Comment: (NOTE) SARS-CoV-2 target nucleic acids are NOT DETECTED. The SARS-CoV-2 RNA is generally detectable in upper and lower respiratory specimens during the acute phase of infection. Negative results do not preclude SARS-CoV-2 infection, do not rule out co-infections with other pathogens, and should not be used as the sole basis for treatment or other patient management decisions. Negative  results must be combined with clinical observations, patient history, and epidemiological information. The expected result is Negative. Fact Sheet for Patients: SugarRoll.be Fact Sheet for Healthcare Providers: https://www.woods-mathews.com/ This test is not yet approved or cleared by the [place] FDA and  has been authorized for detection and/or diagnosis of SARS-CoV-2 by FDA under an Emergency Use Authorization (EUA). This EUA will remain  in effect (meaning this test can be used) for the duration of the COVID-19 declaration under Section 56 4(b)(1) of the Act, 21 U.S.C. section 360bbb-3(b)(1), unless the authorization is terminated or revoked sooner. Performed at [facility], [place] [address] [place], [place] [address]          Radiology Studies: CT ABDOMEN PELVIS W CONTRAST  Result Date: 05/04/2019 CLINICAL DATA:  85 year old presenting with severe generalized abdominal pain that began approximately 1 week ago. Personal history of small-bowel obstruction due to adhesions. Personal history of a partial gastric resection. Personal history of at least partial esophagectomy due to esophageal cancer. EXAM: CT ABDOMEN AND PELVIS WITH CONTRAST TECHNIQUE: Multidetector CT imaging of the abdomen and pelvis was performed using the standard protocol following bolus administration of intravenous contrast. CONTRAST:  189mL OMNIPAQUE IOHEXOL 300 MG/ML IV. COMPARISON:  10/19/2018 and earlier. FINDINGS: Lower chest: Scar and bronchiectasis involving the lower lobes, unchanged. Visualized lung bases  otherwise clear. Normal appearing intrathoracic stomach filled with fluid. Hepatobiliary: Liver normal in size and appearance. Numerous small gallstones in the gallbladder. No pericholecystic edema or inflammation. No biliary ductal dilation. Pancreas: Mildly atrophic without evidence of mass, ductal dilation or peripancreatic inflammation. Spleen: Normal  in size and appearance. Adrenals/Urinary Tract: Normal appearing adrenal glands. Benign cortical cysts involving the LEFT kidney. No solid renal masses. No hydronephrosis. No urinary tract calculi. Diverticulum arising from the RIGHT posterolateral wall of the urinary bladder. Stomach/Bowel: The entire stomach is present within the chest in this patient with a prior esophagectomy. Multiple dilated loops of small bowel throughout the abdomen and pelvis, with an abrupt transition to normal caliber bowel in the mid abdomen near the midline. Moderate stool burden in the cecum and ascending colon. Remainder of the colon decompressed. Descending and sigmoid colon diverticulosis without evidence of acute diverticulitis. Normal appendix in the RIGHT UPPER pelvis. Vascular/Lymphatic: Severe aortoiliofemoral atherosclerosis without evidence of aneurysm. Visceral arteries atherosclerotic though patent. Normal-appearing portal venous and systemic venous systems. No pathologic lymphadenopathy. Reproductive: Moderate to marked prostate gland enlargement. Normal seminal vesicles. Other: No free intraperitoneal air. No ascites. Musculoskeletal: Mild degenerative disc disease at L3-4, L4-5 and L5-S1. Severe facet degenerative changes at these levels. Broad-based disc extrusion at L4-5 with severe multifactorial spinal stenosis at this level. Osseous demineralization. No acute findings. IMPRESSION: 1. Partial small bowel obstruction with an abrupt transition to normal caliber bowel in the mid abdomen near the midline. This may be due to an adhesion or a focal stenosis within the small bowel. 2. Cholelithiasis without evidence of acute cholecystitis. 3. Descending and sigmoid colon diverticulosis without evidence of acute diverticulitis. 4. Moderate to marked prostate gland enlargement. 5. Broad-based disc extrusion at L4-5 with severe multifactorial spinal stenosis at this level. Aortic Atherosclerosis (ICD10-I70.0). Electronically  Signed   By: Evangeline Dakin M.D.   On: 05/04/2019 18:47   DG Abd Portable 1V  Result Date: 05/05/2019 CLINICAL DATA:  Small bowel obstruction. EXAM: PORTABLE ABDOMEN - 1 VIEW COMPARISON:  10/24/2018 and CT 05/04/2019 FINDINGS: Exam demonstrates continued evidence of a dilated small bowel loop over the mid abdomen measuring 5.6 cm in diameter. Air and stool present over the right colon. There is no free peritoneal air. Multiple surgical clips over the left upper quadrant. Contrast present over the bladder. Remainder of the exam is unchanged. IMPRESSION: Persistent air-filled dilated small bowel loops as seen on recent CT scan compatible with small bowel obstructive process. Electronically Signed   By: Marin Olp M.D.   On: 05/05/2019 05:23        Scheduled Meds: . diatrizoate meglumine-sodium  90 mL Per NG tube Once  . enoxaparin (LOVENOX) injection  40 mg Subcutaneous QHS  . insulin aspart  0-9 Units Subcutaneous Q4H  . metoprolol tartrate  2.5 mg Intravenous Q8H   Continuous Infusions: . sodium chloride 75 mL/hr at 05/05/19 0831  . famotidine (PEPCID) IV Stopped (05/04/19 2226)  . potassium chloride 10 mEq (05/05/19 1011)     LOS: 0 days   Time spent= 35 mins    Derrious Bologna Arsenio Loader, MD [facility]  If 7PM-7AM, please contact night-coverage  05/05/2019, 11:38 AM

## 2019-05-05 NOTE — Progress Notes (Addendum)
MD Lucia Gaskins with general surgery contacted to review xray for confirmation of placement of NG tube. MD advised this nurse to proceed with administration of Gastrografin, 1/3 of total amount at a time, to confirm patient ability to tolerate. Last bottle of Gastrografin administered at 1600 and imaging contacted and made aware of time as well. Will clamp tube for 1 hour per order. Will continue to monitor patient.

## 2019-05-05 NOTE — Progress Notes (Signed)
Subjective/Chief Complaint: Reports pain less, but no flatus or bm, no more emesis   Objective: Vital signs in last 24 hours: Temp:  [97.9 F (36.6 C)-99.4 F (37.4 C)] 99.4 F (37.4 C) (12/27 0440) Pulse Rate:  [60-88] 76 (12/27 0440) Resp:  [15-24] 16 (12/27 0440) BP: (149-203)/(74-88) 149/74 (12/27 0440) SpO2:  [94 %-100 %] 97 % (12/27 0440) Weight:  [83.5 kg-84.4 kg] 84.4 kg (12/27 0440)    Intake/Output from previous day: 12/26 0701 - 12/27 0700 In: 751.1 [I.V.:701; IV Piggyback:50.1] Out: 160 [Urine:160] Intake/Output this shift: No intake/output data recorded.  Exam: Awake and alert Abdomen still distended, minimally tender  Lab Results:  Recent Labs    05/04/19 1633 05/05/19 0541  WBC 13.3* 11.4*  HGB 14.4 13.9  HCT 43.0 41.6  PLT 315 298   BMET Recent Labs    05/04/19 1633 05/05/19 0541  NA 134* 136  K 4.1 3.6  CL 92* 96*  CO2 29 30  GLUCOSE 138* 106*  BUN 19 16  CREATININE 1.15 1.17  CALCIUM 9.9 9.3   PT/INR No results for input(s): LABPROT, INR in the last 72 hours. ABG No results for input(s): PHART, HCO3 in the last 72 hours.  Invalid input(s): PCO2, PO2  Studies/Results: CT ABDOMEN PELVIS W CONTRAST  Result Date: 05/04/2019 CLINICAL DATA:  85 year old presenting with severe generalized abdominal pain that began approximately 1 week ago. Personal history of small-bowel obstruction due to adhesions. Personal history of a partial gastric resection. Personal history of at least partial esophagectomy due to esophageal cancer. EXAM: CT ABDOMEN AND PELVIS WITH CONTRAST TECHNIQUE: Multidetector CT imaging of the abdomen and pelvis was performed using the standard protocol following bolus administration of intravenous contrast. CONTRAST:  18mL OMNIPAQUE IOHEXOL 300 MG/ML IV. COMPARISON:  10/19/2018 and earlier. FINDINGS: Lower chest: Scar and bronchiectasis involving the lower lobes, unchanged. Visualized lung bases otherwise clear. Normal  appearing intrathoracic stomach filled with fluid. Hepatobiliary: Liver normal in size and appearance. Numerous small gallstones in the gallbladder. No pericholecystic edema or inflammation. No biliary ductal dilation. Pancreas: Mildly atrophic without evidence of mass, ductal dilation or peripancreatic inflammation. Spleen: Normal in size and appearance. Adrenals/Urinary Tract: Normal appearing adrenal glands. Benign cortical cysts involving the LEFT kidney. No solid renal masses. No hydronephrosis. No urinary tract calculi. Diverticulum arising from the RIGHT posterolateral wall of the urinary bladder. Stomach/Bowel: The entire stomach is present within the chest in this patient with a prior esophagectomy. Multiple dilated loops of small bowel throughout the abdomen and pelvis, with an abrupt transition to normal caliber bowel in the mid abdomen near the midline. Moderate stool burden in the cecum and ascending colon. Remainder of the colon decompressed. Descending and sigmoid colon diverticulosis without evidence of acute diverticulitis. Normal appendix in the RIGHT UPPER pelvis. Vascular/Lymphatic: Severe aortoiliofemoral atherosclerosis without evidence of aneurysm. Visceral arteries atherosclerotic though patent. Normal-appearing portal venous and systemic venous systems. No pathologic lymphadenopathy. Reproductive: Moderate to marked prostate gland enlargement. Normal seminal vesicles. Other: No free intraperitoneal air. No ascites. Musculoskeletal: Mild degenerative disc disease at L3-4, L4-5 and L5-S1. Severe facet degenerative changes at these levels. Broad-based disc extrusion at L4-5 with severe multifactorial spinal stenosis at this level. Osseous demineralization. No acute findings. IMPRESSION: 1. Partial small bowel obstruction with an abrupt transition to normal caliber bowel in the mid abdomen near the midline. This may be due to an adhesion or a focal stenosis within the small bowel. 2.  Cholelithiasis without evidence of acute cholecystitis. 3.  Descending and sigmoid colon diverticulosis without evidence of acute diverticulitis. 4. Moderate to marked prostate gland enlargement. 5. Broad-based disc extrusion at L4-5 with severe multifactorial spinal stenosis at this level. Aortic Atherosclerosis (ICD10-I70.0). Electronically Signed   By: Evangeline Dakin M.D.   On: 05/04/2019 18:47   DG Abd Portable 1V  Result Date: 05/05/2019 CLINICAL DATA:  Small bowel obstruction. EXAM: PORTABLE ABDOMEN - 1 VIEW COMPARISON:  10/24/2018 and CT 05/04/2019 FINDINGS: Exam demonstrates continued evidence of a dilated small bowel loop over the mid abdomen measuring 5.6 cm in diameter. Air and stool present over the right colon. There is no free peritoneal air. Multiple surgical clips over the left upper quadrant. Contrast present over the bladder. Remainder of the exam is unchanged. IMPRESSION: Persistent air-filled dilated small bowel loops as seen on recent CT scan compatible with small bowel obstructive process. Electronically Signed   By: Marin Olp M.D.   On: 05/05/2019 05:23    Anti-infectives: Anti-infectives (From admission, onward)   None      Assessment/Plan: Small bowel obstruction  Xray still with dilated small bowel and clinically he is not improved.  I recommended placing an NG tube in the hopes of avoiding surgery.  He agrees to try.  Will also order the small bowel protocol xrays.  LOS: 0 days    Coralie Keens 05/05/2019

## 2019-05-05 NOTE — Progress Notes (Signed)
NGT hooked back to low intermittent suction per order. Pt tolerating well. Will continue to monitor.

## 2019-05-06 LAB — GLUCOSE, CAPILLARY
Glucose-Capillary: 109 mg/dL — ABNORMAL HIGH (ref 70–99)
Glucose-Capillary: 110 mg/dL — ABNORMAL HIGH (ref 70–99)
Glucose-Capillary: 118 mg/dL — ABNORMAL HIGH (ref 70–99)
Glucose-Capillary: 148 mg/dL — ABNORMAL HIGH (ref 70–99)
Glucose-Capillary: 89 mg/dL (ref 70–99)
Glucose-Capillary: 96 mg/dL (ref 70–99)

## 2019-05-06 MED ORDER — BISACODYL 10 MG RE SUPP
10.0000 mg | Freq: Once | RECTAL | Status: AC
  Administered 2019-05-06: 10 mg via RECTAL
  Filled 2019-05-06: qty 1

## 2019-05-06 MED ORDER — SIMETHICONE 80 MG PO CHEW
80.0000 mg | CHEWABLE_TABLET | Freq: Three times a day (TID) | ORAL | Status: DC | PRN
  Administered 2019-05-06 – 2019-05-07 (×2): 80 mg via ORAL
  Filled 2019-05-06 (×2): qty 1

## 2019-05-06 MED ORDER — DEXTROSE-NACL 5-0.45 % IV SOLN: INTRAVENOUS | Status: DC

## 2019-05-06 NOTE — Progress Notes (Signed)
PROGRESS NOTE    Oscar [name]  [id number] DOB: April 12, 1933 DOA: 05/04/2019 PCP: Deland Pretty, MD   Brief Narrative:  85 year old with history of HTN, DM 2, HLD, gout, BPH, GERD, esophageal cancer status post resection, small bowel obstruction-recurrent presented with abdominal pain found to have partial small bowel obstruction.  Initially did not want NG tube therefore conservative treatment was started.  General surgery consulted.   Assessment & Plan:   Principal Problem:   Partial small bowel obstruction (HCC) Active Problems:   HTN (hypertension)   SBO (small bowel obstruction) (HCC)   BPH (benign prostatic hyperplasia)  Partial small bowel obstruction Abdominal discomfort, persist -General surgery following -Conservative management, NG tube in place.  Follow symptomatically -Supportive care, gentle hydration, monitor urine output -Antiemetics, pain control.  Small bowel protocol -Monitor electrolytes and replete aggressively -Out of bed to chair  Moderate to marked prostate enlargement -We will resume home medications once able to tolerate p.o.  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  Follow-up outpatient with primary care physician and neurosurgery.  No acute neuropathic symptoms/red flags  Essential hypertension -P.o. meds on hold.  IV metoprolol and hydralazine as needed  GERD -PPI  History of esophageal cancer status post resection -Follow-up outpatient   DVT prophylaxis: Lovenox Code Status: Full Family Communication:   Disposition Plan: Maintain hospital stay until return of bowel function.  Unsafe for discharge.  Consultants:   General surgery  Procedures:   None  Antimicrobials:   None   Subjective: Little to no bowel movement or passing of gas.  Occasionally having feeling of nausea and belching.  Review of Systems Otherwise negative except as per HPI, including: General = no fevers, chills,  dizziness, malaise, fatigue HEENT/EYES = negative for pain, redness, loss of vision, double vision, blurred vision, loss of hearing, sore throat, hoarseness, dysphagia Cardiovascular= negative for chest pain, palpitation, murmurs, lower extremity swelling Respiratory/lungs= negative for shortness of breath, cough, hemoptysis, wheezing, mucus production Gastrointestinal= negative for melena, hematemesis Genitourinary= negative for Dysuria, Hematuria, Change in Urinary Frequency MSK = Negative for arthralgia, myalgias, Back Pain, Joint swelling  Neurology= Negative for headache, seizures, numbness, tingling  Psychiatry= Negative for anxiety, depression, suicidal and homocidal ideation Allergy/Immunology= Medication/Food allergy as listed  Skin= Negative for Rash, lesions, ulcers, itching   Objective: Vitals:   05/05/19 0440 05/05/19 1258 05/05/19 2126 05/06/19 0445  BP: (!) 149/74 (!) 162/83 135/76 (!) 161/84  Pulse: 76 64 66 79  Resp: 16 18 16 16   Temp: 99.4 F (37.4 C)  98 F (36.7 C) 98.3 F (36.8 C)  TempSrc: Oral  Oral Oral  SpO2: 97% 100% 99% 97%  Weight: 84.4 kg   83 kg  Height:        Intake/Output Summary (Last 24 hours) at 05/06/2019 1132 Last data filed at 05/06/2019 0600 Gross per 24 hour  Intake 1230.46 ml  Output 1575 ml  Net -344.54 ml   Filed Weights   05/04/19 1616 05/05/19 0440 05/06/19 0445  Weight: 83.5 kg 84.4 kg 83 kg    Examination:  Constitutional: Slight discomfort due to nausea, NG tube in place to low intermittent suction Respiratory: Clear to auscultation bilaterally Cardiovascular: Normal sinus rhythm, no rubs Abdomen: Diminished bowel sounds. Musculoskeletal: No edema noted Skin: No rashes seen Neurologic: CN 2-12 grossly intact.  And nonfocal Psychiatric: Normal judgment and insight. Alert and oriented x 3. Normal mood.     Data Reviewed:   CBC: Recent Labs  Lab 05/04/19 1633  05/05/19 0541  WBC 13.3* 11.4*  NEUTROABS 12.0*  --    HGB 14.4 13.9  HCT 43.0 41.6  MCV 95.8 96.7  PLT 315 [id number]   Basic Metabolic Panel: Recent Labs  Lab 05/04/19 1633 05/05/19 0541  NA 134* 136  K 4.1 3.6  CL 92* 96*  CO2 29 30  GLUCOSE 138* 106*  BUN 19 16  CREATININE 1.15 1.17  CALCIUM 9.9 9.3   GFR: Estimated Creatinine Clearance: 54.6 mL/min (by C-G formula based on SCr of 1.17 mg/dL). Liver Function Tests: Recent Labs  Lab 05/04/19 1633 05/05/19 0541  AST 20 19  ALT 14 13  ALKPHOS 79 64  BILITOT 1.1 1.4*  PROT 8.2* 7.1  ALBUMIN 4.7 4.0   Recent Labs  Lab 05/04/19 1633  LIPASE 36   No results for input(s): AMMONIA in the last 168 hours. Coagulation Profile: No results for input(s): INR, PROTIME in the last 168 hours. Cardiac Enzymes: No results for input(s): CKTOTAL, CKMB, CKMBINDEX, TROPONINI in the last 168 hours. BNP (last 3 results) No results for input(s): PROBNP in the last 8760 hours. HbA1C: No results for input(s): HGBA1C in the last 72 hours. CBG: Recent Labs  Lab 05/05/19 1717 05/05/19 2128 05/06/19 0003 05/06/19 0442 05/06/19 0745  GLUCAP 86 87 89 109* 118*   Lipid Profile: No results for input(s): CHOL, HDL, LDLCALC, TRIG, CHOLHDL, LDLDIRECT in the last 72 hours. Thyroid Function Tests: No results for input(s): TSH, T4TOTAL, FREET4, T3FREE, THYROIDAB in the last 72 hours. Anemia Panel: No results for input(s): VITAMINB12, FOLATE, FERRITIN, TIBC, IRON, RETICCTPCT in the last 72 hours. Sepsis Labs: No results for input(s): PROCALCITON, LATICACIDVEN in the last 168 hours.  Recent Results (from the past 240 hour(s))  SARS CORONAVIRUS 2 (TAT 6-24 HRS) Nasopharyngeal Nasopharyngeal Swab     Status: None   Collection Time: 05/04/19  8:47 PM   Specimen: Nasopharyngeal Swab  Result Value Ref Range Status   SARS Coronavirus 2 NEGATIVE NEGATIVE Final    Comment: (NOTE) SARS-CoV-2 target nucleic acids are NOT DETECTED. The SARS-CoV-2 RNA is generally detectable in upper and  lower respiratory specimens during the acute phase of infection. Negative results do not preclude SARS-CoV-2 infection, do not rule out co-infections with other pathogens, and should not be used as the sole basis for treatment or other patient management decisions. Negative results must be combined with clinical observations, patient history, and epidemiological information. The expected result is Negative. Fact Sheet for Patients: SugarRoll.be Fact Sheet for Healthcare Providers: https://www.woods-mathews.com/ This test is not yet approved or cleared by the [place] FDA and  has been authorized for detection and/or diagnosis of SARS-CoV-2 by FDA under an Emergency Use Authorization (EUA). This EUA will remain  in effect (meaning this test can be used) for the duration of the COVID-19 declaration under Section 56 4(b)(1) of the Act, 21 U.S.C. section 360bbb-3(b)(1), unless the authorization is terminated or revoked sooner. Performed at [facility], [place] [address] [place], [place] [address]          Radiology Studies: CT ABDOMEN PELVIS W CONTRAST  Result Date: 05/04/2019 CLINICAL DATA:  85 year old presenting with severe generalized abdominal pain that began approximately 1 week ago. Personal history of small-bowel obstruction due to adhesions. Personal history of a partial gastric resection. Personal history of at least partial esophagectomy due to esophageal cancer. EXAM: CT ABDOMEN AND PELVIS WITH CONTRAST TECHNIQUE: Multidetector CT imaging of the abdomen and pelvis was performed using the standard protocol  following bolus administration of intravenous contrast. CONTRAST:  193mL OMNIPAQUE IOHEXOL 300 MG/ML IV. COMPARISON:  10/19/2018 and earlier. FINDINGS: Lower chest: Scar and bronchiectasis involving the lower lobes, unchanged. Visualized lung bases otherwise clear. Normal appearing intrathoracic stomach filled with fluid.  Hepatobiliary: Liver normal in size and appearance. Numerous small gallstones in the gallbladder. No pericholecystic edema or inflammation. No biliary ductal dilation. Pancreas: Mildly atrophic without evidence of mass, ductal dilation or peripancreatic inflammation. Spleen: Normal in size and appearance. Adrenals/Urinary Tract: Normal appearing adrenal glands. Benign cortical cysts involving the LEFT kidney. No solid renal masses. No hydronephrosis. No urinary tract calculi. Diverticulum arising from the RIGHT posterolateral wall of the urinary bladder. Stomach/Bowel: The entire stomach is present within the chest in this patient with a prior esophagectomy. Multiple dilated loops of small bowel throughout the abdomen and pelvis, with an abrupt transition to normal caliber bowel in the mid abdomen near the midline. Moderate stool burden in the cecum and ascending colon. Remainder of the colon decompressed. Descending and sigmoid colon diverticulosis without evidence of acute diverticulitis. Normal appendix in the RIGHT UPPER pelvis. Vascular/Lymphatic: Severe aortoiliofemoral atherosclerosis without evidence of aneurysm. Visceral arteries atherosclerotic though patent. Normal-appearing portal venous and systemic venous systems. No pathologic lymphadenopathy. Reproductive: Moderate to marked prostate gland enlargement. Normal seminal vesicles. Other: No free intraperitoneal air. No ascites. Musculoskeletal: Mild degenerative disc disease at L3-4, L4-5 and L5-S1. Severe facet degenerative changes at these levels. Broad-based disc extrusion at L4-5 with severe multifactorial spinal stenosis at this level. Osseous demineralization. No acute findings. IMPRESSION: 1. Partial small bowel obstruction with an abrupt transition to normal caliber bowel in the mid abdomen near the midline. This may be due to an adhesion or a focal stenosis within the small bowel. 2. Cholelithiasis without evidence of acute cholecystitis. 3.  Descending and sigmoid colon diverticulosis without evidence of acute diverticulitis. 4. Moderate to marked prostate gland enlargement. 5. Broad-based disc extrusion at L4-5 with severe multifactorial spinal stenosis at this level. Aortic Atherosclerosis (ICD10-I70.0). Electronically Signed   By: Evangeline Dakin M.D.   On: 05/04/2019 18:47   DG Abd Portable 1V-Small Bowel Obstruction Protocol-initial, 8 hr delay  Result Date: 05/06/2019 CLINICAL DATA:  Small-bowel obstruction 8 hour delay EXAM: PORTABLE ABDOMEN - 1 VIEW COMPARISON:  05/05/2019 at 1:28 p.m. FINDINGS: Small bowel distension is less evident than on the prior study. There is mildly dilated small bowel in the upper abdomen. IMPRESSION: Mildly dilated upper abdominal small bowel, decreased compared to the earlier study. Electronically Signed   By: Ulyses Jarred M.D.   On: 05/06/2019 00:16   DG Abd Portable 1V-Small Bowel Protocol-Position Verification  Result Date: 05/05/2019 CLINICAL DATA:  85 year old male undergoing gastric tube placement EXAM: PORTABLE ABDOMEN - 1 VIEW COMPARISON:  Abdominal radiograph obtained earlier today FINDINGS: A gastric tube is seen coiled in the region of the esophageal hiatus. The tip does not pass inferior to the diaphragm. Multiple loops of distended and gas-filled small bowel present in the mid abdomen. The largest individual loop measures up to 6.2 cm in diameter. Patient has surgical changes of prior esophagectomy with gastric pull-through procedure. IMPRESSION: The gastric tube is coiled within the patulous stomach in the lower chest (surgical changes of prior esophagectomy with gastric pull-through). Persistent small bowel obstruction. Electronically Signed   By: Jacqulynn Cadet M.D.   On: 05/05/2019 14:23   DG Abd Portable 1V  Result Date: 05/05/2019 CLINICAL DATA:  Small bowel obstruction. EXAM: PORTABLE ABDOMEN - 1 VIEW COMPARISON:  10/24/2018 and CT 05/04/2019 FINDINGS: Exam demonstrates  continued evidence of a dilated small bowel loop over the mid abdomen measuring 5.6 cm in diameter. Air and stool present over the right colon. There is no free peritoneal air. Multiple surgical clips over the left upper quadrant. Contrast present over the bladder. Remainder of the exam is unchanged. IMPRESSION: Persistent air-filled dilated small bowel loops as seen on recent CT scan compatible with small bowel obstructive process. Electronically Signed   By: Marin Olp M.D.   On: 05/05/2019 05:23        Scheduled Meds: . enoxaparin (LOVENOX) injection  40 mg Subcutaneous QHS  . insulin aspart  0-9 Units Subcutaneous Q4H  . metoprolol tartrate  2.5 mg Intravenous Q8H   Continuous Infusions: . dextrose 5 % and 0.45% NaCl 75 mL/hr at 05/06/19 0600  . famotidine (PEPCID) IV 20 mg (05/06/19 0841)     LOS: 1 day   Time spent= 35 mins    Michaline Kindig Arsenio Loader, MD [facility]  If 7PM-7AM, please contact night-coverage  05/06/2019, 11:32 AM

## 2019-05-06 NOTE — Plan of Care (Signed)

## 2019-05-06 NOTE — Progress Notes (Signed)
Subjective/Chief Complaint: BELCHING Pt denies BM belching noted    Objective: Vital signs in last 24 hours: Temp:  [98 F (36.7 C)-98.3 F (36.8 C)] 98.3 F (36.8 C) (12/28 0445) Pulse Rate:  [64-79] 79 (12/28 0445) Resp:  [16-18] 16 (12/28 0445) BP: (135-162)/(76-84) 161/84 (12/28 0445) SpO2:  [97 %-100 %] 97 % (12/28 0445) Weight:  [83 kg] 83 kg (12/28 0445) Last BM Date: 05/05/19  Intake/Output from previous day: 12/27 0701 - 12/28 0700 In: 1230.5 [I.V.:1061.9; NG/GT:90; IV Piggyback:78.6] Out: 1750 [Urine:700; Emesis/NG output:1050] Intake/Output this shift: No intake/output data recorded.  Gen  NAD   Pulm  CTA  Abdomen soft NT mild distention     Lab Results:  Recent Labs    05/04/19 1633 05/05/19 0541  WBC 13.3* 11.4*  HGB 14.4 13.9  HCT 43.0 41.6  PLT 315 298   BMET Recent Labs    05/04/19 1633 05/05/19 0541  NA 134* 136  K 4.1 3.6  CL 92* 96*  CO2 29 30  GLUCOSE 138* 106*  BUN 19 16  CREATININE 1.15 1.17  CALCIUM 9.9 9.3   PT/INR No results for input(s): LABPROT, INR in the last 72 hours. ABG No results for input(s): PHART, HCO3 in the last 72 hours.  Invalid input(s): PCO2, PO2  Studies/Results: CT ABDOMEN PELVIS W CONTRAST  Result Date: 05/04/2019 CLINICAL DATA:  85 year old presenting with severe generalized abdominal pain that began approximately 1 week ago. Personal history of small-bowel obstruction due to adhesions. Personal history of a partial gastric resection. Personal history of at least partial esophagectomy due to esophageal cancer. EXAM: CT ABDOMEN AND PELVIS WITH CONTRAST TECHNIQUE: Multidetector CT imaging of the abdomen and pelvis was performed using the standard protocol following bolus administration of intravenous contrast. CONTRAST:  17mL OMNIPAQUE IOHEXOL 300 MG/ML IV. COMPARISON:  10/19/2018 and earlier. FINDINGS: Lower chest: Scar and bronchiectasis involving the lower lobes, unchanged. Visualized lung bases  otherwise clear. Normal appearing intrathoracic stomach filled with fluid. Hepatobiliary: Liver normal in size and appearance. Numerous small gallstones in the gallbladder. No pericholecystic edema or inflammation. No biliary ductal dilation. Pancreas: Mildly atrophic without evidence of mass, ductal dilation or peripancreatic inflammation. Spleen: Normal in size and appearance. Adrenals/Urinary Tract: Normal appearing adrenal glands. Benign cortical cysts involving the LEFT kidney. No solid renal masses. No hydronephrosis. No urinary tract calculi. Diverticulum arising from the RIGHT posterolateral wall of the urinary bladder. Stomach/Bowel: The entire stomach is present within the chest in this patient with a prior esophagectomy. Multiple dilated loops of small bowel throughout the abdomen and pelvis, with an abrupt transition to normal caliber bowel in the mid abdomen near the midline. Moderate stool burden in the cecum and ascending colon. Remainder of the colon decompressed. Descending and sigmoid colon diverticulosis without evidence of acute diverticulitis. Normal appendix in the RIGHT UPPER pelvis. Vascular/Lymphatic: Severe aortoiliofemoral atherosclerosis without evidence of aneurysm. Visceral arteries atherosclerotic though patent. Normal-appearing portal venous and systemic venous systems. No pathologic lymphadenopathy. Reproductive: Moderate to marked prostate gland enlargement. Normal seminal vesicles. Other: No free intraperitoneal air. No ascites. Musculoskeletal: Mild degenerative disc disease at L3-4, L4-5 and L5-S1. Severe facet degenerative changes at these levels. Broad-based disc extrusion at L4-5 with severe multifactorial spinal stenosis at this level. Osseous demineralization. No acute findings. IMPRESSION: 1. Partial small bowel obstruction with an abrupt transition to normal caliber bowel in the mid abdomen near the midline. This may be due to an adhesion or a focal stenosis within the  small bowel.  2. Cholelithiasis without evidence of acute cholecystitis. 3. Descending and sigmoid colon diverticulosis without evidence of acute diverticulitis. 4. Moderate to marked prostate gland enlargement. 5. Broad-based disc extrusion at L4-5 with severe multifactorial spinal stenosis at this level. Aortic Atherosclerosis (ICD10-I70.0). Electronically Signed   By: Evangeline Dakin M.D.   On: 05/04/2019 18:47   DG Abd Portable 1V-Small Bowel Obstruction Protocol-initial, 8 hr delay  Result Date: 05/06/2019 CLINICAL DATA:  Small-bowel obstruction 8 hour delay EXAM: PORTABLE ABDOMEN - 1 VIEW COMPARISON:  05/05/2019 at 1:28 p.m. FINDINGS: Small bowel distension is less evident than on the prior study. There is mildly dilated small bowel in the upper abdomen. IMPRESSION: Mildly dilated upper abdominal small bowel, decreased compared to the earlier study. Electronically Signed   By: Ulyses Jarred M.D.   On: 05/06/2019 00:16   DG Abd Portable 1V-Small Bowel Protocol-Position Verification  Result Date: 05/05/2019 CLINICAL DATA:  85 year old male undergoing gastric tube placement EXAM: PORTABLE ABDOMEN - 1 VIEW COMPARISON:  Abdominal radiograph obtained earlier today FINDINGS: A gastric tube is seen coiled in the region of the esophageal hiatus. The tip does not pass inferior to the diaphragm. Multiple loops of distended and gas-filled small bowel present in the mid abdomen. The largest individual loop measures up to 6.2 cm in diameter. Patient has surgical changes of prior esophagectomy with gastric pull-through procedure. IMPRESSION: The gastric tube is coiled within the patulous stomach in the lower chest (surgical changes of prior esophagectomy with gastric pull-through). Persistent small bowel obstruction. Electronically Signed   By: Jacqulynn Cadet M.D.   On: 05/05/2019 14:23   DG Abd Portable 1V  Result Date: 05/05/2019 CLINICAL DATA:  Small bowel obstruction. EXAM: PORTABLE ABDOMEN - 1 VIEW  COMPARISON:  10/24/2018 and CT 05/04/2019 FINDINGS: Exam demonstrates continued evidence of a dilated small bowel loop over the mid abdomen measuring 5.6 cm in diameter. Air and stool present over the right colon. There is no free peritoneal air. Multiple surgical clips over the left upper quadrant. Contrast present over the bladder. Remainder of the exam is unchanged. IMPRESSION: Persistent air-filled dilated small bowel loops as seen on recent CT scan compatible with small bowel obstructive process. Electronically Signed   By: Marin Olp M.D.   On: 05/05/2019 05:23    Anti-infectives: Anti-infectives (From admission, onward)   None      Assessment/Plan: SBO  Films look less distended  Continue NGT and repeat KUB in am  Pt has no desire for surgery at this point   Will follow    LOS: 1 day    Joyice Faster Deveron Shamoon 05/06/2019

## 2019-05-06 NOTE — Progress Notes (Signed)
Report given to RN on [place]

## 2019-05-07 ENCOUNTER — Inpatient Hospital Stay (HOSPITAL_COMMUNITY): Payer: Medicare Other

## 2019-05-07 LAB — CBC
HCT: 41.6 % (ref 39.0–52.0)
Hemoglobin: 13.8 g/dL (ref 13.0–17.0)
MCH: 32.2 pg (ref 26.0–34.0)
MCHC: 33.2 g/dL (ref 30.0–36.0)
MCV: 97 fL (ref 80.0–100.0)
Platelets: 298 10*3/uL (ref 150–400)
RBC: 4.29 MIL/uL (ref 4.22–5.81)
RDW: 11.7 % (ref 11.5–15.5)
WBC: 10.9 10*3/uL — ABNORMAL HIGH (ref 4.0–10.5)
nRBC: 0 % (ref 0.0–0.2)

## 2019-05-07 LAB — BASIC METABOLIC PANEL
Anion gap: 12 (ref 5–15)
BUN: 12 mg/dL (ref 8–23)
CO2: 27 mmol/L (ref 22–32)
Calcium: 9.2 mg/dL (ref 8.9–10.3)
Chloride: 97 mmol/L — ABNORMAL LOW (ref 98–111)
Creatinine, Ser: 1.12 mg/dL (ref 0.61–1.24)
GFR calc Af Amer: >60 mL/min (ref >60)
GFR calc non Af Amer: 60 mL/min — ABNORMAL LOW (ref >60)
Glucose, Bld: 137 mg/dL — ABNORMAL HIGH (ref 70–99)
Potassium: 4.1 mmol/L (ref 3.5–5.1)
Sodium: 136 mmol/L (ref 135–145)

## 2019-05-07 LAB — GLUCOSE, CAPILLARY
Glucose-Capillary: 102 mg/dL — ABNORMAL HIGH (ref 70–99)
Glucose-Capillary: 129 mg/dL — ABNORMAL HIGH (ref 70–99)
Glucose-Capillary: 133 mg/dL — ABNORMAL HIGH (ref 70–99)
Glucose-Capillary: 133 mg/dL — ABNORMAL HIGH (ref 70–99)
Glucose-Capillary: 139 mg/dL — ABNORMAL HIGH (ref 70–99)
Glucose-Capillary: 147 mg/dL — ABNORMAL HIGH (ref 70–99)
Glucose-Capillary: 152 mg/dL — ABNORMAL HIGH (ref 70–99)

## 2019-05-07 LAB — MAGNESIUM: Magnesium: 1.8 mg/dL (ref 1.7–2.4)

## 2019-05-07 MED ORDER — POLYETHYLENE GLYCOL 3350 17 G PO PACK
17.0000 g | PACK | Freq: Two times a day (BID) | ORAL | Status: DC
  Administered 2019-05-07 – 2019-05-09 (×5): 17 g via ORAL
  Filled 2019-05-07 (×5): qty 1

## 2019-05-07 MED ORDER — SIMETHICONE 80 MG PO CHEW
80.0000 mg | CHEWABLE_TABLET | Freq: Four times a day (QID) | ORAL | Status: DC | PRN
  Administered 2019-05-07 (×2): 80 mg via ORAL
  Filled 2019-05-07 (×2): qty 1

## 2019-05-07 MED ORDER — LACTULOSE 10 GM/15ML PO SOLN
20.0000 g | Freq: Two times a day (BID) | ORAL | Status: DC
  Administered 2019-05-07 – 2019-05-10 (×5): 20 g via ORAL
  Filled 2019-05-07 (×5): qty 30

## 2019-05-07 MED ORDER — BISACODYL 10 MG RE SUPP
10.0000 mg | Freq: Once | RECTAL | Status: AC
  Administered 2019-05-07: 10 mg via RECTAL
  Filled 2019-05-07: qty 1

## 2019-05-07 MED ORDER — LIDOCAINE 5 % EX PTCH
1.0000 | MEDICATED_PATCH | CUTANEOUS | Status: DC
  Administered 2019-05-07 – 2019-05-10 (×3): 1 via TRANSDERMAL
  Filled 2019-05-07 (×4): qty 1

## 2019-05-07 NOTE — Progress Notes (Signed)
Paged hospitalist Swayed, md that Pt c/o pain unrelieved, pt denies nausea, and says feels as if he has "gas buildup". Paged [facility] and spoke with Dr. On call Marlou Starks) says to leave NG out and to continue to ambulate pt and manage symptoms, and that more decisions will be made in the AM, after imaging. Will closely monitor pt. Prn medications given see mar. Pt is resting at this time and has ambulated multiple times in hallway today with minimal assistance. Call bell within reach.

## 2019-05-07 NOTE — Plan of Care (Signed)

## 2019-05-07 NOTE — Progress Notes (Signed)
[facility] Progress Note     Subjective: CC: sbo Patient reports empty feeling in stomach this AM but denies pain specifically. Reports he had a small BM about 1 hr ago. Denies nausea. Pulled NGT out overnight. Reports some back pain this AM.   Objective: Vital signs in last 24 hours: Temp:  [97.4 F (36.3 C)-98.1 F (36.7 C)] 97.9 F (36.6 C) (12/29 [id number]) Pulse Rate:  [61-84] 84 (12/29 0632) Resp:  [16-18] 16 (12/29 [id number]) BP: (118-178)/(58-89) 118/66 (12/29 [id number]) SpO2:  [94 %-99 %] 98 % (12/29 [id number]) Weight:  [79.4 kg] 79.4 kg (12/29 0434) Last BM Date: 05/05/19  Intake/Output from previous day: 12/28 0701 - 12/29 0700 In: 2041.3 [P.O.:60; I.V.:1681.3; NG/GT:250; IV Piggyback:50] Out: 1000 [Urine:300; Emesis/NG output:700] Intake/Output this shift: No intake/output data recorded.  PE: Gen:  Alert, NAD, pleasant Card:  Regular rate and rhythm Pulm:  Normal effort, clear to auscultation bilaterally Abd: Soft, non-tender, non-distended, +BS Skin: warm and dry, no rashes  Psych: A&Ox3   Lab Results:  Recent Labs    05/05/19 0541 05/07/19 0442  WBC 11.4* 10.9*  HGB 13.9 13.8  HCT 41.6 41.6  PLT 298 298   BMET Recent Labs    05/05/19 0541 05/07/19 0442  NA 136 136  K 3.6 4.1  CL 96* 97*  CO2 30 27  GLUCOSE 106* 137*  BUN 16 12  CREATININE 1.17 1.12  CALCIUM 9.3 9.2   PT/INR No results for input(s): LABPROT, INR in the last 72 hours. CMP     Component Value Date/Time   NA 136 05/07/2019 0442   K 4.1 05/07/2019 0442   CL 97 (L) 05/07/2019 0442   CO2 27 05/07/2019 0442   GLUCOSE 137 (H) 05/07/2019 0442   BUN 12 05/07/2019 0442   CREATININE 1.12 05/07/2019 0442   CREATININE 1.13 12/07/2012 1147   CALCIUM 9.2 05/07/2019 0442   PROT 7.1 05/05/2019 0541   ALBUMIN 4.0 05/05/2019 0541   AST 19 05/05/2019 0541   ALT 13 05/05/2019 0541   ALKPHOS 64 05/05/2019 0541   BILITOT 1.4 (H) 05/05/2019 0541   GFRNONAA 60 (L) 05/07/2019 0442   GFRAA  >60 05/07/2019 0442   Lipase     Component Value Date/Time   LIPASE 36 05/04/2019 1633       Studies/Results: DG Abd 1 View  Result Date: 05/07/2019 CLINICAL DATA:  Small bowel obstruction. EXAM: ABDOMEN - 1 VIEW COMPARISON:  Radiograph 05/05/2019, CT 05/04/2019 FINDINGS: Persistent dilated small bowel in the central abdomen. No significant change from most recent exam. Stool within the ascending colon. No visualized contrast in the colon. Multiple surgical clips in the left upper quadrant. No evidence of free air. IMPRESSION: Persistent dilated small bowel in the central abdomen, consistent with small bowel obstruction. Overall findings are not significantly changed from prior exam. Electronically Signed   By: Keith Rake M.D.   On: 05/07/2019 06:21   DG Abd Portable 1V-Small Bowel Obstruction Protocol-initial, 8 hr delay  Result Date: 05/06/2019 CLINICAL DATA:  Small-bowel obstruction 8 hour delay EXAM: PORTABLE ABDOMEN - 1 VIEW COMPARISON:  05/05/2019 at 1:28 p.m. FINDINGS: Small bowel distension is less evident than on the prior study. There is mildly dilated small bowel in the upper abdomen. IMPRESSION: Mildly dilated upper abdominal small bowel, decreased compared to the earlier study. Electronically Signed   By: Ulyses Jarred M.D.   On: 05/06/2019 00:16   DG Abd Portable 1V-Small Bowel Protocol-Position Verification  Result Date: 05/05/2019 CLINICAL  DATA:  85 year old male undergoing gastric tube placement EXAM: PORTABLE ABDOMEN - 1 VIEW COMPARISON:  Abdominal radiograph obtained earlier today FINDINGS: A gastric tube is seen coiled in the region of the esophageal hiatus. The tip does not pass inferior to the diaphragm. Multiple loops of distended and gas-filled small bowel present in the mid abdomen. The largest individual loop measures up to 6.2 cm in diameter. Patient has surgical changes of prior esophagectomy with gastric pull-through procedure. IMPRESSION: The gastric tube  is coiled within the patulous stomach in the lower chest (surgical changes of prior esophagectomy with gastric pull-through). Persistent small bowel obstruction. Electronically Signed   By: Jacqulynn Cadet M.D.   On: 05/05/2019 14:23    Anti-infectives: Anti-infectives (From admission, onward)   None       Assessment/Plan HTN HLD GERD T2DM - controlled with diet and exercise Hx of esophageal cancer s/p esophagectomy Hx of gout  SBO - film this AM with persistent small bowel dilatation - clinically patient is non-distended, non-tender and had a small BM - ok to leave NGT out for now - if patient becomes more distended or nauseated, may need NGT reinserted - ok to have some clears today, bowel regimen - mobilize - no indication for acute surgical intervention at this time  FEN: sips of clears, IVF VTE: SCDs, lovenox ID: no current abx  LOS: 2 days    Brigid Re , [facility] Surgery 05/07/2019, 7:43 AM Please see Amion for pager number during day hours 7:00am-4:30pm

## 2019-05-07 NOTE — Progress Notes (Signed)
PROGRESS NOTE  Oscar [name] [id number] DOB: [birth year] DOA: 05/04/2019 PCP: Deland Pretty, MD  Brief History   85 year old with history of HTN, DM 2, HLD, gout, BPH, GERD, esophageal cancer status post resection, small bowel obstruction-recurrent presented with abdominal pain found to have partial small bowel obstruction.  Initially did not want NG tube therefore conservative treatment was started.  General surgery consulted.  Pt accidentally removed NGT on the evening of 05/06/2019. Surgery stated to leave it out for now as patient appears to be improving.  Consultants  . General surgery  Procedures  . NGT placement  Antibiotics   Anti-infectives (From admission, onward)   None    .  Subjective  The patient states that he has been passing gas. He also thought that he had a BM this am, but he did not. No new complaints.  Objective   Vitals:  Vitals:   05/07/19 0805 05/07/19 1327  BP: 136/74 (!) 139/91  Pulse: 86 84  Resp:  17  Temp:  97.7 F (36.5 C)  SpO2: 96%    Exam:  Constitutional:  . The patient is awake, alert, and oriented x 3. No acute distress. Eyes:  . pupils and irises appear normal . Normal lids and conjunctivae ENMT:  . grossly normal hearing  . Lips appear normal . external ears, nose appear normal . Oropharynx: mucosa, tongue,posterior pharynx appear normal Neck:  . neck appears normal, no masses, normal ROM, supple . no thyromegaly Respiratory:  . No increased work of breathing. . No wheezes, rales, or rhonchi . No tactile fremitus Cardiovascular:  . Regular rate and rhythm . No murmurs, ectopy, or gallups. . No lateral PMI. No thrills. Abdomen:  . Abdomen is soft, non-tender, non-distended . No hernias, masses, or organomegaly . Normoactive bowel sounds.  Musculoskeletal:  . No cyanosis, clubbing, or edema Skin:  . No rashes, lesions, ulcers . palpation of skin: no induration or nodules Neurologic:  . CN 2-12  intact . Sensation all 4 extremities intact Psychiatric:  . Mental status o Mood, affect appropriate o Orientation to person, place, time  . judgment and insight appear intact  I have personally reviewed the following:   Today's Data  . Vitals, BMP, CBC  Micro Data  . Blood cultures x 2: No growth . Urine cultures: No growth  Scheduled Meds: . enoxaparin (LOVENOX) injection  40 mg Subcutaneous QHS  . insulin aspart  0-9 Units Subcutaneous Q4H  . lidocaine  1 patch Transdermal Q24H  . metoprolol tartrate  2.5 mg Intravenous Q8H  . polyethylene glycol  17 g Oral BID   Continuous Infusions: . dextrose 5 % and 0.45% NaCl 75 mL/hr at 05/07/19 1100  . famotidine (PEPCID) IV 20 mg (05/07/19 0805)    Principal Problem:   Partial small bowel obstruction (HCC) Active Problems:   HTN (hypertension)   SBO (small bowel obstruction) (HCC)   BPH (benign prostatic hyperplasia)   LOS: 2 days   A & P  Partial small bowel obstruction: NGT accidentally pulled out last night. Pt states that he is feeling a little better this morning. Surgery recommends leaving out at this point. Pt remains NPO. He is passing gas, but has not had a BM. He is receiving Supportive care, gentle hydration, antiemetics, and pain control.  Small bowel protocol in place. Volume status and electrolytes are being closely monitored. Conservative management. Mobilize.  Moderate to marked prostate enlargement: We will resume home medications once able to tolerate p.o.  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  Follow-up outpatient with primary care physician and neurosurgery.  No acute neuropathic symptoms/red flags.  Essential hypertension: Fair control on IV metoprolol and hydralazine. P.O. meds on hold.   GERD: PPI.  History of esophageal cancer status post resection: Follow-up outpatient.  I have seen and examined this patient myself. I have spent 34 minutes in his evaluation and  care.  DVT prophylaxis: Lovenox Code Status: Full Family Communication:   Disposition Plan: Maintain hospital stay until return of bowel function.  Unsafe for discharge.   Cynda Soule, DO [facility] Direct contact: see [email or web address]  7PM-7AM contact night coverage as above 05/07/2019, 4:34 PM  LOS: 2 days

## 2019-05-08 ENCOUNTER — Inpatient Hospital Stay (HOSPITAL_COMMUNITY): Payer: Medicare Other

## 2019-05-08 LAB — BASIC METABOLIC PANEL
Anion gap: 12 (ref 5–15)
BUN: 12 mg/dL (ref 8–23)
CO2: 23 mmol/L (ref 22–32)
Calcium: 9.1 mg/dL (ref 8.9–10.3)
Chloride: 99 mmol/L (ref 98–111)
Creatinine, Ser: 1.05 mg/dL (ref 0.61–1.24)
GFR calc Af Amer: >60 mL/min (ref >60)
GFR calc non Af Amer: >60 mL/min (ref >60)
Glucose, Bld: 147 mg/dL — ABNORMAL HIGH (ref 70–99)
Potassium: 3.7 mmol/L (ref 3.5–5.1)
Sodium: 134 mmol/L — ABNORMAL LOW (ref 135–145)

## 2019-05-08 LAB — GLUCOSE, CAPILLARY
Glucose-Capillary: 131 mg/dL — ABNORMAL HIGH (ref 70–99)
Glucose-Capillary: 116 mg/dL — ABNORMAL HIGH (ref 70–99)
Glucose-Capillary: 118 mg/dL — ABNORMAL HIGH (ref 70–99)
Glucose-Capillary: 121 mg/dL — ABNORMAL HIGH (ref 70–99)
Glucose-Capillary: 125 mg/dL — ABNORMAL HIGH (ref 70–99)
Glucose-Capillary: 94 mg/dL (ref 70–99)

## 2019-05-08 LAB — MAGNESIUM: Magnesium: 1.8 mg/dL (ref 1.7–2.4)

## 2019-05-08 LAB — CBC
HCT: 43.3 % (ref 39.0–52.0)
Hemoglobin: 14.2 g/dL (ref 13.0–17.0)
MCH: 31.6 pg (ref 26.0–34.0)
MCHC: 32.8 g/dL (ref 30.0–36.0)
MCV: 96.4 fL (ref 80.0–100.0)
Platelets: 299 10*3/uL (ref 150–400)
RBC: 4.49 MIL/uL (ref 4.22–5.81)
RDW: 11.6 % (ref 11.5–15.5)
WBC: 8.9 10*3/uL (ref 4.0–10.5)
nRBC: 0 % (ref 0.0–0.2)

## 2019-05-08 LAB — LACTIC ACID, PLASMA
Lactic Acid, Venous: 1.5 mmol/L (ref 0.5–1.9)
Lactic Acid, Venous: 1 mmol/L (ref 0.5–1.9)

## 2019-05-08 MED ORDER — PANTOPRAZOLE SODIUM 40 MG IV SOLR
40.0000 mg | Freq: Two times a day (BID) | INTRAVENOUS | Status: DC
  Administered 2019-05-08 – 2019-05-10 (×5): 40 mg via INTRAVENOUS
  Filled 2019-05-08 (×5): qty 40

## 2019-05-08 NOTE — Plan of Care (Signed)

## 2019-05-08 NOTE — Progress Notes (Signed)
PROGRESS NOTE  Oscar [name] [id number] DOB: 1932/07/06 DOA: 05/04/2019 PCP: Deland Pretty, MD  Brief History   85 year old with history of HTN, DM 2, HLD, gout, BPH, GERD, esophageal cancer status post resection, small bowel obstruction-recurrent presented with abdominal pain found to have partial small bowel obstruction.  Initially did not want NG tube therefore conservative treatment was started.  General surgery consulted.  Pt accidentally removed NGT on the evening of 05/06/2019. Pt had more discomfort and vomiting overnight. General surgery has replaced the NGT. The patient may require exploratory laparoscopy if he does not improve in the next 24-48 hours. Consultants  . General surgery  Procedures  . NGT placement x2  Antibiotics   Anti-infectives (From admission, onward)   None     Subjective  The patient has been very uncomfortable overnight. He has had emesis x 3. He states that he is passing gas, although he remains very distended.  Objective   Vitals:  Vitals:   05/08/19 0957 05/08/19 1420  BP: 119/70 129/64  Pulse: 90 80  Resp: 19 16  Temp: 98.5 F (36.9 C) 98 F (36.7 C)  SpO2: 99% 97%   Exam:  Constitutional:  . The patient is awake, alert, and oriented x 3. Mild distress from distention. Respiratory:  . No increased work of breathing. . No wheezes, rales, or rhonchi . No tactile fremitus Cardiovascular:  . Regular rate and rhythm . No murmurs, ectopy, or gallups. . No lateral PMI. No thrills. Abdomen:  . Abdomen is very distended and diffusely tender.  . No hernias, masses, or organomegaly . Hypoactive bowel sounds. Musculoskeletal:  . No cyanosis, clubbing, or edema Skin:  . No rashes, lesions, ulcers . palpation of skin: no induration or nodules Neurologic:  . CN 2-12 intact . Sensation all 4 extremities intact Psychiatric:  . Mental status o Mood, affect appropriate o Orientation to person, place, time  . judgment and insight  appear intact  I have personally reviewed the following:   Today's Data  . Vitals, BMP, CBC  Micro Data  . Blood cultures x 2: No growth . Urine cultures: No growth  Scheduled Meds: . enoxaparin (LOVENOX) injection  40 mg Subcutaneous QHS  . insulin aspart  0-9 Units Subcutaneous Q4H  . lactulose  20 g Oral BID  . lidocaine  1 patch Transdermal Q24H  . metoprolol tartrate  2.5 mg Intravenous Q8H  . pantoprazole (PROTONIX) IV  40 mg Intravenous Q12H  . polyethylene glycol  17 g Oral BID   Continuous Infusions: . dextrose 5 % and 0.45% NaCl 75 mL/hr at 05/08/19 0602  . famotidine (PEPCID) IV 20 mg (05/08/19 1020)    Principal Problem:   Partial small bowel obstruction (HCC) Active Problems:   HTN (hypertension)   SBO (small bowel obstruction) (HCC)   BPH (benign prostatic hyperplasia)   LOS: 3 days   A & P  Partial small bowel obstruction: NGT replaced today be general surgery due to increased distention and vomiting. Pt remains NPO. He is states that he is passing gas, but has not had a BM. He is receiving Supportive care, gentle hydration, antiemetics, and pain control.  Small bowel protocol in place. Volume status and electrolytes are being closely monitored. Per general surgery the patient may require exploratory laparoscopy if he does not improve over the next 24 - 48 hours.Mobilize.  Moderate to marked prostate enlargement: We will resume home medications once able to tolerate p.o.  Multifocal spinal stenosis with disc protrusion  L4-L5: Pain control.  Follow-up outpatient with primary care physician and neurosurgery.  No acute neuropathic symptoms/red flags.  Essential hypertension: Fair control on IV metoprolol and hydralazine. P.O. meds on hold.   GERD: PPI.  History of esophageal cancer status post resection: Follow-up outpatient.  I have seen and examined this patient myself. I have spent 32 minutes in his evaluation and care.  DVT prophylaxis:  Lovenox Code Status: Full Family Communication:   Disposition Plan: Maintain hospital stay until return of bowel function.  Unsafe for discharge.   Aryah Doering, DO [facility] Direct contact: see [email or web address]  7PM-7AM contact night coverage as above 05/08/2019, 4:48 PM  LOS: 2 days

## 2019-05-08 NOTE — Progress Notes (Signed)
Per M. Sharlet Salina, leave NG tube out for now and continue to monitor.

## 2019-05-08 NOTE — Progress Notes (Signed)
Patient vomiting. Paged floor coverage regarding reinsertion of NG tube.

## 2019-05-08 NOTE — Progress Notes (Signed)
[facility]/Trauma Progress Note      Assessment/Plan HTN HLD GERD T2DM - controlled with diet and exercise Hx of esophageal cancer s/p esophagectomy Hx of gout  SBO - film this AM with persistent PSBO - clinically patient is non-distended, non-tender, and has good BS  - replace NGT if pt vomits again - mobilize - if pt does not open up in the next day or so he may need surgery - emesis overnight pt states was black, concerns for UGI bleed but Hgb is stable at 14.2, added protonix IV  FEN: NPO, IVF VTE: SCDs, lovenox ID: no current abx Foley: none Follow up: TBD  Plan: emesis overnight but no nausea this am. Hold off on NGT unless he vomits again. Pt may need surgery if he does not open up.    LOS: 3 days    Subjective: CC: no complaints  Pt states he feels better today. He had about 8 episodes of black emesis last night. He continues to have belching. No flatus or BM but he feels like he needs to have a BM. Pt states intermittent black stools prior to admission. No abdominal pain.   Objective: Vital signs in last 24 hours: Temp:  [97.7 F (36.5 C)-99.1 F (37.3 C)] 98.4 F (36.9 C) (12/30 0536) Pulse Rate:  [84-109] 109 (12/30 0536) Resp:  [17-18] 18 (12/30 0536) BP: (138-172)/(84-91) 138/84 (12/30 0536) SpO2:  [97 %-98 %] 98 % (12/30 0536) Weight:  [78.3 kg] 78.3 kg (12/30 0536) Last BM Date: 05/05/19  Intake/Output from previous day: 12/29 0701 - 12/30 0700 In: 1615.2 [P.O.:520; I.V.:1045.2; IV Piggyback:50] Out: 900 [Emesis/NG output:900] Intake/Output this shift: No intake/output data recorded.  PE:  Gen:  Alert, NAD, pleasant, cooperative Pulm:  Rate and effort normal Abd: Soft, NT/ND, +BS, no HSM, midline scar noted Skin: no rashes noted, warm and dry   Anti-infectives: Anti-infectives (From admission, onward)   None      Lab Results:  Recent Labs    05/07/19 0442 05/08/19 0510  WBC 10.9* 8.9  HGB 13.8 14.2  HCT 41.6  43.3  PLT 298 299   BMET Recent Labs    05/07/19 0442 05/08/19 0510  NA 136 134*  K 4.1 3.7  CL 97* 99  CO2 27 23  GLUCOSE 137* 147*  BUN 12 12  CREATININE 1.12 1.05  CALCIUM 9.2 9.1   PT/INR No results for input(s): LABPROT, INR in the last 72 hours. CMP     Component Value Date/Time   NA 134 (L) 05/08/2019 0510   K 3.7 05/08/2019 0510   CL 99 05/08/2019 0510   CO2 23 05/08/2019 0510   GLUCOSE 147 (H) 05/08/2019 0510   BUN 12 05/08/2019 0510   CREATININE 1.05 05/08/2019 0510   CREATININE 1.13 12/07/2012 1147   CALCIUM 9.1 05/08/2019 0510   PROT 7.1 05/05/2019 0541   ALBUMIN 4.0 05/05/2019 0541   AST 19 05/05/2019 0541   ALT 13 05/05/2019 0541   ALKPHOS 64 05/05/2019 0541   BILITOT 1.4 (H) 05/05/2019 0541   GFRNONAA >60 05/08/2019 0510   GFRAA >60 05/08/2019 0510   Lipase     Component Value Date/Time   LIPASE 36 05/04/2019 1633    Studies/Results: DG Abd 1 View  Result Date: 05/07/2019 CLINICAL DATA:  Small bowel obstruction. EXAM: ABDOMEN - 1 VIEW COMPARISON:  Radiograph 05/05/2019, CT 05/04/2019 FINDINGS: Persistent dilated small bowel in the central abdomen. No significant change from most recent exam. Stool within the ascending  colon. No visualized contrast in the colon. Multiple surgical clips in the left upper quadrant. No evidence of free air. IMPRESSION: Persistent dilated small bowel in the central abdomen, consistent with small bowel obstruction. Overall findings are not significantly changed from prior exam. Electronically Signed   By: Keith Rake M.D.   On: 05/07/2019 06:21   DG Abd Portable 1V  Result Date: 05/08/2019 CLINICAL DATA:  Partial small bowel obstruction EXAM: PORTABLE ABDOMEN - 1 VIEW COMPARISON:  05/07/2019 FINDINGS: Prominent small bowel loops again noted in the abdomen. No real change since prior study. No organomegaly or free air. IMPRESSION: Persistent dilated small bowel loops compatible with partial small bowel obstruction,  unchanged. Electronically Signed   By: Rolm Baptise M.D.   On: 05/08/2019 08:41     Kalman Drape, [facility] Surgery Please see amion for pager for the following: Cristine Polio, & Friday 7:00am - 4:30pm Thursdays 7:00am -11:30am

## 2019-05-08 NOTE — Progress Notes (Signed)
Updated M. Sharlet Salina that patient had 2 more incidences of emesis since last page and that patient was currently sleeping and was advised to update day shift of patient condition.

## 2019-05-08 NOTE — Care Management Important Message (Signed)
Important Message  Patient Details IM Letter given to Velva Harman RN Case Manager to present to the Patient Name: Oscar [name] MRN: [id number] Date of Birth: [birth year]   Medicare Important Message Given:  Yes     Kerin Salen 05/08/2019, 1:19 PM

## 2019-05-09 ENCOUNTER — Inpatient Hospital Stay (HOSPITAL_COMMUNITY): Payer: Medicare Other

## 2019-05-09 DIAGNOSIS — K56609 Unspecified intestinal obstruction, unspecified as to partial versus complete obstruction: Secondary | ICD-10-CM

## 2019-05-09 LAB — CBC WITH DIFFERENTIAL/PLATELET
Abs Immature Granulocytes: 0.02 10*3/uL (ref 0.00–0.07)
Basophils Absolute: 0 10*3/uL (ref 0.0–0.1)
Basophils Relative: 0 %
Eosinophils Absolute: 0.1 10*3/uL (ref 0.0–0.5)
Eosinophils Relative: 2 %
HCT: 37.6 % — ABNORMAL LOW (ref 39.0–52.0)
Hemoglobin: 12.2 g/dL — ABNORMAL LOW (ref 13.0–17.0)
Immature Granulocytes: 0 %
Lymphocytes Relative: 33 %
Lymphs Abs: 2.2 10*3/uL (ref 0.7–4.0)
MCH: 31.9 pg (ref 26.0–34.0)
MCHC: 32.4 g/dL (ref 30.0–36.0)
MCV: 98.4 fL (ref 80.0–100.0)
Monocytes Absolute: 0.9 10*3/uL (ref 0.1–1.0)
Monocytes Relative: 14 %
Neutro Abs: 3.4 10*3/uL (ref 1.7–7.7)
Neutrophils Relative %: 51 %
Platelets: 260 10*3/uL (ref 150–400)
RBC: 3.82 MIL/uL — ABNORMAL LOW (ref 4.22–5.81)
RDW: 11.7 % (ref 11.5–15.5)
WBC: 6.7 10*3/uL (ref 4.0–10.5)
nRBC: 0 % (ref 0.0–0.2)

## 2019-05-09 LAB — COMPREHENSIVE METABOLIC PANEL
ALT: 18 U/L (ref 0–44)
AST: 20 U/L (ref 15–41)
Albumin: 3.3 g/dL — ABNORMAL LOW (ref 3.5–5.0)
Alkaline Phosphatase: 45 U/L (ref 38–126)
Anion gap: 9 (ref 5–15)
BUN: 13 mg/dL (ref 8–23)
CO2: 25 mmol/L (ref 22–32)
Calcium: 8.4 mg/dL — ABNORMAL LOW (ref 8.9–10.3)
Chloride: 101 mmol/L (ref 98–111)
Creatinine, Ser: 1.05 mg/dL (ref 0.61–1.24)
GFR calc Af Amer: >60 mL/min (ref >60)
GFR calc non Af Amer: >60 mL/min (ref >60)
Glucose, Bld: 107 mg/dL — ABNORMAL HIGH (ref 70–99)
Potassium: 3.6 mmol/L (ref 3.5–5.1)
Sodium: 135 mmol/L (ref 135–145)
Total Bilirubin: 0.9 mg/dL (ref 0.3–1.2)
Total Protein: 5.8 g/dL — ABNORMAL LOW (ref 6.5–8.1)

## 2019-05-09 LAB — GLUCOSE, CAPILLARY
Glucose-Capillary: 109 mg/dL — ABNORMAL HIGH (ref 70–99)
Glucose-Capillary: 110 mg/dL — ABNORMAL HIGH (ref 70–99)
Glucose-Capillary: 113 mg/dL — ABNORMAL HIGH (ref 70–99)
Glucose-Capillary: 118 mg/dL — ABNORMAL HIGH (ref 70–99)
Glucose-Capillary: 122 mg/dL — ABNORMAL HIGH (ref 70–99)
Glucose-Capillary: 98 mg/dL (ref 70–99)

## 2019-05-09 LAB — MAGNESIUM: Magnesium: 1.8 mg/dL (ref 1.7–2.4)

## 2019-05-09 NOTE — Progress Notes (Signed)
   Subjective/Chief Complaint: States passed flatus and had a couple bms, feels much better   Objective: Vital signs in last 24 hours: Temp:  [98.1 F (36.7 C)-98.6 F (37 C)] 98.6 F (37 C) (12/31 1406) Pulse Rate:  [64-78] 64 (12/31 1406) Resp:  [14-20] 18 (12/31 1406) BP: (127-142)/(62-71) 136/62 (12/31 1406) SpO2:  [95 %-100 %] 100 % (12/31 1406) Last BM Date: 05/08/19  Intake/Output from previous day: 12/30 0701 - 12/31 0700 In: 1911.6 [P.O.:30; I.V.:1731.6; IV Piggyback:150] Out: 2450 [Urine:250; Emesis/NG [id number] Intake/Output this shift: Total I/O In: -  Out: 500 [Urine:500]  Ab soft nontender incision healed has bs today  Lab Results:  Recent Labs    05/08/19 0510 05/09/19 0458  WBC 8.9 6.7  HGB 14.2 12.2*  HCT 43.3 37.6*  PLT 299 260   BMET Recent Labs    05/08/19 0510 05/09/19 0458  NA 134* 135  K 3.7 3.6  CL 99 101  CO2 23 25  GLUCOSE 147* 107*  BUN 12 13  CREATININE 1.05 1.05  CALCIUM 9.1 8.4*   PT/INR No results for input(s): LABPROT, INR in the last 72 hours. ABG No results for input(s): PHART, HCO3 in the last 72 hours.  Invalid input(s): PCO2, PO2  Studies/Results: DG Abd 1 View  Result Date: 05/09/2019 CLINICAL DATA:  85 year old male with history of abdominal pain. EXAM: ABDOMEN - 1 VIEW COMPARISON:  05/08/2019. FINDINGS: Gas and stool noted throughout the colon extending to the level of the distal rectum. No pathologic dilatation of small bowel. No gross evidence of pneumoperitoneum on this single supine view. Multiple pelvic phleboliths are incidentally noted. IMPRESSION: 1. Nonobstructive bowel gas pattern. 2. No pneumoperitoneum. Electronically Signed   By: Vinnie Langton M.D.   On: 05/09/2019 10:02   DG Abd 1 View  Result Date: 05/08/2019 CLINICAL DATA:  NG tube placement EXAM: ABDOMEN - 1 VIEW COMPARISON:  None. FINDINGS: Nasogastric tube with the tip above the esophagogastric junction. Recommend advancing the tube  11 cm. Persistent gaseous distension of the small bowel and colon. There is no evidence of pneumoperitoneum, portal venous gas or pneumatosis. There are no pathologic calcifications along the expected course of the ureters. The osseous structures are unremarkable. IMPRESSION: Nasogastric tube with the tip above the esophagogastric junction. Recommend advancing the tube 11 cm. Electronically Signed   By: Kathreen Devoid   On: 05/08/2019 13:37   DG Abd Portable 1V  Result Date: 05/08/2019 CLINICAL DATA:  Partial small bowel obstruction EXAM: PORTABLE ABDOMEN - 1 VIEW COMPARISON:  05/07/2019 FINDINGS: Prominent small bowel loops again noted in the abdomen. No real change since prior study. No organomegaly or free air. IMPRESSION: Persistent dilated small bowel loops compatible with partial small bowel obstruction, unchanged. Electronically Signed   By: Rolm Baptise M.D.   On: 05/08/2019 08:41    Anti-infectives: Anti-infectives (From admission, onward)   None      Assessment/Plan: SBO\ - clinically patient is non-distended, non-tender, and has good BS  - will dc ng has never really been functional I dont think - mobilize - clear liquids and will see how he does I am encouraged by his progress today and hopefully will avoid surgery -if does well advance tomorrow and dc home FEN: clears, ivf VTE: SCDs, lovenox ID: no current abx Foley: none Follow up: TBD  Rolm Bookbinder 05/09/2019

## 2019-05-09 NOTE — Progress Notes (Signed)
PROGRESS NOTE    MARIK DARLAND  [id number] DOB: 1932-06-11 DOA: 05/04/2019 PCP: Deland Pretty, MD     Brief Narrative:   85 year old male with prior h/o hypertension, hyperlipidemia, DM, BPH, GERD, gout, esophageal ca s/p resection, presents with recurrent SBO. General surgery consulted and recommended NG tube placement which was removed by the patient.    Assessment & Plan:   Principal Problem:   Partial small bowel obstruction (HCC) Active Problems:   HTN (hypertension)   SBO (small bowel obstruction) (HCC)   BPH (benign prostatic hyperplasia)   PARTIAL RECURRENT SBO:  NGT was removed by the patient.  Currently passing flatulence as per the patient. abd film shows Nonobstructive bowel gas pattern. No pneumoperitoneum. Per gen surgery, the patient may require exp lap if no improvement.  Await recommendations from gen surgery.    Hypertension: well controlled.    BPH;  Stable.    DVT prophylaxis: Lovenox.  Code Status:full code.  Family Communication: none at bedside.  Disposition Plan: pending clinical improvement.   Consultants:   General surgery.   Procedures: none.  Antimicrobials: none.   Subjective: No new complaints, wants to eat. No nausea or vomiting last night as per the patient.  No flatus.   Objective: Vitals:   05/08/19 0957 05/08/19 1420 05/08/19 2151 05/09/19 0538  BP: 119/70 129/64 (!) 142/71 127/68  Pulse: 90 80 78 75  Resp: 19 16 14 20   Temp: 98.5 F (36.9 C) 98 F (36.7 C) 98.2 F (36.8 C) 98.1 F (36.7 C)  TempSrc: Oral Oral Oral Oral  SpO2: 99% 97% 99% 95%  Weight:      Height:        Intake/Output Summary (Last 24 hours) at 05/09/2019 1312 Last data filed at 05/09/2019 1032 Gross per 24 hour  Intake 1911.58 ml  Output 1650 ml  Net 261.58 ml   Filed Weights   05/06/19 0445 05/07/19 0434 05/08/19 0536  Weight: 83 kg 79.4 kg 78.3 kg    Examination:  General exam: Appears calm and comfortable  Respiratory  system: Clear to auscultation. Respiratory effort normal. Cardiovascular system: S1 & S2 heard, RRR.No pedal edema. Gastrointestinal system: Abdomen is distended,soft, non tender. Normal bowel sounds heard. Central nervous system: Alert and oriented. No focal neurological deficits. Extremities: Symmetric 5 x 5 power. Skin: No rashes, lesions or ulcers Psychiatry:  Mood & affect appropriate.     Data Reviewed: I have personally reviewed following labs and imaging studies  CBC: Recent Labs  Lab 05/04/19 1633 05/05/19 0541 05/07/19 0442 05/08/19 0510 05/09/19 0458  WBC 13.3* 11.4* 10.9* 8.9 6.7  NEUTROABS 12.0*  --   --   --  3.4  HGB 14.4 13.9 13.8 14.2 12.2*  HCT 43.0 41.6 41.6 43.3 37.6*  MCV 95.8 96.7 97.0 96.4 98.4  PLT 315 298 298 299 [id number]   Basic Metabolic Panel: Recent Labs  Lab 05/04/19 1633 05/05/19 0541 05/07/19 0442 05/08/19 0510 05/09/19 0458  NA 134* 136 136 134* 135  K 4.1 3.6 4.1 3.7 3.6  CL 92* 96* 97* 99 101  CO2 29 30 27 23 25   GLUCOSE 138* 106* 137* 147* 107*  BUN 19 16 12 12 13   CREATININE 1.15 1.17 1.12 1.05 1.05  CALCIUM 9.9 9.3 9.2 9.1 8.4*  MG  --   --  1.8 1.8 1.8   GFR: Estimated Creatinine Clearance: 58 mL/min (by C-G formula based on SCr of 1.05 mg/dL). Liver Function Tests: Recent Labs  Lab  05/04/19 1633 05/05/19 0541 05/09/19 0458  AST 20 19 20   ALT 14 13 18   ALKPHOS 79 64 45  BILITOT 1.1 1.4* 0.9  PROT 8.2* 7.1 5.8*  ALBUMIN 4.7 4.0 3.3*   Recent Labs  Lab 05/04/19 1633  LIPASE 36   No results for input(s): AMMONIA in the last 168 hours. Coagulation Profile: No results for input(s): INR, PROTIME in the last 168 hours. Cardiac Enzymes: No results for input(s): CKTOTAL, CKMB, CKMBINDEX, TROPONINI in the last 168 hours. BNP (last 3 results) No results for input(s): PROBNP in the last 8760 hours. HbA1C: No results for input(s): HGBA1C in the last 72 hours. CBG: Recent Labs  Lab 05/08/19 2120 05/08/19 2327 05/09/19  0402 05/09/19 0751 05/09/19 1159  GLUCAP 125* 94 98 109* 118*   Lipid Profile: No results for input(s): CHOL, HDL, LDLCALC, TRIG, CHOLHDL, LDLDIRECT in the last 72 hours. Thyroid Function Tests: No results for input(s): TSH, T4TOTAL, FREET4, T3FREE, THYROIDAB in the last 72 hours. Anemia Panel: No results for input(s): VITAMINB12, FOLATE, FERRITIN, TIBC, IRON, RETICCTPCT in the last 72 hours. Sepsis Labs: Recent Labs  Lab 05/08/19 1909 05/08/19 2110  LATICACIDVEN 1.5 1.0    Recent Results (from the past 240 hour(s))  SARS CORONAVIRUS 2 (TAT 6-24 HRS) Nasopharyngeal Nasopharyngeal Swab     Status: None   Collection Time: 05/04/19  8:47 PM   Specimen: Nasopharyngeal Swab  Result Value Ref Range Status   SARS Coronavirus 2 NEGATIVE NEGATIVE Final    Comment: (NOTE) SARS-CoV-2 target nucleic acids are NOT DETECTED. The SARS-CoV-2 RNA is generally detectable in upper and lower respiratory specimens during the acute phase of infection. Negative results do not preclude SARS-CoV-2 infection, do not rule out co-infections with other pathogens, and should not be used as the sole basis for treatment or other patient management decisions. Negative results must be combined with clinical observations, patient history, and epidemiological information. The expected result is Negative. Fact Sheet for Patients: SugarRoll.be Fact Sheet for Healthcare Providers: https://www.woods-mathews.com/ This test is not yet approved or cleared by the [place] FDA and  has been authorized for detection and/or diagnosis of SARS-CoV-2 by FDA under an Emergency Use Authorization (EUA). This EUA will remain  in effect (meaning this test can be used) for the duration of the COVID-19 declaration under Section 56 4(b)(1) of the Act, 21 U.S.C. section 360bbb-3(b)(1), unless the authorization is terminated or revoked sooner. Performed at [facility],  [facility] [address], [place], [place] [address]          Radiology Studies: DG Abd 1 View  Result Date: 05/09/2019 CLINICAL DATA:  85 year old male with history of abdominal pain. EXAM: ABDOMEN - 1 VIEW COMPARISON:  05/08/2019. FINDINGS: Gas and stool noted throughout the colon extending to the level of the distal rectum. No pathologic dilatation of small bowel. No gross evidence of pneumoperitoneum on this single supine view. Multiple pelvic phleboliths are incidentally noted. IMPRESSION: 1. Nonobstructive bowel gas pattern. 2. No pneumoperitoneum. Electronically Signed   By: Vinnie Langton M.D.   On: 05/09/2019 10:02   DG Abd 1 View  Result Date: 05/08/2019 CLINICAL DATA:  NG tube placement EXAM: ABDOMEN - 1 VIEW COMPARISON:  None. FINDINGS: Nasogastric tube with the tip above the esophagogastric junction. Recommend advancing the tube 11 cm. Persistent gaseous distension of the small bowel and colon. There is no evidence of pneumoperitoneum, portal venous gas or pneumatosis. There are no pathologic calcifications along the expected course of the ureters.  The osseous structures are unremarkable. IMPRESSION: Nasogastric tube with the tip above the esophagogastric junction. Recommend advancing the tube 11 cm. Electronically Signed   By: Kathreen Devoid   On: 05/08/2019 13:37   DG Abd Portable 1V  Result Date: 05/08/2019 CLINICAL DATA:  Partial small bowel obstruction EXAM: PORTABLE ABDOMEN - 1 VIEW COMPARISON:  05/07/2019 FINDINGS: Prominent small bowel loops again noted in the abdomen. No real change since prior study. No organomegaly or free air. IMPRESSION: Persistent dilated small bowel loops compatible with partial small bowel obstruction, unchanged. Electronically Signed   By: Rolm Baptise M.D.   On: 05/08/2019 08:41        Scheduled Meds: . enoxaparin (LOVENOX) injection  40 mg Subcutaneous QHS  . insulin aspart  0-9 Units Subcutaneous Q4H  . lactulose  20 g Oral BID  . lidocaine  1  patch Transdermal Q24H  . metoprolol tartrate  2.5 mg Intravenous Q8H  . pantoprazole (PROTONIX) IV  40 mg Intravenous Q12H  . polyethylene glycol  17 g Oral BID   Continuous Infusions: . dextrose 5 % and 0.45% NaCl 75 mL/hr at 05/09/19 1218  . famotidine (PEPCID) IV 20 mg (05/09/19 0951)     LOS: 4 days        Hosie Poisson, MD [facility]

## 2019-05-09 NOTE — Plan of Care (Signed)
Continue current POC 

## 2019-05-10 LAB — BASIC METABOLIC PANEL
Anion gap: 6 (ref 5–15)
BUN: 9 mg/dL (ref 8–23)
CO2: 28 mmol/L (ref 22–32)
Calcium: 8.7 mg/dL — ABNORMAL LOW (ref 8.9–10.3)
Chloride: 104 mmol/L (ref 98–111)
Creatinine, Ser: 1.12 mg/dL (ref 0.61–1.24)
GFR calc Af Amer: >60 mL/min (ref >60)
GFR calc non Af Amer: 60 mL/min — ABNORMAL LOW (ref >60)
Glucose, Bld: 82 mg/dL (ref 70–99)
Potassium: 3.9 mmol/L (ref 3.5–5.1)
Sodium: 138 mmol/L (ref 135–145)

## 2019-05-10 LAB — CBC
HCT: 33.3 % — ABNORMAL LOW (ref 39.0–52.0)
Hemoglobin: 10.8 g/dL — ABNORMAL LOW (ref 13.0–17.0)
MCH: 31.6 pg (ref 26.0–34.0)
MCHC: 32.4 g/dL (ref 30.0–36.0)
MCV: 97.4 fL (ref 80.0–100.0)
Platelets: 241 10*3/uL (ref 150–400)
RBC: 3.42 MIL/uL — ABNORMAL LOW (ref 4.22–5.81)
RDW: 11.5 % (ref 11.5–15.5)
WBC: 6.4 10*3/uL (ref 4.0–10.5)
nRBC: 0 % (ref 0.0–0.2)

## 2019-05-10 LAB — GLUCOSE, CAPILLARY
Glucose-Capillary: 69 mg/dL — ABNORMAL LOW (ref 70–99)
Glucose-Capillary: 106 mg/dL — ABNORMAL HIGH (ref 70–99)
Glucose-Capillary: 93 mg/dL (ref 70–99)
Glucose-Capillary: 108 mg/dL — ABNORMAL HIGH (ref 70–99)

## 2019-05-10 LAB — MAGNESIUM: Magnesium: 1.7 mg/dL (ref 1.7–2.4)

## 2019-05-10 NOTE — Discharge Summary (Signed)
Physician Discharge Summary  Oscar [name] [id number] DOB: May 16, 1932 DOA: 05/04/2019  PCP: Deland Pretty, MD  Admit date: 05/04/2019 Discharge date: 05/10/2019  Admitted From: Home.  Disposition:  Home   Recommendations for Outpatient Follow-up:  1. Follow up with PCP in 1-2 weeks 2. Please obtain BMP/CBC in one week  Discharge Condition:stable.  CODE STATUS: full code.  Diet recommendation: Heart Healthy / soft diet.    Brief/Interim Summary: 86 year old male with prior h/o hypertension, hyperlipidemia, DM, BPH, GERD, gout, esophageal ca s/p resection, presents with recurrent SBO. General surgery consulted and recommended NG tube placement which was removed by the patient.  Pt started having bowel movements and pass flatus. He was started on clears and advanced to soft diet without any nausea,vomiting or abdominal pain. He was cleared by surgery for discharge.    Discharge Diagnoses:  Principal Problem:   Partial small bowel obstruction (HCC) Active Problems:   HTN (hypertension)   SBO (small bowel obstruction) (HCC)   BPH (benign prostatic hyperplasia)  PARTIAL RECURRENT SBO:  NGT was removed by the patient.  Currently passing flatulence ad having bowel movements as per the patient. abd film shows Nonobstructive bowel gas pattern. No pneumoperitoneum. Started on clears and advanced as tolerated . He is soft diet and able to tolerate. .    Hypertension: well controlled.    BPH;  Stable.   Discharge Instructions  Discharge Instructions    Diet - low sodium heart healthy   Complete by: As directed    Discharge instructions   Complete by: As directed    [place] PCP in one to two weeks.     Allergies as of 05/10/2019      Reactions   Bee Venom Anaphylaxis   Tamsulosin Other (See Comments)   "Makes me feel weird"      Medication List    STOP taking these medications   HYDROcodone-acetaminophen 5-325 MG tablet Commonly known as:  NORCO/VICODIN     TAKE these medications   alprostadil 1000 MCG pellet Commonly known as: MUSE 1,000 mcg by Transurethral route daily as needed for erectile dysfunction. use no more than 3 times per week   atenolol 25 MG tablet Commonly known as: TENORMIN Take 25 mg by mouth daily.   bisacodyl 5 MG EC tablet Commonly known as: DULCOLAX Take 5 mg by mouth at bedtime as needed for moderate constipation.   carvedilol 6.25 MG tablet Commonly known as: COREG Take 1 tablet (6.25 mg total) by mouth 2 (two) times daily with a meal for 30 days.   feeding supplement (ENSURE ENLIVE) Liqd Take 237 mLs by mouth 2 (two) times daily between meals.   finasteride 5 MG tablet Commonly known as: PROSCAR Take 5 mg by mouth daily.   losartan-hydrochlorothiazide 100-12.5 MG tablet Commonly known as: HYZAAR Take 0.5 tablets by mouth daily.   omeprazole 20 MG capsule Commonly known as: PRILOSEC Take 40 mg by mouth 2 (two) times daily before a meal.   polyethylene glycol 17 g packet Commonly known as: MIRALAX / GLYCOLAX Take 17-34 g by mouth daily.   vitamin C 1000 MG tablet Take 1,000 mg by mouth daily.       Allergies  Allergen Reactions  . Bee Venom Anaphylaxis  . Tamsulosin Other (See Comments)    "Makes me feel weird"    Consultations:  General surgery.    Procedures/Studies: DG Abd 1 View  Result Date: 05/09/2019 CLINICAL DATA:  86 year old male with history  of abdominal pain. EXAM: ABDOMEN - 1 VIEW COMPARISON:  05/08/2019. FINDINGS: Gas and stool noted throughout the colon extending to the level of the distal rectum. No pathologic dilatation of small bowel. No gross evidence of pneumoperitoneum on this single supine view. Multiple pelvic phleboliths are incidentally noted. IMPRESSION: 1. Nonobstructive bowel gas pattern. 2. No pneumoperitoneum. Electronically Signed   By: Vinnie Langton M.D.   On: 05/09/2019 10:02   DG Abd 1 View  Result Date: 05/08/2019 CLINICAL  DATA:  NG tube placement EXAM: ABDOMEN - 1 VIEW COMPARISON:  None. FINDINGS: Nasogastric tube with the tip above the esophagogastric junction. Recommend advancing the tube 11 cm. Persistent gaseous distension of the small bowel and colon. There is no evidence of pneumoperitoneum, portal venous gas or pneumatosis. There are no pathologic calcifications along the expected course of the ureters. The osseous structures are unremarkable. IMPRESSION: Nasogastric tube with the tip above the esophagogastric junction. Recommend advancing the tube 11 cm. Electronically Signed   By: Kathreen Devoid   On: 05/08/2019 13:37   DG Abd 1 View  Result Date: 05/07/2019 CLINICAL DATA:  Small bowel obstruction. EXAM: ABDOMEN - 1 VIEW COMPARISON:  Radiograph 05/05/2019, CT 05/04/2019 FINDINGS: Persistent dilated small bowel in the central abdomen. No significant change from most recent exam. Stool within the ascending colon. No visualized contrast in the colon. Multiple surgical clips in the left upper quadrant. No evidence of free air. IMPRESSION: Persistent dilated small bowel in the central abdomen, consistent with small bowel obstruction. Overall findings are not significantly changed from prior exam. Electronically Signed   By: Keith Rake M.D.   On: 05/07/2019 06:21   CT ABDOMEN PELVIS W CONTRAST  Result Date: 05/04/2019 CLINICAL DATA:  86 year old presenting with severe generalized abdominal pain that began approximately 1 week ago. Personal history of small-bowel obstruction due to adhesions. Personal history of a partial gastric resection. Personal history of at least partial esophagectomy due to esophageal cancer. EXAM: CT ABDOMEN AND PELVIS WITH CONTRAST TECHNIQUE: Multidetector CT imaging of the abdomen and pelvis was performed using the standard protocol following bolus administration of intravenous contrast. CONTRAST:  18mL OMNIPAQUE IOHEXOL 300 MG/ML IV. COMPARISON:  10/19/2018 and earlier. FINDINGS: Lower  chest: Scar and bronchiectasis involving the lower lobes, unchanged. Visualized lung bases otherwise clear. Normal appearing intrathoracic stomach filled with fluid. Hepatobiliary: Liver normal in size and appearance. Numerous small gallstones in the gallbladder. No pericholecystic edema or inflammation. No biliary ductal dilation. Pancreas: Mildly atrophic without evidence of mass, ductal dilation or peripancreatic inflammation. Spleen: Normal in size and appearance. Adrenals/Urinary Tract: Normal appearing adrenal glands. Benign cortical cysts involving the LEFT kidney. No solid renal masses. No hydronephrosis. No urinary tract calculi. Diverticulum arising from the RIGHT posterolateral wall of the urinary bladder. Stomach/Bowel: The entire stomach is present within the chest in this patient with a prior esophagectomy. Multiple dilated loops of small bowel throughout the abdomen and pelvis, with an abrupt transition to normal caliber bowel in the mid abdomen near the midline. Moderate stool burden in the cecum and ascending colon. Remainder of the colon decompressed. Descending and sigmoid colon diverticulosis without evidence of acute diverticulitis. Normal appendix in the RIGHT UPPER pelvis. Vascular/Lymphatic: Severe aortoiliofemoral atherosclerosis without evidence of aneurysm. Visceral arteries atherosclerotic though patent. Normal-appearing portal venous and systemic venous systems. No pathologic lymphadenopathy. Reproductive: Moderate to marked prostate gland enlargement. Normal seminal vesicles. Other: No free intraperitoneal air. No ascites. Musculoskeletal: Mild degenerative disc disease at L3-4, L4-5 and L5-S1. Severe facet  degenerative changes at these levels. Broad-based disc extrusion at L4-5 with severe multifactorial spinal stenosis at this level. Osseous demineralization. No acute findings. IMPRESSION: 1. Partial small bowel obstruction with an abrupt transition to normal caliber bowel in the mid  abdomen near the midline. This may be due to an adhesion or a focal stenosis within the small bowel. 2. Cholelithiasis without evidence of acute cholecystitis. 3. Descending and sigmoid colon diverticulosis without evidence of acute diverticulitis. 4. Moderate to marked prostate gland enlargement. 5. Broad-based disc extrusion at L4-5 with severe multifactorial spinal stenosis at this level. Aortic Atherosclerosis (ICD10-I70.0). Electronically Signed   By: Evangeline Dakin M.D.   On: 05/04/2019 18:47   DG Abd Portable 1V  Result Date: 05/08/2019 CLINICAL DATA:  Partial small bowel obstruction EXAM: PORTABLE ABDOMEN - 1 VIEW COMPARISON:  05/07/2019 FINDINGS: Prominent small bowel loops again noted in the abdomen. No real change since prior study. No organomegaly or free air. IMPRESSION: Persistent dilated small bowel loops compatible with partial small bowel obstruction, unchanged. Electronically Signed   By: Rolm Baptise M.D.   On: 05/08/2019 08:41   DG Abd Portable 1V-Small Bowel Obstruction Protocol-initial, 8 hr delay  Result Date: 05/06/2019 CLINICAL DATA:  Small-bowel obstruction 8 hour delay EXAM: PORTABLE ABDOMEN - 1 VIEW COMPARISON:  05/05/2019 at 1:28 p.m. FINDINGS: Small bowel distension is less evident than on the prior study. There is mildly dilated small bowel in the upper abdomen. IMPRESSION: Mildly dilated upper abdominal small bowel, decreased compared to the earlier study. Electronically Signed   By: Ulyses Jarred M.D.   On: 05/06/2019 00:16   DG Abd Portable 1V-Small Bowel Protocol-Position Verification  Result Date: 05/05/2019 CLINICAL DATA:  86 year old male undergoing gastric tube placement EXAM: PORTABLE ABDOMEN - 1 VIEW COMPARISON:  Abdominal radiograph obtained earlier today FINDINGS: A gastric tube is seen coiled in the region of the esophageal hiatus. The tip does not pass inferior to the diaphragm. Multiple loops of distended and gas-filled small bowel present in the mid  abdomen. The largest individual loop measures up to 6.2 cm in diameter. Patient has surgical changes of prior esophagectomy with gastric pull-through procedure. IMPRESSION: The gastric tube is coiled within the patulous stomach in the lower chest (surgical changes of prior esophagectomy with gastric pull-through). Persistent small bowel obstruction. Electronically Signed   By: Jacqulynn Cadet M.D.   On: 05/05/2019 14:23   DG Abd Portable 1V  Result Date: 05/05/2019 CLINICAL DATA:  Small bowel obstruction. EXAM: PORTABLE ABDOMEN - 1 VIEW COMPARISON:  10/24/2018 and CT 05/04/2019 FINDINGS: Exam demonstrates continued evidence of a dilated small bowel loop over the mid abdomen measuring 5.6 cm in diameter. Air and stool present over the right colon. There is no free peritoneal air. Multiple surgical clips over the left upper quadrant. Contrast present over the bladder. Remainder of the exam is unchanged. IMPRESSION: Persistent air-filled dilated small bowel loops as seen on recent CT scan compatible with small bowel obstructive process. Electronically Signed   By: Marin Olp M.D.   On: 05/05/2019 05:23       Subjective: No new complaints.   Discharge Exam: Vitals:   05/10/19 0510 05/10/19 1347  BP: (!) 143/66 (!) 149/75  Pulse: 73 68  Resp: 18 17  Temp: 98.2 F (36.8 C) 97.7 F (36.5 C)  SpO2: 99% 100%   Vitals:   05/09/19 1406 05/09/19 2051 05/10/19 0510 05/10/19 1347  BP: 136/62 (!) 143/68 (!) 143/66 (!) 149/75  Pulse: 64 65 73 68  Resp: 18 16 18 17   Temp: 98.6 F (37 C) 98.2 F (36.8 C) 98.2 F (36.8 C) 97.7 F (36.5 C)  TempSrc: Oral Oral Oral Oral  SpO2: 100% 97% 99% 100%  Weight:      Height:        General: Pt is alert, awake, not in acute distress Cardiovascular: RRR, S1/S2 +, no rubs, no gallops Respiratory: CTA bilaterally, no wheezing, no rhonchi Abdominal: Soft, NT, ND, bowel sounds + Extremities: no edema, no cyanosis    The results of significant  diagnostics from this hospitalization (including imaging, microbiology, ancillary and laboratory) are listed below for reference.     Microbiology: Recent Results (from the past 240 hour(s))  SARS CORONAVIRUS 2 (TAT 6-24 HRS) Nasopharyngeal Nasopharyngeal Swab     Status: None   Collection Time: 05/04/19  8:47 PM   Specimen: Nasopharyngeal Swab  Result Value Ref Range Status   SARS Coronavirus 2 NEGATIVE NEGATIVE Final    Comment: (NOTE) SARS-CoV-2 target nucleic acids are NOT DETECTED. The SARS-CoV-2 RNA is generally detectable in upper and lower respiratory specimens during the acute phase of infection. Negative results do not preclude SARS-CoV-2 infection, do not rule out co-infections with other pathogens, and should not be used as the sole basis for treatment or other patient management decisions. Negative results must be combined with clinical observations, patient history, and epidemiological information. The expected result is Negative. Fact Sheet for Patients: SugarRoll.be Fact Sheet for Healthcare Providers: https://www.woods-mathews.com/ This test is not yet approved or cleared by the [place] FDA and  has been authorized for detection and/or diagnosis of SARS-CoV-2 by FDA under an Emergency Use Authorization (EUA). This EUA will remain  in effect (meaning this test can be used) for the duration of the COVID-19 declaration under Section 56 4(b)(1) of the Act, 21 U.S.C. section 360bbb-3(b)(1), unless the authorization is terminated or revoked sooner. Performed at [facility], [place] [address] [place],  [address]      Labs: BNP (last 3 results) No results for input(s): BNP in the last 8760 hours. Basic Metabolic Panel: Recent Labs  Lab 05/05/19 0541 05/07/19 0442 05/08/19 0510 05/09/19 0458 05/10/19 0418  NA 136 136 134* 135 138  K 3.6 4.1 3.7 3.6 3.9  CL 96* 97* 99 101 104  CO2 30 27 23 25 28    GLUCOSE 106* 137* 147* 107* 82  BUN 16 12 12 13 9   CREATININE 1.17 1.12 1.05 1.05 1.12  CALCIUM 9.3 9.2 9.1 8.4* 8.7*  MG  --  1.8 1.8 1.8 1.7   Liver Function Tests: Recent Labs  Lab 05/04/19 1633 05/05/19 0541 05/09/19 0458  AST 20 19 20   ALT 14 13 18   ALKPHOS 79 64 45  BILITOT 1.1 1.4* 0.9  PROT 8.2* 7.1 5.8*  ALBUMIN 4.7 4.0 3.3*   Recent Labs  Lab 05/04/19 1633  LIPASE 36   No results for input(s): AMMONIA in the last 168 hours. CBC: Recent Labs  Lab 05/04/19 1633 05/05/19 0541 05/07/19 0442 05/08/19 0510 05/09/19 0458 05/10/19 0418  WBC 13.3* 11.4* 10.9* 8.9 6.7 6.4  NEUTROABS 12.0*  --   --   --  3.4  --   HGB 14.4 13.9 13.8 14.2 12.2* 10.8*  HCT 43.0 41.6 41.6 43.3 37.6* 33.3*  MCV 95.8 96.7 97.0 96.4 98.4 97.4  PLT 315 298 298 299 260 241   Cardiac Enzymes: No results for input(s): CKTOTAL, CKMB, CKMBINDEX, TROPONINI in the last 168 hours. BNP:  Invalid input(s): POCBNP CBG: Recent Labs  Lab 05/09/19 2346 05/10/19 0415 05/10/19 0511 05/10/19 0738 05/10/19 1145  GLUCAP 110* 69* 106* 93 108*   D-Dimer No results for input(s): DDIMER in the last 72 hours. Hgb A1c No results for input(s): HGBA1C in the last 72 hours. Lipid Profile No results for input(s): CHOL, HDL, LDLCALC, TRIG, CHOLHDL, LDLDIRECT in the last 72 hours. Thyroid function studies No results for input(s): TSH, T4TOTAL, T3FREE, THYROIDAB in the last 72 hours.  Invalid input(s): FREET3 Anemia work up No results for input(s): VITAMINB12, FOLATE, FERRITIN, TIBC, IRON, RETICCTPCT in the last 72 hours. Urinalysis    Component Value Date/Time   COLORURINE YELLOW 05/04/2019 2047   APPEARANCEUR CLEAR 05/04/2019 2047   LABSPEC 1.045 (H) 05/04/2019 2047   PHURINE 7.0 05/04/2019 2047   GLUCOSEU NEGATIVE 05/04/2019 2047   HGBUR LARGE (A) 05/04/2019 2047   BILIRUBINUR NEGATIVE 05/04/2019 2047   KETONESUR 20 (A) 05/04/2019 2047   PROTEINUR 100 (A) 05/04/2019 2047   UROBILINOGEN 0.2  09/12/2014 1816   NITRITE NEGATIVE 05/04/2019 2047   LEUKOCYTESUR NEGATIVE 05/04/2019 2047   Sepsis Labs Invalid input(s): PROCALCITONIN,  WBC,  LACTICIDVEN Microbiology Recent Results (from the past 240 hour(s))  SARS CORONAVIRUS 2 (TAT 6-24 HRS) Nasopharyngeal Nasopharyngeal Swab     Status: None   Collection Time: 05/04/19  8:47 PM   Specimen: Nasopharyngeal Swab  Result Value Ref Range Status   SARS Coronavirus 2 NEGATIVE NEGATIVE Final    Comment: (NOTE) SARS-CoV-2 target nucleic acids are NOT DETECTED. The SARS-CoV-2 RNA is generally detectable in upper and lower respiratory specimens during the acute phase of infection. Negative results do not preclude SARS-CoV-2 infection, do not rule out co-infections with other pathogens, and should not be used as the sole basis for treatment or other patient management decisions. Negative results must be combined with clinical observations, patient history, and epidemiological information. The expected result is Negative. Fact Sheet for Patients: SugarRoll.be Fact Sheet for Healthcare Providers: https://www.woods-mathews.com/ This test is not yet approved or cleared by the [place] FDA and  has been authorized for detection and/or diagnosis of SARS-CoV-2 by FDA under an Emergency Use Authorization (EUA). This EUA will remain  in effect (meaning this test can be used) for the duration of the COVID-19 declaration under Section 56 4(b)(1) of the Act, 21 U.S.C. section 360bbb-3(b)(1), unless the authorization is terminated or revoked sooner. Performed at [facility], [place] [address] [place], [place] [address]      Time coordinating discharge: 32 minutes  SIGNED:   Hosie Poisson, MD  [facility] 05/10/2019, 6:31 PM

## 2019-05-10 NOTE — Progress Notes (Signed)
   Subjective/Chief Complaint: Having flatus and bms now, feels mostly back to normal   Objective: Vital signs in last 24 hours: Temp:  [98.2 F (36.8 C)-98.6 F (37 C)] 98.2 F (36.8 C) (01/01 0510) Pulse Rate:  [64-73] 73 (01/01 0510) Resp:  [16-18] 18 (01/01 0510) BP: (136-143)/(62-68) 143/66 (01/01 0510) SpO2:  [97 %-100 %] 99 % (01/01 0510) Last BM Date: 05/09/19  Intake/Output from previous day: 12/31 0701 - 01/01 0700 In: [id number] [P.O.:840; I.V.:900; IV Piggyback:50] Out: 501 [Urine:500; Stool:1] Intake/Output this shift: No intake/output data recorded.   Ab soft nontender incision healed has bs   Lab Results:  Recent Labs    05/09/19 0458 05/10/19 0418  WBC 6.7 6.4  HGB 12.2* 10.8*  HCT 37.6* 33.3*  PLT 260 241   BMET Recent Labs    05/09/19 0458 05/10/19 0418  NA 135 138  K 3.6 3.9  CL 101 104  CO2 25 28  GLUCOSE 107* 82  BUN 13 9  CREATININE 1.05 1.12  CALCIUM 8.4* 8.7*   PT/INR No results for input(s): LABPROT, INR in the last 72 hours. ABG No results for input(s): PHART, HCO3 in the last 72 hours.  Invalid input(s): PCO2, PO2  Studies/Results: DG Abd 1 View  Result Date: 05/09/2019 CLINICAL DATA:  86 year old male with history of abdominal pain. EXAM: ABDOMEN - 1 VIEW COMPARISON:  05/08/2019. FINDINGS: Gas and stool noted throughout the colon extending to the level of the distal rectum. No pathologic dilatation of small bowel. No gross evidence of pneumoperitoneum on this single supine view. Multiple pelvic phleboliths are incidentally noted. IMPRESSION: 1. Nonobstructive bowel gas pattern. 2. No pneumoperitoneum. Electronically Signed   By: Vinnie Langton M.D.   On: 05/09/2019 10:02   DG Abd 1 View  Result Date: 05/08/2019 CLINICAL DATA:  NG tube placement EXAM: ABDOMEN - 1 VIEW COMPARISON:  None. FINDINGS: Nasogastric tube with the tip above the esophagogastric junction. Recommend advancing the tube 11 cm. Persistent gaseous distension  of the small bowel and colon. There is no evidence of pneumoperitoneum, portal venous gas or pneumatosis. There are no pathologic calcifications along the expected course of the ureters. The osseous structures are unremarkable. IMPRESSION: Nasogastric tube with the tip above the esophagogastric junction. Recommend advancing the tube 11 cm. Electronically Signed   By: Kathreen Devoid   On: 05/08/2019 13:37    Anti-infectives: Anti-infectives (From admission, onward)   None      Assessment/Plan: SBO - clinically patient is non-distended, non-tender, and has good BS  - mobilize -can dc home today if tolerates breakfast -he is at risk for this again but no role for surgery in this 86 yo at this point FEN: soft diet VTE: SCDs, lovenox ID: no current abx Foley: none Follow up: as needed  Rolm Bookbinder 05/10/2019

## 2019-05-10 NOTE — Plan of Care (Signed)
Pt ready for DC home 

## 2019-05-23 DIAGNOSIS — Z79899 Other long term (current) drug therapy: Secondary | ICD-10-CM | POA: Diagnosis not present

## 2019-05-23 DIAGNOSIS — Z09 Encounter for follow-up examination after completed treatment for conditions other than malignant neoplasm: Secondary | ICD-10-CM | POA: Diagnosis not present

## 2019-05-23 DIAGNOSIS — Z8719 Personal history of other diseases of the digestive system: Secondary | ICD-10-CM | POA: Diagnosis not present

## 2019-05-23 DIAGNOSIS — I10 Essential (primary) hypertension: Secondary | ICD-10-CM | POA: Diagnosis not present

## 2019-06-06 DIAGNOSIS — I10 Essential (primary) hypertension: Secondary | ICD-10-CM | POA: Diagnosis not present

## 2019-06-21 ENCOUNTER — Ambulatory Visit: Payer: Medicare Other | Admitting: Internal Medicine

## 2019-06-24 ENCOUNTER — Ambulatory Visit: Payer: Medicare Other | Attending: Internal Medicine

## 2019-06-24 DIAGNOSIS — Z23 Encounter for immunization: Secondary | ICD-10-CM

## 2019-06-24 NOTE — Progress Notes (Signed)
   Covid-19 Vaccination Clinic  Name:  Oscar [name]    MRN: [id number] DOB: 1932/10/26  06/24/2019  [name] was observed post Covid-19 immunization for 15 minutes without incidence. He was provided with Vaccine Information Sheet and instruction to access the V-Safe system.   [name] was instructed to call 911 with any severe reactions post vaccine: [name] Difficulty breathing  . Swelling of your face and throat  . A fast heartbeat  . A bad rash all over your body  . Dizziness and weakness    Immunizations Administered    Name Date Dose VIS Date Route   Pfizer COVID-19 Vaccine 06/24/2019  3:48 PM 0.3 mL 04/19/2019 Intramuscular   Manufacturer: [place]   Lot: [id number]   [place]: [id number]

## 2019-07-16 ENCOUNTER — Ambulatory Visit: Payer: Medicare Other | Admitting: Internal Medicine

## 2019-07-16 ENCOUNTER — Ambulatory Visit: Payer: Medicare Other | Attending: Internal Medicine

## 2019-07-16 DIAGNOSIS — Z23 Encounter for immunization: Secondary | ICD-10-CM

## 2019-07-16 NOTE — Progress Notes (Signed)
   Covid-19 Vaccination Clinic  Name:  Oscar [name]    MRN: [id number] DOB: July 16, 1932  07/16/2019  [name] was observed post Covid-19 immunization for 15 minutes without incident. He was provided with Vaccine Information Sheet and instruction to access the V-Safe system.   [name] was instructed to call 911 with any severe reactions post vaccine: [name] Difficulty breathing  . Swelling of face and throat  . A fast heartbeat  . A bad rash all over body  . Dizziness and weakness   Immunizations Administered    Name Date Dose VIS Date Route   Pfizer COVID-19 Vaccine 07/16/2019 11:04 AM 0.3 mL 04/19/2019 Intramuscular   Manufacturer: [place]   Lot: [id number]   [place]: [id number]

## 2019-07-17 ENCOUNTER — Ambulatory Visit: Payer: Medicare Other

## 2019-08-05 ENCOUNTER — Other Ambulatory Visit: Payer: Self-pay

## 2019-08-05 ENCOUNTER — Ambulatory Visit: Payer: Self-pay

## 2019-08-05 ENCOUNTER — Ambulatory Visit: Payer: Medicare Other | Admitting: Family Medicine

## 2019-08-05 ENCOUNTER — Encounter: Payer: Self-pay | Admitting: Family Medicine

## 2019-08-05 DIAGNOSIS — M25522 Pain in left elbow: Secondary | ICD-10-CM | POA: Diagnosis not present

## 2019-08-05 NOTE — Progress Notes (Signed)
Office Visit Note   Patient: Oscar [name]           Date of Birth: November 07, 1932           MRN: [id number] Visit Date: 08/05/2019 Requested by: Deland Pretty, MD [address] [place],  [place] [address] PCP: Deland Pretty, MD  Subjective: Chief Complaint  Patient presents with  . Left Elbow - Pain    HPI: He is here with left elbow pain.  Symptoms started a few weeks ago, no injury.  He has a history of elbow problems in the past and has had bone spur removal.  He also has a history of gout.              ROS: No fevers or chills.  All other systems were reviewed and are negative.  Objective: Vital Signs: There were no vitals taken for this visit.  Physical Exam:  General:  Alert and oriented, in no acute distress. Pulm:  Breathing unlabored. Psy:  Normal mood, congruent affect. Skin: No rash. Left elbow: He has warmth around the triceps tendon insertion at the olecranon.  There is no bursitis.  No significant elbow effusion.  He has pain with extension against resistance but intact tendon function.  No tenderness over the epicondyles or in the anterior elbow.  Imaging: Limited diagnostic ultrasound left elbow: He has triceps tendinopathy with hyperemia on power Doppler imaging.  Assessment & Plan: 1.  Left elbow pain, suspect gout attack that triceps tendon -Discussed options with him, he has had very good results with cortisone injections in the past and requests one today.  He will be very cautious with the elbow for about 10 days to avoid tendon rupture.  Follow-up as needed.     Procedures: Left elbow injection: After sterile prep with Betadine, injected 3 cc 1% lidocaine without epinephrine and 20 mg methylprednisolone using ultrasound to guide needle placement anterior to the triceps tendon.  He had good relief during the anesthetic phase.    PMFS History: Patient Active Problem List   Diagnosis Date Noted  . Tobacco abuse 10/19/2018  . BPH  (benign prostatic hyperplasia) 10/19/2018  . AKI (acute kidney injury) ([place])   . Acute on chronic respiratory failure with hypoxia ([place]) 11/15/2017  . Malnutrition of moderate degree 02/15/2017  . Leukocytosis 02/12/2017  . OSA (obstructive sleep apnea) 02/12/2017  . Dehydration 02/12/2017  . Partial small bowel obstruction ([place]) 01/29/2017  . SBO (small bowel obstruction) ([place]) 10/31/2016  . Aortic insufficiency 07/09/2013  . HTN (hypertension) 07/09/2013  . PERSONAL HISTORY MALIGNANT NEOPLASM ESOPHAGUS 07/17/2009  . GERD 07/13/2009  . BARRETT'S ESOPHAGUS 07/13/2009  . DYSPHAGIA 07/13/2009  . FLATULENCE-GAS-BLOATING 07/13/2009   Past Medical History:  Diagnosis Date  . Acid reflux   . Barrett's esophagus   . BPH (benign prostatic hyperplasia)   . Colon polyps   . DDD (degenerative disc disease)   . Diabetes mellitus    controlled with diet and exercise  . ED (erectile dysfunction)   . Esophageal cancer ([place])    tx'd surgery - abdominal & right posterior chest approach (2001) - [place], [place]  . First degree AV block   . Gout   . Hemorrhoids   . History of palpitations   . Hyperlipidemia   . Hypertension   . Lipoma   . Obesity   . Osteoarthritis   . Tinea versicolor   . Tinnitus     Family History  Problem Relation Age of  Onset  . Diabetes Mother   . CAD Mother   . Stroke Sister   . Colon cancer Neg Hx   . Esophageal cancer Neg Hx   . Rectal cancer Neg Hx   . Stomach cancer Neg Hx     Past Surgical History:  Procedure Laterality Date  . bilateral olecranon bursal excisions    . Esophageal Cancer Surgery  2000, 2001  . GASTRIC RESECTION    . HEMORRHOID SURGERY    . NM MYOCAR PERF WALL MOTION  2011   dipyridamole myoview - stress images show medium in size, moderate in intensity perfusion defect in basal inferior & mid inferior walls with mild defect reversibility at rest, EF 64%  . TRANSTHORACIC ECHOCARDIOGRAM  2011   EF=>55%, mild mitral annular calcif, mild  calcif of MV apparatus, mild TR, normal RSVP, mild AV regurg, aortic root sclerosis/calcifiication  . VASECTOMY     Social History   Occupational History  . Occupation: self employed  Tobacco Use  . Smoking status: Current Some Day Smoker    Years: 50.00    Types: Cigars, Pipe  . Smokeless tobacco: Never Used  . Tobacco comment: 2-3x/daily, sometimes not at all , tobacco info given 08/01/13  Substance and Sexual Activity  . Alcohol use: Yes    Alcohol/week: 7.0 - 14.0 standard drinks    Types: 7 - 14 Standard drinks or equivalent per week  . Drug use: No  . Sexual activity: Not on file

## 2019-08-08 DIAGNOSIS — M9904 Segmental and somatic dysfunction of sacral region: Secondary | ICD-10-CM | POA: Diagnosis not present

## 2019-08-08 DIAGNOSIS — M5136 Other intervertebral disc degeneration, lumbar region: Secondary | ICD-10-CM | POA: Diagnosis not present

## 2019-08-08 DIAGNOSIS — M9903 Segmental and somatic dysfunction of lumbar region: Secondary | ICD-10-CM | POA: Diagnosis not present

## 2019-08-08 DIAGNOSIS — M9905 Segmental and somatic dysfunction of pelvic region: Secondary | ICD-10-CM | POA: Diagnosis not present

## 2019-08-21 ENCOUNTER — Ambulatory Visit: Payer: Medicare Other | Admitting: Internal Medicine

## 2019-08-21 ENCOUNTER — Encounter: Payer: Self-pay | Admitting: Internal Medicine

## 2019-08-21 VITALS — BP 148/70 | HR 68 | Temp 98.3°F | Ht 71.25 in | Wt 182.1 lb

## 2019-08-21 DIAGNOSIS — K5901 Slow transit constipation: Secondary | ICD-10-CM | POA: Diagnosis not present

## 2019-08-21 DIAGNOSIS — K089 Disorder of teeth and supporting structures, unspecified: Secondary | ICD-10-CM

## 2019-08-21 DIAGNOSIS — K56609 Unspecified intestinal obstruction, unspecified as to partial versus complete obstruction: Secondary | ICD-10-CM

## 2019-08-21 DIAGNOSIS — Z8501 Personal history of malignant neoplasm of esophagus: Secondary | ICD-10-CM | POA: Diagnosis not present

## 2019-08-21 NOTE — Patient Instructions (Signed)
Please follow up as needed 

## 2019-08-21 NOTE — Progress Notes (Signed)
HISTORY OF PRESENT ILLNESS:  Oscar [name] is a 86 y.o. male with multiple medical problems who is sent to this office for a posthospitalization checkup after being admitted with small bowel obstruction in December.  I have reviewed that hospitalization, x-rays, and laboratories.  Patient tells me that since hospital discharge she has been on MiraLAX.  Does not take every day but typically has a bowel movement every day.  He is eating well without vomiting.  He does have a history of esophageal cancer with prior resection and anastomotic stricturing.  No significant dysphagia at this time as long as he chews his food well (though this is difficult with incomplete dentures).  His last upper endoscopy was performed April 2015.  He was last seen in this office September 2019 by the GI nurse practitioner.  REVIEW OF SYSTEMS:  All non-GI ROS negative unless otherwise stated in the HPI except for shortness of breath, sinus and allergy, arthritis, back pain, visual change, fatigue, hearing problems, muscle cramps, sleeping problems, urinary frequency, excessive urination,  Past Medical History:  Diagnosis Date  . Acid reflux   . Barrett's esophagus   . BPH (benign prostatic hyperplasia)   . Colon polyps   . DDD (degenerative disc disease)   . Diabetes mellitus    controlled with diet and exercise  . ED (erectile dysfunction)   . Esophageal cancer ([place])    tx'd surgery - abdominal & right posterior chest approach (2001) - [place], [place]  . First degree AV block   . Gout   . Hemorrhoids   . History of palpitations   . Hyperlipidemia   . Hypertension   . Lipoma   . Obesity   . Osteoarthritis   . Small bowel obstruction ([place])   . Tinea versicolor   . Tinnitus     Past Surgical History:  Procedure Laterality Date  . bilateral olecranon bursal excisions    . Esophageal Cancer Surgery  2000, 2001  . GASTRIC RESECTION    . HEMORRHOID SURGERY    . NM MYOCAR PERF WALL MOTION  2011   dipyridamole myoview - stress images show medium in size, moderate in intensity perfusion defect in basal inferior & mid inferior walls with mild defect reversibility at rest, EF 64%  . TRANSTHORACIC ECHOCARDIOGRAM  2011   EF=>55%, mild mitral annular calcif, mild calcif of MV apparatus, mild TR, normal RSVP, mild AV regurg, aortic root sclerosis/calcifiication  . VASECTOMY      Social History Oscar [name]  reports that he has been smoking cigars and pipe. He has smoked for the past 50.00 years. He has never used smokeless tobacco. He reports current alcohol use of about 7.0 - 14.0 standard drinks of alcohol per week. He reports that he does not use drugs.  family history includes CAD in his mother; Diabetes in his mother; Stroke in his sister.  Allergies  Allergen Reactions  . Bee Venom Anaphylaxis  . Flomax [Tamsulosin] Other (See Comments)    "Makes me feel weird"       PHYSICAL EXAMINATION: Vital signs: BP (!) 148/70 (BP Location: Left Arm, Patient Position: Sitting, Cuff Size: Normal)   Pulse 68   Temp 98.3 F (36.8 C)   Ht 5' 11.25" (1.81 m) Comment: height measured without shoes  Wt 182 lb 2 oz (82.6 kg)   BMI 25.22 kg/m   Constitutional: generally well-appearing, no acute distress Psychiatric: alert and oriented x3, cooperative Eyes: extraocular movements intact, anicteric, conjunctiva pink Mouth:  oral pharynx moist, no lesions Neck: supple no lymphadenopathy Cardiovascular: heart regular rate and rhythm, no murmur Lungs: clear to auscultation bilaterally Abdomen: soft, nontender, nondistended, no obvious ascites, no peritoneal signs, normal bowel sounds, no organomegaly.  Multiple surgical incisions well-healed Rectal: Omitted Extremities: no clubbing, cyanosis, or lower extremity edema bilaterally Skin: no lesions on visible extremities Neuro: No focal deficits.  Cranial nerves intact  ASSESSMENT:  1.  Mechanical small bowel obstruction secondary to adhesions.   Resolved with supportive care 2.  History of esophageal cancer status post esophagectomy 3.  History of dysphagia due to anastomotic stricturing.  Last upper endoscopy April 2015 4.  Multiple general medical problems 5.  Slow transit constipation   PLAN:  1.  Discussion on mechanical small bowel obstruction 2.  Continue MiraLAX to address constipation 3.  Chew food well 4.  Resume general medical care with PCP 5.  GI follow-up as needed Total time of 30 minutes was spent preparing to see the patient, reviewing extensive outside test x-rays and records, performing medically appropriate physical exam, counseling the patient on his above listed issues, reviewing recommended medical therapies, and documenting information in the clinical health record

## 2019-11-21 DIAGNOSIS — M9903 Segmental and somatic dysfunction of lumbar region: Secondary | ICD-10-CM | POA: Diagnosis not present

## 2019-11-21 DIAGNOSIS — M9905 Segmental and somatic dysfunction of pelvic region: Secondary | ICD-10-CM | POA: Diagnosis not present

## 2019-11-21 DIAGNOSIS — M5136 Other intervertebral disc degeneration, lumbar region: Secondary | ICD-10-CM | POA: Diagnosis not present

## 2019-11-21 DIAGNOSIS — M9904 Segmental and somatic dysfunction of sacral region: Secondary | ICD-10-CM | POA: Diagnosis not present

## 2019-12-02 DIAGNOSIS — M5136 Other intervertebral disc degeneration, lumbar region: Secondary | ICD-10-CM | POA: Diagnosis not present

## 2019-12-02 DIAGNOSIS — M9905 Segmental and somatic dysfunction of pelvic region: Secondary | ICD-10-CM | POA: Diagnosis not present

## 2019-12-02 DIAGNOSIS — M9903 Segmental and somatic dysfunction of lumbar region: Secondary | ICD-10-CM | POA: Diagnosis not present

## 2019-12-02 DIAGNOSIS — M9904 Segmental and somatic dysfunction of sacral region: Secondary | ICD-10-CM | POA: Diagnosis not present

## 2019-12-09 DIAGNOSIS — M9903 Segmental and somatic dysfunction of lumbar region: Secondary | ICD-10-CM | POA: Diagnosis not present

## 2019-12-09 DIAGNOSIS — M9904 Segmental and somatic dysfunction of sacral region: Secondary | ICD-10-CM | POA: Diagnosis not present

## 2019-12-09 DIAGNOSIS — M9905 Segmental and somatic dysfunction of pelvic region: Secondary | ICD-10-CM | POA: Diagnosis not present

## 2019-12-09 DIAGNOSIS — M5136 Other intervertebral disc degeneration, lumbar region: Secondary | ICD-10-CM | POA: Diagnosis not present

## 2019-12-11 DIAGNOSIS — M9904 Segmental and somatic dysfunction of sacral region: Secondary | ICD-10-CM | POA: Diagnosis not present

## 2019-12-11 DIAGNOSIS — M9903 Segmental and somatic dysfunction of lumbar region: Secondary | ICD-10-CM | POA: Diagnosis not present

## 2019-12-11 DIAGNOSIS — M5136 Other intervertebral disc degeneration, lumbar region: Secondary | ICD-10-CM | POA: Diagnosis not present

## 2019-12-11 DIAGNOSIS — M9905 Segmental and somatic dysfunction of pelvic region: Secondary | ICD-10-CM | POA: Diagnosis not present

## 2019-12-17 DIAGNOSIS — M545 Low back pain: Secondary | ICD-10-CM | POA: Diagnosis not present

## 2019-12-25 DIAGNOSIS — M9903 Segmental and somatic dysfunction of lumbar region: Secondary | ICD-10-CM | POA: Diagnosis not present

## 2019-12-25 DIAGNOSIS — M9905 Segmental and somatic dysfunction of pelvic region: Secondary | ICD-10-CM | POA: Diagnosis not present

## 2019-12-25 DIAGNOSIS — M5136 Other intervertebral disc degeneration, lumbar region: Secondary | ICD-10-CM | POA: Diagnosis not present

## 2019-12-25 DIAGNOSIS — M9904 Segmental and somatic dysfunction of sacral region: Secondary | ICD-10-CM | POA: Diagnosis not present

## 2019-12-26 ENCOUNTER — Emergency Department (HOSPITAL_COMMUNITY): Payer: No Typology Code available for payment source

## 2019-12-26 ENCOUNTER — Other Ambulatory Visit: Payer: Self-pay

## 2019-12-26 ENCOUNTER — Inpatient Hospital Stay (HOSPITAL_COMMUNITY): Payer: No Typology Code available for payment source

## 2019-12-26 ENCOUNTER — Inpatient Hospital Stay (HOSPITAL_COMMUNITY)
Admission: EM | Admit: 2019-12-26 | Discharge: 2019-12-30 | DRG: 336 | Disposition: A | Discharge status: Home / Self Care | Payer: No Typology Code available for payment source | Attending: Internal Medicine | Admitting: Internal Medicine

## 2019-12-26 ENCOUNTER — Encounter (HOSPITAL_COMMUNITY): Payer: Self-pay

## 2019-12-26 DIAGNOSIS — K219 Gastro-esophageal reflux disease without esophagitis: Secondary | ICD-10-CM | POA: Diagnosis present

## 2019-12-26 DIAGNOSIS — E875 Hyperkalemia: Secondary | ICD-10-CM | POA: Diagnosis present

## 2019-12-26 DIAGNOSIS — K5651 Intestinal adhesions [bands], with partial obstruction: Secondary | ICD-10-CM | POA: Diagnosis not present

## 2019-12-26 DIAGNOSIS — Z8501 Personal history of malignant neoplasm of esophagus: Secondary | ICD-10-CM | POA: Diagnosis not present

## 2019-12-26 DIAGNOSIS — L918 Other hypertrophic disorders of the skin: Secondary | ICD-10-CM | POA: Diagnosis present

## 2019-12-26 DIAGNOSIS — K660 Peritoneal adhesions (postprocedural) (postinfection): Secondary | ICD-10-CM | POA: Diagnosis present

## 2019-12-26 DIAGNOSIS — N400 Enlarged prostate without lower urinary tract symptoms: Secondary | ICD-10-CM

## 2019-12-26 DIAGNOSIS — E1165 Type 2 diabetes mellitus with hyperglycemia: Secondary | ICD-10-CM | POA: Diagnosis present

## 2019-12-26 DIAGNOSIS — E871 Hypo-osmolality and hyponatremia: Secondary | ICD-10-CM | POA: Diagnosis present

## 2019-12-26 DIAGNOSIS — K8020 Calculus of gallbladder without cholecystitis without obstruction: Secondary | ICD-10-CM | POA: Diagnosis not present

## 2019-12-26 DIAGNOSIS — K5669 Other partial intestinal obstruction: Secondary | ICD-10-CM | POA: Diagnosis not present

## 2019-12-26 DIAGNOSIS — M4184 Other forms of scoliosis, thoracic region: Secondary | ICD-10-CM | POA: Diagnosis not present

## 2019-12-26 DIAGNOSIS — Z903 Acquired absence of stomach [part of]: Secondary | ICD-10-CM | POA: Diagnosis not present

## 2019-12-26 DIAGNOSIS — R52 Pain, unspecified: Secondary | ICD-10-CM | POA: Diagnosis not present

## 2019-12-26 DIAGNOSIS — Z720 Tobacco use: Secondary | ICD-10-CM | POA: Diagnosis not present

## 2019-12-26 DIAGNOSIS — Z833 Family history of diabetes mellitus: Secondary | ICD-10-CM

## 2019-12-26 DIAGNOSIS — K2270 Barrett's esophagus without dysplasia: Secondary | ICD-10-CM | POA: Diagnosis present

## 2019-12-26 DIAGNOSIS — E440 Moderate protein-calorie malnutrition: Secondary | ICD-10-CM | POA: Diagnosis present

## 2019-12-26 DIAGNOSIS — K6389 Other specified diseases of intestine: Secondary | ICD-10-CM | POA: Diagnosis not present

## 2019-12-26 DIAGNOSIS — K56609 Unspecified intestinal obstruction, unspecified as to partial versus complete obstruction: Secondary | ICD-10-CM | POA: Diagnosis not present

## 2019-12-26 DIAGNOSIS — R1010 Upper abdominal pain, unspecified: Secondary | ICD-10-CM | POA: Diagnosis present

## 2019-12-26 DIAGNOSIS — E860 Dehydration: Secondary | ICD-10-CM | POA: Diagnosis present

## 2019-12-26 DIAGNOSIS — Z20822 Contact with and (suspected) exposure to covid-19: Secondary | ICD-10-CM | POA: Diagnosis present

## 2019-12-26 DIAGNOSIS — D72829 Elevated white blood cell count, unspecified: Secondary | ICD-10-CM | POA: Diagnosis present

## 2019-12-26 DIAGNOSIS — N179 Acute kidney failure, unspecified: Secondary | ICD-10-CM | POA: Diagnosis present

## 2019-12-26 DIAGNOSIS — F1290 Cannabis use, unspecified, uncomplicated: Secondary | ICD-10-CM | POA: Diagnosis present

## 2019-12-26 DIAGNOSIS — Z86010 Personal history of colonic polyps: Secondary | ICD-10-CM | POA: Diagnosis not present

## 2019-12-26 DIAGNOSIS — Z9049 Acquired absence of other specified parts of digestive tract: Secondary | ICD-10-CM

## 2019-12-26 DIAGNOSIS — Z79899 Other long term (current) drug therapy: Secondary | ICD-10-CM

## 2019-12-26 DIAGNOSIS — Z8249 Family history of ischemic heart disease and other diseases of the circulatory system: Secondary | ICD-10-CM | POA: Diagnosis not present

## 2019-12-26 DIAGNOSIS — E785 Hyperlipidemia, unspecified: Secondary | ICD-10-CM | POA: Diagnosis present

## 2019-12-26 DIAGNOSIS — R739 Hyperglycemia, unspecified: Secondary | ICD-10-CM | POA: Diagnosis not present

## 2019-12-26 DIAGNOSIS — K56699 Other intestinal obstruction unspecified as to partial versus complete obstruction: Secondary | ICD-10-CM | POA: Diagnosis not present

## 2019-12-26 DIAGNOSIS — M109 Gout, unspecified: Secondary | ICD-10-CM | POA: Diagnosis present

## 2019-12-26 DIAGNOSIS — I10 Essential (primary) hypertension: Secondary | ICD-10-CM | POA: Diagnosis present

## 2019-12-26 DIAGNOSIS — G4733 Obstructive sleep apnea (adult) (pediatric): Secondary | ICD-10-CM

## 2019-12-26 DIAGNOSIS — R531 Weakness: Secondary | ICD-10-CM | POA: Diagnosis not present

## 2019-12-26 DIAGNOSIS — Z743 Need for continuous supervision: Secondary | ICD-10-CM | POA: Diagnosis not present

## 2019-12-26 DIAGNOSIS — Z0189 Encounter for other specified special examinations: Secondary | ICD-10-CM

## 2019-12-26 DIAGNOSIS — Z888 Allergy status to other drugs, medicaments and biological substances status: Secondary | ICD-10-CM

## 2019-12-26 DIAGNOSIS — R109 Unspecified abdominal pain: Secondary | ICD-10-CM | POA: Diagnosis not present

## 2019-12-26 DIAGNOSIS — Z91030 Bee allergy status: Secondary | ICD-10-CM

## 2019-12-26 DIAGNOSIS — K5939 Other megacolon: Secondary | ICD-10-CM | POA: Diagnosis not present

## 2019-12-26 DIAGNOSIS — F17290 Nicotine dependence, other tobacco product, uncomplicated: Secondary | ICD-10-CM | POA: Diagnosis present

## 2019-12-26 DIAGNOSIS — E876 Hypokalemia: Secondary | ICD-10-CM | POA: Diagnosis not present

## 2019-12-26 DIAGNOSIS — Z9889 Other specified postprocedural states: Secondary | ICD-10-CM

## 2019-12-26 DIAGNOSIS — K449 Diaphragmatic hernia without obstruction or gangrene: Secondary | ICD-10-CM | POA: Diagnosis not present

## 2019-12-26 DIAGNOSIS — I700 Atherosclerosis of aorta: Secondary | ICD-10-CM | POA: Diagnosis not present

## 2019-12-26 DIAGNOSIS — R5381 Other malaise: Secondary | ICD-10-CM | POA: Diagnosis not present

## 2019-12-26 DIAGNOSIS — I351 Nonrheumatic aortic (valve) insufficiency: Secondary | ICD-10-CM | POA: Diagnosis present

## 2019-12-26 DIAGNOSIS — Z4682 Encounter for fitting and adjustment of non-vascular catheter: Secondary | ICD-10-CM | POA: Diagnosis not present

## 2019-12-26 LAB — COMPREHENSIVE METABOLIC PANEL
ALT: 10 U/L (ref 0–44)
AST: 38 U/L (ref 15–41)
Albumin: 4.7 g/dL (ref 3.5–5.0)
Alkaline Phosphatase: 70 U/L (ref 38–126)
Anion gap: 13 (ref 5–15)
BUN: 16 mg/dL (ref 8–23)
CO2: 27 mmol/L (ref 22–32)
Calcium: 9.8 mg/dL (ref 8.9–10.3)
Chloride: 93 mmol/L — ABNORMAL LOW (ref 98–111)
Creatinine, Ser: 1.31 mg/dL — ABNORMAL HIGH (ref 0.61–1.24)
GFR calc Af Amer: 58 mL/min — ABNORMAL LOW (ref >60)
GFR calc non Af Amer: 50 mL/min — ABNORMAL LOW (ref >60)
Glucose, Bld: 135 mg/dL — ABNORMAL HIGH (ref 70–99)
Potassium: 6.2 mmol/L — ABNORMAL HIGH (ref 3.5–5.1)
Sodium: 133 mmol/L — ABNORMAL LOW (ref 135–145)
Total Bilirubin: 1.7 mg/dL — ABNORMAL HIGH (ref 0.3–1.2)
Total Protein: 8.1 g/dL (ref 6.5–8.1)

## 2019-12-26 LAB — CBC WITH DIFFERENTIAL/PLATELET
Abs Immature Granulocytes: 0.09 10*3/uL — ABNORMAL HIGH (ref 0.00–0.07)
Basophils Absolute: 0 10*3/uL (ref 0.0–0.1)
Basophils Relative: 0 %
Eosinophils Absolute: 0 10*3/uL (ref 0.0–0.5)
Eosinophils Relative: 0 %
HCT: 43.2 % (ref 39.0–52.0)
Hemoglobin: 14.4 g/dL (ref 13.0–17.0)
Immature Granulocytes: 1 %
Lymphocytes Relative: 7 %
Lymphs Abs: 1.2 10*3/uL (ref 0.7–4.0)
MCH: 31.9 pg (ref 26.0–34.0)
MCHC: 33.3 g/dL (ref 30.0–36.0)
MCV: 95.8 fL (ref 80.0–100.0)
Monocytes Absolute: 0.9 10*3/uL (ref 0.1–1.0)
Monocytes Relative: 6 %
Neutro Abs: 13.7 10*3/uL — ABNORMAL HIGH (ref 1.7–7.7)
Neutrophils Relative %: 86 %
Platelets: 340 10*3/uL (ref 150–400)
RBC: 4.51 MIL/uL (ref 4.22–5.81)
RDW: 11.9 % (ref 11.5–15.5)
WBC: 15.9 10*3/uL — ABNORMAL HIGH (ref 4.0–10.5)
nRBC: 0 % (ref 0.0–0.2)

## 2019-12-26 LAB — BASIC METABOLIC PANEL
Anion gap: 8 (ref 5–15)
BUN: 13 mg/dL (ref 8–23)
CO2: 28 mmol/L (ref 22–32)
Calcium: 9 mg/dL (ref 8.9–10.3)
Chloride: 99 mmol/L (ref 98–111)
Creatinine, Ser: 0.99 mg/dL (ref 0.61–1.24)
GFR calc Af Amer: >60 mL/min (ref >60)
GFR calc non Af Amer: >60 mL/min (ref >60)
Glucose, Bld: 103 mg/dL — ABNORMAL HIGH (ref 70–99)
Potassium: 4 mmol/L (ref 3.5–5.1)
Sodium: 135 mmol/L (ref 135–145)

## 2019-12-26 LAB — LIPASE, BLOOD: Lipase: 23 U/L (ref 11–51)

## 2019-12-26 LAB — PREALBUMIN: Prealbumin: 20.3 mg/dL (ref 18–38)

## 2019-12-26 LAB — SARS CORONAVIRUS 2 BY RT PCR (HOSPITAL ORDER, PERFORMED IN ~~LOC~~ HOSPITAL LAB): SARS Coronavirus 2: NEGATIVE

## 2019-12-26 LAB — PHOSPHORUS: Phosphorus: 3.4 mg/dL (ref 2.5–4.6)

## 2019-12-26 LAB — LACTIC ACID, PLASMA: Lactic Acid, Venous: 1 mmol/L (ref 0.5–1.9)

## 2019-12-26 MED ORDER — MENTHOL 3 MG MT LOZG
1.0000 | LOZENGE | OROMUCOSAL | Status: DC | PRN
  Filled 2019-12-26: qty 9

## 2019-12-26 MED ORDER — METOPROLOL TARTRATE 5 MG/5ML IV SOLN: 5.0000 mg | Freq: Four times a day (QID) | INTRAVENOUS | Status: DC | PRN

## 2019-12-26 MED ORDER — MORPHINE SULFATE (PF) 4 MG/ML IV SOLN
4.0000 mg | Freq: Once | INTRAVENOUS | Status: AC
  Administered 2019-12-26: 4 mg via INTRAVENOUS
  Filled 2019-12-26: qty 1

## 2019-12-26 MED ORDER — SODIUM CHLORIDE 0.9 % IV BOLUS: 1000.0000 mL | Freq: Three times a day (TID) | INTRAVENOUS | Status: AC | PRN

## 2019-12-26 MED ORDER — HEPARIN SODIUM (PORCINE) 5000 UNIT/ML IJ SOLN
5000.0000 [IU] | Freq: Three times a day (TID) | INTRAMUSCULAR | Status: DC
  Administered 2019-12-26 – 2019-12-30 (×12): 5000 [IU] via SUBCUTANEOUS
  Filled 2019-12-26 (×11): qty 1

## 2019-12-26 MED ORDER — SODIUM CHLORIDE 0.9 % IV SOLN
8.0000 mg | Freq: Four times a day (QID) | INTRAVENOUS | Status: DC | PRN
  Filled 2019-12-26: qty 4

## 2019-12-26 MED ORDER — HYDROCORTISONE 1 % EX CREA
1.0000 "application " | TOPICAL_CREAM | Freq: Three times a day (TID) | CUTANEOUS | Status: DC | PRN
  Filled 2019-12-26: qty 28

## 2019-12-26 MED ORDER — HYDROCORTISONE (PERIANAL) 2.5 % EX CREA
1.0000 "application " | TOPICAL_CREAM | Freq: Four times a day (QID) | CUTANEOUS | Status: DC | PRN
  Filled 2019-12-26: qty 28.35

## 2019-12-26 MED ORDER — ONDANSETRON HCL 4 MG/2ML IJ SOLN
4.0000 mg | Freq: Four times a day (QID) | INTRAMUSCULAR | Status: DC | PRN
  Filled 2019-12-26: qty 2

## 2019-12-26 MED ORDER — HYDRALAZINE HCL 20 MG/ML IJ SOLN
10.0000 mg | INTRAMUSCULAR | Status: DC | PRN
  Administered 2019-12-26: 10 mg via INTRAVENOUS
  Filled 2019-12-26: qty 1

## 2019-12-26 MED ORDER — ACETAMINOPHEN 650 MG RE SUPP: 650.0000 mg | Freq: Four times a day (QID) | RECTAL | Status: DC | PRN

## 2019-12-26 MED ORDER — LIP MEDEX EX OINT
1.0000 "application " | TOPICAL_OINTMENT | Freq: Two times a day (BID) | CUTANEOUS | Status: DC
  Administered 2019-12-26 – 2019-12-30 (×7): 1 via TOPICAL
  Filled 2019-12-26 (×3): qty 7

## 2019-12-26 MED ORDER — PHENOL 1.4 % MT LIQD
2.0000 | OROMUCOSAL | Status: DC | PRN
  Filled 2019-12-26 (×2): qty 177

## 2019-12-26 MED ORDER — ONDANSETRON HCL 4 MG/2ML IJ SOLN
4.0000 mg | Freq: Once | INTRAMUSCULAR | Status: AC
  Administered 2019-12-26: 4 mg via INTRAVENOUS
  Filled 2019-12-26: qty 2

## 2019-12-26 MED ORDER — SODIUM CHLORIDE 0.9 % IV SOLN
INTRAVENOUS | Status: DC
  Administered 2019-12-26: 125 mL/h via INTRAVENOUS

## 2019-12-26 MED ORDER — DIATRIZOATE MEGLUMINE & SODIUM 66-10 % PO SOLN
90.0000 mL | Freq: Once | ORAL | Status: AC
  Administered 2019-12-26: 90 mL via NASOGASTRIC
  Filled 2019-12-26: qty 90

## 2019-12-26 MED ORDER — PROCHLORPERAZINE EDISYLATE 10 MG/2ML IJ SOLN: 5.0000 mg | INTRAMUSCULAR | Status: DC | PRN

## 2019-12-26 MED ORDER — FENTANYL CITRATE (PF) 100 MCG/2ML IJ SOLN
25.0000 ug | INTRAMUSCULAR | Status: DC | PRN
  Administered 2019-12-26 (×2): 50 ug via INTRAVENOUS
  Administered 2019-12-26: 25 ug via INTRAVENOUS
  Administered 2019-12-27: 50 ug via INTRAVENOUS
  Administered 2019-12-27: 25 ug via INTRAVENOUS
  Administered 2019-12-27 (×2): 50 ug via INTRAVENOUS
  Administered 2019-12-28: 25 ug via INTRAVENOUS
  Administered 2019-12-28: 50 ug via INTRAVENOUS
  Filled 2019-12-26 (×9): qty 2

## 2019-12-26 MED ORDER — LACTATED RINGERS IV BOLUS: 1000.0000 mL | Freq: Three times a day (TID) | INTRAVENOUS | Status: AC | PRN

## 2019-12-26 MED ORDER — MAGIC MOUTHWASH
15.0000 mL | Freq: Four times a day (QID) | ORAL | Status: DC | PRN
  Filled 2019-12-26: qty 15

## 2019-12-26 MED ORDER — ALBUTEROL SULFATE (2.5 MG/3ML) 0.083% IN NEBU: 2.5000 mg | INHALATION_SOLUTION | RESPIRATORY_TRACT | Status: DC | PRN

## 2019-12-26 MED ORDER — BISACODYL 10 MG RE SUPP
10.0000 mg | Freq: Every day | RECTAL | Status: DC
  Administered 2019-12-28: 10 mg via RECTAL
  Filled 2019-12-26 (×2): qty 1

## 2019-12-26 MED ORDER — SODIUM CHLORIDE 0.9 % IV BOLUS
1000.0000 mL | Freq: Once | INTRAVENOUS | Status: AC
  Administered 2019-12-26: 1000 mL via INTRAVENOUS

## 2019-12-26 MED ORDER — METHOCARBAMOL 1000 MG/10ML IJ SOLN
1000.0000 mg | Freq: Four times a day (QID) | INTRAVENOUS | Status: DC | PRN
  Filled 2019-12-26: qty 10

## 2019-12-26 MED ORDER — GUAIFENESIN-DM 100-10 MG/5ML PO SYRP: 10.0000 mL | ORAL_SOLUTION | ORAL | Status: DC | PRN

## 2019-12-26 MED ORDER — ALUM & MAG HYDROXIDE-SIMETH 200-200-20 MG/5ML PO SUSP: 30.0000 mL | Freq: Four times a day (QID) | ORAL | Status: DC | PRN

## 2019-12-26 MED ORDER — SIMETHICONE 40 MG/0.6ML PO SUSP
40.0000 mg | Freq: Four times a day (QID) | ORAL | Status: DC | PRN
  Filled 2019-12-26: qty 0.6

## 2019-12-26 NOTE — H&P (Addendum)
History and Physical    Oscar [name] [id number] DOB: [birth year] DOA: 12/26/2019  PCP: Oscar Pretty, MD Patient coming from: Home  Chief Complaint: Abdominal pain  HPI: Oscar [name] is a 86 y.o. male with history of prior SBO, esophageal cancer status post surgical resection in 2000 and 2001, DM-2 not on meds, HTN, BPH, DDD and tobacco abuse presenting with abdominal pain for 2 days.   Patient reports abdominal pain for 2 days. Pain is across his upper abdomen. Severity was 10/10 but down to 3/10 now after pain medications. He described the pain as cramping. He also reports nausea and gagging but denies emesis. Had 3 bowel movements yesterday after taking miralax that he usually do to prevent constipation. Reports passing gas earlier this morning. He denies fever, chills, chest pain, dyspnea, cough or UTI symptoms.  He lives with his wife. Reports smoking cigars. Reports drinking liquor occasionally. Last drink was about 4 days ago. Reports occasional marijuana. He wishes to be full code.  In ED, BP 171/83> 152/75.Oscar [name] Afebrile. 99% on RA. Na 133. K6.2. Cr 1.3. WBC 16. Lactic acid and COVID-19 PCR negative. CT abdomen and pelvis revealed recurrent SBO, with moderate to high-grade transition zone in the mid ileum. General surgery consulted. NG tube placed. Hospitalist service called for admission.  ROS All review of system negative except for pertinent positives and negatives as history of present illness above.  PMH Past Medical History:  Diagnosis Date  . Acid reflux   . Barrett's esophagus   . BPH (benign prostatic hyperplasia)   . Colon polyps   . DDD (degenerative disc disease)   . Diabetes mellitus    controlled with diet and exercise  . ED (erectile dysfunction)   . Esophageal cancer ([place])    tx'd surgery - abdominal & right posterior chest approach (2001) - [place], [place]  . First degree AV block   . Gout   . Hemorrhoids   . History of palpitations   . Hyperlipidemia    . Hypertension   . Lipoma   . Obesity   . Osteoarthritis   . Small bowel obstruction ([place])   . Tinea versicolor   . Tinnitus    PSH Past Surgical History:  Procedure Laterality Date  . bilateral olecranon bursal excisions    . Esophageal Cancer Surgery  2000, 2001  . GASTRIC RESECTION    . HEMORRHOID SURGERY    . NM MYOCAR PERF WALL MOTION  2011   dipyridamole myoview - stress images show medium in size, moderate in intensity perfusion defect in basal inferior & mid inferior walls with mild defect reversibility at rest, EF 64%  . TRANSTHORACIC ECHOCARDIOGRAM  2011   EF=>55%, mild mitral annular calcif, mild calcif of MV apparatus, mild TR, normal RSVP, mild AV regurg, aortic root sclerosis/calcifiication  . VASECTOMY     Fam HX Family History  Problem Relation Age of Onset  . Diabetes Mother   . CAD Mother   . Stroke Sister   . Colon cancer Neg Hx   . Esophageal cancer Neg Hx   . Rectal cancer Neg Hx   . Stomach cancer Neg Hx     Social Hx  reports that he has been smoking cigars and pipe. He has smoked for the past 50.00 years. He has never used smokeless tobacco. He reports current alcohol use of about 7.0 - 14.0 standard drinks of alcohol per week. He reports that he does not use drugs.  Allergy Allergies  Allergen Reactions  . Bee Venom Anaphylaxis  . Flomax [Tamsulosin] Other (See Comments)    "Makes me feel weird"   Home Meds Prior to Admission medications   Medication Sig Start Date End Date Taking? Authorizing Provider  Ascorbic Acid (VITAMIN C) 1000 MG tablet Take 1,000 mg by mouth daily.   Yes [provider]  atenolol (TENORMIN) 25 MG tablet Take 25 mg by mouth daily. 03/23/19  Yes [provider]  losartan-hydrochlorothiazide (HYZAAR) 100-12.5 MG tablet Take 0.5 tablets by mouth daily.  02/02/17  Yes [provider]  Misc Natural Products ([place]) CAPS Take 1 capsule by mouth daily.   Yes [provider]   omeprazole (PRILOSEC) 20 MG capsule Take 40 mg by mouth 2 (two) times daily before a meal.    Yes [provider]  polyethylene glycol powder (GLYCOLAX/MIRALAX) 17 GM/SCOOP powder Take 34-68 g by mouth daily.   Yes [provider]    Physical Exam: Vitals:   12/26/19 1130 12/26/19 1200 12/26/19 1300 12/26/19 1400  BP: (!) 151/65 (!) 149/82 (!) 166/77 (!) 152/75  Pulse: (!) 59 68 69 66  Resp: 14 13 18 18   Temp:  98.1 F (36.7 C)    TempSrc:  Temporal    SpO2: 100% 100% 98% 100%  Weight:      Height:        GENERAL: Frail looking male.  Appears well.  HEENT: MMM.  Vision and hearing grossly intact.  NECK: Supple.  No apparent JVD.  RESP:  No IWOB. Good air movement bilaterally. CVS:  RRR. Heart sounds normal.  ABD/GI/GU: Bowel sounds present. Soft. Non tender.  MSK/EXT:  Moves extremities. No apparent deformity or edema.  SKIN: no apparent skin lesion or wound NEURO: Awake, alert and oriented appropriately.  No gross deficit.  PSYCH: Calm. Normal affect.   Personally Reviewed Radiological Exams CT Abdomen Pelvis Wo Contrast  Result Date: 12/26/2019 CLINICAL DATA:  Abdominal pain and abdominal distension. EXAM: CT ABDOMEN AND PELVIS WITHOUT CONTRAST TECHNIQUE: Multidetector CT imaging of the abdomen and pelvis was performed following the standard protocol without IV contrast. COMPARISON:  CT 05/04/2019 FINDINGS: Lower chest: Previous gastric pull-through. Lung bases show mild chronic scarring but are otherwise clear. Hepatobiliary: No liver parenchymal lesion. Multiple gallstones dependent in the gallbladder as seen previously. Pancreas: Normal Spleen: Normal Adrenals/Urinary Tract: Adrenal glands are normal. Left upper pole renal cyst as seen previously. Medial left renal cyst as seen previously. No hydronephrosis. Bladder is normal. Stomach/Bowel: Small-bowel obstruction with dilated fluid and air-filled small intestine. Transition zone probably in the mid ileum.  Appendix is normal. No colon pathology. Vascular/Lymphatic: Aortic atherosclerosis. No aneurysm. IVC is normal. No retroperitoneal adenopathy. Reproductive: Enlarged prostate. Other: No free fluid or air. Musculoskeletal: Ordinary degenerative changes with spinal stenosis at L4-5. IMPRESSION: Recurrent small-bowel obstruction, moderate to high-grade. Transition zone in the mid ileum. Cholelithiasis. Aortic Atherosclerosis (ICD10-I70.0). Enlarged prostate. Electronically Signed   By: Nelson Chimes M.D.   On: 12/26/2019 10:47   DG Abd Portable 1V-Small Bowel Protocol-Position Verification  Result Date: 12/26/2019 CLINICAL DATA:  History of intestinal obstruction. Abdominal pain. History of esophageal cancer. EXAM: PORTABLE ABDOMEN - 1 VIEW COMPARISON:  12/26/2019.  CT 12/26/2019. FINDINGS: Interim readjustment of NG tube. NG tube again noted coiled in hiatal hernia. Previously identified small bowel distention is again noted and is again diminished from prior CT of 12/26/2019. Surgical clips upper abdomen. IMPRESSION: 1. Interim readjustment of NG tube. NG tube remains coiled in the  patient's hiatal hernia. 2. Previously identified small bowel distention is again noted and is again diminished from prior CT of 12/26/2019. Electronically Signed   By: Marcello Moores  Register   On: 12/26/2019 13:51   DG Abd Portable 1V-Small Bowel Protocol-Position Verification  Result Date: 12/26/2019 CLINICAL DATA:  NG tube placement. EXAM: PORTABLE ABDOMEN - 1 VIEW COMPARISON:  CT 12/26/2019. FINDINGS: NG tube noted with tip coiled in a hiatal hernia. Decreased small-bowel distention from prior study. No free air. Degenerative changes scoliosis thoracic spine. Surgical clips left upper quadrant. IMPRESSION: NG tube noted with tip coiled in a hiatal hernia. Decreased small-bowel distention from prior study. Electronically Signed   By: Marcello Moores  Register   On: 12/26/2019 13:02     Personally Reviewed Labs: CBC: Recent Labs  Lab  12/26/19 1005  WBC 15.9*  NEUTROABS 13.7*  HGB 14.4  HCT 43.2  MCV 95.8  PLT 458   Basic Metabolic Panel: Recent Labs  Lab 12/26/19 1005  NA 133*  K 6.2*  CL 93*  CO2 27  GLUCOSE 135*  BUN 16  CREATININE 1.31*  CALCIUM 9.8   GFR: Estimated Creatinine Clearance: 44.7 mL/min (A) (by C-G formula based on SCr of 1.31 mg/dL (H)). Liver Function Tests: Recent Labs  Lab 12/26/19 1005  AST 38  ALT 10  ALKPHOS 70  BILITOT 1.7*  PROT 8.1  ALBUMIN 4.7   Recent Labs  Lab 12/26/19 1005  LIPASE 23   No results for input(s): AMMONIA in the last 168 hours. Coagulation Profile: No results for input(s): INR, PROTIME in the last 168 hours. Cardiac Enzymes: No results for input(s): CKTOTAL, CKMB, CKMBINDEX, TROPONINI in the last 168 hours. BNP (last 3 results) No results for input(s): PROBNP in the last 8760 hours. HbA1C: No results for input(s): HGBA1C in the last 72 hours. CBG: No results for input(s): GLUCAP in the last 168 hours. Lipid Profile: No results for input(s): CHOL, HDL, LDLCALC, TRIG, CHOLHDL, LDLDIRECT in the last 72 hours. Thyroid Function Tests: No results for input(s): TSH, T4TOTAL, FREET4, T3FREE, THYROIDAB in the last 72 hours. Anemia Panel: No results for input(s): VITAMINB12, FOLATE, FERRITIN, TIBC, IRON, RETICCTPCT in the last 72 hours. Urine analysis:    Component Value Date/Time   COLORURINE YELLOW 05/04/2019 2047   APPEARANCEUR CLEAR 05/04/2019 2047   LABSPEC 1.045 (H) 05/04/2019 2047   PHURINE 7.0 05/04/2019 2047   GLUCOSEU NEGATIVE 05/04/2019 2047   HGBUR LARGE (A) 05/04/2019 2047   BILIRUBINUR NEGATIVE 05/04/2019 2047   KETONESUR 20 (A) 05/04/2019 2047   PROTEINUR 100 (A) 05/04/2019 2047   UROBILINOGEN 0.2 09/12/2014 1816   NITRITE NEGATIVE 05/04/2019 2047   LEUKOCYTESUR NEGATIVE 05/04/2019 2047    Sepsis Labs:  Lactic acid negative.  Personally Reviewed EKG:  No EKG this admission.  Assessment/Plan Small bowel  obstruction-confirmed on CT abdomen and pelvis. Presents with abdominal pain and nausea. Reported LBM 8/18. Reportedly passed gas earlier this morning. -General surgery managing -NG tube, bowel rest, IVF, analgesics and antiemetics  Surgical clearance-patient has no significant cardiac history.  No history of CHF, MI or arrhythmia.  Diabetes well controlled.  No cardiopulmonary symptoms.  Echo in 2019 with normal EF and G1-DD but no other significant finding.  I believe patient is low risk from cardiac standpoint if surgery is indicated for his SBO.  Hyperkalemia-K6.2. Unclear etiology. -IV normal saline -Recheck BMP  Mild hyponatremia-likely from dehydration. -IV normal saline as above  Mild elevated creatinine: Baseline 1.1-1.2> 1.31 (admit). Likely from dehydration. -Continue  IV fluid  Hyperglycemia-history of DM-2 but no longer medication. -Check hemoglobin A1c  Leukocytosis: Suspect demargination versus infectious process. -Recheck in the morning  OSA -No CPAP in the setting of SBO  Essential hypertension: BP elevated. Seems he is on losartan/HCTZ and atenolol at home. -IV metoprolol 12.5 mg every 6 hours  History of esophageal cancer: Status post resection in 2000 and 2001. Stable  BPH without LUTS  DVT prophylaxis: Subcu heparin  Code Status: Full code Family Communication: attempted to call patient for update but no answer.  Disposition Plan: Admit to MedSurg Consults called: General surgery Admission status: Inpatient   Mercy Riding MD [facility]  If 7PM-7AM, please contact night-coverage [email or web address]  12/26/2019, 2:39 PM

## 2019-12-26 NOTE — ED Notes (Signed)
Delay in pt movement to the floor, pt removed his NG tube. RN Thedore Mins attempting to place at this time.

## 2019-12-26 NOTE — ED Triage Notes (Signed)
Pt BIBA from home. Pt has hx of intestinal blockages, happens about 6-8 months. Pt also has hx of esophageal cancer. Pt has been having some left lower abdominal pain that radiates. Pt has been taking omeprazole as prescribed. Pt states yesterday he had a soft, dark BM, took his omeprazole, and has not had a BM since. Pt A&Ox4

## 2019-12-26 NOTE — ED Provider Notes (Signed)
[facility] DEPT Provider Note   CSN: [id number] Arrival date & time: 12/26/19  0932     History Chief Complaint  Patient presents with  . Abdominal Pain    Oscar [name] is a 86 y.o. male.  86 year old male presents with 2 days of diffuse abdominal discomfort with associated nonbilious emesis. No fever or chills. Does have a history of intestinal blockage and this feels similar. Denies any diarrhea. Pain is persistent and unresponsive to hydrocodone which he took this morning. Denies any urinary symptoms. Called EMS and was transported here        Past Medical History:  Diagnosis Date  . Acid reflux   . Barrett's esophagus   . BPH (benign prostatic hyperplasia)   . Colon polyps   . DDD (degenerative disc disease)   . Diabetes mellitus    controlled with diet and exercise  . ED (erectile dysfunction)   . Esophageal cancer ([place])    tx'd surgery - abdominal & right posterior chest approach (2001) - [place], [place]  . First degree AV block   . Gout   . Hemorrhoids   . History of palpitations   . Hyperlipidemia   . Hypertension   . Lipoma   . Obesity   . Osteoarthritis   . Small bowel obstruction ([place])   . Tinea versicolor   . Tinnitus     Patient Active Problem List   Diagnosis Date Noted  . Tobacco abuse 10/19/2018  . BPH (benign prostatic hyperplasia) 10/19/2018  . AKI (acute kidney injury) ([place])   . Acute on chronic respiratory failure with hypoxia ([place]) 11/15/2017  . Malnutrition of moderate degree 02/15/2017  . Leukocytosis 02/12/2017  . OSA (obstructive sleep apnea) 02/12/2017  . Dehydration 02/12/2017  . Partial small bowel obstruction ([place]) 01/29/2017  . SBO (small bowel obstruction) ([place]) 10/31/2016  . Aortic insufficiency 07/09/2013  . HTN (hypertension) 07/09/2013  . PERSONAL HISTORY MALIGNANT NEOPLASM ESOPHAGUS 07/17/2009  . GERD 07/13/2009  . BARRETT'S ESOPHAGUS 07/13/2009  . DYSPHAGIA 07/13/2009  .  FLATULENCE-GAS-BLOATING 07/13/2009    Past Surgical History:  Procedure Laterality Date  . bilateral olecranon bursal excisions    . Esophageal Cancer Surgery  2000, 2001  . GASTRIC RESECTION    . HEMORRHOID SURGERY    . NM MYOCAR PERF WALL MOTION  2011   dipyridamole myoview - stress images show medium in size, moderate in intensity perfusion defect in basal inferior & mid inferior walls with mild defect reversibility at rest, EF 64%  . TRANSTHORACIC ECHOCARDIOGRAM  2011   EF=>55%, mild mitral annular calcif, mild calcif of MV apparatus, mild TR, normal RSVP, mild AV regurg, aortic root sclerosis/calcifiication  . VASECTOMY         Family History  Problem Relation Age of Onset  . Diabetes Mother   . CAD Mother   . Stroke Sister   . Colon cancer Neg Hx   . Esophageal cancer Neg Hx   . Rectal cancer Neg Hx   . Stomach cancer Neg Hx     Social History   Tobacco Use  . Smoking status: Current Some Day Smoker    Years: 50.00    Types: Cigars, Pipe  . Smokeless tobacco: Never Used  . Tobacco comment: 2-3x/daily, sometimes not at all , tobacco info given 08/01/13  Vaping Use  . Vaping Use: Never used  Substance Use Topics  . Alcohol use: Yes    Alcohol/week: 7.0 - 14.0 standard drinks  Types: 7 - 14 Standard drinks or equivalent per week  . Drug use: No    Home Medications Prior to Admission medications   Medication Sig Start Date End Date Taking? Authorizing Provider  Ascorbic Acid (VITAMIN C) 1000 MG tablet Take 1,000 mg by mouth daily.    [provider]  atenolol (TENORMIN) 25 MG tablet Take 25 mg by mouth daily. 03/23/19   [provider]  losartan-hydrochlorothiazide (HYZAAR) 100-12.5 MG tablet Take 0.5 tablets by mouth daily.  02/02/17   [provider]  omeprazole (PRILOSEC) 20 MG capsule Take 40 mg by mouth 2 (two) times daily before a meal.     [provider]  polyethylene glycol powder (GLYCOLAX/MIRALAX) 17 GM/SCOOP powder  Take 34-68 g by mouth daily.    [provider]    Allergies    Bee venom and Flomax [tamsulosin]  Review of Systems   Review of Systems  All other systems reviewed and are negative.   Physical Exam Updated Vital Signs BP (!) 171/83   Pulse 63   Temp (!) 97.5 F (36.4 C)   Resp 17   Ht 1.803 m (5\' 11" )   Wt 81.2 kg   SpO2 99%   BMI 24.97 kg/m   Physical Exam Vitals and nursing note reviewed.  Constitutional:      General: He is not in acute distress.    Appearance: Normal appearance. He is well-developed. He is not toxic-appearing.  HENT:     Head: Normocephalic and atraumatic.  Eyes:     General: Lids are normal.     Conjunctiva/sclera: Conjunctivae normal.     Pupils: Pupils are equal, round, and reactive to light.  Neck:     Thyroid: No thyroid mass.     Trachea: No tracheal deviation.  Cardiovascular:     Rate and Rhythm: Normal rate and regular rhythm.     Heart sounds: Normal heart sounds. No murmur heard.  No gallop.   Pulmonary:     Effort: Pulmonary effort is normal. No respiratory distress.     Breath sounds: Normal breath sounds. No stridor. No decreased breath sounds, wheezing, rhonchi or rales.  Abdominal:     General: Abdomen is flat. There is no distension.     Palpations: Abdomen is soft. There is no fluid wave.     Tenderness: There is generalized abdominal tenderness. There is guarding. There is no rebound.  Musculoskeletal:        General: No tenderness. Normal range of motion.     Cervical back: Normal range of motion and neck supple.  Skin:    General: Skin is warm and dry.     Findings: No abrasion or rash.  Neurological:     Mental Status: He is alert and oriented to person, place, and time.     GCS: GCS eye subscore is 4. GCS verbal subscore is 5. GCS motor subscore is 6.     Cranial Nerves: No cranial nerve deficit.     Sensory: No sensory deficit.  Psychiatric:        Attention and Perception: Attention normal.         Speech: Speech normal.        Behavior: Behavior normal.     ED Results / Procedures / Treatments   Labs (all labs ordered are listed, but only abnormal results are displayed) Labs Reviewed  SARS CORONAVIRUS 2 BY RT PCR (HOSPITAL ORDER, [facility])  CBC WITH DIFFERENTIAL/PLATELET  COMPREHENSIVE METABOLIC PANEL  LIPASE, BLOOD  LACTIC ACID, PLASMA    EKG EKG Interpretation  Date/Time:  Thursday December 26 2019 10:10:01 EDT Ventricular Rate:  59 PR Interval:    QRS Duration: 97 QT Interval:  417 QTC Calculation: 414 R Axis:   -6 Text Interpretation: Sinus rhythm Borderline prolonged PR interval Probable left atrial enlargement Anterior infarct, old Confirmed by Lacretia Leigh ([id number]) on 12/26/2019 10:48:57 AM   Radiology No results found.  Procedures Procedures (including critical care time)  Medications Ordered in ED Medications  0.9 %  sodium chloride infusion (has no administration in time range)  sodium chloride 0.9 % bolus 1,000 mL (has no administration in time range)  morphine 4 MG/ML injection 4 mg (has no administration in time range)  ondansetron (ZOFRAN) injection 4 mg (has no administration in time range)    ED Course  I have reviewed the triage vital signs and the nursing notes.  Pertinent labs & imaging results that were available during my care of the patient were reviewed by me and considered in my medical decision making (see chart for details).    MDM Rules/Calculators/A&P                          Patient given IV fluids as well as morphine antiemetics.  Abdominal CT consistent with high-grade small bowel obstruction.  Will consult general surgery with possible general medicine admission Final Clinical Impression(s) / ED Diagnoses Final diagnoses:  None    Rx / DC Orders ED Discharge Orders    None       Lacretia Leigh, MD 12/26/19 1052

## 2019-12-26 NOTE — Anesthesia Preprocedure Evaluation (Addendum)
Anesthesia Evaluation  Patient identified by MRN, date of birth, ID band  Reviewed: Allergy & Precautions, NPO status , Patient's Chart, lab work & pertinent test results  Airway Mallampati: II  TM Distance: <3 FB Neck ROM: Full    Dental  (+) Missing   Pulmonary sleep apnea , Current Smoker and Patient abstained from smoking.,    Pulmonary exam normal breath sounds clear to auscultation       Cardiovascular Exercise Tolerance: Good hypertension, Normal cardiovascular exam Rhythm:Regular Rate:Normal  Sinus rhythm Borderline prolonged PR interval Probable left atrial enlargement Anterior infarct, old Confirmed by Lacretia Leigh ([id number]) on 12/26/2019 10:48:57 AM   Neuro/Psych    GI/Hepatic GERD  ,  Endo/Other  diabetes, Type 2  Renal/GU      Musculoskeletal  (+) Arthritis , Osteoarthritis,    Abdominal   Peds  Hematology   Anesthesia Other Findings H/o esophageal cancer s/p surgery 2001  Reproductive/Obstetrics                           Anesthesia Physical Anesthesia Plan  ASA: III  Anesthesia Plan: General   Post-op Pain Management:    Induction: Intravenous, Cricoid pressure planned and Rapid sequence  PONV Risk Score and Plan: 1 and Ondansetron, Dexamethasone and Midazolam  Airway Management Planned: Oral ETT and Video Laryngoscope Planned  Additional Equipment:   Intra-op Plan:   Post-operative Plan: Extubation in OR  Informed Consent: I have reviewed the patients History and Physical, chart, labs and discussed the procedure including the risks, benefits and alternatives for the proposed anesthesia with the patient or authorized representative who has indicated his/her understanding and acceptance.     Dental advisory given  Plan Discussed with: CRNA  Anesthesia Plan Comments: (Glidescope as backup given h/o esophageal cancer. NGT in place will suction prior to induction.  RSI. GETA. Existing PIV's. )       Anesthesia Quick Evaluation

## 2019-12-26 NOTE — ED Notes (Signed)
Patient refusing NG

## 2019-12-26 NOTE — Consult Note (Addendum)
Oscar [name] 01-05-1933  [id number].    Requesting MD: Dr. Zenia Resides Chief Complaint/Reason for Consult: SBO  HPI: Oscar [name] is a 86 y.o. male with a history of HTN, HLD, DM2 tobacco abuse, and prior exploratory laparotomy with esophagectomy and partial gastrectomy for esophageal cancer in 2000 and 2001 who presented with abdominal pain and nausea.   Patient reports that yesterday afternoon he began having lower abdominal cramping.  He notes around 10:30 PM his pain became more constant and severe with associated nausea.  He had 3-4 loose BM's before onset of his symptoms. He presented to the ED for evaluation. Since presentation, his pain, distension, and nausea has resolved. He is still passing flatus and had a very small bm around 2am. He is currently asymptomatic.  He reports this feels like when he has had SBO's in the past.  It appears that patient has had at least 6 prior admissions in our system for SBO's.  All were treated nonoperatively.   CT w/ recurrent small-bowel obstruction, moderate to high-grade w/ transition zone in the mid ileum. We were asked to see.   ROS: Review of Systems  Constitutional: Negative for chills and fever.  Respiratory: Negative for cough and shortness of breath.   Cardiovascular: Negative for chest pain and leg swelling.  Gastrointestinal: Positive for abdominal pain, diarrhea and nausea. Negative for blood in stool, constipation (normally has daily BM's that are soft (takes miralax daily)), heartburn and vomiting.  Genitourinary: Negative for dysuria.  Musculoskeletal: Negative for back pain.  Psychiatric/Behavioral: Negative for suicidal ideas.  All other systems reviewed and are negative.   Family History  Problem Relation Age of Onset  . Diabetes Mother   . CAD Mother   . Stroke Sister   . Colon cancer Neg Hx   . Esophageal cancer Neg Hx   . Rectal cancer Neg Hx   . Stomach cancer Neg Hx     Past Medical History:  Diagnosis Date   . Acid reflux   . Barrett's esophagus   . BPH (benign prostatic hyperplasia)   . Colon polyps   . DDD (degenerative disc disease)   . Diabetes mellitus    controlled with diet and exercise  . ED (erectile dysfunction)   . Esophageal cancer ([place])    tx'd surgery - abdominal & right posterior chest approach (2001) - [place], [place]  . First degree AV block   . Gout   . Hemorrhoids   . History of palpitations   . Hyperlipidemia   . Hypertension   . Lipoma   . Obesity   . Osteoarthritis   . Small bowel obstruction ([place])   . Tinea versicolor   . Tinnitus     Past Surgical History:  Procedure Laterality Date  . bilateral olecranon bursal excisions    . Esophageal Cancer Surgery  2000, 2001  . GASTRIC RESECTION    . HEMORRHOID SURGERY    . NM MYOCAR PERF WALL MOTION  2011   dipyridamole myoview - stress images show medium in size, moderate in intensity perfusion defect in basal inferior & mid inferior walls with mild defect reversibility at rest, EF 64%  . TRANSTHORACIC ECHOCARDIOGRAM  2011   EF=>55%, mild mitral annular calcif, mild calcif of MV apparatus, mild TR, normal RSVP, mild AV regurg, aortic root sclerosis/calcifiication  . VASECTOMY      Social History:  reports that he has been smoking cigars and pipe. He has smoked for the past  50.00 years. He has never used smokeless tobacco. He reports current alcohol use of about 7.0 - 14.0 standard drinks of alcohol per week. He reports that he does not use drugs.  Allergies:  Allergies  Allergen Reactions  . Bee Venom Anaphylaxis  . Flomax [Tamsulosin] Other (See Comments)    "Makes me feel weird"    (Not in a hospital admission)    Physical Exam: Blood pressure (!) 156/77, pulse 64, temperature (!) 97.5 F (36.4 C), resp. rate 14, height 5\' 11"  (1.803 m), weight 81.2 kg, SpO2 99 %. General: pleasant, WD/WN AAmale who is laying in bed in NAD HEENT: head is normocephalic, atraumatic.  Sclera are noninjected.  PERRL.   Ears and nose without any masses or lesions.  Mouth is pink and moist. Dentition fair Heart: regular, rate, and rhythm.  Normal s1,s2. No obvious murmurs, gallops, or rubs noted.  Palpable pedal pulses bilaterally  Lungs: CTAB, no wheezes, rhonchi, or rales noted.  Respiratory effort nonlabored Abd: Soft, NT/ND, +BS, no masses, hernias, or organomegaly. Prior laparotomy scar well healed.  MS: no BUE/BLE edema, calves soft and nontender Skin: warm and dry with no masses, lesions, or rashes Psych: A&Ox4 with an appropriate affect Neuro: cranial nerves grossly intact, equal strength in BUE/BLE bilaterally, normal speech, though process intact   Results for orders placed or performed during the hospital encounter of 12/26/19 (from the past 48 hour(s))  CBC with Differential/Platelet     Status: Abnormal   Collection Time: 12/26/19 10:05 AM  Result Value Ref Range   WBC 15.9 (H) 4.0 - 10.5 K/uL   RBC 4.51 4.22 - 5.81 MIL/uL   Hemoglobin 14.4 13.0 - 17.0 g/dL   HCT 43.2 39 - 52 %   MCV 95.8 80.0 - 100.0 fL   MCH 31.9 26.0 - 34.0 pg   MCHC 33.3 30.0 - 36.0 g/dL   RDW 11.9 11.5 - 15.5 %   Platelets 340 150 - 400 K/uL   nRBC 0.0 0.0 - 0.2 %   Neutrophils Relative % 86 %   Neutro Abs 13.7 (H) 1.7 - 7.7 K/uL   Lymphocytes Relative 7 %   Lymphs Abs 1.2 0.7 - 4.0 K/uL   Monocytes Relative 6 %   Monocytes Absolute 0.9 0 - 1 K/uL   Eosinophils Relative 0 %   Eosinophils Absolute 0.0 0 - 0 K/uL   Basophils Relative 0 %   Basophils Absolute 0.0 0 - 0 K/uL   Immature Granulocytes 1 %   Abs Immature Granulocytes 0.09 (H) 0.00 - 0.07 K/uL    Comment: Performed at [facility], [place] [address], [place], [place] [address]  Comprehensive metabolic panel     Status: Abnormal   Collection Time: 12/26/19 10:05 AM  Result Value Ref Range   Sodium 133 (L) 135 - 145 mmol/L   Potassium 6.2 (H) 3.5 - 5.1 mmol/L   Chloride 93 (L) 98 - 111 mmol/L   CO2 27 22 - 32 mmol/L   Glucose, Bld  135 (H) 70 - 99 mg/dL    Comment: Glucose reference range applies only to samples taken after fasting for at least 8 hours.   BUN 16 8 - 23 mg/dL   Creatinine, Ser 1.31 (H) 0.61 - 1.24 mg/dL   Calcium 9.8 8.9 - 10.3 mg/dL   Total Protein 8.1 6.5 - 8.1 g/dL   Albumin 4.7 3.5 - 5.0 g/dL   AST 38 15 - 41 U/L   ALT 10 0 -  44 U/L   Alkaline Phosphatase 70 38 - 126 U/L   Total Bilirubin 1.7 (H) 0.3 - 1.2 mg/dL   GFR calc non Af Amer 50 (L) >60 mL/min   GFR calc Af Amer 58 (L) >60 mL/min   Anion gap 13 5 - 15    Comment: Performed at [facility], [place] [address], [place], [place] [address]  Lipase, blood     Status: None   Collection Time: 12/26/19 10:05 AM  Result Value Ref Range   Lipase 23 11 - 51 U/L    Comment: Performed at [facility], [place] [address] [place], [place] [address]   CT Abdomen Pelvis Wo Contrast  Result Date: 12/26/2019 CLINICAL DATA:  Abdominal pain and abdominal distension. EXAM: CT ABDOMEN AND PELVIS WITHOUT CONTRAST TECHNIQUE: Multidetector CT imaging of the abdomen and pelvis was performed following the standard protocol without IV contrast. COMPARISON:  CT 05/04/2019 FINDINGS: Lower chest: Previous gastric pull-through. Lung bases show mild chronic scarring but are otherwise clear. Hepatobiliary: No liver parenchymal lesion. Multiple gallstones dependent in the gallbladder as seen previously. Pancreas: Normal Spleen: Normal Adrenals/Urinary Tract: Adrenal glands are normal. Left upper pole renal cyst as seen previously. Medial left renal cyst as seen previously. No hydronephrosis. Bladder is normal. Stomach/Bowel: Small-bowel obstruction with dilated fluid and air-filled small intestine. Transition zone probably in the mid ileum. Appendix is normal. No colon pathology. Vascular/Lymphatic: Aortic atherosclerosis. No aneurysm. IVC is normal. No retroperitoneal adenopathy. Reproductive: Enlarged prostate. Other: No free fluid or air.  Musculoskeletal: Ordinary degenerative changes with spinal stenosis at L4-5. IMPRESSION: Recurrent small-bowel obstruction, moderate to high-grade. Transition zone in the mid ileum. Cholelithiasis. Aortic Atherosclerosis (ICD10-I70.0). Enlarged prostate. Electronically Signed   By: Nelson Chimes M.D.   On: 12/26/2019 10:47      Assessment/Plan HTN HLD GERD T2DM Hx of esophageal cancer s/p esophagectomy Hx of gout Hyperkalemia - K 6.2 Hyponatremia  AKI - Cr 1.31 - Per TRH -    SBO - CT w/ SBO w/ transition zone in the mid ileum - No indication for emergency surgery.  - Admit to medicine - Patient refusing NGT. Given asymptomatic, okay to hold off. Will plan for SBOP orally. If he develops recurrent abdominal pain, distension, nausea or has an episode of emesis, recommend NGT - Keep K> 4 and Mg > 2 for bowel function - Mobilize for bowel function - Given this is the patients 7th admission in our system for SBO, could consider Diagnostic Laparoscopy to help prevent recurrence in the future. Patient is unsure if this is something that he would like to do. Recommend Cardiology consultation to determine patients risk of surgery.  - We will follow along with you  FEN - NPO, IVF VTE - SCDs, okay for chemical prophylaxis from a general surgery stanpdoint ID - None   Oscar [name], [facility] Surgery 12/26/2019, 11:42 AM Please see Amion for pager number during day hours 7:00am-4:30pm

## 2019-12-26 NOTE — ED Notes (Signed)
Walked into room and pt had accidentally removed NG tube. Another tube was placed, gastric sounds heard when asculated and stomach contents could be suctioned from tube. Repeat chest xray placed.

## 2019-12-26 NOTE — ED Notes (Signed)
Pt to CT

## 2019-12-27 ENCOUNTER — Encounter (HOSPITAL_COMMUNITY)
Admission: EM | Disposition: A | Discharge status: Home / Self Care | Payer: Self-pay | Source: Home / Self Care | Attending: Internal Medicine

## 2019-12-27 ENCOUNTER — Inpatient Hospital Stay (HOSPITAL_COMMUNITY): Payer: No Typology Code available for payment source

## 2019-12-27 ENCOUNTER — Inpatient Hospital Stay (HOSPITAL_COMMUNITY): Payer: No Typology Code available for payment source | Admitting: Anesthesiology

## 2019-12-27 DIAGNOSIS — E871 Hypo-osmolality and hyponatremia: Secondary | ICD-10-CM

## 2019-12-27 DIAGNOSIS — N179 Acute kidney failure, unspecified: Secondary | ICD-10-CM

## 2019-12-27 HISTORY — PX: LAPAROSCOPY: SHX197

## 2019-12-27 LAB — CBC
HCT: 39.1 % (ref 39.0–52.0)
Hemoglobin: 12.6 g/dL — ABNORMAL LOW (ref 13.0–17.0)
MCH: 32.1 pg (ref 26.0–34.0)
MCHC: 32.2 g/dL (ref 30.0–36.0)
MCV: 99.5 fL (ref 80.0–100.0)
Platelets: 274 10*3/uL (ref 150–400)
RBC: 3.93 MIL/uL — ABNORMAL LOW (ref 4.22–5.81)
RDW: 12 % (ref 11.5–15.5)
WBC: 8.9 10*3/uL (ref 4.0–10.5)
nRBC: 0 % (ref 0.0–0.2)

## 2019-12-27 LAB — BASIC METABOLIC PANEL
Anion gap: 11 (ref 5–15)
BUN: 12 mg/dL (ref 8–23)
CO2: 23 mmol/L (ref 22–32)
Calcium: 8.8 mg/dL — ABNORMAL LOW (ref 8.9–10.3)
Chloride: 102 mmol/L (ref 98–111)
Creatinine, Ser: 0.94 mg/dL (ref 0.61–1.24)
GFR calc Af Amer: >60 mL/min (ref >60)
GFR calc non Af Amer: >60 mL/min (ref >60)
Glucose, Bld: 79 mg/dL (ref 70–99)
Potassium: 4 mmol/L (ref 3.5–5.1)
Sodium: 136 mmol/L (ref 135–145)

## 2019-12-27 LAB — HEMOGLOBIN A1C
Hgb A1c MFr Bld: 5.1 % (ref 4.8–5.6)
Mean Plasma Glucose: 99.67 mg/dL

## 2019-12-27 SURGERY — LAPAROSCOPY, DIAGNOSTIC
Anesthesia: General

## 2019-12-27 MED ORDER — ALBUMIN HUMAN 5 % IV SOLN: INTRAVENOUS | Status: DC | PRN

## 2019-12-27 MED ORDER — ONDANSETRON HCL 4 MG/2ML IJ SOLN
INTRAMUSCULAR | Status: DC | PRN
  Administered 2019-12-27: 4 mg via INTRAVENOUS

## 2019-12-27 MED ORDER — SUCCINYLCHOLINE CHLORIDE 200 MG/10ML IV SOSY
PREFILLED_SYRINGE | INTRAVENOUS | Status: DC | PRN
  Administered 2019-12-27: 120 mg via INTRAVENOUS

## 2019-12-27 MED ORDER — LIDOCAINE 2% (20 MG/ML) 5 ML SYRINGE
INTRAMUSCULAR | Status: DC | PRN
  Administered 2019-12-27: 50 mg via INTRAVENOUS

## 2019-12-27 MED ORDER — HYDRALAZINE HCL 20 MG/ML IJ SOLN
10.0000 mg | INTRAMUSCULAR | Status: DC | PRN
  Administered 2019-12-29 – 2019-12-30 (×2): 10 mg via INTRAVENOUS
  Filled 2019-12-27 (×2): qty 1

## 2019-12-27 MED ORDER — EPHEDRINE SULFATE-NACL 50-0.9 MG/10ML-% IV SOSY
PREFILLED_SYRINGE | INTRAVENOUS | Status: DC | PRN
  Administered 2019-12-27: 5 mg via INTRAVENOUS
  Administered 2019-12-27: 10 mg via INTRAVENOUS

## 2019-12-27 MED ORDER — STERILE WATER FOR IRRIGATION IR SOLN
Status: DC | PRN
  Administered 2019-12-27: 1000 mL

## 2019-12-27 MED ORDER — BUPIVACAINE LIPOSOME 1.3 % IJ SUSP
20.0000 mL | Freq: Once | INTRAMUSCULAR | Status: DC
  Filled 2019-12-27: qty 20

## 2019-12-27 MED ORDER — FENTANYL CITRATE (PF) 100 MCG/2ML IJ SOLN
INTRAMUSCULAR | Status: AC
  Filled 2019-12-27: qty 2

## 2019-12-27 MED ORDER — MIDAZOLAM HCL 2 MG/2ML IJ SOLN: INTRAMUSCULAR | Status: DC | PRN

## 2019-12-27 MED ORDER — ONDANSETRON HCL 4 MG/2ML IJ SOLN
INTRAMUSCULAR | Status: AC
  Filled 2019-12-27: qty 2

## 2019-12-27 MED ORDER — FENTANYL CITRATE (PF) 250 MCG/5ML IJ SOLN
INTRAMUSCULAR | Status: DC | PRN
Start: 2019-12-27 — End: 2019-12-27
  Administered 2019-12-27 (×4): 50 ug via INTRAVENOUS

## 2019-12-27 MED ORDER — PROMETHAZINE HCL 25 MG/ML IJ SOLN: 6.2500 mg | INTRAMUSCULAR | Status: DC | PRN

## 2019-12-27 MED ORDER — BUPIVACAINE HCL (PF) 0.25 % IJ SOLN
INTRAMUSCULAR | Status: DC | PRN
  Administered 2019-12-27: 50 mL

## 2019-12-27 MED ORDER — PROPOFOL 10 MG/ML IV BOLUS
INTRAVENOUS | Status: AC
  Filled 2019-12-27: qty 20

## 2019-12-27 MED ORDER — 0.9 % SODIUM CHLORIDE (POUR BTL) OPTIME
TOPICAL | Status: DC | PRN
  Administered 2019-12-27: 1000 mL

## 2019-12-27 MED ORDER — LACTATED RINGERS IV SOLN: INTRAVENOUS | Status: DC | PRN

## 2019-12-27 MED ORDER — HYDROMORPHONE HCL 1 MG/ML IJ SOLN
0.2500 mg | INTRAMUSCULAR | Status: DC | PRN
  Administered 2019-12-27 (×4): 0.5 mg via INTRAVENOUS

## 2019-12-27 MED ORDER — SODIUM CHLORIDE 0.9 % IV SOLN
2.0000 g | INTRAVENOUS | Status: AC
  Administered 2019-12-27: 2 g via INTRAVENOUS
  Filled 2019-12-27 (×2): qty 2

## 2019-12-27 MED ORDER — ROCURONIUM BROMIDE 10 MG/ML (PF) SYRINGE
PREFILLED_SYRINGE | INTRAVENOUS | Status: AC
  Filled 2019-12-27: qty 10

## 2019-12-27 MED ORDER — BUPIVACAINE HCL 0.25 % IJ SOLN
INTRAMUSCULAR | Status: AC
  Filled 2019-12-27: qty 1

## 2019-12-27 MED ORDER — CHLORHEXIDINE GLUCONATE CLOTH 2 % EX PADS
6.0000 | MEDICATED_PAD | Freq: Once | CUTANEOUS | Status: AC
  Administered 2019-12-27: 6 via TOPICAL

## 2019-12-27 MED ORDER — DEXAMETHASONE SODIUM PHOSPHATE 10 MG/ML IJ SOLN
INTRAMUSCULAR | Status: DC | PRN
  Administered 2019-12-27: 4 mg via INTRAVENOUS

## 2019-12-27 MED ORDER — HYDROMORPHONE HCL 1 MG/ML IJ SOLN
INTRAMUSCULAR | Status: AC
  Filled 2019-12-27: qty 1

## 2019-12-27 MED ORDER — HYDROMORPHONE HCL 1 MG/ML IJ SOLN
INTRAMUSCULAR | Status: AC
Start: 2019-12-27 — End: 2019-12-28
  Filled 2019-12-27: qty 1

## 2019-12-27 MED ORDER — LABETALOL HCL 5 MG/ML IV SOLN
10.0000 mg | INTRAVENOUS | Status: DC | PRN
  Administered 2019-12-30: 10 mg via INTRAVENOUS
  Filled 2019-12-27 (×2): qty 4

## 2019-12-27 MED ORDER — ROCURONIUM BROMIDE 10 MG/ML (PF) SYRINGE
PREFILLED_SYRINGE | INTRAVENOUS | Status: DC | PRN
  Administered 2019-12-27 (×2): 10 mg via INTRAVENOUS
  Administered 2019-12-27: 40 mg via INTRAVENOUS
  Administered 2019-12-27: 20 mg via INTRAVENOUS

## 2019-12-27 MED ORDER — PROPOFOL 10 MG/ML IV BOLUS
INTRAVENOUS | Status: DC | PRN
  Administered 2019-12-27: 100 mg via INTRAVENOUS

## 2019-12-27 MED ORDER — SUGAMMADEX SODIUM 200 MG/2ML IV SOLN
INTRAVENOUS | Status: DC | PRN
  Administered 2019-12-27: 200 mg via INTRAVENOUS

## 2019-12-27 MED ORDER — DEXAMETHASONE SODIUM PHOSPHATE 10 MG/ML IJ SOLN
INTRAMUSCULAR | Status: AC
  Filled 2019-12-27: qty 1

## 2019-12-27 MED ORDER — LIDOCAINE 2% (20 MG/ML) 5 ML SYRINGE
INTRAMUSCULAR | Status: AC
  Filled 2019-12-27: qty 5

## 2019-12-27 SURGICAL SUPPLY — 38 items
CABLE HIGH FREQUENCY MONO STRZ (ELECTRODE) ×3 IMPLANT
COVER SURGICAL LIGHT HANDLE (MISCELLANEOUS) ×3 IMPLANT
COVER WAND RF STERILE (DRAPES) IMPLANT
DECANTER SPIKE VIAL GLASS SM (MISCELLANEOUS) ×3 IMPLANT
DRAPE UTILITY XL STRL (DRAPES) ×3 IMPLANT
DRAPE WARM FLUID 44X44 (DRAPES) ×3 IMPLANT
DRSG TEGADERM 2-3/8X2-3/4 SM (GAUZE/BANDAGES/DRESSINGS) IMPLANT
DRSG TEGADERM 4X4.75 (GAUZE/BANDAGES/DRESSINGS) IMPLANT
DRSG TEGADERM 6X8 (GAUZE/BANDAGES/DRESSINGS) ×2 IMPLANT
ELECT REM PT RETURN 15FT ADLT (MISCELLANEOUS) ×3 IMPLANT
GAUZE SPONGE 2X2 8PLY STRL LF (GAUZE/BANDAGES/DRESSINGS) ×1 IMPLANT
GLOVE ECLIPSE 8.0 STRL XLNG CF (GLOVE) ×3 IMPLANT
GLOVE INDICATOR 8.0 STRL GRN (GLOVE) ×3 IMPLANT
GOWN STRL REUS W/TWL XL LVL3 (GOWN DISPOSABLE) ×9 IMPLANT
IRRIG SUCT STRYKERFLOW 2 WTIP (MISCELLANEOUS) ×3
IRRIGATION SUCT STRKRFLW 2 WTP (MISCELLANEOUS) ×1 IMPLANT
KIT BASIN OR (CUSTOM PROCEDURE TRAY) ×3 IMPLANT
KIT TURNOVER KIT A (KITS) IMPLANT
PAD POSITIONING PINK XL (MISCELLANEOUS) ×3 IMPLANT
PROTECTOR NERVE ULNAR (MISCELLANEOUS) IMPLANT
SCISSORS LAP 5X35 DISP (ENDOMECHANICALS) ×3 IMPLANT
SEALER TISSUE G2 STRG ARTC 35C (ENDOMECHANICALS) IMPLANT
SET TUBE SMOKE EVAC HIGH FLOW (TUBING) ×3 IMPLANT
SLEEVE XCEL OPT CAN 5 100 (ENDOMECHANICALS) ×6 IMPLANT
SPONGE GAUZE 2X2 STER 10/PKG (GAUZE/BANDAGES/DRESSINGS) ×2
SUT MNCRL AB 4-0 PS2 18 (SUTURE) ×3 IMPLANT
SUT SILK 2 0 (SUTURE) ×3
SUT SILK 2 0 SH CR/8 (SUTURE) ×3 IMPLANT
SUT SILK 2-0 18XBRD TIE 12 (SUTURE) ×1 IMPLANT
SUT SILK 3 0 (SUTURE) ×3
SUT SILK 3 0 SH CR/8 (SUTURE) ×3 IMPLANT
SUT SILK 3-0 18XBRD TIE 12 (SUTURE) ×1 IMPLANT
TOWEL OR 17X26 10 PK STRL BLUE (TOWEL DISPOSABLE) ×3 IMPLANT
TOWEL OR NON WOVEN STRL DISP B (DISPOSABLE) ×3 IMPLANT
TRAY FOLEY MTR SLVR 16FR STAT (SET/KITS/TRAYS/PACK) IMPLANT
TRAY LAPAROSCOPIC (CUSTOM PROCEDURE TRAY) ×3 IMPLANT
TROCAR BLADELESS OPT 5 100 (ENDOMECHANICALS) ×3 IMPLANT
TROCAR XCEL NON-BLD 11X100MML (ENDOMECHANICALS) IMPLANT

## 2019-12-27 NOTE — Assessment & Plan Note (Signed)
-   continue IVF - transient HyperK has resolved

## 2019-12-27 NOTE — Interval H&P Note (Signed)
History and Physical Interval Note:  12/27/2019 9:42 AM  Oscar [name]  has presented today for surgery, with the diagnosis of RECURRENT SMALL BOWEL OBSTRUCTION.  The various methods of treatment have been discussed with the patient and family. After consideration of risks, benefits and other options for treatment, the patient has consented to  Procedure(s): LAPAROSCOPY DIAGNOSTIC; LYSIS OF ADHESION (N/A) as a surgical intervention.  The patient's history has been reviewed, patient examined, no change in status, stable for surgery.  I have reviewed the patient's chart and labs.  Questions were answered to the patient's satisfaction.    I have re-reviewed the the patient's records, history, medications, and allergies.  I have re-examined the patient.  I again discussed intraoperative plans and goals of post-operative recovery.  The patient agrees to proceed.  [facility]  May 26, 1932 [id number]  Patient Care Team: Deland Pretty, MD as PCP - General (Internal Medicine) Pixie Casino, MD as Consulting Physician (Cardiology) Irene Shipper, MD as Consulting Physician (Gastroenterology) Eunice Blase, MD (Family Medicine)  Patient Active Problem List   Diagnosis Date Noted   History of esophagectomy 12/26/2019    Priority: High   Hyperkalemia 12/26/2019   ARF (acute renal failure) ([place]) 12/26/2019   Tobacco abuse 10/19/2018   BPH (benign prostatic hyperplasia) 10/19/2018   AKI (acute kidney injury) ([place])    Acute on chronic respiratory failure with hypoxia ([place]) 11/15/2017   Malnutrition of moderate degree 02/15/2017   Leukocytosis 02/12/2017   OSA (obstructive sleep apnea) 02/12/2017   Dehydration 02/12/2017   SBO (small bowel obstruction) - recurrent 10/31/2016   Aortic insufficiency 07/09/2013   HTN (hypertension) 07/09/2013   GERD 07/13/2009   DYSPHAGIA 07/13/2009   FLATULENCE-GAS-BLOATING 07/13/2009   History of esophageal cancer 2001 07/18/1999    Past Medical  History:  Diagnosis Date   Acid reflux    Barrett's esophagus    BPH (benign prostatic hyperplasia)    Colon polyps    DDD (degenerative disc disease)    Diabetes mellitus    controlled with diet and exercise   ED (erectile dysfunction)    Esophageal cancer ([place]) 2001   tx'd surgery - abdominal & right posterior chest approach (2001) - [place], [place]   First degree AV block    Gout    Hemorrhoids    History of palpitations    Hyperlipidemia    Hypertension    Lipoma    Obesity    Osteoarthritis    Small bowel obstruction (HCC)    Tinea versicolor    Tinnitus     Past Surgical History:  Procedure Laterality Date   bilateral olecranon bursal excisions     ESOPHAGECTOMY  2001   [facility] Thoraicic/Abd approach.  High Grade Dysplasia w esophageal CA   HEMORRHOID SURGERY     NM MYOCAR PERF WALL MOTION  2011   dipyridamole myoview - stress images show medium in size, moderate in intensity perfusion defect in basal inferior & mid inferior walls with mild defect reversibility at rest, EF 64%   TRANSTHORACIC ECHOCARDIOGRAM  2011   EF=>55%, mild mitral annular calcif, mild calcif of MV apparatus, mild TR, normal RSVP, mild AV regurg, aortic root sclerosis/calcifiication   VASECTOMY      Social History   Socioeconomic History   Marital status: Married    Spouse name: Not on file   Number of children: 4   Years of education: Not on file   Highest education level: Not on  file  Occupational History   Occupation: self employed  Tobacco Use   Smoking status: Current Some Day Smoker    Years: 50.00    Types: Cigars, Pipe   Smokeless tobacco: Never Used   Tobacco comment: 2-3x/daily, sometimes not at all , tobacco info given 08/01/13  Vaping Use   Vaping Use: Never used  Substance and Sexual Activity   Alcohol use: Yes    Alcohol/week: 7.0 - 14.0 standard drinks    Types: 7 - 14 Standard drinks or equivalent per week   Drug use: No   Sexual activity: Not on file   Other Topics Concern   Not on file  Social History Narrative   Not on file   Social Determinants of Health   Financial Resource Strain:    Difficulty of Paying Living Expenses: Not on file  Food Insecurity:    Worried About [place] in the Last Year: Not on file   Ran Out of Food in the Last Year: Not on file  Transportation Needs:    Lack of Transportation (Medical): Not on file   Lack of Transportation (Non-Medical): Not on file  Physical Activity:    Days of Exercise per Week: Not on file   Minutes of Exercise per Session: Not on file  Stress:    Feeling of Stress : Not on file  Social Connections:    Frequency of Communication with Friends and Family: Not on file   Frequency of Social Gatherings with Friends and Family: Not on file   Attends Religious Services: Not on file   Active Member of Clubs or Organizations: Not on file   Attends Archivist Meetings: Not on file   Marital Status: Not on file  Intimate Partner Violence:    Fear of Current or Ex-Partner: Not on file   Emotionally Abused: Not on file   Physically Abused: Not on file   Sexually Abused: Not on file    Family History  Problem Relation Age of Onset   Diabetes Mother    CAD Mother    Stroke Sister    Colon cancer Neg Hx    Esophageal cancer Neg Hx    Rectal cancer Neg Hx    Stomach cancer Neg Hx     Medications Prior to Admission  Medication Sig Dispense Refill Last Dose   Ascorbic Acid (VITAMIN C) 1000 MG tablet Take 1,000 mg by mouth daily.   Past Week at Unknown time   atenolol (TENORMIN) 25 MG tablet Take 25 mg by mouth daily.   12/25/2019 at 0700   losartan-hydrochlorothiazide (HYZAAR) 100-12.5 MG tablet Take 0.5 tablets by mouth daily.   4 12/26/2019 at Unknown time   Misc Natural Products (PROSTATE HEALTH) CAPS Take 1 capsule by mouth daily.   12/25/2019 at Unknown time   omeprazole (PRILOSEC) 20 MG capsule Take 40 mg by mouth 2 (two) times daily before a meal.     12/26/2019 at Unknown time   polyethylene glycol powder (GLYCOLAX/MIRALAX) 17 GM/SCOOP powder Take 34-68 g by mouth daily.   12/25/2019 at Unknown time    Current Facility-Administered Medications  Medication Dose Route Frequency Provider Last Rate Last Admin   0.9 %  sodium chloride infusion   Intravenous Continuous Mercy Riding, MD 125 mL/hr at 12/27/19 0053 New Bag at 12/27/19 0053   [MAR Hold] acetaminophen (TYLENOL) suppository 650 mg  650 mg Rectal Q6H PRN Mercy Riding, MD       [facility]  Hold] albuterol (PROVENTIL) (2.5 MG/3ML) 0.083% nebulizer solution 2.5 mg  2.5 mg Nebulization Q2H PRN Gonfa, Charlesetta Ivory, MD       [MAR Hold] alum & mag hydroxide-simeth (MAALOX/MYLANTA) 200-200-20 MG/5ML suspension 30 mL  30 mL Oral Q6H PRN Cyndia Skeeters, Charlesetta Ivory, MD       [MAR Hold] bisacodyl (DULCOLAX) suppository 10 mg  10 mg Rectal Daily Gonfa, Taye T, MD       cefoTEtan (CEFOTAN) 2 g in sodium chloride 0.9 % 100 mL IVPB  2 g Intravenous On Call to OR Michael Boston, MD       [facility] Hold] fentaNYL (SUBLIMAZE) injection 25-50 mcg  25-50 mcg Intravenous Q1H PRN Mercy Riding, MD   50 mcg at 12/27/19 0635   [MAR Hold] guaiFENesin-dextromethorphan (ROBITUSSIN DM) 100-10 MG/5ML syrup 10 mL  10 mL Oral Q4H PRN Mercy Riding, MD       [MAR Hold] heparin injection 5,000 Units  5,000 Units Subcutaneous Q8H Wendee Beavers T, MD   5,000 Units at 12/27/19 0635   [facility] Hold] hydrALAZINE (APRESOLINE) injection 10 mg  10 mg Intravenous Q4H PRN Wendee Beavers T, MD   10 mg at 12/26/19 1540   [MAR Hold] hydrocortisone (ANUSOL-HC) 2.5 % rectal cream 1 application  1 application Topical QID PRN Mercy Riding, MD       [MAR Hold] hydrocortisone cream 1 % 1 application  1 application Topical TID PRN Cyndia Skeeters, Charlesetta Ivory, MD       [MAR Hold] lactated ringers bolus 1,000 mL  1,000 mL Intravenous Q8H PRN Gonfa, Charlesetta Ivory, MD       [MAR Hold] lip balm (CARMEX) ointment 1 application  1 application Topical BID Mercy Riding, MD   1 application at 09/32/67 0900    [MAR Hold] magic mouthwash  15 mL Oral QID PRN Mercy Riding, MD       [MAR Hold] menthol-cetylpyridinium (CEPACOL) lozenge 3 mg  1 lozenge Oral PRN Mercy Riding, MD       [MAR Hold] methocarbamol (ROBAXIN) 1,000 mg in dextrose 5 % 100 mL IVPB  1,000 mg Intravenous Q6H PRN Mercy Riding, MD       [MAR Hold] metoprolol tartrate (LOPRESSOR) injection 5 mg  5 mg Intravenous Q6H PRN Gonfa, Charlesetta Ivory, MD       [MAR Hold] ondansetron (ZOFRAN) injection 4 mg  4 mg Intravenous Q6H PRN Gonfa, Charlesetta Ivory, MD       Or   [MAR Hold] ondansetron (ZOFRAN) 8 mg in sodium chloride 0.9 % 50 mL IVPB  8 mg Intravenous Q6H PRN Gonfa, Charlesetta Ivory, MD       [MAR Hold] phenol (CHLORASEPTIC) mouth spray 2 spray  2 spray Mouth/Throat PRN Gonfa, Charlesetta Ivory, MD       [MAR Hold] prochlorperazine (COMPAZINE) injection 5-10 mg  5-10 mg Intravenous Q4H PRN Gonfa, Charlesetta Ivory, MD       [MAR Hold] simethicone (MYLICON) 40 TI/4.5YK suspension 40 mg  40 mg Oral QID PRN Mercy Riding, MD       [MAR Hold] sodium chloride 0.9 % bolus 1,000 mL  1,000 mL Intravenous Q8H PRN Cyndia Skeeters, Taye T, MD         Allergies  Allergen Reactions   Bee Venom Anaphylaxis   Flomax [Tamsulosin] Other (See Comments)    "Makes me feel weird"    BP (!) 161/76   Pulse 71   Temp 98.2 F (36.8 C) (Oral)   Resp  18   Ht 5\' 11"  (1.803 m)   Wt 81.2 kg   SpO2 98%   BMI 24.97 kg/m   Labs: Results for orders placed or performed during the hospital encounter of 12/26/19 (from the past 48 hour(s))  Lactic acid, plasma     Status: None   Collection Time: 12/26/19  9:56 AM  Result Value Ref Range   Lactic Acid, Venous 1.0 0.5 - 1.9 mmol/L    Comment: Performed at [facility], [place] [address] [place], [place] [address]  SARS Coronavirus 2 by RT PCR (hospital order, performed in [facility] hospital lab) Nasopharyngeal Nasopharyngeal Swab     Status: None   Collection Time: 12/26/19  9:56 AM   Specimen: Nasopharyngeal Swab  Result Value Ref Range   SARS  Coronavirus 2 NEGATIVE NEGATIVE    Comment: (NOTE) SARS-CoV-2 target nucleic acids are NOT DETECTED.  The SARS-CoV-2 RNA is generally detectable in upper and lower respiratory specimens during the acute phase of infection. The lowest concentration of SARS-CoV-2 viral copies this assay can detect is 250 copies / mL. A negative result does not preclude SARS-CoV-2 infection and should not be used as the sole basis for treatment or other patient management decisions.  A negative result may occur with improper specimen collection / handling, submission of specimen other than nasopharyngeal swab, presence of viral mutation(s) within the areas targeted by this assay, and inadequate number of viral copies (<250 copies / mL). A negative result must be combined with clinical observations, patient history, and epidemiological information.  Fact Sheet for Patients:   StrictlyIdeas.no  Fact Sheet for Healthcare Providers: BankingDealers.co.za  This test is not yet approved or  cleared by the [place] FDA and has been authorized for detection and/or diagnosis of SARS-CoV-2 by FDA under an Emergency Use Authorization (EUA).  This EUA will remain in effect (meaning this test can be used) for the duration of the COVID-19 declaration under Section 564(b)(1) of the Act, 21 U.S.C. section 360bbb-3(b)(1), unless the authorization is terminated or revoked sooner.  Performed at [facility], [place] [address], [place], [place] [address]   CBC with Differential/Platelet     Status: Abnormal   Collection Time: 12/26/19 10:05 AM  Result Value Ref Range   WBC 15.9 (H) 4.0 - 10.5 K/uL   RBC 4.51 4.22 - 5.81 MIL/uL   Hemoglobin 14.4 13.0 - 17.0 g/dL   HCT 43.2 39 - 52 %   MCV 95.8 80.0 - 100.0 fL   MCH 31.9 26.0 - 34.0 pg   MCHC 33.3 30.0 - 36.0 g/dL   RDW 11.9 11.5 - 15.5 %   Platelets 340 150 - 400 K/uL   nRBC 0.0 0.0 - 0.2 %    Neutrophils Relative % 86 %   Neutro Abs 13.7 (H) 1.7 - 7.7 K/uL   Lymphocytes Relative 7 %   Lymphs Abs 1.2 0.7 - 4.0 K/uL   Monocytes Relative 6 %   Monocytes Absolute 0.9 0 - 1 K/uL   Eosinophils Relative 0 %   Eosinophils Absolute 0.0 0 - 0 K/uL   Basophils Relative 0 %   Basophils Absolute 0.0 0 - 0 K/uL   Immature Granulocytes 1 %   Abs Immature Granulocytes 0.09 (H) 0.00 - 0.07 K/uL    Comment: Performed at [facility], [place] [address] [place], [place] [address]  Comprehensive metabolic panel     Status: Abnormal   Collection Time: 12/26/19 10:05 AM  Result Value Ref Range   Sodium 133 (L) 135 - 145 mmol/L   Potassium 6.2 (H) 3.5 - 5.1 mmol/L   Chloride 93 (L) 98 - 111 mmol/L   CO2 27 22 - 32 mmol/L   Glucose, Bld 135 (H) 70 - 99 mg/dL    Comment: Glucose reference range applies only to samples taken after fasting for at least 8 hours.   BUN 16 8 - 23 mg/dL   Creatinine, Ser 1.31 (H) 0.61 - 1.24 mg/dL   Calcium 9.8 8.9 - 10.3 mg/dL   Total Protein 8.1 6.5 - 8.1 g/dL   Albumin 4.7 3.5 - 5.0 g/dL   AST 38 15 - 41 U/L   ALT 10 0 - 44 U/L   Alkaline Phosphatase 70 38 - 126 U/L   Total Bilirubin 1.7 (H) 0.3 - 1.2 mg/dL   GFR calc non Af Amer 50 (L) >60 mL/min   GFR calc Af Amer 58 (L) >60 mL/min   Anion gap 13 5 - 15    Comment: Performed at [facility], [place] [address], [place], [place] [address]  Lipase, blood     Status: None   Collection Time: 12/26/19 10:05 AM  Result Value Ref Range   Lipase 23 11 - 51 U/L    Comment: Performed at [facility], [place] [address] [place], [place] [address]  Prealbumin     Status: None   Collection Time: 12/26/19 10:05 AM  Result Value Ref Range   Prealbumin 20.3 18 - 38 mg/dL    Comment: Performed at [facility], [place] [address] [place], [place] [address]  Basic metabolic panel     Status: Abnormal   Collection Time: 12/26/19  5:25 PM  Result Value Ref  Range   Sodium 135 135 - 145 mmol/L   Potassium 4.0 3.5 - 5.1 mmol/L    Comment: DELTA CHECK NOTED   Chloride 99 98 - 111 mmol/L   CO2 28 22 - 32 mmol/L   Glucose, Bld 103 (H) 70 - 99 mg/dL    Comment: Glucose reference range applies only to samples taken after fasting for at least 8 hours.   BUN 13 8 - 23 mg/dL   Creatinine, Ser 0.99 0.61 - 1.24 mg/dL   Calcium 9.0 8.9 - 10.3 mg/dL   GFR calc non Af Amer >60 >60 mL/min   GFR calc Af Amer >60 >60 mL/min   Anion gap 8 5 - 15    Comment: Performed at [facility], [place] [address], [place], [place] [address]  Phosphorus     Status: None   Collection Time: 12/26/19  5:25 PM  Result Value Ref Range   Phosphorus 3.4 2.5 - 4.6 mg/dL    Comment: Performed at [facility], [place] [address] [place],  [address]  Basic metabolic panel     Status: Abnormal   Collection Time: 12/27/19  5:30 AM  Result Value Ref Range   Sodium 136 135 - 145 mmol/L   Potassium 4.0 3.5 - 5.1 mmol/L   Chloride 102 98 - 111 mmol/L   CO2 23 22 - 32 mmol/L   Glucose, Bld 79 70 - 99 mg/dL    Comment: Glucose reference range applies only to samples taken after fasting for at least 8 hours.   BUN 12 8 - 23 mg/dL   Creatinine, Ser 0.94 0.61 - 1.24 mg/dL   Calcium 8.8 (L) 8.9 - 10.3 mg/dL  GFR calc non Af Amer >60 >60 mL/min   GFR calc Af Amer >60 >60 mL/min   Anion gap 11 5 - 15    Comment: Performed at [facility], [place] [address] [place], [place] [address]  CBC     Status: Abnormal   Collection Time: 12/27/19  5:30 AM  Result Value Ref Range   WBC 8.9 4.0 - 10.5 K/uL   RBC 3.93 (L) 4.22 - 5.81 MIL/uL   Hemoglobin 12.6 (L) 13.0 - 17.0 g/dL   HCT 39.1 39 - 52 %   MCV 99.5 80.0 - 100.0 fL   MCH 32.1 26.0 - 34.0 pg   MCHC 32.2 30.0 - 36.0 g/dL   RDW 12.0 11.5 - 15.5 %   Platelets 274 150 - 400 K/uL   nRBC 0.0 0.0 - 0.2 %    Comment: Performed at [facility], [place] [address] [place], [place] [address]  Hemoglobin A1c     Status: None   Collection Time: 12/27/19  5:30 AM  Result Value Ref Range   Hgb A1c MFr Bld 5.1 4.8 - 5.6 %    Comment: (NOTE) Pre diabetes:          5.7%-6.4%  Diabetes:              >6.4%  Glycemic control for   <7.0% adults with diabetes    Mean Plasma Glucose 99.67 mg/dL    Comment: Performed at [place] [address], [place], [place] [address]    Imaging / Studies: CT Abdomen Pelvis Wo Contrast  Result Date: 12/26/2019 CLINICAL DATA:  Abdominal pain and abdominal distension. EXAM: CT ABDOMEN AND PELVIS WITHOUT CONTRAST TECHNIQUE: Multidetector CT imaging of the abdomen and pelvis was performed following the standard protocol without IV contrast. COMPARISON:  CT 05/04/2019 FINDINGS: Lower chest: Previous gastric pull-through. Lung bases show mild chronic scarring but are otherwise clear. Hepatobiliary: No liver parenchymal lesion. Multiple gallstones dependent in the gallbladder as seen previously. Pancreas: Normal Spleen: Normal Adrenals/Urinary Tract: Adrenal glands are normal. Left upper pole renal cyst as seen previously. Medial left renal cyst as seen previously. No hydronephrosis. Bladder is normal. Stomach/Bowel: Small-bowel obstruction with dilated fluid and air-filled small intestine. Transition zone probably in the mid ileum. Appendix is normal. No colon pathology. Vascular/Lymphatic: Aortic atherosclerosis. No aneurysm. IVC is normal. No retroperitoneal adenopathy. Reproductive: Enlarged prostate. Other: No free fluid or air. Musculoskeletal: Ordinary degenerative changes with spinal stenosis at L4-5. IMPRESSION: Recurrent small-bowel obstruction, moderate to high-grade. Transition zone in the mid ileum. Cholelithiasis. Aortic Atherosclerosis (ICD10-I70.0). Enlarged prostate. Electronically Signed   By: Nelson Chimes M.D.   On: 12/26/2019 10:47   DG Abd 1 View  Result Date: 12/26/2019 CLINICAL DATA:  NG tube placement.  EXAM: ABDOMEN - 1 VIEW COMPARISON:  Prior same day abdominal radiographs. FINDINGS: Non weighted enteric tube tip and side hole demonstrate midline positioning above the diaphragm. This likely reflects sequela of prior esophagectomy with gastric pull-through, better demonstrated on recent CT. Surgical clips overlie the upper abdomen and cardiomediastinal silhouette. Clear lung bases. Gas within nondilated bowel loops. IMPRESSION: Taking into account patient history of esophagectomy and gastric pull-through, enteric tube likely overlies the gastric body. Electronically Signed   By: Primitivo Gauze M.D.   On: 12/26/2019 17:11   DG Abd Portable 1V-Small Bowel Obstruction Protocol-initial, 8 hr delay  Result Date: 12/27/2019 CLINICAL DATA:  Small-bowel protocol 8 hours postcontrast EXAM: PORTABLE ABDOMEN - 1 VIEW COMPARISON:  None. FINDINGS: There is no enteric contrast within the visible small bowel or colon. No dilated small bowel. IMPRESSION: No enteric contrast within the visible small bowel or colon. Electronically Signed   By: Ulyses Jarred M.D.   On: 12/27/2019 02:31   DG Abd Portable 1V-Small Bowel Protocol-Position Verification  Result Date: 12/26/2019 CLINICAL DATA:  Enteric catheter placement, esophageal cancer status post esophagectomy EXAM: PORTABLE ABDOMEN - 1 VIEW COMPARISON:  12/26/2019 FINDINGS: Single frontal view of the lower chest and upper abdomen demonstrates enteric catheter coiled over the distal aspect of the stomach in a patient with a history of esophagectomy and gastric pull-through procedure. The tip of the catheter projects at the level of the diaphragmatic hiatus. Cardiac silhouette is unremarkable. No airspace disease, effusion, or pneumothorax. The dilated fluid-filled loops of bowel seen on earlier CT are less well visualized on x-ray. IMPRESSION: 1. Enteric catheter coiled over the lower chest, likely within the gastric lumen in this patient with history of esophagectomy  and gastric pull-through procedure. Electronically Signed   By: Randa Ngo M.D.   On: 12/26/2019 15:01   DG Abd Portable 1V-Small Bowel Protocol-Position Verification  Result Date: 12/26/2019 CLINICAL DATA:  History of intestinal obstruction. Abdominal pain. History of esophageal cancer. EXAM: PORTABLE ABDOMEN - 1 VIEW COMPARISON:  12/26/2019.  CT 12/26/2019. FINDINGS: Interim readjustment of NG tube. NG tube again noted coiled in hiatal hernia. Previously identified small bowel distention is again noted and is again diminished from prior CT of 12/26/2019. Surgical clips upper abdomen. IMPRESSION: 1. Interim readjustment of NG tube. NG tube remains coiled in the patient's hiatal hernia. 2. Previously identified small bowel distention is again noted and is again diminished from prior CT of 12/26/2019. Electronically Signed   By: Marcello Moores  Register   On: 12/26/2019 13:51   DG Abd Portable 1V-Small Bowel Protocol-Position Verification  Result Date: 12/26/2019 CLINICAL DATA:  NG tube placement. EXAM: PORTABLE ABDOMEN - 1 VIEW COMPARISON:  CT 12/26/2019. FINDINGS: NG tube noted with tip coiled in a hiatal hernia. Decreased small-bowel distention from prior study. No free air. Degenerative changes scoliosis thoracic spine. Surgical clips left upper quadrant. IMPRESSION: NG tube noted with tip coiled in a hiatal hernia. Decreased small-bowel distention from prior study. Electronically Signed   By: Marcello Moores  Register   On: 12/26/2019 13:02     .Adin Hector, M.D., F.A.C.S. Gastrointestinal and Minimally Invasive Surgery [facility] [address] [place], [place] [address] [phone] Main / Paging  12/27/2019 9:42 AM    Adin Hector

## 2019-12-27 NOTE — Assessment & Plan Note (Signed)
-   no PPI currently; if symptomatic will initiate PPI

## 2019-12-27 NOTE — Op Note (Addendum)
PATIENT:  Oscar [name]  86 y.o. male  Patient Care Team: Deland Pretty, MD as PCP - General (Internal Medicine) Debara Pickett Nadean Corwin, MD as Consulting Physician (Cardiology) Irene Shipper, MD as Consulting Physician (Gastroenterology) Eunice Blase, MD (Family Medicine)  PRE-OPERATIVE DIAGNOSIS:  RECURRENT SMALL BOWEL OBSTRUCTION  POST-OPERATIVE DIAGNOSIS:  RECURRENT SMALL BOWEL OBSTRUCTION  PROCEDURE:  LAPAROSCOPY DIAGNOSTIC LYSIS OF ADHESIONS REMOVAL OF SKIN ACROCHORDON  SURGEON:  Adin Hector, MD  ANESTHESIA:   local and general  EBL:  Total I/O In: 250 [IV Piggyback:250] Out: 17 [Blood:20]  Delay start of Pharmacological VTE agent (>24hrs) due to surgical blood loss or risk of bleeding:  no  DRAINS: none   SPECIMEN:  NONE  DISPOSITION OF SPECIMEN:  PATHOLOGY  COUNTS:  YES  PLAN OF CARE: Admit to inpatient   PATIENT DISPOSITION:  PACU - hemodynamically stable.   INDICATIONS: Patient with concerning symptoms & work up suspicious SBO.  Seventh admission.  Given recurrent symptoms surgical consultation and and surgery offered for laparoscopic possible open lysis of adhesions.  The anatomy & physiology of the digestive tract was discussed.  The pathophysiology of intestinal obstruction was discussed.  Natural history risks without surgery was discussed.   I feel the patient has failed non-operative therapies.  The risks of no intervention will lead to serious problems such as necrosis, perforation, dehydration, etc. that outweigh the operative risks; therefore, I recommended abdominal exploration to diagnose & treat the source of the problem.  Minimally Invasive & open techniques were discussed.   I expressed a good likelihood that surgery will treat the problem.  Risks such as bleeding, infection, abscess, leak, reoperation, bowel resection, possible ostomy, hernia, injury to other organs, need for repair of tissues / organs, need for further treatment, heart attack,  death, and other risks were discussed.   I noted a good likelihood this will help address the problem.  Goals of post-operative recovery were discussed as well.  We will work to minimize complications. Questions were answered.  The patient expresses understanding & wishes to proceed with surgery.   OR FINDINGS: Very distended proximal small bowel with moderately dense adhesions to the upper midline.  Couple of hairpin areas.  Segment of jejunum plastered to the parietal peritoneum wall.  Also adhesions from omentum to proximal jejunum causing internal hernia.  Adhesions lysed and bowel untwisted.  CASE DATA:  Type of patient?: LDOW CASE (Surgical Hospitalist [facility] Inpatient)  Status of Case? URGENT Add On  Infection Present At Time Of Surgery (PATOS)?  NO  DESCRIPTION:   Informed consent was confirmed.  The patient underwent general anaesthesia without difficulty.  The patient was positioned appropriately.  VTE prevention in place.  The patient's abdomen was clipped, prepped, & draped in a sterile fashion.  Surgical timeout confirmed our plan.  Patient had pedunculated skin tag near his midline incision with some irritation.  4 mm.  I excised with scalpel.  Consistent with a acrochordon.  Peritoneal entry with a laparoscopic port was obtained using Varess spring needle entry technique in the left upper abdomen as the patient was positioned in reverse Trendelenburg.  I induced carbon dioxide insufflation.  No change in end tidal CO2 measurements.  Full symmetrical abdominal distention.  Initial port was carefully placed.  Camera inspection revealed no injury.  Extra ports were carefully placed under direct laparoscopic visualization.  Patient had moderately dense lesions of omentum and small bowel to the anterior abdominal wall.  Carefully lysed greater omentum off.  Had opening camera placement to see around.  Eventually freed off loops of small intestine and great omentum off the anterior abdominal  wall.  There is a more sharp hairpin area in the left supraumbilical paramedian region.  To inspection and saw no evidence of serosal or other injury.  I then began to run the small bowel from the ileocecal valve more proximally.  Encountered some moderately dense adhesions interloop.  Freed those off.  Had segment of proximal and distal jejunum with some bowel trapped underneath it for an internal hernia.  Freed that off and untwisted it.  Eventually came proximally.  The proximal third of the small bowel very dilated.  Found some greater omentum adherent to the proximal jejunum and freed that off.  That released a small internal hernia as well.  We ran the bowel 2 more times.  Did meticulous inspection and ensured there is no evidence of any serosal injury or other abnormality.  Patient had moderately dense adhesions in the epigastric region consistent with his prior esophagectomy with gastric pull-up.  That was involving a greater omentum and distal transverse colon.  There is no obstruction or turning there.  Some adhesions of the left lateral sector of the liver to the anterior abdominal wall as well.  I left those in place.  No evidence of cholecystitis.  I again ran the small bowel a fourth time from the ileocecal valve proximally to ligament of Treitz and allow the bowel to fall naturally with folds.  Again ensured there is no evidence of any bleeding or bowel injury or other abnormality.  Carbon oxide removed.  Ports removed.  I closed skin using 4-0 monocryl stitch.  Sterile dressings applied.  Patient was extubated and sent to the recovery room.  I discussed operative findings, updated the patient's status, discussed probable steps to recovery, and gave postoperative recommendations to the patient's spouse.  Recommendations were made.  Questions were answered.  She expressed understanding & appreciation.    Adin Hector, M.D., F.A.C.S. Gastrointestinal and Minimally Invasive  Sur[facility] [address] [place], [place] [address] [phone] Main / Paging  12/27/2019 12:13 PM

## 2019-12-27 NOTE — Hospital Course (Addendum)
Mr. Staubs is an 86 yo male with PMH recurrent SBO's, hypertension, hyperlipidemia, gout, type 2 diabetes, BPH, GERD, history of esophageal cancer ( s/p surgical resection 2000, 2001) who presented to the ER with worsening abdominal pain for approximately 2 days. Given his history of recurrent SBO's, he underwent CT abdomen/pelvis.  This revealed a recurrent SBO with moderate to high-grade obstruction with a transition zone noted in the mid ileum.  He was evaluated by surgery and agreed for surgical intervention. He was taken to the OR on 12/27/2019 and underwent exploratory laparoscopy.  He was found to have a significant amount of adhesions which were lysed. On 12/28/2019, his NG tube was removed and his diet was advanced to clear liquids.  He tolerated further advancement of diet and had adequate return of flatus and bowel movements.  He was considered stable for discharging home with ongoing follow-up as needed with surgery.

## 2019-12-27 NOTE — Progress Notes (Signed)
PROGRESS NOTE    Oscar [name]   TZG:[id number]  DOB: December 24, 1932  DOA: 12/26/2019     1  PCP: Deland Pretty, MD  CC: abdominal pain  Hospital Course: [name] is an 86 yo male with PMH recurrent SBO's, hypertension, hyperlipidemia, gout, type 2 diabetes, BPH, GERD, history of esophageal cancer ( s/p surgical resection 2000, 2001) who presented to the ER with worsening abdominal pain for approximately 2 days. Given his history of recurrent SBO's, he underwent CT abdomen/pelvis.  This revealed a recurrent SBO with moderate to high-grade obstruction with a transition zone noted in the mid ileum.  He was evaluated by surgery and agreed for surgical intervention. He was taken to the OR on 12/27/2019 and underwent exploratory laparoscopy.  He was found to have a significant amount of adhesions which were lysed.   Interval History:  Underwent laparoscopic lysis of adhesions today with surgery.  Seen in his room after surgery and was resting comfortably in bed with minimal abdominal pain.  NG tube still in place draining moderate amounts of gastric contents.  He was awake and alert able to provide collateral information as needed.  Old records reviewed in assessment of this patient  ROS: Constitutional: negative for chills and fevers, Respiratory: negative for cough, Cardiovascular: negative for chest pain and Gastrointestinal: positive for abdominal pain  Assessment & Plan: GERD - no PPI currently; if symptomatic will initiate PPI  History of esophageal cancer 2001 - s/p surgery   HTN (hypertension) - hold meds - use labetalol or hydralazine PRN  SBO (small bowel obstruction) - recurrent - multiple admissions for similar - taken for ex lap on 8/20, underwent laparoscopic lysis of adhesions - continue NGT per surgery; remains NPO; hypoactive BS s/p surgery as expected - continue nausea and pain control   AKI (acute kidney injury) ([place]) - continue IVF - transient HyperK has  resolved   Hyponatremia - monitor on IVF  Hyperkalemia - resolved with IVF   Antimicrobials: None  DVT prophylaxis: HSQ Code Status: Full Family Communication: None present Disposition Plan:  Status is: Inpatient  Remains inpatient appropriate because:Ongoing active pain requiring inpatient pain management, Unsafe d/c plan, IV treatments appropriate due to intensity of illness or inability to take PO and Inpatient level of care appropriate due to severity of illness   Dispo: The patient is from: Home              Anticipated d/c is to: Pending PT eval              Anticipated d/c date is: > 3 days              Patient currently is not medically stable to d/c.       Objective: Blood pressure (!) 160/72, pulse 89, temperature 97.9 F (36.6 C), temperature source Oral, resp. rate 13, height 5\' 11"  (1.803 m), weight 81.2 kg, SpO2 100 %.  Examination: General appearance: Pleasant elderly man resting in bed in no distress with NG tube in place Head: Normocephalic, without obvious abnormality, atraumatic Eyes: EOMI Lungs: clear to auscultation bilaterally Heart: regular rate and rhythm and S1, S2 normal Abdomen: Soft, nondistended, appropriately tender to palpation.  Hypoactive bowel sounds.  Surgical incisions x4 from laparoscopy noted on abdomen covered with dressing Extremities: No edema Skin: Skin color, texture, turgor normal. No rashes or lesions or normal Neurologic: Grossly normal  Consultants:   General surgery  Procedures:   12/27/2019, lysis of adhesions, exploratory laparoscopy  Data  Reviewed: I have personally reviewed following labs and imaging studies Results for orders placed or performed during the hospital encounter of 12/26/19 (from the past 24 hour(s))  Basic metabolic panel     Status: Abnormal   Collection Time: 12/26/19  5:25 PM  Result Value Ref Range   Sodium 135 135 - 145 mmol/L   Potassium 4.0 3.5 - 5.1 mmol/L   Chloride 99 98 - 111  mmol/L   CO2 28 22 - 32 mmol/L   Glucose, Bld 103 (H) 70 - 99 mg/dL   BUN 13 8 - 23 mg/dL   Creatinine, Ser 0.99 0.61 - 1.24 mg/dL   Calcium 9.0 8.9 - 10.3 mg/dL   GFR calc non Af Amer >60 >60 mL/min   GFR calc Af Amer >60 >60 mL/min   Anion gap 8 5 - 15  Phosphorus     Status: None   Collection Time: 12/26/19  5:25 PM  Result Value Ref Range   Phosphorus 3.4 2.5 - 4.6 mg/dL  Basic metabolic panel     Status: Abnormal   Collection Time: 12/27/19  5:30 AM  Result Value Ref Range   Sodium 136 135 - 145 mmol/L   Potassium 4.0 3.5 - 5.1 mmol/L   Chloride 102 98 - 111 mmol/L   CO2 23 22 - 32 mmol/L   Glucose, Bld 79 70 - 99 mg/dL   BUN 12 8 - 23 mg/dL   Creatinine, Ser 0.94 0.61 - 1.24 mg/dL   Calcium 8.8 (L) 8.9 - 10.3 mg/dL   GFR calc non Af Amer >60 >60 mL/min   GFR calc Af Amer >60 >60 mL/min   Anion gap 11 5 - 15  CBC     Status: Abnormal   Collection Time: 12/27/19  5:30 AM  Result Value Ref Range   WBC 8.9 4.0 - 10.5 K/uL   RBC 3.93 (L) 4.22 - 5.81 MIL/uL   Hemoglobin 12.6 (L) 13.0 - 17.0 g/dL   HCT 39.1 39 - 52 %   MCV 99.5 80.0 - 100.0 fL   MCH 32.1 26.0 - 34.0 pg   MCHC 32.2 30.0 - 36.0 g/dL   RDW 12.0 11.5 - 15.5 %   Platelets 274 150 - 400 K/uL   nRBC 0.0 0.0 - 0.2 %  Hemoglobin A1c     Status: None   Collection Time: 12/27/19  5:30 AM  Result Value Ref Range   Hgb A1c MFr Bld 5.1 4.8 - 5.6 %   Mean Plasma Glucose 99.67 mg/dL    Recent Results (from the past 240 hour(s))  SARS Coronavirus 2 by RT PCR (hospital order, performed in [place] hospital lab) Nasopharyngeal Nasopharyngeal Swab     Status: None   Collection Time: 12/26/19  9:56 AM   Specimen: Nasopharyngeal Swab  Result Value Ref Range Status   SARS Coronavirus 2 NEGATIVE NEGATIVE Final    Comment: (NOTE) SARS-CoV-2 target nucleic acids are NOT DETECTED.  The SARS-CoV-2 RNA is generally detectable in upper and lower respiratory specimens during the acute phase of infection. The  lowest concentration of SARS-CoV-2 viral copies this assay can detect is 250 copies / mL. A negative result does not preclude SARS-CoV-2 infection and should not be used as the sole basis for treatment or other patient management decisions.  A negative result may occur with improper specimen collection / handling, submission of specimen other than nasopharyngeal swab, presence of viral mutation(s) within the areas targeted by this assay, and inadequate number  of viral copies (<250 copies / mL). A negative result must be combined with clinical observations, patient history, and epidemiological information.  Fact Sheet for Patients:   StrictlyIdeas.no  Fact Sheet for Healthcare Providers: BankingDealers.co.za  This test is not yet approved or  cleared by the [place] FDA and has been authorized for detection and/or diagnosis of SARS-CoV-2 by FDA under an Emergency Use Authorization (EUA).  This EUA will remain in effect (meaning this test can be used) for the duration of the COVID-19 declaration under Section 564(b)(1) of the Act, 21 U.S.C. section 360bbb-3(b)(1), unless the authorization is terminated or revoked sooner.  Performed at [facility], [place] [address] [place], [place] [address]      Radiology Studies: CT Abdomen Pelvis Wo Contrast  Result Date: 12/26/2019 CLINICAL DATA:  Abdominal pain and abdominal distension. EXAM: CT ABDOMEN AND PELVIS WITHOUT CONTRAST TECHNIQUE: Multidetector CT imaging of the abdomen and pelvis was performed following the standard protocol without IV contrast. COMPARISON:  CT 05/04/2019 FINDINGS: Lower chest: Previous gastric pull-through. Lung bases show mild chronic scarring but are otherwise clear. Hepatobiliary: No liver parenchymal lesion. Multiple gallstones dependent in the gallbladder as seen previously. Pancreas: Normal Spleen: Normal Adrenals/Urinary Tract: Adrenal glands  are normal. Left upper pole renal cyst as seen previously. Medial left renal cyst as seen previously. No hydronephrosis. Bladder is normal. Stomach/Bowel: Small-bowel obstruction with dilated fluid and air-filled small intestine. Transition zone probably in the mid ileum. Appendix is normal. No colon pathology. Vascular/Lymphatic: Aortic atherosclerosis. No aneurysm. IVC is normal. No retroperitoneal adenopathy. Reproductive: Enlarged prostate. Other: No free fluid or air. Musculoskeletal: Ordinary degenerative changes with spinal stenosis at L4-5. IMPRESSION: Recurrent small-bowel obstruction, moderate to high-grade. Transition zone in the mid ileum. Cholelithiasis. Aortic Atherosclerosis (ICD10-I70.0). Enlarged prostate. Electronically Signed   By: Nelson Chimes M.D.   On: 12/26/2019 10:47   DG Abd 1 View  Result Date: 12/26/2019 CLINICAL DATA:  NG tube placement. EXAM: ABDOMEN - 1 VIEW COMPARISON:  Prior same day abdominal radiographs. FINDINGS: Non weighted enteric tube tip and side hole demonstrate midline positioning above the diaphragm. This likely reflects sequela of prior esophagectomy with gastric pull-through, better demonstrated on recent CT. Surgical clips overlie the upper abdomen and cardiomediastinal silhouette. Clear lung bases. Gas within nondilated bowel loops. IMPRESSION: Taking into account patient history of esophagectomy and gastric pull-through, enteric tube likely overlies the gastric body. Electronically Signed   By: Primitivo Gauze M.D.   On: 12/26/2019 17:11   DG Abd Portable 1V-Small Bowel Obstruction Protocol-initial, 8 hr delay  Result Date: 12/27/2019 CLINICAL DATA:  Small-bowel protocol 8 hours postcontrast EXAM: PORTABLE ABDOMEN - 1 VIEW COMPARISON:  None. FINDINGS: There is no enteric contrast within the visible small bowel or colon. No dilated small bowel. IMPRESSION: No enteric contrast within the visible small bowel or colon. Electronically Signed   By: Ulyses Jarred  M.D.   On: 12/27/2019 02:31   DG Abd Portable 1V-Small Bowel Protocol-Position Verification  Result Date: 12/26/2019 CLINICAL DATA:  Enteric catheter placement, esophageal cancer status post esophagectomy EXAM: PORTABLE ABDOMEN - 1 VIEW COMPARISON:  12/26/2019 FINDINGS: Single frontal view of the lower chest and upper abdomen demonstrates enteric catheter coiled over the distal aspect of the stomach in a patient with a history of esophagectomy and gastric pull-through procedure. The tip of the catheter projects at the level of the diaphragmatic hiatus. Cardiac silhouette is unremarkable. No airspace disease, effusion, or pneumothorax. The dilated fluid-filled loops of bowel seen  on earlier CT are less well visualized on x-ray. IMPRESSION: 1. Enteric catheter coiled over the lower chest, likely within the gastric lumen in this patient with history of esophagectomy and gastric pull-through procedure. Electronically Signed   By: Randa Ngo M.D.   On: 12/26/2019 15:01   DG Abd Portable 1V-Small Bowel Protocol-Position Verification  Result Date: 12/26/2019 CLINICAL DATA:  History of intestinal obstruction. Abdominal pain. History of esophageal cancer. EXAM: PORTABLE ABDOMEN - 1 VIEW COMPARISON:  12/26/2019.  CT 12/26/2019. FINDINGS: Interim readjustment of NG tube. NG tube again noted coiled in hiatal hernia. Previously identified small bowel distention is again noted and is again diminished from prior CT of 12/26/2019. Surgical clips upper abdomen. IMPRESSION: 1. Interim readjustment of NG tube. NG tube remains coiled in the patient's hiatal hernia. 2. Previously identified small bowel distention is again noted and is again diminished from prior CT of 12/26/2019. Electronically Signed   By: Marcello Moores  Register   On: 12/26/2019 13:51   DG Abd Portable 1V-Small Bowel Protocol-Position Verification  Result Date: 12/26/2019 CLINICAL DATA:  NG tube placement. EXAM: PORTABLE ABDOMEN - 1 VIEW COMPARISON:  CT  12/26/2019. FINDINGS: NG tube noted with tip coiled in a hiatal hernia. Decreased small-bowel distention from prior study. No free air. Degenerative changes scoliosis thoracic spine. Surgical clips left upper quadrant. IMPRESSION: NG tube noted with tip coiled in a hiatal hernia. Decreased small-bowel distention from prior study. Electronically Signed   By: Marcello Moores  Register   On: 12/26/2019 13:02   DG Abd Portable 1V-Small Bowel Obstruction Protocol-initial, 8 hr delay  Final Result    DG Abd 1 View  Final Result    DG Abd Portable 1V-Small Bowel Protocol-Position Verification  Final Result    DG Abd Portable 1V-Small Bowel Protocol-Position Verification  Final Result    DG Abd Portable 1V-Small Bowel Protocol-Position Verification  Final Result    CT Abdomen Pelvis Wo Contrast  Final Result       Scheduled Meds: . bisacodyl  10 mg Rectal Daily  . heparin  5,000 Units Subcutaneous Q8H  . HYDROmorphone      . HYDROmorphone      . lip balm  1 application Topical BID   PRN Meds: acetaminophen, albuterol, alum & mag hydroxide-simeth, fentaNYL (SUBLIMAZE) injection, guaiFENesin-dextromethorphan, hydrALAZINE, hydrocortisone, hydrocortisone cream, labetalol, lactated ringers, magic mouthwash, menthol-cetylpyridinium, methocarbamol (ROBAXIN) IV, ondansetron (ZOFRAN) IV **OR** ondansetron (ZOFRAN) IV, phenol, prochlorperazine, simethicone, sodium chloride Continuous Infusions: . sodium chloride 125 mL/hr at 12/27/19 0053  . lactated ringers    . methocarbamol (ROBAXIN) IV    . ondansetron (ZOFRAN) IV    . sodium chloride        LOS: 1 day  Time spent: Greater than 50% of the 35 minute visit was spent in counseling/coordination of care for the patient as laid out in the A&P.   Dwyane Dee, MD [facility] 12/27/2019, 5:04 PM   Contact via secure chat.  To contact the attending provider between 7A-7P or the covering provider during after hours 7P-7A, please log into the  web site [email or web address] and access using universal [place] password for that web site. If you do not have the password, please call the hospital operator.

## 2019-12-27 NOTE — Anesthesia Procedure Notes (Signed)
Procedure Name: Intubation Date/Time: 12/27/2019 10:07 AM Performed by: Eben Burow, CRNA Pre-anesthesia Checklist: Patient identified, Emergency Drugs available, Suction available, Patient being monitored and Timeout performed Patient Re-evaluated:Patient Re-evaluated prior to induction Oxygen Delivery Method: Circle system utilized Preoxygenation: Pre-oxygenation with 100% oxygen Induction Type: IV induction, Rapid sequence and Cricoid Pressure applied Laryngoscope Size: Mac and 4 Grade View: Grade I Tube type: Oral Tube size: 7.5 mm Number of attempts: 1 Airway Equipment and Method: Stylet Placement Confirmation: ETT inserted through vocal cords under direct vision,  positive ETCO2 and breath sounds checked- equal and bilateral Secured at: 23 cm Tube secured with: Tape Dental Injury: Teeth and Oropharynx as per pre-operative assessment

## 2019-12-27 NOTE — H&P (View-Only) (Signed)
[facility] Progress Note  Day of Surgery  Subjective: CC-  Still having some abdominal discomfort requiring pain medication. Pain is mostly periumbilical and to the right. Passed some gas yesterday but none today. States that he has been burping. No BM. NG tube with 850cc out.  Objective: Vital signs in last 24 hours: Temp:  [97.5 F (36.4 C)-98.2 F (36.8 C)] 97.9 F (36.6 C) (08/20 0154) Pulse Rate:  [59-83] 74 (08/20 0535) Resp:  [13-20] 17 (08/20 0535) BP: (131-171)/(64-92) 148/92 (08/20 0535) SpO2:  [95 %-100 %] 99 % (08/20 0535) Weight:  [81.2 kg] 81.2 kg (08/19 1713) Last BM Date: 12/25/19  Intake/Output from previous day: 08/19 0701 - 08/20 0700 In: 1753.4 [I.V.:1753.4] Out: 1640 [Urine:790; Emesis/NG output:850] Intake/Output this shift: Total I/O In: 1753.4 [I.V.:1753.4] Out: 1440 [Urine:590; Emesis/NG output:850]  PE: Gen:  Alert, NAD, pleasant HEENT: EOM's intact, pupils equal and round Card:  RRR Pulm:  CTAB, no W/R/R, rate and effort normal Abd: Soft, ND, mild periumbilical TTP without rebound or guarding, few BS heard, no HSM, no hernia Psych: A&Ox3 Skin: no rashes noted, warm and dry  Lab Results:  Recent Labs    12/26/19 1005 12/27/19 0530  WBC 15.9* 8.9  HGB 14.4 12.6*  HCT 43.2 39.1  PLT 340 274   BMET Recent Labs    12/26/19 1725 12/27/19 0530  NA 135 136  K 4.0 4.0  CL 99 102  CO2 28 23  GLUCOSE 103* 79  BUN 13 12  CREATININE 0.99 0.94  CALCIUM 9.0 8.8*   PT/INR No results for input(s): LABPROT, INR in the last 72 hours. CMP     Component Value Date/Time   NA 136 12/27/2019 0530   K 4.0 12/27/2019 0530   CL 102 12/27/2019 0530   CO2 23 12/27/2019 0530   GLUCOSE 79 12/27/2019 0530   BUN 12 12/27/2019 0530   CREATININE 0.94 12/27/2019 0530   CREATININE 1.13 12/07/2012 1147   CALCIUM 8.8 (L) 12/27/2019 0530   PROT 8.1 12/26/2019 1005   ALBUMIN 4.7 12/26/2019 1005   AST 38 12/26/2019 1005   ALT 10  12/26/2019 1005   ALKPHOS 70 12/26/2019 1005   BILITOT 1.7 (H) 12/26/2019 1005   GFRNONAA >60 12/27/2019 0530   GFRAA >60 12/27/2019 0530   Lipase     Component Value Date/Time   LIPASE 23 12/26/2019 1005       Studies/Results: CT Abdomen Pelvis Wo Contrast  Result Date: 12/26/2019 CLINICAL DATA:  Abdominal pain and abdominal distension. EXAM: CT ABDOMEN AND PELVIS WITHOUT CONTRAST TECHNIQUE: Multidetector CT imaging of the abdomen and pelvis was performed following the standard protocol without IV contrast. COMPARISON:  CT 05/04/2019 FINDINGS: Lower chest: Previous gastric pull-through. Lung bases show mild chronic scarring but are otherwise clear. Hepatobiliary: No liver parenchymal lesion. Multiple gallstones dependent in the gallbladder as seen previously. Pancreas: Normal Spleen: Normal Adrenals/Urinary Tract: Adrenal glands are normal. Left upper pole renal cyst as seen previously. Medial left renal cyst as seen previously. No hydronephrosis. Bladder is normal. Stomach/Bowel: Small-bowel obstruction with dilated fluid and air-filled small intestine. Transition zone probably in the mid ileum. Appendix is normal. No colon pathology. Vascular/Lymphatic: Aortic atherosclerosis. No aneurysm. IVC is normal. No retroperitoneal adenopathy. Reproductive: Enlarged prostate. Other: No free fluid or air. Musculoskeletal: Ordinary degenerative changes with spinal stenosis at L4-5. IMPRESSION: Recurrent small-bowel obstruction, moderate to high-grade. Transition zone in the mid ileum. Cholelithiasis. Aortic Atherosclerosis (ICD10-I70.0). Enlarged prostate. Electronically Signed   By: Elta Guadeloupe  Shogry M.D.   On: 12/26/2019 10:47   DG Abd 1 View  Result Date: 12/26/2019 CLINICAL DATA:  NG tube placement. EXAM: ABDOMEN - 1 VIEW COMPARISON:  Prior same day abdominal radiographs. FINDINGS: Non weighted enteric tube tip and side hole demonstrate midline positioning above the diaphragm. This likely reflects  sequela of prior esophagectomy with gastric pull-through, better demonstrated on recent CT. Surgical clips overlie the upper abdomen and cardiomediastinal silhouette. Clear lung bases. Gas within nondilated bowel loops. IMPRESSION: Taking into account patient history of esophagectomy and gastric pull-through, enteric tube likely overlies the gastric body. Electronically Signed   By: Primitivo Gauze M.D.   On: 12/26/2019 17:11   DG Abd Portable 1V-Small Bowel Obstruction Protocol-initial, 8 hr delay  Result Date: 12/27/2019 CLINICAL DATA:  Small-bowel protocol 8 hours postcontrast EXAM: PORTABLE ABDOMEN - 1 VIEW COMPARISON:  None. FINDINGS: There is no enteric contrast within the visible small bowel or colon. No dilated small bowel. IMPRESSION: No enteric contrast within the visible small bowel or colon. Electronically Signed   By: Ulyses Jarred M.D.   On: 12/27/2019 02:31   DG Abd Portable 1V-Small Bowel Protocol-Position Verification  Result Date: 12/26/2019 CLINICAL DATA:  Enteric catheter placement, esophageal cancer status post esophagectomy EXAM: PORTABLE ABDOMEN - 1 VIEW COMPARISON:  12/26/2019 FINDINGS: Single frontal view of the lower chest and upper abdomen demonstrates enteric catheter coiled over the distal aspect of the stomach in a patient with a history of esophagectomy and gastric pull-through procedure. The tip of the catheter projects at the level of the diaphragmatic hiatus. Cardiac silhouette is unremarkable. No airspace disease, effusion, or pneumothorax. The dilated fluid-filled loops of bowel seen on earlier CT are less well visualized on x-ray. IMPRESSION: 1. Enteric catheter coiled over the lower chest, likely within the gastric lumen in this patient with history of esophagectomy and gastric pull-through procedure. Electronically Signed   By: Randa Ngo M.D.   On: 12/26/2019 15:01   DG Abd Portable 1V-Small Bowel Protocol-Position Verification  Result Date:  12/26/2019 CLINICAL DATA:  History of intestinal obstruction. Abdominal pain. History of esophageal cancer. EXAM: PORTABLE ABDOMEN - 1 VIEW COMPARISON:  12/26/2019.  CT 12/26/2019. FINDINGS: Interim readjustment of NG tube. NG tube again noted coiled in hiatal hernia. Previously identified small bowel distention is again noted and is again diminished from prior CT of 12/26/2019. Surgical clips upper abdomen. IMPRESSION: 1. Interim readjustment of NG tube. NG tube remains coiled in the patient's hiatal hernia. 2. Previously identified small bowel distention is again noted and is again diminished from prior CT of 12/26/2019. Electronically Signed   By: Marcello Moores  Register   On: 12/26/2019 13:51   DG Abd Portable 1V-Small Bowel Protocol-Position Verification  Result Date: 12/26/2019 CLINICAL DATA:  NG tube placement. EXAM: PORTABLE ABDOMEN - 1 VIEW COMPARISON:  CT 12/26/2019. FINDINGS: NG tube noted with tip coiled in a hiatal hernia. Decreased small-bowel distention from prior study. No free air. Degenerative changes scoliosis thoracic spine. Surgical clips left upper quadrant. IMPRESSION: NG tube noted with tip coiled in a hiatal hernia. Decreased small-bowel distention from prior study. Electronically Signed   By: Marcello Moores  Register   On: 12/26/2019 13:02    Anti-infectives: Anti-infectives (From admission, onward)   None       Assessment/Plan HTN HLD GERD T2DM Hx of esophageal cancer s/p esophagectomy Hx of gout Hyperkalemia - K 6.2 Hyponatremia  AKI - Cr 1.31 - Per [facility] -    SBO - CT w/ SBO w/ transition  zone in the mid ileum - 7th admission in our system for SBO  FEN - NPO/NGT, IVF VTE - SCDs, sq heparin ID - None   Plan: Patient with persistent SBO symptoms. Recommend diagnostic laparoscopy today.   LOS: 1 day    Wellington Hampshire, [facility] 12/27/2019, 6:59 AM Please see Amion for pager number during day hours 7:00am-4:30pm

## 2019-12-27 NOTE — Assessment & Plan Note (Addendum)
-  Home meds resumed at discharge 

## 2019-12-27 NOTE — Progress Notes (Signed)
[facility] Progress Note  Day of Surgery  Subjective: CC-  Still having some abdominal discomfort requiring pain medication. Pain is mostly periumbilical and to the right. Passed some gas yesterday but none today. States that he has been burping. No BM. NG tube with 850cc out.  Objective: Vital signs in last 24 hours: Temp:  [97.5 F (36.4 C)-98.2 F (36.8 C)] 97.9 F (36.6 C) (08/20 0154) Pulse Rate:  [59-83] 74 (08/20 0535) Resp:  [13-20] 17 (08/20 0535) BP: (131-171)/(64-92) 148/92 (08/20 0535) SpO2:  [95 %-100 %] 99 % (08/20 0535) Weight:  [81.2 kg] 81.2 kg (08/19 1713) Last BM Date: 12/25/19  Intake/Output from previous day: 08/19 0701 - 08/20 0700 In: 1753.4 [I.V.:1753.4] Out: 1640 [Urine:790; Emesis/NG output:850] Intake/Output this shift: Total I/O In: 1753.4 [I.V.:1753.4] Out: 1440 [Urine:590; Emesis/NG output:850]  PE: Gen:  Alert, NAD, pleasant HEENT: EOM's intact, pupils equal and round Card:  RRR Pulm:  CTAB, no W/R/R, rate and effort normal Abd: Soft, ND, mild periumbilical TTP without rebound or guarding, few BS heard, no HSM, no hernia Psych: A&Ox3 Skin: no rashes noted, warm and dry  Lab Results:  Recent Labs    12/26/19 1005 12/27/19 0530  WBC 15.9* 8.9  HGB 14.4 12.6*  HCT 43.2 39.1  PLT 340 274   BMET Recent Labs    12/26/19 1725 12/27/19 0530  NA 135 136  K 4.0 4.0  CL 99 102  CO2 28 23  GLUCOSE 103* 79  BUN 13 12  CREATININE 0.99 0.94  CALCIUM 9.0 8.8*   PT/INR No results for input(s): LABPROT, INR in the last 72 hours. CMP     Component Value Date/Time   NA 136 12/27/2019 0530   K 4.0 12/27/2019 0530   CL 102 12/27/2019 0530   CO2 23 12/27/2019 0530   GLUCOSE 79 12/27/2019 0530   BUN 12 12/27/2019 0530   CREATININE 0.94 12/27/2019 0530   CREATININE 1.13 12/07/2012 1147   CALCIUM 8.8 (L) 12/27/2019 0530   PROT 8.1 12/26/2019 1005   ALBUMIN 4.7 12/26/2019 1005   AST 38 12/26/2019 1005   ALT 10  12/26/2019 1005   ALKPHOS 70 12/26/2019 1005   BILITOT 1.7 (H) 12/26/2019 1005   GFRNONAA >60 12/27/2019 0530   GFRAA >60 12/27/2019 0530   Lipase     Component Value Date/Time   LIPASE 23 12/26/2019 1005       Studies/Results: CT Abdomen Pelvis Wo Contrast  Result Date: 12/26/2019 CLINICAL DATA:  Abdominal pain and abdominal distension. EXAM: CT ABDOMEN AND PELVIS WITHOUT CONTRAST TECHNIQUE: Multidetector CT imaging of the abdomen and pelvis was performed following the standard protocol without IV contrast. COMPARISON:  CT 05/04/2019 FINDINGS: Lower chest: Previous gastric pull-through. Lung bases show mild chronic scarring but are otherwise clear. Hepatobiliary: No liver parenchymal lesion. Multiple gallstones dependent in the gallbladder as seen previously. Pancreas: Normal Spleen: Normal Adrenals/Urinary Tract: Adrenal glands are normal. Left upper pole renal cyst as seen previously. Medial left renal cyst as seen previously. No hydronephrosis. Bladder is normal. Stomach/Bowel: Small-bowel obstruction with dilated fluid and air-filled small intestine. Transition zone probably in the mid ileum. Appendix is normal. No colon pathology. Vascular/Lymphatic: Aortic atherosclerosis. No aneurysm. IVC is normal. No retroperitoneal adenopathy. Reproductive: Enlarged prostate. Other: No free fluid or air. Musculoskeletal: Ordinary degenerative changes with spinal stenosis at L4-5. IMPRESSION: Recurrent small-bowel obstruction, moderate to high-grade. Transition zone in the mid ileum. Cholelithiasis. Aortic Atherosclerosis (ICD10-I70.0). Enlarged prostate. Electronically Signed   By: Elta Guadeloupe  Shogry M.D.   On: 12/26/2019 10:47   DG Abd 1 View  Result Date: 12/26/2019 CLINICAL DATA:  NG tube placement. EXAM: ABDOMEN - 1 VIEW COMPARISON:  Prior same day abdominal radiographs. FINDINGS: Non weighted enteric tube tip and side hole demonstrate midline positioning above the diaphragm. This likely reflects  sequela of prior esophagectomy with gastric pull-through, better demonstrated on recent CT. Surgical clips overlie the upper abdomen and cardiomediastinal silhouette. Clear lung bases. Gas within nondilated bowel loops. IMPRESSION: Taking into account patient history of esophagectomy and gastric pull-through, enteric tube likely overlies the gastric body. Electronically Signed   By: Primitivo Gauze M.D.   On: 12/26/2019 17:11   DG Abd Portable 1V-Small Bowel Obstruction Protocol-initial, 8 hr delay  Result Date: 12/27/2019 CLINICAL DATA:  Small-bowel protocol 8 hours postcontrast EXAM: PORTABLE ABDOMEN - 1 VIEW COMPARISON:  None. FINDINGS: There is no enteric contrast within the visible small bowel or colon. No dilated small bowel. IMPRESSION: No enteric contrast within the visible small bowel or colon. Electronically Signed   By: Ulyses Jarred M.D.   On: 12/27/2019 02:31   DG Abd Portable 1V-Small Bowel Protocol-Position Verification  Result Date: 12/26/2019 CLINICAL DATA:  Enteric catheter placement, esophageal cancer status post esophagectomy EXAM: PORTABLE ABDOMEN - 1 VIEW COMPARISON:  12/26/2019 FINDINGS: Single frontal view of the lower chest and upper abdomen demonstrates enteric catheter coiled over the distal aspect of the stomach in a patient with a history of esophagectomy and gastric pull-through procedure. The tip of the catheter projects at the level of the diaphragmatic hiatus. Cardiac silhouette is unremarkable. No airspace disease, effusion, or pneumothorax. The dilated fluid-filled loops of bowel seen on earlier CT are less well visualized on x-ray. IMPRESSION: 1. Enteric catheter coiled over the lower chest, likely within the gastric lumen in this patient with history of esophagectomy and gastric pull-through procedure. Electronically Signed   By: Randa Ngo M.D.   On: 12/26/2019 15:01   DG Abd Portable 1V-Small Bowel Protocol-Position Verification  Result Date:  12/26/2019 CLINICAL DATA:  History of intestinal obstruction. Abdominal pain. History of esophageal cancer. EXAM: PORTABLE ABDOMEN - 1 VIEW COMPARISON:  12/26/2019.  CT 12/26/2019. FINDINGS: Interim readjustment of NG tube. NG tube again noted coiled in hiatal hernia. Previously identified small bowel distention is again noted and is again diminished from prior CT of 12/26/2019. Surgical clips upper abdomen. IMPRESSION: 1. Interim readjustment of NG tube. NG tube remains coiled in the patient's hiatal hernia. 2. Previously identified small bowel distention is again noted and is again diminished from prior CT of 12/26/2019. Electronically Signed   By: Marcello Moores  Register   On: 12/26/2019 13:51   DG Abd Portable 1V-Small Bowel Protocol-Position Verification  Result Date: 12/26/2019 CLINICAL DATA:  NG tube placement. EXAM: PORTABLE ABDOMEN - 1 VIEW COMPARISON:  CT 12/26/2019. FINDINGS: NG tube noted with tip coiled in a hiatal hernia. Decreased small-bowel distention from prior study. No free air. Degenerative changes scoliosis thoracic spine. Surgical clips left upper quadrant. IMPRESSION: NG tube noted with tip coiled in a hiatal hernia. Decreased small-bowel distention from prior study. Electronically Signed   By: Marcello Moores  Register   On: 12/26/2019 13:02    Anti-infectives: Anti-infectives (From admission, onward)   None       Assessment/Plan HTN HLD GERD T2DM Hx of esophageal cancer s/p esophagectomy Hx of gout Hyperkalemia - K 6.2 Hyponatremia  AKI - Cr 1.31 - Per [facility] -    SBO - CT w/ SBO w/ transition  zone in the mid ileum - 7th admission in our system for SBO  FEN - NPO/NGT, IVF VTE - SCDs, sq heparin ID - None   Plan: Patient with persistent SBO symptoms. Recommend diagnostic laparoscopy today.   LOS: 1 day    Wellington Hampshire, [facility] 12/27/2019, 6:59 AM Please see Amion for pager number during day hours 7:00am-4:30pm

## 2019-12-27 NOTE — Anesthesia Postprocedure Evaluation (Signed)
Anesthesia Post Note  Patient: Oscar [name]  Procedure(s) Performed: LAPAROSCOPY DIAGNOSTI; LYSIS OF ADHESIONS (N/A )     Patient location during evaluation: PACU Anesthesia Type: General Level of consciousness: awake and alert Pain management: pain level controlled Vital Signs Assessment: post-procedure vital signs reviewed and stable Respiratory status: spontaneous breathing, nonlabored ventilation, respiratory function stable and patient connected to nasal cannula oxygen Cardiovascular status: blood pressure returned to baseline and stable Postop Assessment: no apparent nausea or vomiting Anesthetic complications: no   No complications documented.  Last Vitals:  Vitals:   12/27/19 1315 12/27/19 1334  BP: (!) 157/70 (!) 160/72  Pulse: 89 89  Resp: 13 13  Temp: 36.7 C 36.6 C  SpO2: 99% 100%    Last Pain:  Vitals:   12/27/19 1334  TempSrc: Oral  PainSc:                  Merlinda Frederick

## 2019-12-27 NOTE — Assessment & Plan Note (Signed)
-   s/p surgery

## 2019-12-27 NOTE — Assessment & Plan Note (Addendum)
-   multiple admissions for similar - taken for ex lap on 8/20, underwent laparoscopic lysis of adhesions -Surgery following, appreciate assistance -NGT removed on 12/28/2019.  Diet advanced to clear liquids on 8/21 and now soft diet on 8/22; he has had good return of bowel function and has flatus and repeat BM on 8/23 -Continue encouraging ambulation and out of bed -Continue nausea and pain control

## 2019-12-27 NOTE — Assessment & Plan Note (Signed)
-   monitor on IVF

## 2019-12-27 NOTE — Assessment & Plan Note (Signed)
-   resolved with IVF 

## 2019-12-27 NOTE — Transfer of Care (Signed)
Immediate Anesthesia Transfer of Care Note  Patient: Oscar [name]  Procedure(s) Performed: LAPAROSCOPY DIAGNOSTI; LYSIS OF ADHESIONS (N/A )  Patient Location: PACU  Anesthesia Type:General  Level of Consciousness: drowsy and patient cooperative  Airway & Oxygen Therapy: Patient Spontanous Breathing and Patient connected to face mask oxygen  Post-op Assessment: Report given to RN and Post -op Vital signs reviewed and stable  Post vital signs: Reviewed and stable  Last Vitals:  Vitals Value Taken Time  BP 172/90 12/27/19 1211  Temp    Pulse 80 12/27/19 1213  Resp 15 12/27/19 1213  SpO2 100 % 12/27/19 1213  Vitals shown include unvalidated device data.  Last Pain:  Vitals:   12/27/19 0918  TempSrc: Oral  PainSc:       Patients Stated Pain Goal: 2 (58/59/29 2446)  Complications: No complications documented.

## 2019-12-28 ENCOUNTER — Encounter (HOSPITAL_COMMUNITY): Payer: Self-pay | Admitting: Surgery

## 2019-12-28 LAB — CBC WITH DIFFERENTIAL/PLATELET
Abs Immature Granulocytes: 0.04 10*3/uL (ref 0.00–0.07)
Basophils Absolute: 0 10*3/uL (ref 0.0–0.1)
Basophils Relative: 0 %
Eosinophils Absolute: 0 10*3/uL (ref 0.0–0.5)
Eosinophils Relative: 0 %
HCT: 34.2 % — ABNORMAL LOW (ref 39.0–52.0)
Hemoglobin: 11 g/dL — ABNORMAL LOW (ref 13.0–17.0)
Immature Granulocytes: 0 %
Lymphocytes Relative: 10 %
Lymphs Abs: 1.4 10*3/uL (ref 0.7–4.0)
MCH: 31.7 pg (ref 26.0–34.0)
MCHC: 32.2 g/dL (ref 30.0–36.0)
MCV: 98.6 fL (ref 80.0–100.0)
Monocytes Absolute: 1.3 10*3/uL — ABNORMAL HIGH (ref 0.1–1.0)
Monocytes Relative: 9 %
Neutro Abs: 10.9 10*3/uL — ABNORMAL HIGH (ref 1.7–7.7)
Neutrophils Relative %: 81 %
Platelets: 257 10*3/uL (ref 150–400)
RBC: 3.47 MIL/uL — ABNORMAL LOW (ref 4.22–5.81)
RDW: 11.9 % (ref 11.5–15.5)
WBC: 13.7 10*3/uL — ABNORMAL HIGH (ref 4.0–10.5)
nRBC: 0 % (ref 0.0–0.2)

## 2019-12-28 LAB — BASIC METABOLIC PANEL
Anion gap: 13 (ref 5–15)
BUN: 13 mg/dL (ref 8–23)
CO2: 23 mmol/L (ref 22–32)
Calcium: 8.6 mg/dL — ABNORMAL LOW (ref 8.9–10.3)
Chloride: 104 mmol/L (ref 98–111)
Creatinine, Ser: 1.01 mg/dL (ref 0.61–1.24)
GFR calc Af Amer: >60 mL/min (ref >60)
GFR calc non Af Amer: >60 mL/min (ref >60)
Glucose, Bld: 83 mg/dL (ref 70–99)
Potassium: 4.4 mmol/L (ref 3.5–5.1)
Sodium: 140 mmol/L (ref 135–145)

## 2019-12-28 LAB — MAGNESIUM: Magnesium: 1.7 mg/dL (ref 1.7–2.4)

## 2019-12-28 LAB — PHOSPHORUS: Phosphorus: 3.5 mg/dL (ref 2.5–4.6)

## 2019-12-28 MED ORDER — ACETAMINOPHEN 325 MG PO TABS: 650.0000 mg | ORAL_TABLET | ORAL | Status: DC | PRN

## 2019-12-28 MED ORDER — OXYCODONE HCL 5 MG PO TABS
5.0000 mg | ORAL_TABLET | ORAL | Status: DC | PRN
  Administered 2019-12-28 – 2019-12-30 (×4): 5 mg via ORAL
  Filled 2019-12-28 (×4): qty 1

## 2019-12-28 NOTE — Progress Notes (Signed)
PROGRESS NOTE    Oscar [name]   LNL:[id number]  DOB: 1932-07-08  DOA: 12/26/2019     2  PCP: Deland Pretty, MD  CC: abdominal pain  Hospital Course: Oscar [name] is an 86 yo male with PMH recurrent SBO's, hypertension, hyperlipidemia, gout, type 2 diabetes, BPH, GERD, history of esophageal cancer ( s/p surgical resection 2000, 2001) who presented to the ER with worsening abdominal pain for approximately 2 days. Given his history of recurrent SBO's, he underwent CT abdomen/pelvis.  This revealed a recurrent SBO with moderate to high-grade obstruction with a transition zone noted in the mid ileum.  He was evaluated by surgery and agreed for surgical intervention. He was taken to the OR on 12/27/2019 and underwent exploratory laparoscopy.  He was found to have a significant amount of adhesions which were lysed. On 12/28/2019, his NG tube was removed and his diet was advanced to clear liquids.   Interval History:  Underwent laparoscopic lysis of adhesions on 8/20 with surgery.  No events overnight.  Pain is relatively controlled.  He had his NG tube removed this morning and was started on clear liquids.  He denied any nausea or vomiting when seen.  Endorses having some rumbling in his stomach but does not sound like true flatus.  Old records reviewed in assessment of this patient  ROS: Constitutional: negative for chills and fevers, Respiratory: negative for cough, Cardiovascular: negative for chest pain and Gastrointestinal: positive for abdominal pain  Assessment & Plan: GERD - no PPI currently; if symptomatic will initiate PPI  History of esophageal cancer 2001 - s/p surgery   HTN (hypertension) - hold meds - use labetalol or hydralazine PRN  SBO (small bowel obstruction) - recurrent - multiple admissions for similar - taken for ex lap on 8/20, underwent laparoscopic lysis of adhesions -Surgery following, appreciate assistance -NGT removed on 12/28/2019.  Diet advanced to  clear liquids -Continue encouraging ambulation and out of bed -Continue nausea and pain control  AKI (acute kidney injury) ([place]) - continue IVF - transient HyperK has resolved   Hyponatremia - monitor on IVF  Hyperkalemia - resolved with IVF   Antimicrobials: None  DVT prophylaxis: HSQ Code Status: Full Family Communication: None present Disposition Plan:  Status is: Inpatient  Remains inpatient appropriate because:Ongoing active pain requiring inpatient pain management, Unsafe d/c plan, IV treatments appropriate due to intensity of illness or inability to take PO and Inpatient level of care appropriate due to severity of illness   Dispo: The patient is from: Home              Anticipated d/c is to: Pending PT eval              Anticipated d/c date is: > 3 days              Patient currently is not medically stable to d/c.   Objective: Blood pressure (!) 152/59, pulse 82, temperature 98.1 F (36.7 C), temperature source Oral, resp. rate 16, height 5\' 11"  (1.803 m), weight 81.2 kg, SpO2 98 %.  Examination: General appearance: Pleasant elderly man resting in bed in no distress.  NGT now out Head: Normocephalic, without obvious abnormality, atraumatic Eyes: EOMI Lungs: clear to auscultation bilaterally Heart: regular rate and rhythm and S1, S2 normal Abdomen: Soft, nondistended, appropriately tender to palpation.  Very hypoactive bowel sounds.  Surgical incisions x4 from laparoscopy noted on abdomen covered with dressing Extremities: No edema Skin: Skin color, texture, turgor normal. No rashes or  lesions or normal Neurologic: Grossly normal  Consultants:   General surgery  Procedures:   12/27/2019, lysis of adhesions, exploratory laparoscopy  Data Reviewed: I have personally reviewed following labs and imaging studies Results for orders placed or performed during the hospital encounter of 12/26/19 (from the past 24 hour(s))  Basic metabolic panel     Status:  Abnormal   Collection Time: 12/28/19  6:44 AM  Result Value Ref Range   Sodium 140 135 - 145 mmol/L   Potassium 4.4 3.5 - 5.1 mmol/L   Chloride 104 98 - 111 mmol/L   CO2 23 22 - 32 mmol/L   Glucose, Bld 83 70 - 99 mg/dL   BUN 13 8 - 23 mg/dL   Creatinine, Ser 1.01 0.61 - 1.24 mg/dL   Calcium 8.6 (L) 8.9 - 10.3 mg/dL   GFR calc non Af Amer >60 >60 mL/min   GFR calc Af Amer >60 >60 mL/min   Anion gap 13 5 - 15  Magnesium     Status: None   Collection Time: 12/28/19  6:44 AM  Result Value Ref Range   Magnesium 1.7 1.7 - 2.4 mg/dL  CBC with Differential/Platelet     Status: Abnormal   Collection Time: 12/28/19  6:44 AM  Result Value Ref Range   WBC 13.7 (H) 4.0 - 10.5 K/uL   RBC 3.47 (L) 4.22 - 5.81 MIL/uL   Hemoglobin 11.0 (L) 13.0 - 17.0 g/dL   HCT 34.2 (L) 39 - 52 %   MCV 98.6 80.0 - 100.0 fL   MCH 31.7 26.0 - 34.0 pg   MCHC 32.2 30.0 - 36.0 g/dL   RDW 11.9 11.5 - 15.5 %   Platelets 257 150 - 400 K/uL   nRBC 0.0 0.0 - 0.2 %   Neutrophils Relative % 81 %   Neutro Abs 10.9 (H) 1.7 - 7.7 K/uL   Lymphocytes Relative 10 %   Lymphs Abs 1.4 0.7 - 4.0 K/uL   Monocytes Relative 9 %   Monocytes Absolute 1.3 (H) 0 - 1 K/uL   Eosinophils Relative 0 %   Eosinophils Absolute 0.0 0 - 0 K/uL   Basophils Relative 0 %   Basophils Absolute 0.0 0 - 0 K/uL   Immature Granulocytes 0 %   Abs Immature Granulocytes 0.04 0.00 - 0.07 K/uL  Phosphorus     Status: None   Collection Time: 12/28/19  6:44 AM  Result Value Ref Range   Phosphorus 3.5 2.5 - 4.6 mg/dL    Recent Results (from the past 240 hour(s))  SARS Coronavirus 2 by RT PCR (hospital order, performed in [place] hospital lab) Nasopharyngeal Nasopharyngeal Swab     Status: None   Collection Time: 12/26/19  9:56 AM   Specimen: Nasopharyngeal Swab  Result Value Ref Range Status   SARS Coronavirus 2 NEGATIVE NEGATIVE Final    Comment: (NOTE) SARS-CoV-2 target nucleic acids are NOT DETECTED.  The SARS-CoV-2 RNA is generally  detectable in upper and lower respiratory specimens during the acute phase of infection. The lowest concentration of SARS-CoV-2 viral copies this assay can detect is 250 copies / mL. A negative result does not preclude SARS-CoV-2 infection and should not be used as the sole basis for treatment or other patient management decisions.  A negative result may occur with improper specimen collection / handling, submission of specimen other than nasopharyngeal swab, presence of viral mutation(s) within the areas targeted by this assay, and inadequate number of viral copies (<250 copies /  mL). A negative result must be combined with clinical observations, patient history, and epidemiological information.  Fact Sheet for Patients:   StrictlyIdeas.no  Fact Sheet for Healthcare Providers: BankingDealers.co.za  This test is not yet approved or  cleared by the [place] FDA and has been authorized for detection and/or diagnosis of SARS-CoV-2 by FDA under an Emergency Use Authorization (EUA).  This EUA will remain in effect (meaning this test can be used) for the duration of the COVID-19 declaration under Section 564(b)(1) of the Act, 21 U.S.C. section 360bbb-3(b)(1), unless the authorization is terminated or revoked sooner.  Performed at [facility], [place] [address], [place], [place] [address]      Radiology Studies: DG Abd 1 View  Result Date: 12/26/2019 CLINICAL DATA:  NG tube placement. EXAM: ABDOMEN - 1 VIEW COMPARISON:  Prior same day abdominal radiographs. FINDINGS: Non weighted enteric tube tip and side hole demonstrate midline positioning above the diaphragm. This likely reflects sequela of prior esophagectomy with gastric pull-through, better demonstrated on recent CT. Surgical clips overlie the upper abdomen and cardiomediastinal silhouette. Clear lung bases. Gas within nondilated bowel loops. IMPRESSION: Taking into  account patient history of esophagectomy and gastric pull-through, enteric tube likely overlies the gastric body. Electronically Signed   By: Primitivo Gauze M.D.   On: 12/26/2019 17:11   DG Abd Portable 1V-Small Bowel Obstruction Protocol-initial, 8 hr delay  Result Date: 12/27/2019 CLINICAL DATA:  Small-bowel protocol 8 hours postcontrast EXAM: PORTABLE ABDOMEN - 1 VIEW COMPARISON:  None. FINDINGS: There is no enteric contrast within the visible small bowel or colon. No dilated small bowel. IMPRESSION: No enteric contrast within the visible small bowel or colon. Electronically Signed   By: Ulyses Jarred M.D.   On: 12/27/2019 02:31   DG Abd Portable 1V-Small Bowel Protocol-Position Verification  Result Date: 12/26/2019 CLINICAL DATA:  Enteric catheter placement, esophageal cancer status post esophagectomy EXAM: PORTABLE ABDOMEN - 1 VIEW COMPARISON:  12/26/2019 FINDINGS: Single frontal view of the lower chest and upper abdomen demonstrates enteric catheter coiled over the distal aspect of the stomach in a patient with a history of esophagectomy and gastric pull-through procedure. The tip of the catheter projects at the level of the diaphragmatic hiatus. Cardiac silhouette is unremarkable. No airspace disease, effusion, or pneumothorax. The dilated fluid-filled loops of bowel seen on earlier CT are less well visualized on x-ray. IMPRESSION: 1. Enteric catheter coiled over the lower chest, likely within the gastric lumen in this patient with history of esophagectomy and gastric pull-through procedure. Electronically Signed   By: Randa Ngo M.D.   On: 12/26/2019 15:01   DG Abd Portable 1V-Small Bowel Protocol-Position Verification  Result Date: 12/26/2019 CLINICAL DATA:  History of intestinal obstruction. Abdominal pain. History of esophageal cancer. EXAM: PORTABLE ABDOMEN - 1 VIEW COMPARISON:  12/26/2019.  CT 12/26/2019. FINDINGS: Interim readjustment of NG tube. NG tube again noted coiled in  hiatal hernia. Previously identified small bowel distention is again noted and is again diminished from prior CT of 12/26/2019. Surgical clips upper abdomen. IMPRESSION: 1. Interim readjustment of NG tube. NG tube remains coiled in the patient's hiatal hernia. 2. Previously identified small bowel distention is again noted and is again diminished from prior CT of 12/26/2019. Electronically Signed   By: Marcello Moores  Register   On: 12/26/2019 13:51   DG Abd Portable 1V-Small Bowel Protocol-Position Verification  Result Date: 12/26/2019 CLINICAL DATA:  NG tube placement. EXAM: PORTABLE ABDOMEN - 1 VIEW COMPARISON:  CT 12/26/2019. FINDINGS: NG  tube noted with tip coiled in a hiatal hernia. Decreased small-bowel distention from prior study. No free air. Degenerative changes scoliosis thoracic spine. Surgical clips left upper quadrant. IMPRESSION: NG tube noted with tip coiled in a hiatal hernia. Decreased small-bowel distention from prior study. Electronically Signed   By: Marcello Moores  Register   On: 12/26/2019 13:02   DG Abd Portable 1V-Small Bowel Obstruction Protocol-initial, 8 hr delay  Final Result    DG Abd 1 View  Final Result    DG Abd Portable 1V-Small Bowel Protocol-Position Verification  Final Result    DG Abd Portable 1V-Small Bowel Protocol-Position Verification  Final Result    DG Abd Portable 1V-Small Bowel Protocol-Position Verification  Final Result    CT Abdomen Pelvis Wo Contrast  Final Result       Scheduled Meds: . bisacodyl  10 mg Rectal Daily  . heparin  5,000 Units Subcutaneous Q8H  . lip balm  1 application Topical BID   PRN Meds: acetaminophen, albuterol, alum & mag hydroxide-simeth, guaiFENesin-dextromethorphan, hydrALAZINE, hydrocortisone, hydrocortisone cream, labetalol, magic mouthwash, menthol-cetylpyridinium, ondansetron (ZOFRAN) IV **OR** ondansetron (ZOFRAN) IV, oxyCODONE, phenol, prochlorperazine, simethicone Continuous Infusions: . sodium chloride 125 mL/hr at  12/28/19 0606  . ondansetron (ZOFRAN) IV        LOS: 2 days  Time spent: Greater than 50% of the 35 minute visit was spent in counseling/coordination of care for the patient as laid out in the A&P.   Dwyane Dee, MD [facility] 12/28/2019, 12:42 PM   Contact via secure chat.  To contact the attending provider between 7A-7P or the covering provider during after hours 7P-7A, please log into the web site [email or web address] and access using universal [place] password for that web site. If you do not have the password, please call the hospital operator.

## 2019-12-28 NOTE — Progress Notes (Signed)
Progress Note: General Surgery Service   Chief Complaint/Subjective: Some shoulder pain overnight now improved with medications, +flatus, no nausea or vomiting, tolerating ice chips  Objective: Vital signs in last 24 hours: Temp:  [97.8 F (36.6 C)-98.2 F (36.8 C)] 98.1 F (36.7 C) (08/21 0653) Pulse Rate:  [71-89] 82 (08/21 0653) Resp:  [12-27] 16 (08/21 0653) BP: (150-172)/(59-90) 152/59 (08/21 0653) SpO2:  [98 %-100 %] 98 % (08/21 0653) Last BM Date: 12/25/19  Intake/Output from previous day: 08/20 0701 - 08/21 0700 In: 1360 [I.V.:1010; IV Piggyback:350] Out: 920 [Urine:750; Emesis/NG output:150; Blood:20] Intake/Output this shift: No intake/output data recorded.  Gen: NAD  Resp: nonlabored  Card: RRR  Abd: soft, NT, ND, incisions c/d/i  Lab Results: CBC  Recent Labs    12/27/19 0530 12/28/19 0644  WBC 8.9 13.7*  HGB 12.6* 11.0*  HCT 39.1 34.2*  PLT 274 257   BMET Recent Labs    12/27/19 0530 12/28/19 0644  NA 136 140  K 4.0 4.4  CL 102 104  CO2 23 23  GLUCOSE 79 83  BUN 12 13  CREATININE 0.94 1.01  CALCIUM 8.8* 8.6*   PT/INR No results for input(s): LABPROT, INR in the last 72 hours. ABG No results for input(s): PHART, HCO3 in the last 72 hours.  Invalid input(s): PCO2, PO2  Anti-infectives: Anti-infectives (From admission, onward)   Start     Dose/Rate Route Frequency Ordered Stop   12/27/19 0915  cefoTEtan (CEFOTAN) 2 g in sodium chloride 0.9 % 100 mL IVPB        2 g 200 mL/hr over 30 Minutes Intravenous On call to O.R. 12/27/19 0727 12/27/19 1008      Medications: Scheduled Meds: . bisacodyl  10 mg Rectal Daily  . heparin  5,000 Units Subcutaneous Q8H  . lip balm  1 application Topical BID   Continuous Infusions: . sodium chloride 125 mL/hr at 12/28/19 0606  . lactated ringers    . methocarbamol (ROBAXIN) IV    . ondansetron (ZOFRAN) IV    . sodium chloride     PRN Meds:.acetaminophen, albuterol, alum & mag  hydroxide-simeth, fentaNYL (SUBLIMAZE) injection, guaiFENesin-dextromethorphan, hydrALAZINE, hydrocortisone, hydrocortisone cream, labetalol, lactated ringers, magic mouthwash, menthol-cetylpyridinium, methocarbamol (ROBAXIN) IV, ondansetron (ZOFRAN) IV **OR** ondansetron (ZOFRAN) IV, phenol, prochlorperazine, simethicone, sodium chloride  Assessment/Plan: s/p Procedure(s): LAPAROSCOPY DIAGNOSTI; LYSIS OF ADHESIONS 12/27/2019 -remove NG tube -clear liquids -ambulate with assist    LOS: 2 days   Oscar Skinner, MD [facility]

## 2019-12-29 LAB — CBC WITH DIFFERENTIAL/PLATELET
Abs Immature Granulocytes: 0.02 10*3/uL (ref 0.00–0.07)
Basophils Absolute: 0 10*3/uL (ref 0.0–0.1)
Basophils Relative: 0 %
Eosinophils Absolute: 0.1 10*3/uL (ref 0.0–0.5)
Eosinophils Relative: 2 %
HCT: 33.1 % — ABNORMAL LOW (ref 39.0–52.0)
Hemoglobin: 10.9 g/dL — ABNORMAL LOW (ref 13.0–17.0)
Immature Granulocytes: 0 %
Lymphocytes Relative: 18 %
Lymphs Abs: 1.6 10*3/uL (ref 0.7–4.0)
MCH: 32.4 pg (ref 26.0–34.0)
MCHC: 32.9 g/dL (ref 30.0–36.0)
MCV: 98.5 fL (ref 80.0–100.0)
Monocytes Absolute: 0.8 10*3/uL (ref 0.1–1.0)
Monocytes Relative: 10 %
Neutro Abs: 6.1 10*3/uL (ref 1.7–7.7)
Neutrophils Relative %: 70 %
Platelets: 241 10*3/uL (ref 150–400)
RBC: 3.36 MIL/uL — ABNORMAL LOW (ref 4.22–5.81)
RDW: 12 % (ref 11.5–15.5)
WBC: 8.7 10*3/uL (ref 4.0–10.5)
nRBC: 0 % (ref 0.0–0.2)

## 2019-12-29 LAB — BASIC METABOLIC PANEL
Anion gap: 6 (ref 5–15)
BUN: 7 mg/dL — ABNORMAL LOW (ref 8–23)
CO2: 25 mmol/L (ref 22–32)
Calcium: 8.3 mg/dL — ABNORMAL LOW (ref 8.9–10.3)
Chloride: 103 mmol/L (ref 98–111)
Creatinine, Ser: 0.82 mg/dL (ref 0.61–1.24)
GFR calc Af Amer: >60 mL/min (ref >60)
GFR calc non Af Amer: >60 mL/min (ref >60)
Glucose, Bld: 124 mg/dL — ABNORMAL HIGH (ref 70–99)
Potassium: 3.9 mmol/L (ref 3.5–5.1)
Sodium: 134 mmol/L — ABNORMAL LOW (ref 135–145)

## 2019-12-29 LAB — MAGNESIUM: Magnesium: 1.6 mg/dL — ABNORMAL LOW (ref 1.7–2.4)

## 2019-12-29 LAB — PHOSPHORUS: Phosphorus: 2.3 mg/dL — ABNORMAL LOW (ref 2.5–4.6)

## 2019-12-29 MED ORDER — ALUM & MAG HYDROXIDE-SIMETH 200-200-20 MG/5ML PO SUSP
30.0000 mL | ORAL | Status: DC | PRN
  Administered 2019-12-29: 30 mL via ORAL
  Filled 2019-12-29: qty 30

## 2019-12-29 MED ORDER — ENSURE ENLIVE PO LIQD
237.0000 mL | Freq: Two times a day (BID) | ORAL | Status: DC
  Administered 2019-12-29 (×2): 237 mL via ORAL

## 2019-12-29 MED ORDER — PANTOPRAZOLE SODIUM 40 MG PO TBEC
40.0000 mg | DELAYED_RELEASE_TABLET | Freq: Every day | ORAL | Status: DC
  Administered 2019-12-29 – 2019-12-30 (×2): 40 mg via ORAL
  Filled 2019-12-29 (×2): qty 1

## 2019-12-29 NOTE — Progress Notes (Signed)
PROGRESS NOTE    Oscar [name]   HUO:[id number]  DOB: November 17, 1932  DOA: 12/26/2019     3  PCP: Deland Pretty, MD  CC: abdominal pain  Hospital Course: Oscar [name] is an 86 yo male with PMH recurrent SBO's, hypertension, hyperlipidemia, gout, type 2 diabetes, BPH, GERD, history of esophageal cancer ( s/p surgical resection 2000, 2001) who presented to the ER with worsening abdominal pain for approximately 2 days. Given his history of recurrent SBO's, he underwent CT abdomen/pelvis.  This revealed a recurrent SBO with moderate to high-grade obstruction with a transition zone noted in the mid ileum.  He was evaluated by surgery and agreed for surgical intervention. He was taken to the OR on 12/27/2019 and underwent exploratory laparoscopy.  He was found to have a significant amount of adhesions which were lysed. On 12/28/2019, his NG tube was removed and his diet was advanced to clear liquids.   Interval History:  Underwent laparoscopic lysis of adhesions on 8/20 with surgery.  He endorses flatus and says he had a BM overnight. Tolerated soft foods for bfast this am. Pain minimal and otherwise feeling okay this am.   Old records reviewed in assessment of this patient  ROS: Constitutional: negative for chills and fevers, Respiratory: negative for cough, Cardiovascular: negative for chest pain and Gastrointestinal: negative for abdominal pain  Assessment & Plan: GERD - no PPI currently; if symptomatic will initiate PPI  History of esophageal cancer 2001 - s/p surgery   HTN (hypertension) - hold meds - use labetalol or hydralazine PRN  SBO (small bowel obstruction) - recurrent - multiple admissions for similar - taken for ex lap on 8/20, underwent laparoscopic lysis of adhesions -Surgery following, appreciate assistance -NGT removed on 12/28/2019.  Diet advanced to clear liquids on 8/21 and now soft diet on 8/22; if continues to tolerate, surgery anticipates d/c in next 24-48  hours -Continue encouraging ambulation and out of bed -Continue nausea and pain control  AKI (acute kidney injury) ([place]) - continue IVF - transient HyperK has resolved   Hyponatremia - monitor on IVF  Hyperkalemia - resolved with IVF   Antimicrobials: None  DVT prophylaxis: HSQ Code Status: Full Family Communication: None present Disposition Plan:  Status is: Inpatient  Remains inpatient appropriate because:Ongoing active pain requiring inpatient pain management, Unsafe d/c plan, IV treatments appropriate due to intensity of illness or inability to take PO and Inpatient level of care appropriate due to severity of illness   Dispo: The patient is from: Home              Anticipated d/c is to: Pending PT eval              Anticipated d/c date is: 2 days              Patient currently is not medically stable to d/c.   Objective: Blood pressure 137/67, pulse (!) 57, temperature 97.9 F (36.6 C), temperature source Oral, resp. rate 16, height 5\' 11"  (1.803 m), weight 81.2 kg, SpO2 94 %.  Examination: General appearance: Pleasant elderly man resting in bed in no distress.  NGT now out Head: Normocephalic, without obvious abnormality, atraumatic Eyes: EOMI Lungs: clear to auscultation bilaterally Heart: regular rate and rhythm and S1, S2 normal Abdomen: Soft, nondistended, appropriately tender to palpation.  BS more present, still soft. Surgical incisions x4 from laparoscopy noted on abdomen covered with dressing Extremities: No edema Skin: Skin color, texture, turgor normal. No rashes or lesions or normal  Neurologic: Grossly normal  Consultants:   General surgery  Procedures:   12/27/2019, lysis of adhesions, exploratory laparoscopy  Data Reviewed: I have personally reviewed following labs and imaging studies Results for orders placed or performed during the hospital encounter of 12/26/19 (from the past 24 hour(s))  CBC with Differential/Platelet     Status: Abnormal    Collection Time: 12/29/19  9:07 AM  Result Value Ref Range   WBC 8.7 4.0 - 10.5 K/uL   RBC 3.36 (L) 4.22 - 5.81 MIL/uL   Hemoglobin 10.9 (L) 13.0 - 17.0 g/dL   HCT 33.1 (L) 39 - 52 %   MCV 98.5 80.0 - 100.0 fL   MCH 32.4 26.0 - 34.0 pg   MCHC 32.9 30.0 - 36.0 g/dL   RDW 12.0 11.5 - 15.5 %   Platelets 241 150 - 400 K/uL   nRBC 0.0 0.0 - 0.2 %   Neutrophils Relative % 70 %   Neutro Abs 6.1 1.7 - 7.7 K/uL   Lymphocytes Relative 18 %   Lymphs Abs 1.6 0.7 - 4.0 K/uL   Monocytes Relative 10 %   Monocytes Absolute 0.8 0 - 1 K/uL   Eosinophils Relative 2 %   Eosinophils Absolute 0.1 0 - 0 K/uL   Basophils Relative 0 %   Basophils Absolute 0.0 0 - 0 K/uL   Immature Granulocytes 0 %   Abs Immature Granulocytes 0.02 0.00 - 0.07 K/uL  Phosphorus     Status: Abnormal   Collection Time: 12/29/19  9:07 AM  Result Value Ref Range   Phosphorus 2.3 (L) 2.5 - 4.6 mg/dL  Basic metabolic panel     Status: Abnormal   Collection Time: 12/29/19  9:07 AM  Result Value Ref Range   Sodium 134 (L) 135 - 145 mmol/L   Potassium 3.9 3.5 - 5.1 mmol/L   Chloride 103 98 - 111 mmol/L   CO2 25 22 - 32 mmol/L   Glucose, Bld 124 (H) 70 - 99 mg/dL   BUN 7 (L) 8 - 23 mg/dL   Creatinine, Ser 0.82 0.61 - 1.24 mg/dL   Calcium 8.3 (L) 8.9 - 10.3 mg/dL   GFR calc non Af Amer >60 >60 mL/min   GFR calc Af Amer >60 >60 mL/min   Anion gap 6 5 - 15  Magnesium     Status: Abnormal   Collection Time: 12/29/19  9:07 AM  Result Value Ref Range   Magnesium 1.6 (L) 1.7 - 2.4 mg/dL    Recent Results (from the past 240 hour(s))  SARS Coronavirus 2 by RT PCR (hospital order, performed in [place] hospital lab) Nasopharyngeal Nasopharyngeal Swab     Status: None   Collection Time: 12/26/19  9:56 AM   Specimen: Nasopharyngeal Swab  Result Value Ref Range Status   SARS Coronavirus 2 NEGATIVE NEGATIVE Final    Comment: (NOTE) SARS-CoV-2 target nucleic acids are NOT DETECTED.  The SARS-CoV-2 RNA is generally  detectable in upper and lower respiratory specimens during the acute phase of infection. The lowest concentration of SARS-CoV-2 viral copies this assay can detect is 250 copies / mL. A negative result does not preclude SARS-CoV-2 infection and should not be used as the sole basis for treatment or other patient management decisions.  A negative result may occur with improper specimen collection / handling, submission of specimen other than nasopharyngeal swab, presence of viral mutation(s) within the areas targeted by this assay, and inadequate number of viral copies (<250 copies / mL).  A negative result must be combined with clinical observations, patient history, and epidemiological information.  Fact Sheet for Patients:   StrictlyIdeas.no  Fact Sheet for Healthcare Providers: BankingDealers.co.za  This test is not yet approved or  cleared by the [place] FDA and has been authorized for detection and/or diagnosis of SARS-CoV-2 by FDA under an Emergency Use Authorization (EUA).  This EUA will remain in effect (meaning this test can be used) for the duration of the COVID-19 declaration under Section 564(b)(1) of the Act, 21 U.S.C. section 360bbb-3(b)(1), unless the authorization is terminated or revoked sooner.  Performed at [facility], [place] [address], [place], [place] [address]      Radiology Studies: No results found. DG Abd Portable 1V-Small Bowel Obstruction Protocol-initial, 8 hr delay  Final Result    DG Abd 1 View  Final Result    DG Abd Portable 1V-Small Bowel Protocol-Position Verification  Final Result    DG Abd Portable 1V-Small Bowel Protocol-Position Verification  Final Result    DG Abd Portable 1V-Small Bowel Protocol-Position Verification  Final Result    CT Abdomen Pelvis Wo Contrast  Final Result       Scheduled Meds: . bisacodyl  10 mg Rectal Daily  . feeding supplement  (ENSURE ENLIVE)  237 mL Oral BID BM  . heparin  5,000 Units Subcutaneous Q8H  . lip balm  1 application Topical BID  . pantoprazole  40 mg Oral Daily   PRN Meds: acetaminophen, albuterol, alum & mag hydroxide-simeth, guaiFENesin-dextromethorphan, hydrALAZINE, hydrocortisone, hydrocortisone cream, labetalol, magic mouthwash, menthol-cetylpyridinium, ondansetron (ZOFRAN) IV **OR** ondansetron (ZOFRAN) IV, oxyCODONE, phenol, prochlorperazine, simethicone Continuous Infusions: . sodium chloride 125 mL/hr at 12/29/19 0200  . ondansetron (ZOFRAN) IV        LOS: 3 days  Time spent: Greater than 50% of the 35 minute visit was spent in counseling/coordination of care for the patient as laid out in the A&P.   Dwyane Dee, MD [facility] 12/29/2019, 11:58 AM   Contact via secure chat.  To contact the attending provider between 7A-7P or the covering provider during after hours 7P-7A, please log into the web site [email or web address] and access using universal [place] password for that web site. If you do not have the password, please call the hospital operator.

## 2019-12-29 NOTE — Progress Notes (Signed)
Progress Note: General Surgery Service   Chief Complaint/Subjective: No pain, tolerating liquids well, BM lastnight  Objective: Vital signs in last 24 hours: Temp:  [97.9 F (36.6 C)-98.4 F (36.9 C)] 97.9 F (36.6 C) (08/22 0537) Pulse Rate:  [55-61] 57 (08/22 0537) Resp:  [14-16] 16 (08/22 0537) BP: (128-137)/(54-67) 137/67 (08/22 0537) SpO2:  [94 %-100 %] 94 % (08/22 0537) Last BM Date: 12/25/19  Intake/Output from previous day: 08/21 0701 - 08/22 0700 In: -  Out: 225 [Urine:225] Intake/Output this shift: No intake/output data recorded.  Gen: NAD  Resp: nonlabored  Card: bradycardic  Abd: incisions with bandages, nondistended, nontender  Lab Results: CBC  Recent Labs    12/27/19 0530 12/28/19 0644  WBC 8.9 13.7*  HGB 12.6* 11.0*  HCT 39.1 34.2*  PLT 274 257   BMET Recent Labs    12/27/19 0530 12/28/19 0644  NA 136 140  K 4.0 4.4  CL 102 104  CO2 23 23  GLUCOSE 79 83  BUN 12 13  CREATININE 0.94 1.01  CALCIUM 8.8* 8.6*   PT/INR No results for input(s): LABPROT, INR in the last 72 hours. ABG No results for input(s): PHART, HCO3 in the last 72 hours.  Invalid input(s): PCO2, PO2  Anti-infectives: Anti-infectives (From admission, onward)   Start     Dose/Rate Route Frequency Ordered Stop   12/27/19 0915  cefoTEtan (CEFOTAN) 2 g in sodium chloride 0.9 % 100 mL IVPB        2 g 200 mL/hr over 30 Minutes Intravenous On call to O.R. 12/27/19 0727 12/27/19 1008      Medications: Scheduled Meds: . bisacodyl  10 mg Rectal Daily  . feeding supplement (ENSURE ENLIVE)  237 mL Oral BID BM  . heparin  5,000 Units Subcutaneous Q8H  . lip balm  1 application Topical BID   Continuous Infusions: . sodium chloride 125 mL/hr at 12/28/19 1747  . ondansetron (ZOFRAN) IV     PRN Meds:.acetaminophen, albuterol, alum & mag hydroxide-simeth, guaiFENesin-dextromethorphan, hydrALAZINE, hydrocortisone, hydrocortisone cream, labetalol, magic mouthwash,  menthol-cetylpyridinium, ondansetron (ZOFRAN) IV **OR** ondansetron (ZOFRAN) IV, oxyCODONE, phenol, prochlorperazine, simethicone  Assessment/Plan: s/p Procedure(s): LAPAROSCOPY DIAGNOSTI; LYSIS OF ADHESIONS 12/27/2019 -advance diet -ambulate -if tolerates diet and ambulates well hopefully home in next 48 h    LOS: 3 days   Mickeal Skinner, MD [facility]

## 2019-12-30 LAB — CBC WITH DIFFERENTIAL/PLATELET
Abs Immature Granulocytes: 0.02 10*3/uL (ref 0.00–0.07)
Basophils Absolute: 0 10*3/uL (ref 0.0–0.1)
Basophils Relative: 0 %
Eosinophils Absolute: 0.2 10*3/uL (ref 0.0–0.5)
Eosinophils Relative: 2 %
HCT: 31.8 % — ABNORMAL LOW (ref 39.0–52.0)
Hemoglobin: 10.5 g/dL — ABNORMAL LOW (ref 13.0–17.0)
Immature Granulocytes: 0 %
Lymphocytes Relative: 20 %
Lymphs Abs: 1.8 10*3/uL (ref 0.7–4.0)
MCH: 32.3 pg (ref 26.0–34.0)
MCHC: 33 g/dL (ref 30.0–36.0)
MCV: 97.8 fL (ref 80.0–100.0)
Monocytes Absolute: 0.8 10*3/uL (ref 0.1–1.0)
Monocytes Relative: 8 %
Neutro Abs: 6.4 10*3/uL (ref 1.7–7.7)
Neutrophils Relative %: 70 %
Platelets: 236 10*3/uL (ref 150–400)
RBC: 3.25 MIL/uL — ABNORMAL LOW (ref 4.22–5.81)
RDW: 11.8 % (ref 11.5–15.5)
WBC: 9.3 10*3/uL (ref 4.0–10.5)
nRBC: 0 % (ref 0.0–0.2)

## 2019-12-30 LAB — BASIC METABOLIC PANEL
Anion gap: 8 (ref 5–15)
BUN: 5 mg/dL — ABNORMAL LOW (ref 8–23)
CO2: 25 mmol/L (ref 22–32)
Calcium: 8.5 mg/dL — ABNORMAL LOW (ref 8.9–10.3)
Chloride: 104 mmol/L (ref 98–111)
Creatinine, Ser: 0.83 mg/dL (ref 0.61–1.24)
GFR calc Af Amer: >60 mL/min (ref >60)
GFR calc non Af Amer: >60 mL/min (ref >60)
Glucose, Bld: 97 mg/dL (ref 70–99)
Potassium: 3.4 mmol/L — ABNORMAL LOW (ref 3.5–5.1)
Sodium: 137 mmol/L (ref 135–145)

## 2019-12-30 LAB — PHOSPHORUS: Phosphorus: 2.3 mg/dL — ABNORMAL LOW (ref 2.5–4.6)

## 2019-12-30 LAB — GLUCOSE, CAPILLARY: Glucose-Capillary: 95 mg/dL (ref 70–99)

## 2019-12-30 LAB — MAGNESIUM: Magnesium: 1.6 mg/dL — ABNORMAL LOW (ref 1.7–2.4)

## 2019-12-30 MED ORDER — GLYCERIN (LAXATIVE) 2.1 G RE SUPP
1.0000 | RECTAL | Status: DC | PRN
  Filled 2019-12-30: qty 1

## 2019-12-30 MED ORDER — LOSARTAN POTASSIUM-HCTZ 100-12.5 MG PO TABS: 0.5000 | ORAL_TABLET | Freq: Every day | ORAL | Status: DC

## 2019-12-30 MED ORDER — HYDROCHLOROTHIAZIDE 10 MG/ML ORAL SUSPENSION
6.2500 mg | Freq: Every day | ORAL | Status: DC
  Filled 2019-12-30: qty 1.25

## 2019-12-30 MED ORDER — MAGNESIUM OXIDE 400 (241.3 MG) MG PO TABS
800.0000 mg | ORAL_TABLET | Freq: Once | ORAL | Status: AC
  Administered 2019-12-30: 800 mg via ORAL
  Filled 2019-12-30: qty 2

## 2019-12-30 MED ORDER — LOSARTAN POTASSIUM 50 MG PO TABS
50.0000 mg | ORAL_TABLET | Freq: Every day | ORAL | Status: DC
  Administered 2019-12-30: 50 mg via ORAL
  Filled 2019-12-30: qty 1

## 2019-12-30 MED ORDER — POTASSIUM PHOSPHATES 15 MMOLE/5ML IV SOLN
30.0000 mmol | Freq: Once | INTRAVENOUS | Status: AC
  Administered 2019-12-30: 30 mmol via INTRAVENOUS
  Filled 2019-12-30: qty 10

## 2019-12-30 MED ORDER — OXYCODONE HCL 5 MG PO TABS: 5.0000 mg | ORAL_TABLET | Freq: Four times a day (QID) | ORAL | 0 refills | Status: DC | PRN

## 2019-12-30 NOTE — Discharge Summary (Signed)
Physician Discharge Summary  Oscar [name] JME:[id number] DOB: 1932/08/13 DOA: 12/26/2019  PCP: Deland Pretty, MD  Admit date: 12/26/2019 Discharge date: 12/30/2019  Admitted From: home Disposition:  home Discharging physician: Dwyane Dee, MD  Recommendations for Outpatient Follow-up:  1. Follow up with surgery  Patient discharged to home in Discharge Condition: stable CODE STATUS: Full Diet recommendation:  Diet Orders (From admission, onward)    Start     Ordered   12/29/19 0733  DIET SOFT Room service appropriate? Yes; Fluid consistency: Thin  Diet effective now       Question Answer Comment  Room service appropriate? Yes   Fluid consistency: Thin      12/29/19 [facility]          Hospital Course: Mr. Oscar [name] is an 86 yo male with PMH recurrent SBO's, hypertension, hyperlipidemia, gout, type 2 diabetes, BPH, GERD, history of esophageal cancer ( s/p surgical resection 2000, 2001) who presented to the ER with worsening abdominal pain for approximately 2 days. Given his history of recurrent SBO's, he underwent CT abdomen/pelvis.  This revealed a recurrent SBO with moderate to high-grade obstruction with a transition zone noted in the mid ileum.  He was evaluated by surgery and agreed for surgical intervention. He was taken to the OR on 12/27/2019 and underwent exploratory laparoscopy.  He was found to have a significant amount of adhesions which were lysed. On 12/28/2019, his NG tube was removed and his diet was advanced to clear liquids.  He tolerated further advancement of diet and had adequate return of flatus and bowel movements.  He was considered stable for discharging home with ongoing follow-up as needed with surgery.   GERD - no PPI currently; if symptomatic will initiate PPI  History of esophageal cancer 2001 - s/p surgery   HTN (hypertension) -Home meds resumed at discharge  SBO (small bowel obstruction) - recurrent - multiple admissions for similar - taken for ex  lap on 8/20, underwent laparoscopic lysis of adhesions -Surgery following, appreciate assistance -NGT removed on 12/28/2019.  Diet advanced to clear liquids on 8/21 and now soft diet on 8/22; he has had good return of bowel function and has flatus and repeat BM on 8/23 -Continue encouraging ambulation and out of bed -Continue nausea and pain control  AKI (acute kidney injury) ([place]) - continue IVF - transient HyperK has resolved   Hyponatremia - monitor on IVF  Hyperkalemia - resolved with IVF    The patient's chronic medical conditions were treated accordingly per the patient's home medication regimen except as noted.  On day of discharge, patient was felt deemed stable for discharge. Patient/family member advised to call PCP or come back to ER if needed.   Discharge Diagnoses:   Principal Diagnosis: SBO (small bowel obstruction) [facility])  Active Hospital Problems   Diagnosis Date Noted  . SBO (small bowel obstruction) - recurrent 10/31/2016    Priority: High  . Hyponatremia 12/27/2019  . Hyperkalemia 12/26/2019  . History of esophagectomy 12/26/2019  . BPH (benign prostatic hyperplasia) 10/19/2018  . Tobacco abuse 10/19/2018  . AKI (acute kidney injury) ([place])   . Malnutrition of moderate degree 02/15/2017  . OSA (obstructive sleep apnea) 02/12/2017  . Dehydration 02/12/2017  . HTN (hypertension) 07/09/2013  . Aortic insufficiency 07/09/2013  . GERD 07/13/2009  . History of esophageal cancer 2001 07/18/1999    Resolved Hospital Problems  No resolved problems to display.    Discharge Instructions    Discharge wound care:   Complete by:  As directed    Okay to remove any further dressings in place at discharge. Clean surgical sites gently with soap and water daily   Increase activity slowly   Complete by: As directed      Allergies as of 12/30/2019      Reactions   Bee Venom Anaphylaxis   Flomax [tamsulosin] Other (See Comments)   "Makes me feel weird"       Medication List    TAKE these medications   atenolol 25 MG tablet Commonly known as: TENORMIN Take 25 mg by mouth daily.   losartan-hydrochlorothiazide 100-12.5 MG tablet Commonly known as: HYZAAR Take 0.5 tablets by mouth daily.   omeprazole 20 MG capsule Commonly known as: PRILOSEC Take 40 mg by mouth 2 (two) times daily before a meal.   oxyCODONE 5 MG immediate release tablet Commonly known as: Oxy IR/ROXICODONE Take 1 tablet (5 mg total) by mouth every 6 (six) hours as needed for severe pain or breakthrough pain.   polyethylene glycol powder 17 GM/SCOOP powder Commonly known as: GLYCOLAX/MIRALAX Take 34-68 g by mouth daily.   Prostate Health Caps Take 1 capsule by mouth daily.   vitamin C 1000 MG tablet Take 1,000 mg by mouth daily.            Discharge Care Instructions  (From admission, onward)         Start     Ordered   12/30/19 0000  Discharge wound care:       Comments: Okay to remove any further dressings in place at discharge. Clean surgical sites gently with soap and water daily   12/30/19 1256          Follow-up Information    Michael Boston, MD Follow up on 01/20/2020.   Specialty: General Surgery Why: Your appointment is at 8:30AM.  Be at the office 30 minutes early for check in and bring photo ID.   Contact information: [address] [place] [address] [phone]              Allergies  Allergen Reactions  . Bee Venom Anaphylaxis  . Flomax [Tamsulosin] Other (See Comments)    "Makes me feel weird"    Consultations: General surgery  Discharge Exam: BP 139/61   Pulse 62   Temp 98.1 F (36.7 C) (Oral)   Resp 15   Ht 5\' 11"  (1.803 m)   Wt 81.2 kg   SpO2 97%   BMI 24.97 kg/m  General appearance: Pleasant elderly man resting in bed in no distress. Head: Normocephalic, without obvious abnormality, atraumatic Eyes: EOMI Lungs: clear to auscultation bilaterally Heart: regular rate and rhythm and S1, S2  normal Abdomen: Soft, nondistended, minimal TTP, BS present Extremities: No edema Skin: Skin color, texture, turgor normal. No rashes or lesions or normal Neurologic: Grossly normal  The results of significant diagnostics from this hospitalization (including imaging, microbiology, ancillary and laboratory) are listed below for reference.   Microbiology: Recent Results (from the past 240 hour(s))  SARS Coronavirus 2 by RT PCR (hospital order, performed in [facility] hospital lab) Nasopharyngeal Nasopharyngeal Swab     Status: None   Collection Time: 12/26/19  9:56 AM   Specimen: Nasopharyngeal Swab  Result Value Ref Range Status   SARS Coronavirus 2 NEGATIVE NEGATIVE Final    Comment: (NOTE) SARS-CoV-2 target nucleic acids are NOT DETECTED.  The SARS-CoV-2 RNA is generally detectable in upper and lower respiratory specimens during the acute phase of infection. The lowest  concentration of SARS-CoV-2 viral copies this assay can detect is 250 copies / mL. A negative result does not preclude SARS-CoV-2 infection and should not be used as the sole basis for treatment or other patient management decisions.  A negative result may occur with improper specimen collection / handling, submission of specimen other than nasopharyngeal swab, presence of viral mutation(s) within the areas targeted by this assay, and inadequate number of viral copies (<250 copies / mL). A negative result must be combined with clinical observations, patient history, and epidemiological information.  Fact Sheet for Patients:   StrictlyIdeas.no  Fact Sheet for Healthcare Providers: BankingDealers.co.za  This test is not yet approved or  cleared by the [place] FDA and has been authorized for detection and/or diagnosis of SARS-CoV-2 by FDA under an Emergency Use Authorization (EUA).  This EUA will remain in effect (meaning this test can be used) for the duration of  the COVID-19 declaration under Section 564(b)(1) of the Act, 21 U.S.C. section 360bbb-3(b)(1), unless the authorization is terminated or revoked sooner.  Performed at [facility], [place] [address], [place], [place] [address]      Labs: BNP (last 3 results) No results for input(s): BNP in the last 8760 hours. Basic Metabolic Panel: Recent Labs  Lab 12/26/19 1725 12/27/19 0530 12/28/19 0644 12/29/19 0907 12/30/19 0547  NA 135 136 140 134* 137  K 4.0 4.0 4.4 3.9 3.4*  CL 99 102 104 103 104  CO2 28 23 23 25 25   GLUCOSE 103* 79 83 124* 97  BUN 13 12 13  7* 5*  CREATININE 0.99 0.94 1.01 0.82 0.83  CALCIUM 9.0 8.8* 8.6* 8.3* 8.5*  MG  --   --  1.7 1.6* 1.6*  PHOS 3.4  --  3.5 2.3* 2.3*   Liver Function Tests: Recent Labs  Lab 12/26/19 1005  AST 38  ALT 10  ALKPHOS 70  BILITOT 1.7*  PROT 8.1  ALBUMIN 4.7   Recent Labs  Lab 12/26/19 1005  LIPASE 23   No results for input(s): AMMONIA in the last 168 hours. CBC: Recent Labs  Lab 12/26/19 1005 12/27/19 0530 12/28/19 0644 12/29/19 0907 12/30/19 0547  WBC 15.9* 8.9 13.7* 8.7 9.3  NEUTROABS 13.7*  --  10.9* 6.1 6.4  HGB 14.4 12.6* 11.0* 10.9* 10.5*  HCT 43.2 39.1 34.2* 33.1* 31.8*  MCV 95.8 99.5 98.6 98.5 97.8  PLT 340 274 257 241 236   Cardiac Enzymes: No results for input(s): CKTOTAL, CKMB, CKMBINDEX, TROPONINI in the last 168 hours. BNP: Invalid input(s): POCBNP CBG: Recent Labs  Lab 12/30/19 0303  GLUCAP 95   D-Dimer No results for input(s): DDIMER in the last 72 hours. Hgb A1c No results for input(s): HGBA1C in the last 72 hours. Lipid Profile No results for input(s): CHOL, HDL, LDLCALC, TRIG, CHOLHDL, LDLDIRECT in the last 72 hours. Thyroid function studies No results for input(s): TSH, T4TOTAL, T3FREE, THYROIDAB in the last 72 hours.  Invalid input(s): FREET3 Anemia work up No results for input(s): VITAMINB12, FOLATE, FERRITIN, TIBC, IRON, RETICCTPCT in the last 72  hours. Urinalysis    Component Value Date/Time   COLORURINE YELLOW 05/04/2019 2047   APPEARANCEUR CLEAR 05/04/2019 2047   LABSPEC 1.045 (H) 05/04/2019 2047   PHURINE 7.0 05/04/2019 2047   GLUCOSEU NEGATIVE 05/04/2019 2047   HGBUR LARGE (A) 05/04/2019 2047   BILIRUBINUR NEGATIVE 05/04/2019 2047   KETONESUR 20 (A) 05/04/2019 2047   PROTEINUR 100 (A) 05/04/2019 2047   UROBILINOGEN 0.2 09/12/2014 1816  NITRITE NEGATIVE 05/04/2019 2047   LEUKOCYTESUR NEGATIVE 05/04/2019 2047   Sepsis Labs Invalid input(s): PROCALCITONIN,  WBC,  LACTICIDVEN Microbiology Recent Results (from the past 240 hour(s))  SARS Coronavirus 2 by RT PCR (hospital order, performed in [facility] hospital lab) Nasopharyngeal Nasopharyngeal Swab     Status: None   Collection Time: 12/26/19  9:56 AM   Specimen: Nasopharyngeal Swab  Result Value Ref Range Status   SARS Coronavirus 2 NEGATIVE NEGATIVE Final    Comment: (NOTE) SARS-CoV-2 target nucleic acids are NOT DETECTED.  The SARS-CoV-2 RNA is generally detectable in upper and lower respiratory specimens during the acute phase of infection. The lowest concentration of SARS-CoV-2 viral copies this assay can detect is 250 copies / mL. A negative result does not preclude SARS-CoV-2 infection and should not be used as the sole basis for treatment or other patient management decisions.  A negative result may occur with improper specimen collection / handling, submission of specimen other than nasopharyngeal swab, presence of viral mutation(s) within the areas targeted by this assay, and inadequate number of viral copies (<250 copies / mL). A negative result must be combined with clinical observations, patient history, and epidemiological information.  Fact Sheet for Patients:   StrictlyIdeas.no  Fact Sheet for Healthcare Providers: BankingDealers.co.za  This test is not yet approved or  cleared by the [place]  FDA and has been authorized for detection and/or diagnosis of SARS-CoV-2 by FDA under an Emergency Use Authorization (EUA).  This EUA will remain in effect (meaning this test can be used) for the duration of the COVID-19 declaration under Section 564(b)(1) of the Act, 21 U.S.C. section 360bbb-3(b)(1), unless the authorization is terminated or revoked sooner.  Performed at [facility], [place] [address] [place], [place] [address]     Procedures/Studies: CT Abdomen Pelvis Wo Contrast  Result Date: 12/26/2019 CLINICAL DATA:  Abdominal pain and abdominal distension. EXAM: CT ABDOMEN AND PELVIS WITHOUT CONTRAST TECHNIQUE: Multidetector CT imaging of the abdomen and pelvis was performed following the standard protocol without IV contrast. COMPARISON:  CT 05/04/2019 FINDINGS: Lower chest: Previous gastric pull-through. Lung bases show mild chronic scarring but are otherwise clear. Hepatobiliary: No liver parenchymal lesion. Multiple gallstones dependent in the gallbladder as seen previously. Pancreas: Normal Spleen: Normal Adrenals/Urinary Tract: Adrenal glands are normal. Left upper pole renal cyst as seen previously. Medial left renal cyst as seen previously. No hydronephrosis. Bladder is normal. Stomach/Bowel: Small-bowel obstruction with dilated fluid and air-filled small intestine. Transition zone probably in the mid ileum. Appendix is normal. No colon pathology. Vascular/Lymphatic: Aortic atherosclerosis. No aneurysm. IVC is normal. No retroperitoneal adenopathy. Reproductive: Enlarged prostate. Other: No free fluid or air. Musculoskeletal: Ordinary degenerative changes with spinal stenosis at L4-5. IMPRESSION: Recurrent small-bowel obstruction, moderate to high-grade. Transition zone in the mid ileum. Cholelithiasis. Aortic Atherosclerosis (ICD10-I70.0). Enlarged prostate. Electronically Signed   By: Nelson Chimes M.D.   On: 12/26/2019 10:47   DG Abd 1 View  Result Date:  12/26/2019 CLINICAL DATA:  NG tube placement. EXAM: ABDOMEN - 1 VIEW COMPARISON:  Prior same day abdominal radiographs. FINDINGS: Non weighted enteric tube tip and side hole demonstrate midline positioning above the diaphragm. This likely reflects sequela of prior esophagectomy with gastric pull-through, better demonstrated on recent CT. Surgical clips overlie the upper abdomen and cardiomediastinal silhouette. Clear lung bases. Gas within nondilated bowel loops. IMPRESSION: Taking into account patient history of esophagectomy and gastric pull-through, enteric tube likely overlies the gastric body. Electronically Signed  By: Primitivo Gauze M.D.   On: 12/26/2019 17:11   DG Abd Portable 1V-Small Bowel Obstruction Protocol-initial, 8 hr delay  Result Date: 12/27/2019 CLINICAL DATA:  Small-bowel protocol 8 hours postcontrast EXAM: PORTABLE ABDOMEN - 1 VIEW COMPARISON:  None. FINDINGS: There is no enteric contrast within the visible small bowel or colon. No dilated small bowel. IMPRESSION: No enteric contrast within the visible small bowel or colon. Electronically Signed   By: Ulyses Jarred M.D.   On: 12/27/2019 02:31   DG Abd Portable 1V-Small Bowel Protocol-Position Verification  Result Date: 12/26/2019 CLINICAL DATA:  Enteric catheter placement, esophageal cancer status post esophagectomy EXAM: PORTABLE ABDOMEN - 1 VIEW COMPARISON:  12/26/2019 FINDINGS: Single frontal view of the lower chest and upper abdomen demonstrates enteric catheter coiled over the distal aspect of the stomach in a patient with a history of esophagectomy and gastric pull-through procedure. The tip of the catheter projects at the level of the diaphragmatic hiatus. Cardiac silhouette is unremarkable. No airspace disease, effusion, or pneumothorax. The dilated fluid-filled loops of bowel seen on earlier CT are less well visualized on x-ray. IMPRESSION: 1. Enteric catheter coiled over the lower chest, likely within the gastric lumen in  this patient with history of esophagectomy and gastric pull-through procedure. Electronically Signed   By: Randa Ngo M.D.   On: 12/26/2019 15:01   DG Abd Portable 1V-Small Bowel Protocol-Position Verification  Result Date: 12/26/2019 CLINICAL DATA:  History of intestinal obstruction. Abdominal pain. History of esophageal cancer. EXAM: PORTABLE ABDOMEN - 1 VIEW COMPARISON:  12/26/2019.  CT 12/26/2019. FINDINGS: Interim readjustment of NG tube. NG tube again noted coiled in hiatal hernia. Previously identified small bowel distention is again noted and is again diminished from prior CT of 12/26/2019. Surgical clips upper abdomen. IMPRESSION: 1. Interim readjustment of NG tube. NG tube remains coiled in the patient's hiatal hernia. 2. Previously identified small bowel distention is again noted and is again diminished from prior CT of 12/26/2019. Electronically Signed   By: Marcello Moores  Register   On: 12/26/2019 13:51   DG Abd Portable 1V-Small Bowel Protocol-Position Verification  Result Date: 12/26/2019 CLINICAL DATA:  NG tube placement. EXAM: PORTABLE ABDOMEN - 1 VIEW COMPARISON:  CT 12/26/2019. FINDINGS: NG tube noted with tip coiled in a hiatal hernia. Decreased small-bowel distention from prior study. No free air. Degenerative changes scoliosis thoracic spine. Surgical clips left upper quadrant. IMPRESSION: NG tube noted with tip coiled in a hiatal hernia. Decreased small-bowel distention from prior study. Electronically Signed   By: Marcello Moores  Register   On: 12/26/2019 13:02     Time coordinating discharge: Over 23 minutes    Dwyane Dee, MD  [facility] 12/30/2019, 12:56 PM Pager: Secure chat  If 7PM-7AM, please contact night-coverage [email or web address] Password [email or web address]

## 2019-12-30 NOTE — Progress Notes (Signed)
3 Days Post-Op    VW:[id number] pain  Subjective: Port sites all look good, he had 2 Bm's Saturday, none yesterday, but lots of gas, Grits,and boiled egg for breakfast without issue.   Asking if he can go home.    Objective: Vital signs in last 24 hours: Temp:  [97.9 F (36.6 C)-98.7 F (37.1 C)] 98.1 F (36.7 C) (08/23 0521) Pulse Rate:  [54-64] 64 (08/23 0814) Resp:  [12-19] 15 (08/23 0814) BP: (150-180)/(60-72) 151/60 (08/23 0814) SpO2:  [94 %-98 %] 97 % (08/23 0521) Last BM Date: 12/28/19 240 PO recorded 1749 IV 2450 urine NO Bm recorded Afebrile, VSS K+ 3.4, mag 1.6 WBC 9.3 H/H 10.5/31.8   Intake/Output from previous day: 08/22 0701 - 08/23 0700 In: 1989.1 [P.O.:240; I.V.:1749.1] Out: 2450 [Urine:2450] Intake/Output this shift: Total I/O In: -  Out: 300 [Urine:300]  General appearance: alert, cooperative and no distress GI: soft nontender, Positive BS, and flatus, BM 2 days ago, none yesterday, but lots of flatus.  Lab Results:  Recent Labs    12/29/19 0907 12/30/19 0547  WBC 8.7 9.3  HGB 10.9* 10.5*  HCT 33.1* 31.8*  PLT 241 236    BMET Recent Labs    12/29/19 0907 12/30/19 0547  NA 134* 137  K 3.9 3.4*  CL 103 104  CO2 25 25  GLUCOSE 124* 97  BUN 7* 5*  CREATININE 0.82 0.83  CALCIUM 8.3* 8.5*   PT/INR No results for input(s): LABPROT, INR in the last 72 hours.  Recent Labs  Lab 12/26/19 1005  AST 38  ALT 10  ALKPHOS 70  BILITOT 1.7*  PROT 8.1  ALBUMIN 4.7     Lipase     Component Value Date/Time   LIPASE 23 12/26/2019 1005     Medications: . bisacodyl  10 mg Rectal Daily  . feeding supplement (ENSURE ENLIVE)  237 mL Oral BID BM  . heparin  5,000 Units Subcutaneous Q8H  . losartan  50 mg Oral Daily   And  . hydrochlorothiazide  6.25 mg Oral Daily  . lip balm  1 application Topical BID  . magnesium oxide  800 mg Oral Once  . pantoprazole  40 mg Oral Daily   . sodium chloride 75 mL/hr at 12/29/19 1900  . ondansetron  (ZOFRAN) IV    . potassium PHOSPHATE IVPB (in mmol)      Assessment/Plan GERD Hx of esophageal cancer Hypertension AKI Hyponatremia - resolved Hypokalemia - K+ 3.4  RECURRENT SMALL BOWEL OBSTRUCTION  - 7th admit for SBO/transition in the mid ileum LAPAROSCOPY DIAGNOSTIC, LYSIS OF ADHESIONS, REMOVAL OF SKIN ACROCHORDON8/20/21, Dr. Michael Boston POD#3  FEN:  IV fluids/soft diet ID:  Cefotetan pre op DVT:  Heparin Follow up:  Dr. Johney Maine   Plan:  From our standpoint he can go home when stable from medical standpoint.  I would be sure he has at least one more BM before going.  I will be sure he has follow up with Dr. Johney Maine in the [facility].    LOS: 4 days    Birdella Sippel 12/30/2019 Please see Amion

## 2019-12-30 NOTE — Discharge Instructions (Signed)
LAPAROSCOPIC SURGERY: POST OP INSTRUCTIONS  ######################################################################  EAT Gradually transition to a high fiber diet with a fiber supplement over the next few weeks after discharge.  Start with a pureed / full liquid diet (see below)  WALK Walk an hour a day.  Control your pain to do that.    CONTROL PAIN Control pain so that you can walk, sleep, tolerate sneezing/coughing, go up/down stairs.  HAVE A BOWEL MOVEMENT DAILY Keep your bowels regular to avoid problems.  OK to try a laxative to override constipation.  OK to use an antidairrheal to slow down diarrhea.  Call if not better after 2 tries  CALL IF YOU HAVE PROBLEMS/CONCERNS Call if you are still struggling despite following these instructions. Call if you have concerns not answered by these instructions  ######################################################################    1. DIET: Follow a light bland diet & liquids the first 24 hours after arrival home, such as soup, liquids, starches, etc.  Be sure to drink plenty of fluids.  Quickly advance to a usual solid diet within a few days.  Avoid fast food or heavy meals as your are more likely to get nauseated or have irregular bowels.  A low-fat, high-fiber diet for the rest of your life is ideal.  2. Take your usually prescribed home medications unless otherwise directed.  3. PAIN CONTROL: a. Pain is best controlled by a usual combination of three different methods TOGETHER: i. Ice/Heat ii. Over the counter pain medication iii. Prescription pain medication b. Most patients will experience some swelling and bruising around the incisions.  Ice packs or heating pads (30-60 minutes up to 6 times a day) will help. Use ice for the first few days to help decrease swelling and bruising, then switch to heat to help relax tight/sore spots and speed recovery.  Some people prefer to use ice alone, heat alone, alternating between ice & heat.   Experiment to what works for you.  Swelling and bruising can take several weeks to resolve.   c. It is helpful to take an over-the-counter pain medication regularly for the first few weeks.  Choose one of the following that works best for you: i. Naproxen (Aleve, etc)  Two 220mg tabs twice a day ii. Ibuprofen (Advil, etc) Three 200mg tabs four times a day (every meal & bedtime) iii. Acetaminophen (Tylenol, etc) 500-650mg four times a day (every meal & bedtime) d. A  prescription for pain medication (such as oxycodone, hydrocodone, tramadol, gabapentin, methocarbamol, etc) should be given to you upon discharge.  Take your pain medication as prescribed.  i. If you are having problems/concerns with the prescription medicine (does not control pain, nausea, vomiting, rash, itching, etc), please call us [phone] to see if we need to switch you to a different pain medicine that will work better for you and/or control your side effect better. ii. If you need a refill on your pain medication, please give us 48 hour notice.  contact your pharmacy.  They will contact our office to request authorization. Prescriptions will not be filled after 5 pm or on week-ends  4. Avoid getting constipated.   a. Between the surgery and the pain medications, it is common to experience some constipation.   b. Increasing fluid intake and taking a fiber supplement (such as Metamucil, Citrucel, FiberCon, MiraLax, etc) 1-2 times a day regularly will usually help prevent this problem from occurring.   c. A mild laxative (prune juice, Milk of Magnesia, MiraLax, etc) should be taken according to   package directions if there are no bowel movements after 48 hours.   5. Watch out for diarrhea.   a. If you have many loose bowel movements, simplify your diet to bland foods & liquids for a few days.   b. Stop any stool softeners and decrease your fiber supplement.   c. Switching to mild anti-diarrheal medications (Kayopectate, Pepto  Bismol) can help.   d. If this worsens or does not improve, please call us.  6. Wash / shower every day.  You may shower over the dressings as they are waterproof.  Continue to shower over incision(s) after the dressing is off.  7. Remove your waterproof bandages 5 days after surgery.  You may leave the incision open to air.  You may replace a dressing/Band-Aid to cover the incision for comfort if you wish.   8. ACTIVITIES as tolerated:   a. You may resume regular (light) daily activities beginning the next day--such as daily self-care, walking, climbing stairs--gradually increasing activities as tolerated.  If you can walk 30 minutes without difficulty, it is safe to try more intense activity such as jogging, treadmill, bicycling, low-impact aerobics, swimming, etc. b. Save the most intensive and strenuous activity for last such as sit-ups, heavy lifting, contact sports, etc  Refrain from any heavy lifting or straining until you are off narcotics for pain control.   c. DO NOT PUSH THROUGH PAIN.  Let pain be your guide: If it hurts to do something, don't do it.  Pain is your body warning you to avoid that activity for another week until the pain goes down. d. You may drive when you are no longer taking prescription pain medication, you can comfortably wear a seatbelt, and you can safely maneuver your car and apply brakes. e. You may have sexual intercourse when it is comfortable.  9. FOLLOW UP in our office a. Please call [facility] at [phone] to set up an appointment to see your surgeon in the office for a follow-up appointment approximately 2-3 weeks after your surgery. b. Make sure that you call for this appointment the day you arrive home to insure a convenient appointment time.  10. IF YOU HAVE DISABILITY OR FAMILY LEAVE FORMS, BRING THEM TO THE OFFICE FOR PROCESSING.  DO NOT GIVE THEM TO YOUR DOCTOR.   WHEN TO CALL US [phone]: 1. Poor pain control 2. Reactions / problems with new  medications (rash/itching, nausea, etc)  3. Fever over 101.5 F (38.5 C) 4. Inability to urinate 5. Nausea and/or vomiting 6. Worsening swelling or bruising 7. Continued bleeding from incision. 8. Increased pain, redness, or drainage from the incision   The clinic staff is available to answer your questions during regular business hours (8:30am-5pm).  Please don't hesitate to call and ask to speak to one of our nurses for clinical concerns.   If you have a medical emergency, go to the nearest emergency room or call 911.  A surgeon from [facility] is always on call at the hospitals   [facility] [address], [address], [place], [place]  [address] ? MAIN: [phone] ? TOLL FREE: [phone] ?  FAX [phone] [email or web address]   

## 2020-01-06 DIAGNOSIS — M5136 Other intervertebral disc degeneration, lumbar region: Secondary | ICD-10-CM | POA: Diagnosis not present

## 2020-01-06 DIAGNOSIS — M9905 Segmental and somatic dysfunction of pelvic region: Secondary | ICD-10-CM | POA: Diagnosis not present

## 2020-01-06 DIAGNOSIS — M9903 Segmental and somatic dysfunction of lumbar region: Secondary | ICD-10-CM | POA: Diagnosis not present

## 2020-01-06 DIAGNOSIS — M9904 Segmental and somatic dysfunction of sacral region: Secondary | ICD-10-CM | POA: Diagnosis not present

## 2020-01-08 DIAGNOSIS — M5136 Other intervertebral disc degeneration, lumbar region: Secondary | ICD-10-CM | POA: Diagnosis not present

## 2020-01-08 DIAGNOSIS — M9904 Segmental and somatic dysfunction of sacral region: Secondary | ICD-10-CM | POA: Diagnosis not present

## 2020-01-08 DIAGNOSIS — M9905 Segmental and somatic dysfunction of pelvic region: Secondary | ICD-10-CM | POA: Diagnosis not present

## 2020-01-08 DIAGNOSIS — M9903 Segmental and somatic dysfunction of lumbar region: Secondary | ICD-10-CM | POA: Diagnosis not present

## 2020-01-14 ENCOUNTER — Other Ambulatory Visit: Payer: Self-pay

## 2020-01-14 ENCOUNTER — Ambulatory Visit (INDEPENDENT_AMBULATORY_CARE_PROVIDER_SITE_OTHER): Payer: Medicare Other | Admitting: Family Medicine

## 2020-01-14 ENCOUNTER — Encounter: Payer: Self-pay | Admitting: Family Medicine

## 2020-01-14 DIAGNOSIS — G8929 Other chronic pain: Secondary | ICD-10-CM

## 2020-01-14 DIAGNOSIS — M48062 Spinal stenosis, lumbar region with neurogenic claudication: Secondary | ICD-10-CM | POA: Diagnosis not present

## 2020-01-14 DIAGNOSIS — M545 Low back pain, unspecified: Secondary | ICD-10-CM

## 2020-01-14 NOTE — Progress Notes (Signed)
Office Visit Note   Patient: Oscar [name]           Date of Birth: February 13, 1933           MRN: [id number] Visit Date: 01/14/2020 Requested by: Deland Pretty, MD [address] [place],  [place] [address] PCP: Deland Pretty, MD  Subjective: Chief Complaint  Patient presents with   Left Hip - Pain    Posterior hip pain x 2 months. Hurts into the buttock area. Feeling "tired" across the lower back. Ambulating with cane.    HPI: He is here with left posterior hip pain.  Symptoms started about 2 months ago, no injury.  Gradual onset of pain with occasional radiation into his leg.  Sometimes he feels like his leg is dragging.  No numbness or tingling, no bowel or bladder dysfunction.  No significant groin pain.  He has a history of severe multifactorial spinal stenosis at L4-5.  Over the years he has done well with epidural injections, he has not had one in probably 8 years.               ROS:   All other systems were reviewed and are negative.  Objective: Vital Signs: There were no vitals taken for this visit.  Physical Exam:  General:  Alert and oriented, in no acute distress. Pulm:  Breathing unlabored. Psy:  Normal mood, congruent affect.  Low back: No significant midline tenderness.  He does have some pain in the left sciatic notch.  No pain with internal/external hip rotation and his range of motion is good.  Lower extremity strength and reflexes are normal except for ankle dorsiflexion and eversion on the left which are slightly weak at 4+/5.  Knee and ankle DTRs are 2+.  Imaging: No results found.  Assessment & Plan: 1.  Left posterior hip and leg pain with ankle dorsiflexion and eversion weakness, suspect due to L4-5 spinal stenosis. -We will give him intramuscular steroid injection today.  Also referral for lumbar epidural injection.  Return as needed.     Procedures: No procedures performed  No notes on file     PMFS History: Patient Active Problem  List   Diagnosis Date Noted   Hyponatremia 12/27/2019   Hyperkalemia 12/26/2019   ARF (acute renal failure) ([facility]) 12/26/2019   History of esophagectomy 12/26/2019   Tobacco abuse 10/19/2018   BPH (benign prostatic hyperplasia) 10/19/2018   AKI (acute kidney injury) ([facility])    Acute on chronic respiratory failure with hypoxia ([facility]) 11/15/2017   Malnutrition of moderate degree 02/15/2017   Leukocytosis 02/12/2017   OSA (obstructive sleep apnea) 02/12/2017   Dehydration 02/12/2017   SBO (small bowel obstruction) - recurrent 10/31/2016   Aortic insufficiency 07/09/2013   HTN (hypertension) 07/09/2013   GERD 07/13/2009   DYSPHAGIA 07/13/2009   FLATULENCE-GAS-BLOATING 07/13/2009   History of esophageal cancer 2001 07/18/1999   Past Medical History:  Diagnosis Date   Acid reflux    Barrett's esophagus    BPH (benign prostatic hyperplasia)    Colon polyps    DDD (degenerative disc disease)    Diabetes mellitus    controlled with diet and exercise   ED (erectile dysfunction)    Esophageal cancer ([facility]) 2001   tx'd surgery - abdominal & right posterior chest approach (2001) - [place], [place]   First degree AV block    Gout    Hemorrhoids    History of palpitations    Hyperlipidemia    Hypertension  Lipoma    Obesity    Osteoarthritis    Small bowel obstruction ([facility])    Tinea versicolor    Tinnitus     Family History  Problem Relation Age of Onset   Diabetes Mother    CAD Mother    Stroke Sister    Colon cancer Neg Hx    Esophageal cancer Neg Hx    Rectal cancer Neg Hx    Stomach cancer Neg Hx     Past Surgical History:  Procedure Laterality Date   bilateral olecranon bursal excisions     ESOPHAGECTOMY  2001   [place] - [facility]/Abd approach.  High Grade Dysplasia w esophageal CA   HEMORRHOID SURGERY     LAPAROSCOPY N/A 12/27/2019   Procedure: LAPAROSCOPY DIAGNOSTI; LYSIS OF ADHESIONS;  Surgeon:  Boston, MD;  Location: [facility];  Service: General;  Laterality: N/A;   NM MYOCAR PERF WALL MOTION  2011   dipyridamole myoview - stress images show medium in size, moderate in intensity perfusion defect in basal inferior & mid inferior walls with mild defect reversibility at rest, EF 64%   TRANSTHORACIC ECHOCARDIOGRAM  2011   EF=>55%, mild mitral annular calcif, mild calcif of MV apparatus, mild TR, normal RSVP, mild AV regurg, aortic root sclerosis/calcifiication   VASECTOMY     Social History   Occupational History   Occupation: self employed  Tobacco Use   Smoking status: Current Some Day Smoker    Years: 50.00    Types: Cigars, Pipe   Smokeless tobacco: Never Used   Tobacco comment: 2-3x/daily, sometimes not at all , tobacco info given 08/01/13  Vaping Use   Vaping Use: Never used  Substance and Sexual Activity   Alcohol use: Yes    Alcohol/week: 7.0 - 14.0 standard drinks    Types: 7 - 14 Standard drinks or equivalent per week   Drug use: No   Sexual activity: Not on file

## 2020-01-16 ENCOUNTER — Telehealth: Payer: Self-pay | Admitting: Family Medicine

## 2020-01-16 NOTE — Telephone Encounter (Signed)
Patient called.   He is requesting he be prescribed some hydrocodone.   Call back: [phone]

## 2020-01-16 NOTE — Telephone Encounter (Signed)
Please advise 

## 2020-01-17 MED ORDER — HYDROCODONE-ACETAMINOPHEN 5-325 MG PO TABS: 1.0000 | ORAL_TABLET | Freq: Two times a day (BID) | ORAL | 0 refills | Status: DC | PRN

## 2020-01-17 NOTE — Addendum Note (Signed)
Addended by: Hortencia Pilar on: 01/17/2020 08:03 AM   Modules accepted: Orders

## 2020-01-17 NOTE — Telephone Encounter (Signed)
Rx sent 

## 2020-02-10 ENCOUNTER — Ambulatory Visit: Payer: Self-pay

## 2020-02-10 ENCOUNTER — Ambulatory Visit (INDEPENDENT_AMBULATORY_CARE_PROVIDER_SITE_OTHER): Payer: Medicare Other | Admitting: Physical Medicine and Rehabilitation

## 2020-02-10 ENCOUNTER — Encounter: Payer: Self-pay | Admitting: Physical Medicine and Rehabilitation

## 2020-02-10 ENCOUNTER — Other Ambulatory Visit: Payer: Self-pay

## 2020-02-10 VITALS — BP 134/76 | HR 56

## 2020-02-10 DIAGNOSIS — M5416 Radiculopathy, lumbar region: Secondary | ICD-10-CM | POA: Diagnosis not present

## 2020-02-10 MED ORDER — METHYLPREDNISOLONE ACETATE 80 MG/ML IJ SUSP: 80.0000 mg | Freq: Once | INTRAMUSCULAR | Status: DC

## 2020-02-10 NOTE — Progress Notes (Signed)
Pt state lower back pain that travels to his left hip. Pt state any actives makes his pain worse. Pt state pain meds and ice packs to ease the pain.  Numeric Pain Rating Scale and Functional Assessment Average Pain 2   In the last MONTH (on 0-10 scale) has pain interfered with the following?  1. General activity like being  able to carry out your everyday physical activities such as walking, climbing stairs, carrying groceries, or moving a chair?  Rating(10)   +Driver, -BT, -Dye Allergies.

## 2020-02-11 DIAGNOSIS — R634 Abnormal weight loss: Secondary | ICD-10-CM | POA: Diagnosis not present

## 2020-02-11 DIAGNOSIS — Z23 Encounter for immunization: Secondary | ICD-10-CM | POA: Diagnosis not present

## 2020-02-11 DIAGNOSIS — I10 Essential (primary) hypertension: Secondary | ICD-10-CM | POA: Diagnosis not present

## 2020-02-11 DIAGNOSIS — E875 Hyperkalemia: Secondary | ICD-10-CM | POA: Diagnosis not present

## 2020-02-11 DIAGNOSIS — D72829 Elevated white blood cell count, unspecified: Secondary | ICD-10-CM | POA: Diagnosis not present

## 2020-02-13 NOTE — Procedures (Signed)
Lumbosacral Transforaminal Epidural Steroid Injection - Sub-Pedicular Approach with Fluoroscopic Guidance  Patient: Oscar [name]      Date of Birth: 06/01/1932 MRN: [id number] PCP: Deland Pretty, MD      Visit Date: 02/10/2020   Universal Protocol:    Date/Time: 02/10/2020  Consent Given By: the patient  Position: PRONE  Additional Comments: Vital signs were monitored before and after the procedure. Patient was prepped and draped in the usual sterile fashion. The correct patient, procedure, and site was verified.   Injection Procedure Details:  Procedure Site One Meds Administered:  Meds ordered this encounter  Medications  . methylPREDNISolone acetate (DEPO-MEDROL) injection 80 mg    Laterality: Left  Location/Site:  L4-L5  Needle size: 22 G  Needle type: Spinal  Needle Placement: Transforaminal  Findings:    -Comments: Excellent flow of contrast along the nerve, nerve root and into the epidural space.  Procedure Details: After squaring off the end-plates to get a true AP view, the C-arm was positioned so that an oblique view of the foramen as noted above was visualized. The target area is just inferior to the "nose of the scotty dog" or sub pedicular. The soft tissues overlying this structure were infiltrated with 2-3 ml. of 1% Lidocaine without Epinephrine.  The spinal needle was inserted toward the target using a "trajectory" view along the fluoroscope beam.  Under AP and lateral visualization, the needle was advanced so it did not puncture dura and was located close the 6 O'Clock position of the pedical in AP tracterory. Biplanar projections were used to confirm position. Aspiration was confirmed to be negative for CSF and/or blood. A 1-2 ml. volume of Isovue-250 was injected and flow of contrast was noted at each level. Radiographs were obtained for documentation purposes.   After attaining the desired flow of contrast documented above, a 0.5 to 1.0 ml test  dose of 0.25% Marcaine was injected into each respective transforaminal space.  The patient was observed for 90 seconds post injection.  After no sensory deficits were reported, and normal lower extremity motor function was noted,   the above injectate was administered so that equal amounts of the injectate were placed at each foramen (level) into the transforaminal epidural space.   Additional Comments:  The patient tolerated the procedure well Dressing: 2 x 2 sterile gauze and Band-Aid    Post-procedure details: Patient was observed during the procedure. Post-procedure instructions were reviewed.  Patient left the clinic in stable condition.

## 2020-02-13 NOTE — Progress Notes (Signed)
Oscar [name] - 86 y.o. male MRN [id number]  Date of birth: 10-13-1932  Office Visit Note: Visit Date: 02/10/2020 PCP: Deland Pretty, MD Referred by: Deland Pretty, MD  Subjective: Chief Complaint  Patient presents with  . Lower Back - Pain  . Left Hip - Pain   HPI:  Oscar [name] is a 86 y.o. male who comes in today at the request of Dr. Eunice Blase for planned Left L4-L5 Lumbar epidural steroid injection with fluoroscopic guidance.  The patient has failed conservative care including home exercise, medications, time and activity modification.  This injection will be diagnostic and hopefully therapeutic.  Please see requesting physician notes for further details and justification.  Severe at times low back pain that travels to the left hip.  Current pain today is 2 out of 10 but he gets a lot of pain with standing and movement and even just sitting.  Does get some relief with pain medication at times.  CT scan of the abdomen done in December does show disc herniation and stenosis at L4-5.  ROS Otherwise per HPI.  Assessment & Plan: Visit Diagnoses:  1. Lumbar radiculopathy     Plan: No additional findings.   Meds & Orders:  Meds ordered this encounter  Medications  . methylPREDNISolone acetate (DEPO-MEDROL) injection 80 mg    Orders Placed This Encounter  Procedures  . XR C-ARM NO REPORT  . Epidural Steroid injection    Follow-up: Return if symptoms worsen or fail to improve.   Procedures: No procedures performed  Lumbosacral Transforaminal Epidural Steroid Injection - Sub-Pedicular Approach with Fluoroscopic Guidance  Patient: Oscar [name]      Date of Birth: 14-Jan-1933 MRN: [id number] PCP: Deland Pretty, MD      Visit Date: 02/10/2020   Universal Protocol:    Date/Time: 02/10/2020  Consent Given By: the patient  Position: PRONE  Additional Comments: Vital signs were monitored before and after the procedure. Patient was prepped and draped in the usual  sterile fashion. The correct patient, procedure, and site was verified.   Injection Procedure Details:  Procedure Site One Meds Administered:  Meds ordered this encounter  Medications  . methylPREDNISolone acetate (DEPO-MEDROL) injection 80 mg    Laterality: Left  Location/Site:  L4-L5  Needle size: 22 G  Needle type: Spinal  Needle Placement: Transforaminal  Findings:    -Comments: Excellent flow of contrast along the nerve, nerve root and into the epidural space.  Procedure Details: After squaring off the end-plates to get a true AP view, the C-arm was positioned so that an oblique view of the foramen as noted above was visualized. The target area is just inferior to the "nose of the scotty dog" or sub pedicular. The soft tissues overlying this structure were infiltrated with 2-3 ml. of 1% Lidocaine without Epinephrine.  The spinal needle was inserted toward the target using a "trajectory" view along the fluoroscope beam.  Under AP and lateral visualization, the needle was advanced so it did not puncture dura and was located close the 6 O'Clock position of the pedical in AP tracterory. Biplanar projections were used to confirm position. Aspiration was confirmed to be negative for CSF and/or blood. A 1-2 ml. volume of Isovue-250 was injected and flow of contrast was noted at each level. Radiographs were obtained for documentation purposes.   After attaining the desired flow of contrast documented above, a 0.5 to 1.0 ml test dose of 0.25% Marcaine was injected into each respective transforaminal space.  The patient was observed for 90 seconds post injection.  After no sensory deficits were reported, and normal lower extremity motor function was noted,   the above injectate was administered so that equal amounts of the injectate were placed at each foramen (level) into the transforaminal epidural space.   Additional Comments:  The patient tolerated the procedure well Dressing: 2 x  2 sterile gauze and Band-Aid    Post-procedure details: Patient was observed during the procedure. Post-procedure instructions were reviewed.  Patient left the clinic in stable condition.      Clinical History: CT Abdomen Musculoskeletal: Mild degenerative disc disease at L3-4, L4-5 and L5-S1. Severe facet degenerative changes at these levels. Broad-based disc extrusion at L4-5 with severe multifactorial spinal stenosis at this level. Osseous demineralization. No acute findings.  IMPRESSION: 1. Partial small bowel obstruction with an abrupt transition to normal caliber bowel in the mid abdomen near the midline. This may be due to an adhesion or a focal stenosis within the small bowel. 2. Cholelithiasis without evidence of acute cholecystitis. 3. Descending and sigmoid colon diverticulosis without evidence of acute diverticulitis. 4. Moderate to marked prostate gland enlargement. 5. Broad-based disc extrusion at L4-5 with severe multifactorial spinal stenosis at this level.  Aortic Atherosclerosis (ICD10-I70.0).   Electronically Signed   By: Evangeline Dakin M.D.   On: 05/04/2019 18:47     Objective:  VS:  HT:    WT:   BMI:     BP:134/76  HR:(!) 56bpm  TEMP: ( )  RESP:  Physical Exam Constitutional:      General: He is not in acute distress.    Appearance: Normal appearance. He is not ill-appearing.  HENT:     Head: Normocephalic and atraumatic.     Right Ear: External ear normal.     Left Ear: External ear normal.  Eyes:     Extraocular Movements: Extraocular movements intact.  Cardiovascular:     Rate and Rhythm: Normal rate.     Pulses: Normal pulses.  Abdominal:     General: There is no distension.     Palpations: Abdomen is soft.  Musculoskeletal:        General: No tenderness or signs of injury.     Right lower leg: No edema.     Left lower leg: No edema.     Comments: Patient has good distal strength without clonus.  Skin:    Findings: No  erythema or rash.  Neurological:     General: No focal deficit present.     Mental Status: He is alert and oriented to person, place, and time.     Sensory: No sensory deficit.     Motor: No weakness or abnormal muscle tone.     Coordination: Coordination normal.  Psychiatric:        Mood and Affect: Mood normal.        Behavior: Behavior normal.      Imaging: No results found.

## 2020-02-28 DIAGNOSIS — G4733 Obstructive sleep apnea (adult) (pediatric): Secondary | ICD-10-CM | POA: Diagnosis not present

## 2020-03-02 DIAGNOSIS — E119 Type 2 diabetes mellitus without complications: Secondary | ICD-10-CM | POA: Diagnosis not present

## 2020-03-04 DIAGNOSIS — E875 Hyperkalemia: Secondary | ICD-10-CM | POA: Diagnosis not present

## 2020-03-04 DIAGNOSIS — D72829 Elevated white blood cell count, unspecified: Secondary | ICD-10-CM | POA: Diagnosis not present

## 2020-03-17 DIAGNOSIS — M9903 Segmental and somatic dysfunction of lumbar region: Secondary | ICD-10-CM | POA: Diagnosis not present

## 2020-03-17 DIAGNOSIS — M9904 Segmental and somatic dysfunction of sacral region: Secondary | ICD-10-CM | POA: Diagnosis not present

## 2020-03-17 DIAGNOSIS — M9905 Segmental and somatic dysfunction of pelvic region: Secondary | ICD-10-CM | POA: Diagnosis not present

## 2020-03-17 DIAGNOSIS — M5136 Other intervertebral disc degeneration, lumbar region: Secondary | ICD-10-CM | POA: Diagnosis not present

## 2020-03-18 ENCOUNTER — Telehealth: Payer: Self-pay

## 2020-03-18 NOTE — Telephone Encounter (Signed)
Pt received inj on 10/4 Left L4 TF.

## 2020-03-18 NOTE — Telephone Encounter (Signed)
Auth# not req. Called pt and lvm #1

## 2020-03-18 NOTE — Telephone Encounter (Signed)
Pt inform me that the inj helped his back but he has pain in his left hip. Please advise.

## 2020-03-18 NOTE — Telephone Encounter (Signed)
Patient called he wants to schedule a appointment with Dr.Newton for a injection. Call back:[phone]

## 2020-03-18 NOTE — Telephone Encounter (Signed)
If it helped a lot but did not last long can repeat, if not much help then L5-S1 interlam otherwise f/u with Dr. Junius Roads.

## 2020-03-19 NOTE — Telephone Encounter (Signed)
Called pt and sch 12/9 left l4 TF or poss. Left L4 Interlam.

## 2020-03-23 DIAGNOSIS — M109 Gout, unspecified: Secondary | ICD-10-CM | POA: Diagnosis not present

## 2020-03-23 DIAGNOSIS — E118 Type 2 diabetes mellitus with unspecified complications: Secondary | ICD-10-CM | POA: Diagnosis not present

## 2020-03-23 DIAGNOSIS — Z0001 Encounter for general adult medical examination with abnormal findings: Secondary | ICD-10-CM | POA: Diagnosis not present

## 2020-03-23 DIAGNOSIS — Z8501 Personal history of malignant neoplasm of esophagus: Secondary | ICD-10-CM | POA: Diagnosis not present

## 2020-03-23 DIAGNOSIS — I10 Essential (primary) hypertension: Secondary | ICD-10-CM | POA: Diagnosis not present

## 2020-03-31 ENCOUNTER — Telehealth: Payer: Self-pay | Admitting: Physical Medicine and Rehabilitation

## 2020-03-31 DIAGNOSIS — E118 Type 2 diabetes mellitus with unspecified complications: Secondary | ICD-10-CM | POA: Diagnosis not present

## 2020-03-31 DIAGNOSIS — I10 Essential (primary) hypertension: Secondary | ICD-10-CM | POA: Diagnosis not present

## 2020-03-31 DIAGNOSIS — Z8719 Personal history of other diseases of the digestive system: Secondary | ICD-10-CM | POA: Diagnosis not present

## 2020-03-31 DIAGNOSIS — E7800 Pure hypercholesterolemia, unspecified: Secondary | ICD-10-CM | POA: Diagnosis not present

## 2020-03-31 NOTE — Telephone Encounter (Signed)
Patient called requesting a call back. Patient is asking for a sooner appt if possible. Patient upcoming appt is Dec. 9, @ 9 am and need something sooner. Please call patient at [phone].

## 2020-03-31 NOTE — Telephone Encounter (Signed)
Called patient and left message to advise that we do not have any openings at this time and he is on the wait list.

## 2020-04-01 NOTE — Telephone Encounter (Signed)
Called pt and lvm #1 

## 2020-04-16 ENCOUNTER — Encounter: Payer: Self-pay | Admitting: Physical Medicine and Rehabilitation

## 2020-04-16 ENCOUNTER — Ambulatory Visit: Payer: Self-pay

## 2020-04-16 ENCOUNTER — Other Ambulatory Visit: Payer: Self-pay

## 2020-04-16 ENCOUNTER — Ambulatory Visit (INDEPENDENT_AMBULATORY_CARE_PROVIDER_SITE_OTHER): Payer: Medicare Other | Admitting: Physical Medicine and Rehabilitation

## 2020-04-16 VITALS — BP 170/81 | HR 62

## 2020-04-16 DIAGNOSIS — M5416 Radiculopathy, lumbar region: Secondary | ICD-10-CM

## 2020-04-16 DIAGNOSIS — M48062 Spinal stenosis, lumbar region with neurogenic claudication: Secondary | ICD-10-CM | POA: Diagnosis not present

## 2020-04-16 MED ORDER — METHYLPREDNISOLONE ACETATE 80 MG/ML IJ SUSP
80.0000 mg | Freq: Once | INTRAMUSCULAR | Status: AC
  Administered 2020-04-16: 80 mg

## 2020-04-16 NOTE — Procedures (Signed)
Lumbosacral Transforaminal Epidural Steroid Injection - Sub-Pedicular Approach with Fluoroscopic Guidance  Patient: Oscar [name]      Date of Birth: [birth year] MRN: [id number] PCP: Deland Pretty, MD      Visit Date: 04/16/2020   Universal Protocol:    Date/Time: 04/16/2020  Consent Given By: the patient  Position: PRONE  Additional Comments: Vital signs were monitored before and after the procedure. Patient was prepped and draped in the usual sterile fashion. The correct patient, procedure, and site was verified.   Injection Procedure Details:   Procedure diagnoses: Lumbar radiculopathy [M54.16]    Meds Administered:  Meds ordered this encounter  Medications  . methylPREDNISolone acetate (DEPO-MEDROL) injection 80 mg    Laterality: Left  Location/Site:  L4-L5  Needle:5.0 in., 22 ga.  Short bevel or Quincke spinal needle  Needle Placement: Transforaminal  Findings:    -Comments: Excellent flow of contrast along the nerve, nerve root and into the epidural space.  Procedure Details: After squaring off the end-plates to get a true AP view, the C-arm was positioned so that an oblique view of the foramen as noted above was visualized. The target area is just inferior to the "nose of the scotty dog" or sub pedicular. The soft tissues overlying this structure were infiltrated with 2-3 ml. of 1% Lidocaine without Epinephrine.  The spinal needle was inserted toward the target using a "trajectory" view along the fluoroscope beam.  Under AP and lateral visualization, the needle was advanced so it did not puncture dura and was located close the 6 O'Clock position of the pedical in AP tracterory. Biplanar projections were used to confirm position. Aspiration was confirmed to be negative for CSF and/or blood. A 1-2 ml. volume of Isovue-250 was injected and flow of contrast was noted at each level. Radiographs were obtained for documentation purposes.   After attaining the desired  flow of contrast documented above, a 0.5 to 1.0 ml test dose of 0.25% Marcaine was injected into each respective transforaminal space.  The patient was observed for 90 seconds post injection.  After no sensory deficits were reported, and normal lower extremity motor function was noted,   the above injectate was administered so that equal amounts of the injectate were placed at each foramen (level) into the transforaminal epidural space.   Additional Comments:  The patient tolerated the procedure well Dressing: 2 x 2 sterile gauze and Band-Aid    Post-procedure details: Patient was observed during the procedure. Post-procedure instructions were reviewed.  Patient left the clinic in stable condition.

## 2020-04-16 NOTE — Progress Notes (Signed)
Pt state lower back pain that travels to his left hip and leg. Pt state standing and sitting makes the pain worse. Pt state he try to find a comfortable position and takes pain meds to help ease the pain. Pt has hx of inj on 02/10/20 pt state it helped and lasted for about two months.  Numeric Pain Rating Scale and Functional Assessment Average Pain 8   In the last MONTH (on 0-10 scale) has pain interfered with the following?  1. General activity like being  able to carry out your everyday physical activities such as walking, climbing stairs, carrying groceries, or moving a chair?  Rating(10)   +Driver, -BT, -Dye Allergies.

## 2020-04-16 NOTE — Progress Notes (Signed)
Oscar [name] - 86 y.o. male MRN [id number]  Date of birth: 02-28-1933  Office Visit Note: Visit Date: 04/16/2020 PCP: Deland Pretty, MD Referred by: Deland Pretty, MD  Subjective: Chief Complaint  Patient presents with  . Lower Back - Pain  . Right Leg - Pain   HPI:  Oscar [name] is a 86 y.o. male who comes in today at the request of Dr. Eunice Blase for planned Left L4-L5 Lumbar epidural steroid injection with fluoroscopic guidance.  The patient has failed conservative care including home exercise, medications, time and activity modification.  This injection will be diagnostic and hopefully therapeutic.  Please see requesting physician notes for further details and justification.  MRI reviewed with images and spine model.  MRI reviewed in the note below.    ROS Otherwise per HPI.  Assessment & Plan: Visit Diagnoses:    ICD-10-CM   1. Lumbar radiculopathy  M54.16 XR C-ARM NO REPORT    Epidural Steroid injection    methylPREDNISolone acetate (DEPO-MEDROL) injection 80 mg  2. Spinal stenosis of lumbar region with neurogenic claudication  M48.062 XR C-ARM NO REPORT    Epidural Steroid injection    methylPREDNISolone acetate (DEPO-MEDROL) injection 80 mg    Plan: No additional findings.   Meds & Orders:  Meds ordered this encounter  Medications  . methylPREDNISolone acetate (DEPO-MEDROL) injection 80 mg    Orders Placed This Encounter  Procedures  . XR C-ARM NO REPORT  . Epidural Steroid injection    Follow-up: Return if symptoms worsen or fail to improve.   Procedures: No procedures performed  Lumbosacral Transforaminal Epidural Steroid Injection - Sub-Pedicular Approach with Fluoroscopic Guidance  Patient: Oscar [name]      Date of Birth: 11/22/1932 MRN: [id number] PCP: Deland Pretty, MD      Visit Date: 04/16/2020   Universal Protocol:    Date/Time: 04/16/2020  Consent Given By: the patient  Position: PRONE  Additional Comments: Vital signs were  monitored before and after the procedure. Patient was prepped and draped in the usual sterile fashion. The correct patient, procedure, and site was verified.   Injection Procedure Details:   Procedure diagnoses: Lumbar radiculopathy [M54.16]    Meds Administered:  Meds ordered this encounter  Medications  . methylPREDNISolone acetate (DEPO-MEDROL) injection 80 mg    Laterality: Left  Location/Site:  L4-L5  Needle:5.0 in., 22 ga.  Short bevel or Quincke spinal needle  Needle Placement: Transforaminal  Findings:    -Comments: Excellent flow of contrast along the nerve, nerve root and into the epidural space.  Procedure Details: After squaring off the end-plates to get a true AP view, the C-arm was positioned so that an oblique view of the foramen as noted above was visualized. The target area is just inferior to the "nose of the scotty dog" or sub pedicular. The soft tissues overlying this structure were infiltrated with 2-3 ml. of 1% Lidocaine without Epinephrine.  The spinal needle was inserted toward the target using a "trajectory" view along the fluoroscope beam.  Under AP and lateral visualization, the needle was advanced so it did not puncture dura and was located close the 6 O'Clock position of the pedical in AP tracterory. Biplanar projections were used to confirm position. Aspiration was confirmed to be negative for CSF and/or blood. A 1-2 ml. volume of Isovue-250 was injected and flow of contrast was noted at each level. Radiographs were obtained for documentation purposes.   After attaining the desired flow of contrast documented  above, a 0.5 to 1.0 ml test dose of 0.25% Marcaine was injected into each respective transforaminal space.  The patient was observed for 90 seconds post injection.  After no sensory deficits were reported, and normal lower extremity motor function was noted,   the above injectate was administered so that equal amounts of the injectate were placed at  each foramen (level) into the transforaminal epidural space.   Additional Comments:  The patient tolerated the procedure well Dressing: 2 x 2 sterile gauze and Band-Aid    Post-procedure details: Patient was observed during the procedure. Post-procedure instructions were reviewed.  Patient left the clinic in stable condition.      Clinical History: CT Abdomen Musculoskeletal: Mild degenerative disc disease at L3-4, L4-5 and L5-S1. Severe facet degenerative changes at these levels. Broad-based disc extrusion at L4-5 with severe multifactorial spinal stenosis at this level. Osseous demineralization. No acute findings.  IMPRESSION: 1. Partial small bowel obstruction with an abrupt transition to normal caliber bowel in the mid abdomen near the midline. This may be due to an adhesion or a focal stenosis within the small bowel. 2. Cholelithiasis without evidence of acute cholecystitis. 3. Descending and sigmoid colon diverticulosis without evidence of acute diverticulitis. 4. Moderate to marked prostate gland enlargement. 5. Broad-based disc extrusion at L4-5 with severe multifactorial spinal stenosis at this level. --- MRI LUMBAR SPINE WITHOUT CONTRAST  TECHNIQUE: Multiplanar, multisequence MR imaging of the lumbar spine was performed. No intravenous contrast was administered.  COMPARISON:  02/02/2013  FINDINGS: Segmentation:  Standard.  Alignment: Mild lumbar levoscoliosis. 6 mm anterolisthesis of L4 on L5, facet mediated and unchanged. Trace anterolisthesis of L5 on S1.  Vertebrae: Chronic mild diffuse bone marrow signal heterogeneity. No fracture or destructive osseous lesion. New small L3 inferior endplate Schmorl's node with minimal surrounding edema.  Conus medullaris: Extends to the T12 level and appears normal.  Paraspinal and other soft tissues: 1.5 cm T2 hyperintense lesion in the interpolar left kidney, most compatible with a cyst and  slightly enlarged in the interim.  Disc levels:  Disc desiccation throughout the lumbar spine with relative exception of L3-4. Mild disc space narrowing at L4-5.  T11-12: Mild disc bulging results in mild bilateral neural foraminal stenosis without spinal stenosis.  T12-L1:  Mild facet arthrosis without stenosis.  L1-2: Mild disc bulging has slightly progressed and, along with mild-to-moderate facet hypertrophy results in borderline bilateral lateral recess stenosis. No spinal or neural foraminal stenosis.  L2-3: Mild circumferential disc bulging and moderate facet and ligamentum flavum hypertrophy result in borderline bilateral lateral recess and borderline bilateral neural foraminal stenosis, overall stable to minimally progressed. No spinal stenosis.  L3-4: Disc bulging and moderate facet and ligamentum flavum hypertrophy result in moderate bilateral lateral recess stenosis and moderate right and mild left neural foraminal stenosis, slightly progressed from prior. Potential bilateral L4 nerve root impingement in the lateral recesses.  L4-5: Anterolisthesis with bulging uncovered disc and severe facet and ligamentum flavum hypertrophy result in severe spinal stenosis, severe bilateral lateral recess stenosis, and moderate bilateral neural foraminal stenosis, mildly progressed from prior. Bilateral L5 nerve root impingement in the lateral recesses.  L5-S1: Anterolisthesis with bulging uncovered disc, foraminal endplate spurring, ligamentum flavum thickening, and severe facet arthrosis result in mild right and mild-to-moderate left lateral recess stenosis and moderate left greater than right neural foraminal stenosis, overall stable to minimally progressed. No spinal stenosis.  IMPRESSION: 1. Severe multifactorial spinal stenosis at L4-5, mildly progressed from 2014. 2. Stable to minimally progressive  disc and facet degeneration elsewhere as  above.   Electronically Signed   By: Logan Bores M.D.   On: 02/06/2017 13:45  Aortic Atherosclerosis (ICD10-I70.0).   Electronically Signed By: Evangeline Dakin M.D. On: 05/04/2019 18:47     Objective:  VS:  HT:    WT:   BMI:     BP:(!) 170/81  HR:62bpm  TEMP: ( )  RESP:  Physical Exam Constitutional:      General: He is not in acute distress.    Appearance: Normal appearance. He is not ill-appearing.  HENT:     Head: Normocephalic and atraumatic.     Right Ear: External ear normal.     Left Ear: External ear normal.  Eyes:     Extraocular Movements: Extraocular movements intact.  Cardiovascular:     Rate and Rhythm: Normal rate.     Pulses: Normal pulses.  Abdominal:     General: There is no distension.     Palpations: Abdomen is soft.  Musculoskeletal:        General: No tenderness or signs of injury.     Right lower leg: No edema.     Left lower leg: No edema.     Comments: Patient has good distal strength without clonus.  Skin:    Findings: No erythema or rash.  Neurological:     General: No focal deficit present.     Mental Status: He is alert and oriented to person, place, and time.     Sensory: No sensory deficit.     Motor: No weakness or abnormal muscle tone.     Coordination: Coordination normal.  Psychiatric:        Mood and Affect: Mood normal.        Behavior: Behavior normal.      Imaging: No results found.

## 2020-04-23 DIAGNOSIS — R3915 Urgency of urination: Secondary | ICD-10-CM | POA: Diagnosis not present

## 2020-04-23 DIAGNOSIS — R319 Hematuria, unspecified: Secondary | ICD-10-CM | POA: Diagnosis not present

## 2020-05-14 ENCOUNTER — Ambulatory Visit (INDEPENDENT_AMBULATORY_CARE_PROVIDER_SITE_OTHER): Payer: Medicare Other | Admitting: Family Medicine

## 2020-05-14 ENCOUNTER — Other Ambulatory Visit: Payer: Self-pay

## 2020-05-14 ENCOUNTER — Encounter: Payer: Self-pay | Admitting: Family Medicine

## 2020-05-14 DIAGNOSIS — M545 Low back pain, unspecified: Secondary | ICD-10-CM | POA: Diagnosis not present

## 2020-05-14 DIAGNOSIS — M48062 Spinal stenosis, lumbar region with neurogenic claudication: Secondary | ICD-10-CM

## 2020-05-14 DIAGNOSIS — G8929 Other chronic pain: Secondary | ICD-10-CM | POA: Diagnosis not present

## 2020-05-14 MED ORDER — HYDROCODONE-ACETAMINOPHEN 2.5-325 MG PO TABS: ORAL_TABLET | ORAL | 0 refills | Status: DC

## 2020-05-14 MED ORDER — HYDROCODONE-ACETAMINOPHEN 2.5-325 MG PO TABS
ORAL_TABLET | ORAL | 0 refills | Status: DC
Start: 2020-05-14 — End: 2020-05-18

## 2020-05-14 NOTE — Progress Notes (Signed)
Office Visit Note   Patient: Oscar [name]           Date of Birth: 02-Aug-1932           MRN: [id number] Visit Date: 05/14/2020 Requested by: Merri Brunette, MD [address] [place],  [place] [address] PCP: Merri Brunette, MD  Subjective: Chief Complaint  Patient presents with  . Left Hip - Pain    S/p lumbar ESI 02/10/20. It did help the back pain some. Still hurts in the posterior left hip area. Hurts most with sitting. Ambulating with a cane.  . Lower Back - Pain    HPI: He is here for follow-up left posterior hip and leg pain.  He has severe stenosis at L4-5.  Epidural injection last month did give him some relief from his back pain, but he continues to have posterior hip pain fairly frequently.  In the past he did well with hydrocodone, taking one half of a 2.5 mg tablet 3 to 4 days/week along with a Tylenol.  This would give him several hours of good relief and allow him to be more functional.               ROS:   All other systems were reviewed and are negative.  Objective: Vital Signs: There were no vitals taken for this visit.  Physical Exam:  General:  Alert and oriented, in no acute distress. Pulm:  Breathing unlabored. Psy:  Normal mood, congruent affect.  Low back: He continues to have some tenderness in the left sciatic notch.  No pain and good range of motion with internal/external hip rotation, no significant greater trochanter tenderness.  Lower extremity strength and reflexes remain normal except for ankle dorsiflexion on the left which is weak at 4/5.    Imaging: No results found.  Assessment & Plan: 1.  L4-5 spinal stenosis with left leg radiculopathy and ankle dorsiflexion weakness -Prescription given for hydrocodone 2.5 mg tablets to take 1/2 tablet 3 to 4 days/week if needed.  As long as this allows him to be functional, he will follow-up as needed.     Procedures: No procedures performed        PMFS History: Patient Active  Problem List   Diagnosis Date Noted  . Hyponatremia 12/27/2019  . Hyperkalemia 12/26/2019  . ARF (acute renal failure) (HCC) 12/26/2019  . History of esophagectomy 12/26/2019  . Tobacco abuse 10/19/2018  . BPH (benign prostatic hyperplasia) 10/19/2018  . AKI (acute kidney injury) (HCC)   . Acute on chronic respiratory failure with hypoxia (HCC) 11/15/2017  . Malnutrition of moderate degree 02/15/2017  . Leukocytosis 02/12/2017  . OSA (obstructive sleep apnea) 02/12/2017  . Dehydration 02/12/2017  . SBO (small bowel obstruction) - recurrent 10/31/2016  . Aortic insufficiency 07/09/2013  . HTN (hypertension) 07/09/2013  . GERD 07/13/2009  . DYSPHAGIA 07/13/2009  . FLATULENCE-GAS-BLOATING 07/13/2009  . History of esophageal cancer 2001 07/18/1999   Past Medical History:  Diagnosis Date  . Acid reflux   . Barrett's esophagus   . BPH (benign prostatic hyperplasia)   . Colon polyps   . DDD (degenerative disc disease)   . Diabetes mellitus    controlled with diet and exercise  . ED (erectile dysfunction)   . Esophageal cancer (HCC) 2001   tx'd surgery - abdominal & right posterior chest approach (2001) - [facility] [place], [place]  . First degree AV block   . Gout   . Hemorrhoids   . History of palpitations   .  Hyperlipidemia   . Hypertension   . Lipoma   . Obesity   . Osteoarthritis   . Small bowel obstruction ([place])   . Tinea versicolor   . Tinnitus     Family History  Problem Relation Age of Onset  . Diabetes Mother   . CAD Mother   . Stroke Sister   . Colon cancer Neg Hx   . Esophageal cancer Neg Hx   . Rectal cancer Neg Hx   . Stomach cancer Neg Hx     Past Surgical History:  Procedure Laterality Date  . bilateral olecranon bursal excisions    . ESOPHAGECTOMY  2001   [facility] Thoraicic/Abd approach.  High Grade Dysplasia w esophageal CA  . HEMORRHOID SURGERY    . LAPAROSCOPY N/A 12/27/2019   Procedure: LAPAROSCOPY DIAGNOSTI; LYSIS OF ADHESIONS;  Surgeon:   Boston, MD;  Location: [facility];  Service: General;  Laterality: N/A;  . NM MYOCAR [place]   dipyridamole myoview - stress images show medium in size, moderate in intensity perfusion defect in basal inferior & mid inferior walls with mild defect reversibility at rest, EF 64%  . TRANSTHORACIC ECHOCARDIOGRAM  2011   EF=>55%, mild mitral annular calcif, mild calcif of MV apparatus, mild TR, normal RSVP, mild AV regurg, aortic root sclerosis/calcifiication  . VASECTOMY     Social History   Occupational History  . Occupation: self employed  Tobacco Use  . Smoking status: Current Some Day Smoker    Years: 50.00    Types: Cigars, Pipe  . Smokeless tobacco: Never Used  . Tobacco comment: 2-3x/daily, sometimes not at all , tobacco info given 08/01/13  Vaping Use  . Vaping Use: Never used  Substance and Sexual Activity  . Alcohol use: Yes    Alcohol/week: 7.0 - 14.0 standard drinks    Types: 7 - 14 Standard drinks or equivalent per week  . Drug use: No  . Sexual activity: Not on file

## 2020-05-18 ENCOUNTER — Other Ambulatory Visit: Payer: Self-pay | Admitting: Family Medicine

## 2020-05-18 MED ORDER — HYDROCODONE-ACETAMINOPHEN 5-325 MG PO TABS
0.5000 | ORAL_TABLET | Freq: Every evening | ORAL | 0 refills | Status: DC | PRN
Start: 2020-05-18 — End: 2020-06-29

## 2020-05-22 ENCOUNTER — Other Ambulatory Visit: Payer: Self-pay | Admitting: Family Medicine

## 2020-06-29 ENCOUNTER — Other Ambulatory Visit: Payer: Self-pay | Admitting: Family Medicine

## 2020-06-29 MED ORDER — HYDROCODONE-ACETAMINOPHEN 2.5-108 MG/5ML PO SOLN: 5.0000 mL | Freq: Every evening | ORAL | 0 refills | Status: DC | PRN

## 2020-07-07 ENCOUNTER — Telehealth: Payer: Self-pay

## 2020-07-07 NOTE — Telephone Encounter (Signed)
Patient called he is requesting a appointment with Dr.Newton patient wanted me to let you guys know to give him a few minutes when calling due to it taking him a while to get to the phone call [phone]

## 2020-07-07 NOTE — Telephone Encounter (Signed)
Left L4 TF on 04/16/20. Ok to repeat if helped, same problem/side, and no new injury?

## 2020-07-08 NOTE — Telephone Encounter (Signed)
Pt not req Auth#. 

## 2020-07-08 NOTE — Telephone Encounter (Signed)
Scheduled for 3/3 at 1430 with driver.

## 2020-07-08 NOTE — Telephone Encounter (Signed)
Pt returned call for courtney.

## 2020-07-08 NOTE — Telephone Encounter (Signed)
Left message #1

## 2020-07-08 NOTE — Telephone Encounter (Signed)
Ok repeat

## 2020-07-09 ENCOUNTER — Encounter: Payer: Self-pay | Admitting: Physical Medicine and Rehabilitation

## 2020-07-09 ENCOUNTER — Ambulatory Visit (INDEPENDENT_AMBULATORY_CARE_PROVIDER_SITE_OTHER): Payer: Medicare Other | Admitting: Physical Medicine and Rehabilitation

## 2020-07-09 ENCOUNTER — Telehealth: Payer: Self-pay | Admitting: Family Medicine

## 2020-07-09 ENCOUNTER — Other Ambulatory Visit: Payer: Self-pay

## 2020-07-09 ENCOUNTER — Ambulatory Visit: Payer: Self-pay

## 2020-07-09 VITALS — BP 176/80 | HR 64

## 2020-07-09 DIAGNOSIS — M5416 Radiculopathy, lumbar region: Secondary | ICD-10-CM | POA: Diagnosis not present

## 2020-07-09 DIAGNOSIS — M48062 Spinal stenosis, lumbar region with neurogenic claudication: Secondary | ICD-10-CM | POA: Diagnosis not present

## 2020-07-09 MED ORDER — METHYLPREDNISOLONE ACETATE 80 MG/ML IJ SUSP
80.0000 mg | Freq: Once | INTRAMUSCULAR | Status: AC
  Administered 2020-07-09: 80 mg

## 2020-07-09 NOTE — Progress Notes (Signed)
Oscar [name] - 87 y.o. male MRN [id number]  Date of birth: December 16, 1932  Office Visit Note: Visit Date: 07/09/2020 PCP: Deland Pretty, MD Referred by: Deland Pretty, MD  Subjective: Chief Complaint  Patient presents with  . Lower Back - Pain  . Right Hip - Pain  . Left Hip - Pain   HPI:  Oscar [name] is a 87 y.o. male who comes in today at the request of Dr. Eunice Blase for planned Left L4-L5 Lumbar epidural steroid injection with fluoroscopic guidance.  The patient has failed conservative care including home exercise, medications, time and activity modification.  This injection will be diagnostic and hopefully therapeutic.  Please see requesting physician notes for further details and justification.  Prior MRI and more recent CT scan reviewed.  Patient has severe multifactorial stenosis at L4-5.  Patient does ask for refill of his hydrocodone.  He reports taking this very judiciously.  We will send a message to Dr. Junius Roads for refill.  If needed I would temporarily do that as well.   ROS Otherwise per HPI.  Assessment & Plan: Visit Diagnoses:    ICD-10-CM   1. Lumbar radiculopathy  M54.16 XR C-ARM NO REPORT    Epidural Steroid injection    methylPREDNISolone acetate (DEPO-MEDROL) injection 80 mg  2. Spinal stenosis of lumbar region with neurogenic claudication  M48.062 XR C-ARM NO REPORT    Epidural Steroid injection    methylPREDNISolone acetate (DEPO-MEDROL) injection 80 mg    Plan: No additional findings.   Meds & Orders:  Meds ordered this encounter  Medications  . methylPREDNISolone acetate (DEPO-MEDROL) injection 80 mg    Orders Placed This Encounter  Procedures  . XR C-ARM NO REPORT  . Epidural Steroid injection    Follow-up: Return for visit to requesting physician as needed.   Procedures: No procedures performed  Lumbosacral Transforaminal Epidural Steroid Injection - Sub-Pedicular Approach with Fluoroscopic Guidance  Patient: Oscar [name]      Date  of Birth: 1933-02-11 MRN: [id number] PCP: Deland Pretty, MD      Visit Date: 07/09/2020   Universal Protocol:    Date/Time: 07/09/2020  Consent Given By: the patient  Position: PRONE  Additional Comments: Vital signs were monitored before and after the procedure. Patient was prepped and draped in the usual sterile fashion. The correct patient, procedure, and site was verified.   Injection Procedure Details:   Procedure diagnoses: Lumbar radiculopathy [M54.16]    Meds Administered:  Meds ordered this encounter  Medications  . methylPREDNISolone acetate (DEPO-MEDROL) injection 80 mg    Laterality: Left  Location/Site:  L4-L5  Needle:5.0 in., 22 ga.  Short bevel or Quincke spinal needle  Needle Placement: Transforaminal  Findings:    -Comments: Excellent flow of contrast along the nerve, nerve root and into the epidural space.  Procedure Details: After squaring off the end-plates to get a true AP view, the C-arm was positioned so that an oblique view of the foramen as noted above was visualized. The target area is just inferior to the "nose of the scotty dog" or sub pedicular. The soft tissues overlying this structure were infiltrated with 2-3 ml. of 1% Lidocaine without Epinephrine.  The spinal needle was inserted toward the target using a "trajectory" view along the fluoroscope beam.  Under AP and lateral visualization, the needle was advanced so it did not puncture dura and was located close the 6 O'Clock position of the pedical in AP tracterory. Biplanar projections were used to confirm  position. Aspiration was confirmed to be negative for CSF and/or blood. A 1-2 ml. volume of Isovue-250 was injected and flow of contrast was noted at each level. Radiographs were obtained for documentation purposes.   After attaining the desired flow of contrast documented above, a 0.5 to 1.0 ml test dose of 0.25% Marcaine was injected into each respective transforaminal space.  The  patient was observed for 90 seconds post injection.  After no sensory deficits were reported, and normal lower extremity motor function was noted,   the above injectate was administered so that equal amounts of the injectate were placed at each foramen (level) into the transforaminal epidural space.   Additional Comments:  The patient tolerated the procedure well Dressing: 2 x 2 sterile gauze and Band-Aid    Post-procedure details: Patient was observed during the procedure. Post-procedure instructions were reviewed.  Patient left the clinic in stable condition.      Clinical History: CT Abdomen Musculoskeletal: Mild degenerative disc disease at L3-4, L4-5 and L5-S1. Severe facet degenerative changes at these levels. Broad-based disc extrusion at L4-5 with severe multifactorial spinal stenosis at this level. Osseous demineralization. No acute findings.  IMPRESSION: 1. Partial small bowel obstruction with an abrupt transition to normal caliber bowel in the mid abdomen near the midline. This may be due to an adhesion or a focal stenosis within the small bowel. 2. Cholelithiasis without evidence of acute cholecystitis. 3. Descending and sigmoid colon diverticulosis without evidence of acute diverticulitis. 4. Moderate to marked prostate gland enlargement. 5. Broad-based disc extrusion at L4-5 with severe multifactorial spinal stenosis at this level. --- MRI LUMBAR SPINE WITHOUT CONTRAST  TECHNIQUE: Multiplanar, multisequence MR imaging of the lumbar spine was performed. No intravenous contrast was administered.  COMPARISON:  02/02/2013  FINDINGS: Segmentation:  Standard.  Alignment: Mild lumbar levoscoliosis. 6 mm anterolisthesis of L4 on L5, facet mediated and unchanged. Trace anterolisthesis of L5 on S1.  Vertebrae: Chronic mild diffuse bone marrow signal heterogeneity. No fracture or destructive osseous lesion. New small L3 inferior endplate Schmorl's node with  minimal surrounding edema.  Conus medullaris: Extends to the T12 level and appears normal.  Paraspinal and other soft tissues: 1.5 cm T2 hyperintense lesion in the interpolar left kidney, most compatible with a cyst and slightly enlarged in the interim.  Disc levels:  Disc desiccation throughout the lumbar spine with relative exception of L3-4. Mild disc space narrowing at L4-5.  T11-12: Mild disc bulging results in mild bilateral neural foraminal stenosis without spinal stenosis.  T12-L1:  Mild facet arthrosis without stenosis.  L1-2: Mild disc bulging has slightly progressed and, along with mild-to-moderate facet hypertrophy results in borderline bilateral lateral recess stenosis. No spinal or neural foraminal stenosis.  L2-3: Mild circumferential disc bulging and moderate facet and ligamentum flavum hypertrophy result in borderline bilateral lateral recess and borderline bilateral neural foraminal stenosis, overall stable to minimally progressed. No spinal stenosis.  L3-4: Disc bulging and moderate facet and ligamentum flavum hypertrophy result in moderate bilateral lateral recess stenosis and moderate right and mild left neural foraminal stenosis, slightly progressed from prior. Potential bilateral L4 nerve root impingement in the lateral recesses.  L4-5: Anterolisthesis with bulging uncovered disc and severe facet and ligamentum flavum hypertrophy result in severe spinal stenosis, severe bilateral lateral recess stenosis, and moderate bilateral neural foraminal stenosis, mildly progressed from prior. Bilateral L5 nerve root impingement in the lateral recesses.  L5-S1: Anterolisthesis with bulging uncovered disc, foraminal endplate spurring, ligamentum flavum thickening, and severe facet arthrosis result  in mild right and mild-to-moderate left lateral recess stenosis and moderate left greater than right neural foraminal stenosis, overall stable to minimally  progressed. No spinal stenosis.  IMPRESSION: 1. Severe multifactorial spinal stenosis at L4-5, mildly progressed from 2014. 2. Stable to minimally progressive disc and facet degeneration elsewhere as above.   Electronically Signed   By: Logan Bores M.D.   On: 02/06/2017 13:45  Aortic Atherosclerosis (ICD10-I70.0).   Electronically Signed By: Evangeline Dakin M.D. On: 05/04/2019 18:47     Objective:  VS:  HT:    WT:   BMI:     BP:(!) 176/80  HR:64bpm  TEMP: ( )  RESP:  Physical Exam Vitals and nursing note reviewed.  Constitutional:      General: He is not in acute distress.    Appearance: Normal appearance. He is not ill-appearing.  HENT:     Head: Normocephalic and atraumatic.     Right Ear: External ear normal.     Left Ear: External ear normal.     Nose: No congestion.  Eyes:     Extraocular Movements: Extraocular movements intact.  Cardiovascular:     Rate and Rhythm: Normal rate.     Pulses: Normal pulses.  Pulmonary:     Effort: Pulmonary effort is normal. No respiratory distress.  Abdominal:     General: There is no distension.     Palpations: Abdomen is soft.  Musculoskeletal:        General: No tenderness or signs of injury.     Cervical back: Neck supple.     Right lower leg: No edema.     Left lower leg: No edema.     Comments: Patient has good distal strength without clonus.  Skin:    Findings: No erythema or rash.  Neurological:     General: No focal deficit present.     Mental Status: He is alert and oriented to person, place, and time.     Sensory: No sensory deficit.     Motor: No weakness or abnormal muscle tone.     Coordination: Coordination normal.  Psychiatric:        Mood and Affect: Mood normal.        Behavior: Behavior normal.      Imaging: No results found.

## 2020-07-09 NOTE — Progress Notes (Signed)
Pt state lower back pain that travels to both side of his hips. Pt state standing for a long time makes the pain worse. Pt state he take pain meds to help ease the pain. Pt has hx of inj on 04/16/20 pt state it worked well and lasted a month and an half.  Numeric Pain Rating Scale and Functional Assessment Average Pain 9   In the last MONTH (on 0-10 scale) has pain interfered with the following?  1. General activity like being  able to carry out your everyday physical activities such as walking, climbing stairs, carrying groceries, or moving a chair?  Rating(10)   +Driver, -BT, -Dye Allergies.

## 2020-07-09 NOTE — Telephone Encounter (Signed)
Patient was here today for an ESI. He requested a refill of hydrocodone-acetaminophen. This was last filled by Dr. Junius Roads on 06/29/20. Please advise.

## 2020-07-09 NOTE — Patient Instructions (Signed)

## 2020-07-10 MED ORDER — HYDROCODONE-ACETAMINOPHEN 2.5-108 MG/5ML PO SOLN
5.0000 mL | Freq: Every evening | ORAL | 0 refills | Status: DC | PRN
Start: 2020-07-10 — End: 2020-07-27

## 2020-07-10 NOTE — Telephone Encounter (Signed)
I called and advised the patient that his refill has been sent in to his pharmacy.

## 2020-07-10 NOTE — Telephone Encounter (Signed)
Sent!

## 2020-07-23 DIAGNOSIS — M9905 Segmental and somatic dysfunction of pelvic region: Secondary | ICD-10-CM | POA: Diagnosis not present

## 2020-07-23 DIAGNOSIS — G4733 Obstructive sleep apnea (adult) (pediatric): Secondary | ICD-10-CM | POA: Diagnosis not present

## 2020-07-23 DIAGNOSIS — M9904 Segmental and somatic dysfunction of sacral region: Secondary | ICD-10-CM | POA: Diagnosis not present

## 2020-07-23 DIAGNOSIS — M5136 Other intervertebral disc degeneration, lumbar region: Secondary | ICD-10-CM | POA: Diagnosis not present

## 2020-07-23 DIAGNOSIS — M9903 Segmental and somatic dysfunction of lumbar region: Secondary | ICD-10-CM | POA: Diagnosis not present

## 2020-07-24 ENCOUNTER — Telehealth: Payer: Self-pay | Admitting: Physical Medicine and Rehabilitation

## 2020-07-24 NOTE — Telephone Encounter (Signed)
Patient called advised he was trying to put the Hydrocodone into the syringe and spilled the whole thing. Patient said he called the pharmacy and the pharmacy advised him to contact Dr. Romona Curls office. Patient said he uses [facility] on [address]. The number to contact patient is [phone]

## 2020-07-24 NOTE — Telephone Encounter (Signed)
Please Advise

## 2020-07-27 MED ORDER — HYDROCODONE-ACETAMINOPHEN 2.5-108 MG/5ML PO SOLN: 5.0000 mL | Freq: Every evening | ORAL | 0 refills | Status: DC | PRN

## 2020-07-27 NOTE — Telephone Encounter (Signed)
When you get a message like this particularly if it involves an opioid please review the notes and encounters to see if it is actually something we have prescribed or if it is a patient that we typically see.  This is a patient that I have only seen for injection.  The hydrocodone was prescribed by Dr. Junius Roads and this is all in the encounters that can be read.  I hope this was eventually made aware to Dr. Junius Roads.  Just as an FYI however I am not sure why hydrocodone would be put into a syringe.

## 2020-07-27 NOTE — Telephone Encounter (Signed)
Please advise 

## 2020-07-27 NOTE — Procedures (Signed)
Lumbosacral Transforaminal Epidural Steroid Injection - Sub-Pedicular Approach with Fluoroscopic Guidance  Patient: Oscar [name]      Date of Birth: 1933/04/09 MRN: [id number] PCP: Deland Pretty, MD      Visit Date: 07/09/2020   Universal Protocol:    Date/Time: 07/09/2020  Consent Given By: the patient  Position: PRONE  Additional Comments: Vital signs were monitored before and after the procedure. Patient was prepped and draped in the usual sterile fashion. The correct patient, procedure, and site was verified.   Injection Procedure Details:   Procedure diagnoses: Lumbar radiculopathy [M54.16]    Meds Administered:  Meds ordered this encounter  Medications  . methylPREDNISolone acetate (DEPO-MEDROL) injection 80 mg    Laterality: Left  Location/Site:  L4-L5  Needle:5.0 in., 22 ga.  Short bevel or Quincke spinal needle  Needle Placement: Transforaminal  Findings:    -Comments: Excellent flow of contrast along the nerve, nerve root and into the epidural space.  Procedure Details: After squaring off the end-plates to get a true AP view, the C-arm was positioned so that an oblique view of the foramen as noted above was visualized. The target area is just inferior to the "nose of the scotty dog" or sub pedicular. The soft tissues overlying this structure were infiltrated with 2-3 ml. of 1% Lidocaine without Epinephrine.  The spinal needle was inserted toward the target using a "trajectory" view along the fluoroscope beam.  Under AP and lateral visualization, the needle was advanced so it did not puncture dura and was located close the 6 O'Clock position of the pedical in AP tracterory. Biplanar projections were used to confirm position. Aspiration was confirmed to be negative for CSF and/or blood. A 1-2 ml. volume of Isovue-250 was injected and flow of contrast was noted at each level. Radiographs were obtained for documentation purposes.   After attaining the desired  flow of contrast documented above, a 0.5 to 1.0 ml test dose of 0.25% Marcaine was injected into each respective transforaminal space.  The patient was observed for 90 seconds post injection.  After no sensory deficits were reported, and normal lower extremity motor function was noted,   the above injectate was administered so that equal amounts of the injectate were placed at each foramen (level) into the transforaminal epidural space.   Additional Comments:  The patient tolerated the procedure well Dressing: 2 x 2 sterile gauze and Band-Aid    Post-procedure details: Patient was observed during the procedure. Post-procedure instructions were reviewed.  Patient left the clinic in stable condition.

## 2020-07-27 NOTE — Telephone Encounter (Signed)
As a one-time event, I will call in the refill.  Can't do early refills in the future.

## 2020-07-27 NOTE — Telephone Encounter (Signed)
I called and advised the patient. 

## 2020-07-28 DIAGNOSIS — M9904 Segmental and somatic dysfunction of sacral region: Secondary | ICD-10-CM | POA: Diagnosis not present

## 2020-07-28 DIAGNOSIS — M5136 Other intervertebral disc degeneration, lumbar region: Secondary | ICD-10-CM | POA: Diagnosis not present

## 2020-07-28 DIAGNOSIS — M9905 Segmental and somatic dysfunction of pelvic region: Secondary | ICD-10-CM | POA: Diagnosis not present

## 2020-07-28 DIAGNOSIS — M9903 Segmental and somatic dysfunction of lumbar region: Secondary | ICD-10-CM | POA: Diagnosis not present

## 2020-07-30 DIAGNOSIS — M9905 Segmental and somatic dysfunction of pelvic region: Secondary | ICD-10-CM | POA: Diagnosis not present

## 2020-07-30 DIAGNOSIS — M9904 Segmental and somatic dysfunction of sacral region: Secondary | ICD-10-CM | POA: Diagnosis not present

## 2020-07-30 DIAGNOSIS — M5136 Other intervertebral disc degeneration, lumbar region: Secondary | ICD-10-CM | POA: Diagnosis not present

## 2020-07-30 DIAGNOSIS — M9903 Segmental and somatic dysfunction of lumbar region: Secondary | ICD-10-CM | POA: Diagnosis not present

## 2020-07-31 ENCOUNTER — Telehealth: Payer: Self-pay

## 2020-07-31 NOTE — Telephone Encounter (Signed)
Patent called he stated he fell in a hole and hurt his back and his knee , he stated his back hurts really bad and he think he needs another injection call [phone]

## 2020-08-03 DIAGNOSIS — M9905 Segmental and somatic dysfunction of pelvic region: Secondary | ICD-10-CM | POA: Diagnosis not present

## 2020-08-03 DIAGNOSIS — M9903 Segmental and somatic dysfunction of lumbar region: Secondary | ICD-10-CM | POA: Diagnosis not present

## 2020-08-03 DIAGNOSIS — M9904 Segmental and somatic dysfunction of sacral region: Secondary | ICD-10-CM | POA: Diagnosis not present

## 2020-08-03 DIAGNOSIS — M5136 Other intervertebral disc degeneration, lumbar region: Secondary | ICD-10-CM | POA: Diagnosis not present

## 2020-08-03 NOTE — Telephone Encounter (Signed)
Please advise 

## 2020-08-03 NOTE — Telephone Encounter (Signed)
FYI. I called the patient and scheduled him for Wednesday March 30 at 1420 with Dr. Junius Roads.

## 2020-08-03 NOTE — Telephone Encounter (Signed)
He should follow up with Dr. Junius Roads for eval post fall

## 2020-08-05 ENCOUNTER — Encounter: Payer: Self-pay | Admitting: Family Medicine

## 2020-08-05 ENCOUNTER — Ambulatory Visit (INDEPENDENT_AMBULATORY_CARE_PROVIDER_SITE_OTHER): Payer: Medicare Other | Admitting: Family Medicine

## 2020-08-05 ENCOUNTER — Other Ambulatory Visit: Payer: Self-pay

## 2020-08-05 DIAGNOSIS — M545 Low back pain, unspecified: Secondary | ICD-10-CM

## 2020-08-05 NOTE — Progress Notes (Signed)
Betamethasone 6 mg (1cc) given IM right upper outer gluteal region.  Dexamethosone 4 mg (1cc) given IM left upper outer gluteal region.

## 2020-08-05 NOTE — Progress Notes (Signed)
Office Visit Note   Patient: Oscar [name]           Date of Birth: 04/05/1933           MRN: [id number] Visit Date: 08/05/2020 Requested by: Deland Pretty, MD [address] [place],  [place] [address] PCP: Deland Pretty, MD  Subjective: Chief Complaint  Patient presents with  . Lower Back - Pain    Last Thursday, he stepped in a hole and had a "controlled fall." The back has been sore, into his hips, since then.    HPI: He is here with low back pain.  This past Thursday he was helping somebody repair an engine, stepped awkwardly and stumbled causing him to slowly fall on his side.  He was able to control his fall so that he did not hit hard.  His left knee was twisted underneath him and it hurt initially, but it feels better now.  His right lower back has been bothering him since then.  He began seeing Dr. Teryl Lucy, his chiropractor, and has had some treatments but he is not getting adequate pain relief.  He has done well in the past with epidural steroid injections and would like to have one now.               ROS:   All other systems were reviewed and are negative.  Objective: Vital Signs: There were no vitals taken for this visit.  Physical Exam:  General:  Alert and oriented, in no acute distress. Pulm:  Breathing unlabored. Psy:  Normal mood, congruent affect. Skin: No bruising Low back: He does not have any tenderness to palpation of the lumbar spinous processes or SI joints.  Slight tenderness near the right gluteus medius region.  Straight leg raise negative, lower extremity strength and reflexes are normal and equal except for ankle dorsiflexion on the left which is chronically slightly weaker than the right.    Imaging: No results found.  Assessment & Plan: 1.  Flareup of low back pain with history of spinal stenosis -Intramuscular injection given today.  Referral to Dr. Ernestina Patches for epidural steroid injection.  He has hydrocodone to take as  needed.     Procedures: No procedures performed        PMFS History: Patient Active Problem List   Diagnosis Date Noted  . Hyponatremia 12/27/2019  . Hyperkalemia 12/26/2019  . ARF (acute renal failure) ([place]) 12/26/2019  . History of esophagectomy 12/26/2019  . Tobacco abuse 10/19/2018  . BPH (benign prostatic hyperplasia) 10/19/2018  . AKI (acute kidney injury) ([place])   . Acute on chronic respiratory failure with hypoxia ([place]) 11/15/2017  . Malnutrition of moderate degree 02/15/2017  . Leukocytosis 02/12/2017  . OSA (obstructive sleep apnea) 02/12/2017  . Dehydration 02/12/2017  . SBO (small bowel obstruction) - recurrent 10/31/2016  . Aortic insufficiency 07/09/2013  . HTN (hypertension) 07/09/2013  . GERD 07/13/2009  . DYSPHAGIA 07/13/2009  . FLATULENCE-GAS-BLOATING 07/13/2009  . History of esophageal cancer 2001 07/18/1999   Past Medical History:  Diagnosis Date  . Acid reflux   . Barrett's esophagus   . BPH (benign prostatic hyperplasia)   . Colon polyps   . DDD (degenerative disc disease)   . Diabetes mellitus    controlled with diet and exercise  . ED (erectile dysfunction)   . Esophageal cancer ([place]) 2001   tx'd surgery - abdominal & right posterior chest approach (2001) - [place], [place]  . First degree AV block   .  Gout   . Hemorrhoids   . History of palpitations   . Hyperlipidemia   . Hypertension   . Lipoma   . Obesity   . Osteoarthritis   . Small bowel obstruction ([place])   . Tinea versicolor   . Tinnitus     Family History  Problem Relation Age of Onset  . Diabetes Mother   . CAD Mother   . Stroke Sister   . Colon cancer Neg Hx   . Esophageal cancer Neg Hx   . Rectal cancer Neg Hx   . Stomach cancer Neg Hx     Past Surgical History:  Procedure Laterality Date  . bilateral olecranon bursal excisions    . ESOPHAGECTOMY  2001   [place]/Abd approach.  High Grade Dysplasia w esophageal CA  . HEMORRHOID SURGERY    .  LAPAROSCOPY N/A 12/27/2019   Procedure: LAPAROSCOPY DIAGNOSTI; LYSIS OF ADHESIONS;  Surgeon:  Boston, MD;  Location: [facility];  Service: General;  Laterality: N/A;  . NM MYOCAR [place]   dipyridamole myoview - stress images show medium in size, moderate in intensity perfusion defect in basal inferior & mid inferior walls with mild defect reversibility at rest, EF 64%  . TRANSTHORACIC ECHOCARDIOGRAM  2011   EF=>55%, mild mitral annular calcif, mild calcif of MV apparatus, mild TR, normal RSVP, mild AV regurg, aortic root sclerosis/calcifiication  . VASECTOMY     Social History   Occupational History  . Occupation: self employed  Tobacco Use  . Smoking status: Current Some Day Smoker    Years: 50.00    Types: Cigars, Pipe  . Smokeless tobacco: Never Used  . Tobacco comment: 2-3x/daily, sometimes not at all , tobacco info given 08/01/13  Vaping Use  . Vaping Use: Never used  Substance and Sexual Activity  . Alcohol use: Yes    Alcohol/week: 7.0 - 14.0 standard drinks    Types: 7 - 14 Standard drinks or equivalent per week  . Drug use: No  . Sexual activity: Not on file

## 2020-08-24 ENCOUNTER — Encounter: Payer: Self-pay | Admitting: Physical Medicine and Rehabilitation

## 2020-08-24 ENCOUNTER — Other Ambulatory Visit: Payer: Self-pay

## 2020-08-24 ENCOUNTER — Ambulatory Visit: Payer: Self-pay

## 2020-08-24 ENCOUNTER — Ambulatory Visit (INDEPENDENT_AMBULATORY_CARE_PROVIDER_SITE_OTHER): Payer: Medicare Other | Admitting: Physical Medicine and Rehabilitation

## 2020-08-24 VITALS — BP 174/98 | HR 60

## 2020-08-24 DIAGNOSIS — M5416 Radiculopathy, lumbar region: Secondary | ICD-10-CM | POA: Diagnosis not present

## 2020-08-24 DIAGNOSIS — M48062 Spinal stenosis, lumbar region with neurogenic claudication: Secondary | ICD-10-CM | POA: Diagnosis not present

## 2020-08-24 MED ORDER — BETAMETHASONE SOD PHOS & ACET 6 (3-3) MG/ML IJ SUSP
12.0000 mg | Freq: Once | INTRAMUSCULAR | Status: AC
  Administered 2020-08-24: 12 mg

## 2020-08-24 NOTE — Progress Notes (Signed)
Oscar [name] - 87 y.o. male MRN [id number]  Date of birth: [birth year]  Office Visit Note: Visit Date: 08/24/2020 PCP: Deland Pretty, MD Referred by: Deland Pretty, MD  Subjective: Chief Complaint  Patient presents with  . Lower Back - Pain   HPI:  Oscar [name] is a 87 y.o. male who comes in today at the request of Dr. Eunice Blase for planned Left L5-S1 Lumbar epidural steroid injection with fluoroscopic guidance.  The patient has failed conservative care including home exercise, medications, time and activity modification.  This injection will be diagnostic and hopefully therapeutic.  Please see requesting physician notes for further details and justification.  ROS Otherwise per HPI.  Assessment & Plan: Visit Diagnoses:    ICD-10-CM   1. Lumbar radiculopathy  M54.16 XR C-ARM NO REPORT    Epidural Steroid injection    betamethasone acetate-betamethasone sodium phosphate (CELESTONE) injection 12 mg  2. Spinal stenosis of lumbar region with neurogenic claudication  M48.062 XR C-ARM NO REPORT    Epidural Steroid injection    betamethasone acetate-betamethasone sodium phosphate (CELESTONE) injection 12 mg    Plan: No additional findings.   Meds & Orders:  Meds ordered this encounter  Medications  . betamethasone acetate-betamethasone sodium phosphate (CELESTONE) injection 12 mg    Orders Placed This Encounter  Procedures  . XR C-ARM NO REPORT  . Epidural Steroid injection    Follow-up: Return if symptoms worsen or fail to improve.   Procedures: No procedures performed  Lumbar Epidural Steroid Injection - Interlaminar Approach with Fluoroscopic Guidance  Patient: Oscar [name]      Date of Birth: 09/30/1932 MRN: [id number] PCP: Deland Pretty, MD      Visit Date: 08/24/2020   Universal Protocol:     Consent Given By: the patient  Position: PRONE  Additional Comments: Vital signs were monitored before and after the procedure. Patient was prepped and draped  in the usual sterile fashion. The correct patient, procedure, and site was verified.   Injection Procedure Details:   Procedure diagnoses: Lumbar radiculopathy [M54.16]   Meds Administered:  Meds ordered this encounter  Medications  . betamethasone acetate-betamethasone sodium phosphate (CELESTONE) injection 12 mg     Laterality: Right  Location/Site:  L5-S1  Needle: 3.5 in., 20 ga. Tuohy  Needle Placement: Paramedian epidural  Findings:   -Comments: Excellent flow of contrast into the epidural space.  Procedure Details: Using a paramedian approach from the side mentioned above, the region overlying the inferior lamina was localized under fluoroscopic visualization and the soft tissues overlying this structure were infiltrated with 4 ml. of 1% Lidocaine without Epinephrine. The Tuohy needle was inserted into the epidural space using a paramedian approach.   The epidural space was localized using loss of resistance along with counter oblique bi-planar fluoroscopic views.  After negative aspirate for air, blood, and CSF, a 2 ml. volume of Isovue-250 was injected into the epidural space and the flow of contrast was observed. Radiographs were obtained for documentation purposes.    The injectate was administered into the level noted above.   Additional Comments:  The patient tolerated the procedure well Dressing: 2 x 2 sterile gauze and Band-Aid    Post-procedure details: Patient was observed during the procedure. Post-procedure instructions were reviewed.  Patient left the clinic in stable condition.     Clinical History: CT Abdomen Musculoskeletal: Mild degenerative disc disease at L3-4, L4-5 and L5-S1. Severe facet degenerative changes at these levels. Broad-based disc extrusion at L4-5  with severe multifactorial spinal stenosis at this level. Osseous demineralization. No acute findings.  IMPRESSION: 1. Partial small bowel obstruction with an abrupt transition  to normal caliber bowel in the mid abdomen near the midline. This may be due to an adhesion or a focal stenosis within the small bowel. 2. Cholelithiasis without evidence of acute cholecystitis. 3. Descending and sigmoid colon diverticulosis without evidence of acute diverticulitis. 4. Moderate to marked prostate gland enlargement. 5. Broad-based disc extrusion at L4-5 with severe multifactorial spinal stenosis at this level. --- MRI LUMBAR SPINE WITHOUT CONTRAST  TECHNIQUE: Multiplanar, multisequence MR imaging of the lumbar spine was performed. No intravenous contrast was administered.  COMPARISON:  02/02/2013  FINDINGS: Segmentation:  Standard.  Alignment: Mild lumbar levoscoliosis. 6 mm anterolisthesis of L4 on L5, facet mediated and unchanged. Trace anterolisthesis of L5 on S1.  Vertebrae: Chronic mild diffuse bone marrow signal heterogeneity. No fracture or destructive osseous lesion. New small L3 inferior endplate Schmorl's node with minimal surrounding edema.  Conus medullaris: Extends to the T12 level and appears normal.  Paraspinal and other soft tissues: 1.5 cm T2 hyperintense lesion in the interpolar left kidney, most compatible with a cyst and slightly enlarged in the interim.  Disc levels:  Disc desiccation throughout the lumbar spine with relative exception of L3-4. Mild disc space narrowing at L4-5.  T11-12: Mild disc bulging results in mild bilateral neural foraminal stenosis without spinal stenosis.  T12-L1:  Mild facet arthrosis without stenosis.  L1-2: Mild disc bulging has slightly progressed and, along with mild-to-moderate facet hypertrophy results in borderline bilateral lateral recess stenosis. No spinal or neural foraminal stenosis.  L2-3: Mild circumferential disc bulging and moderate facet and ligamentum flavum hypertrophy result in borderline bilateral lateral recess and borderline bilateral neural foraminal stenosis,  overall stable to minimally progressed. No spinal stenosis.  L3-4: Disc bulging and moderate facet and ligamentum flavum hypertrophy result in moderate bilateral lateral recess stenosis and moderate right and mild left neural foraminal stenosis, slightly progressed from prior. Potential bilateral L4 nerve root impingement in the lateral recesses.  L4-5: Anterolisthesis with bulging uncovered disc and severe facet and ligamentum flavum hypertrophy result in severe spinal stenosis, severe bilateral lateral recess stenosis, and moderate bilateral neural foraminal stenosis, mildly progressed from prior. Bilateral L5 nerve root impingement in the lateral recesses.  L5-S1: Anterolisthesis with bulging uncovered disc, foraminal endplate spurring, ligamentum flavum thickening, and severe facet arthrosis result in mild right and mild-to-moderate left lateral recess stenosis and moderate left greater than right neural foraminal stenosis, overall stable to minimally progressed. No spinal stenosis.  IMPRESSION: 1. Severe multifactorial spinal stenosis at L4-5, mildly progressed from 2014. 2. Stable to minimally progressive disc and facet degeneration elsewhere as above.   Electronically Signed   By: Logan Bores M.D.   On: 02/06/2017 13:45  Aortic Atherosclerosis (ICD10-I70.0).   Electronically Signed By: Evangeline Dakin M.D. On: 05/04/2019 18:47     Objective:  VS:  HT:    WT:   BMI:     BP:(!) 174/98  HR:60bpm  TEMP: ( )  RESP:  Physical Exam Vitals and nursing note reviewed.  Constitutional:      General: He is not in acute distress.    Appearance: Normal appearance. He is not ill-appearing.  HENT:     Head: Normocephalic and atraumatic.     Right Ear: External ear normal.     Left Ear: External ear normal.     Nose: No congestion.  Eyes:  Extraocular Movements: Extraocular movements intact.  Cardiovascular:     Rate and Rhythm: Normal rate.      Pulses: Normal pulses.  Pulmonary:     Effort: Pulmonary effort is normal. No respiratory distress.  Abdominal:     General: There is no distension.     Palpations: Abdomen is soft.  Musculoskeletal:        General: No tenderness or signs of injury.     Cervical back: Neck supple.     Right lower leg: No edema.     Left lower leg: No edema.     Comments: Patient has good distal strength without clonus.  Skin:    Findings: No erythema or rash.  Neurological:     General: No focal deficit present.     Mental Status: He is alert and oriented to person, place, and time.     Sensory: No sensory deficit.     Motor: No weakness or abnormal muscle tone.     Coordination: Coordination normal.  Psychiatric:        Mood and Affect: Mood normal.        Behavior: Behavior normal.      Imaging: No results found.

## 2020-08-24 NOTE — Patient Instructions (Signed)

## 2020-08-24 NOTE — Progress Notes (Signed)
Pt state lower back pain mostly on his right. Pt state standing makes the pain worse. Pt state he takes pain meds to help ease his pain. Pt has hx of inj on 07/09/20 pt state It helped.  Numeric Pain Rating Scale and Functional Assessment Average Pain 8   In the last MONTH (on 0-10 scale) has pain interfered with the following?  1. General activity like being  able to carry out your everyday physical activities such as walking, climbing stairs, carrying groceries, or moving a chair?  Rating(10)   +Driver, -BT, -Dye Allergies.

## 2020-08-25 DIAGNOSIS — R8279 Other abnormal findings on microbiological examination of urine: Secondary | ICD-10-CM | POA: Diagnosis not present

## 2020-08-25 DIAGNOSIS — R3915 Urgency of urination: Secondary | ICD-10-CM | POA: Diagnosis not present

## 2020-08-26 ENCOUNTER — Ambulatory Visit: Payer: No Typology Code available for payment source | Admitting: Internal Medicine

## 2020-08-31 NOTE — Procedures (Signed)
Lumbar Epidural Steroid Injection - Interlaminar Approach with Fluoroscopic Guidance  Patient: Oscar [name]      Date of Birth: 11-25-1932 MRN: [id number] PCP: Deland Pretty, MD      Visit Date: 08/24/2020   Universal Protocol:     Consent Given By: the patient  Position: PRONE  Additional Comments: Vital signs were monitored before and after the procedure. Patient was prepped and draped in the usual sterile fashion. The correct patient, procedure, and site was verified.   Injection Procedure Details:   Procedure diagnoses: Lumbar radiculopathy [M54.16]   Meds Administered:  Meds ordered this encounter  Medications  . betamethasone acetate-betamethasone sodium phosphate (CELESTONE) injection 12 mg     Laterality: Right  Location/Site:  L5-S1  Needle: 3.5 in., 20 ga. Tuohy  Needle Placement: Paramedian epidural  Findings:   -Comments: Excellent flow of contrast into the epidural space.  Procedure Details: Using a paramedian approach from the side mentioned above, the region overlying the inferior lamina was localized under fluoroscopic visualization and the soft tissues overlying this structure were infiltrated with 4 ml. of 1% Lidocaine without Epinephrine. The Tuohy needle was inserted into the epidural space using a paramedian approach.   The epidural space was localized using loss of resistance along with counter oblique bi-planar fluoroscopic views.  After negative aspirate for air, blood, and CSF, a 2 ml. volume of Isovue-250 was injected into the epidural space and the flow of contrast was observed. Radiographs were obtained for documentation purposes.    The injectate was administered into the level noted above.   Additional Comments:  The patient tolerated the procedure well Dressing: 2 x 2 sterile gauze and Band-Aid    Post-procedure details: Patient was observed during the procedure. Post-procedure instructions were reviewed.  Patient left the  clinic in stable condition.

## 2020-09-01 ENCOUNTER — Telehealth: Payer: Self-pay | Admitting: Family Medicine

## 2020-09-01 MED ORDER — HYDROCODONE-ACETAMINOPHEN 2.5-108 MG/5ML PO SOLN: 5.0000 mL | Freq: Every evening | ORAL | 0 refills | Status: DC | PRN

## 2020-09-01 NOTE — Telephone Encounter (Signed)
I called and advised the patient. 

## 2020-09-01 NOTE — Telephone Encounter (Signed)
Rx sent 

## 2020-09-01 NOTE — Telephone Encounter (Signed)
Please advise 

## 2020-09-01 NOTE — Addendum Note (Signed)
Addended by: Hortencia Pilar on: 09/01/2020 04:18 PM   Modules accepted: Orders

## 2020-09-01 NOTE — Telephone Encounter (Signed)
Pt would like a refill on hydrocodone.   

## 2020-09-29 DIAGNOSIS — I10 Essential (primary) hypertension: Secondary | ICD-10-CM | POA: Diagnosis not present

## 2020-10-08 DIAGNOSIS — I10 Essential (primary) hypertension: Secondary | ICD-10-CM | POA: Diagnosis not present

## 2020-10-22 DIAGNOSIS — I10 Essential (primary) hypertension: Secondary | ICD-10-CM | POA: Diagnosis not present

## 2020-10-27 ENCOUNTER — Telehealth: Payer: Self-pay | Admitting: Physical Medicine and Rehabilitation

## 2020-10-27 ENCOUNTER — Other Ambulatory Visit: Payer: Self-pay | Admitting: Family Medicine

## 2020-10-27 ENCOUNTER — Telehealth: Payer: Self-pay | Admitting: Family Medicine

## 2020-10-27 DIAGNOSIS — R0981 Nasal congestion: Secondary | ICD-10-CM | POA: Diagnosis not present

## 2020-10-27 DIAGNOSIS — R059 Cough, unspecified: Secondary | ICD-10-CM | POA: Diagnosis not present

## 2020-10-27 DIAGNOSIS — J302 Other seasonal allergic rhinitis: Secondary | ICD-10-CM | POA: Diagnosis not present

## 2020-10-27 DIAGNOSIS — H938X3 Other specified disorders of ear, bilateral: Secondary | ICD-10-CM | POA: Diagnosis not present

## 2020-10-27 MED ORDER — HYDROCODONE-ACETAMINOPHEN 2.5-108 MG/5ML PO SOLN: 5.0000 mL | Freq: Every evening | ORAL | 0 refills | Status: DC | PRN

## 2020-10-27 NOTE — Telephone Encounter (Signed)
Pt called requesting for repeat. Pt state he having left hip pain and no injurys. Pt last inj was 07/09/20 Left L4-L5 TF, Please advise

## 2020-10-27 NOTE — Telephone Encounter (Signed)
Pt wanting to get sched to receive another inj with Dr. Ernestina Patches. The best call back number is [phone] per pt.

## 2020-10-27 NOTE — Telephone Encounter (Signed)
Pt called asking for a refill on his prescription of hydrocodone. The best call back number [phone] if needed.

## 2020-10-27 NOTE — Telephone Encounter (Signed)
Please advise 

## 2020-10-28 ENCOUNTER — Telehealth: Payer: Self-pay

## 2020-10-28 NOTE — Telephone Encounter (Signed)
Oscar [name] with [facility] called stating that Rx for Hydrocodone needs to be changed and that he is not able to use Rx for 2.5.  Will need a new Rx sent?  CB# [phone].  Please advise.  Thank you

## 2020-10-28 NOTE — Telephone Encounter (Signed)
That strength is not listed in the Epic system, so I can't do that.

## 2020-10-28 NOTE — Telephone Encounter (Signed)
I faxed back the paper to the pharmacy where you had written: ok to give 7.5 mg/15 mL, with instructions to take 5 mL po qhs prn #150 mL/0 RF. However, they need this sent in a new Rx. [facility].

## 2020-10-29 ENCOUNTER — Telehealth: Payer: Self-pay | Admitting: Family Medicine

## 2020-10-29 NOTE — Telephone Encounter (Signed)
I refaxed the response of med change to [facility], with an additional note stating that it cannot be sent electronically through the Epic system.

## 2020-10-29 NOTE — Telephone Encounter (Signed)
Patient called advised the pharmacy told him they will need a new Rx sent over to them concerning the Hydrocodone. Patient said the pharmacy will not except the letter sent to them. Patient said the pharmacy did not have Hydrocodone 5 mg but, they do have Hydrocodone 7 mg. The number to contact patient is [phone]

## 2020-10-29 NOTE — Telephone Encounter (Signed)
I called the patient and advised him of that this would have to wait until Monday, when Dr. Junius Roads is in the clinic again. We will have to fax a new written script then, as we cannot send it through our system electronically.

## 2020-11-02 ENCOUNTER — Telehealth: Payer: Self-pay | Admitting: Family Medicine

## 2020-11-02 NOTE — Telephone Encounter (Signed)
I called and advised that the rx was written and I would put it at the front desk for pick up.

## 2020-11-02 NOTE — Telephone Encounter (Signed)
Received call from [facility] needing clarification on Rx Hydrocodone Syrup. The number to contact [facility] is [phone]

## 2020-11-02 NOTE — Telephone Encounter (Signed)
Please see below and advise.

## 2020-11-03 ENCOUNTER — Other Ambulatory Visit: Payer: Self-pay | Admitting: Radiology

## 2020-11-03 NOTE — Telephone Encounter (Signed)
I called and spoke with the pharmacy, they are stating that the rx only says hydrocodone syrup and it needs to say Hydrocodone-Acetaminophen with 7.5-325mg  syrupon it. She states that she already voided the other rx that was dropped off.

## 2020-11-03 NOTE — Telephone Encounter (Signed)
I tried to call but there was no answer and no VM I will try to call again in the morning.  I have put an rx downstairs for him to pick up

## 2020-11-04 ENCOUNTER — Telehealth: Payer: Self-pay | Admitting: Family Medicine

## 2020-11-04 NOTE — Telephone Encounter (Signed)
noted 

## 2020-11-04 NOTE — Telephone Encounter (Signed)
Patient called back and states pharmacy received electronically. Disregard message.

## 2020-11-04 NOTE — Telephone Encounter (Signed)
Patient called back stating he spoke with the pharmacy and they received the electronically. Patient said to disregard his previous message.    Please see previous message

## 2020-11-04 NOTE — Telephone Encounter (Signed)
I called and advised that his rx was at the front desk and he could pick it up at any time.

## 2020-11-04 NOTE — Telephone Encounter (Signed)
Patient called advised the pharmacy said they can not take a paper Rx (Hydrocodone). The Rx will need to be sent over electronically.  The number  to contact patient is [phone]

## 2020-11-17 ENCOUNTER — Ambulatory Visit: Payer: Self-pay

## 2020-11-17 ENCOUNTER — Encounter: Payer: Self-pay | Admitting: Physical Medicine and Rehabilitation

## 2020-11-17 ENCOUNTER — Ambulatory Visit (INDEPENDENT_AMBULATORY_CARE_PROVIDER_SITE_OTHER): Payer: Medicare Other | Admitting: Physical Medicine and Rehabilitation

## 2020-11-17 ENCOUNTER — Other Ambulatory Visit: Payer: Self-pay

## 2020-11-17 VITALS — BP 128/72 | HR 71

## 2020-11-17 DIAGNOSIS — M48062 Spinal stenosis, lumbar region with neurogenic claudication: Secondary | ICD-10-CM | POA: Diagnosis not present

## 2020-11-17 DIAGNOSIS — M5416 Radiculopathy, lumbar region: Secondary | ICD-10-CM

## 2020-11-17 DIAGNOSIS — I10 Essential (primary) hypertension: Secondary | ICD-10-CM | POA: Diagnosis not present

## 2020-11-17 MED ORDER — BETAMETHASONE SOD PHOS & ACET 6 (3-3) MG/ML IJ SUSP
12.0000 mg | Freq: Once | INTRAMUSCULAR | Status: AC
  Administered 2020-11-17: 12 mg

## 2020-11-17 NOTE — Patient Instructions (Signed)

## 2020-11-17 NOTE — Progress Notes (Signed)
Pt state lower back pain that travels to his left hip. Pt state standing makes the pain worse. Pt state he take pain meds to help ease his pain. Pt has hx of inj on 08/24/20 pt state it helped.  Numeric Pain Rating Scale and Functional Assessment Average Pain 7   In the last MONTH (on 0-10 scale) has pain interfered with the following?  1. General activity like being  able to carry out your everyday physical activities such as walking, climbing stairs, carrying groceries, or moving a chair?  Rating(8)   +Driver, -BT, -Dye Allergies.

## 2020-11-19 NOTE — Procedures (Signed)
Lumbar Epidural Steroid Injection - Interlaminar Approach with Fluoroscopic Guidance  Patient: Oscar [name]      Date of Birth: Nov 05, 1932 MRN: [id number] PCP: Deland Pretty, MD      Visit Date: 11/17/2020   Universal Protocol:     Consent Given By: the patient  Position: PRONE  Additional Comments: Vital signs were monitored before and after the procedure. Patient was prepped and draped in the usual sterile fashion. The correct patient, procedure, and site was verified.   Injection Procedure Details:   Procedure diagnoses: Lumbar radiculopathy [M54.16]   Meds Administered:  Meds ordered this encounter  Medications   betamethasone acetate-betamethasone sodium phosphate (CELESTONE) injection 12 mg     Laterality: Left  Location/Site:  L5-S1  Needle: 3.5 in., 20 ga. Tuohy  Needle Placement: Paramedian epidural  Findings:   -Comments: Excellent flow of contrast into the epidural space.  Procedure Details: Using a paramedian approach from the side mentioned above, the region overlying the inferior lamina was localized under fluoroscopic visualization and the soft tissues overlying this structure were infiltrated with 4 ml. of 1% Lidocaine without Epinephrine. The Tuohy needle was inserted into the epidural space using a paramedian approach.   The epidural space was localized using loss of resistance along with counter oblique bi-planar fluoroscopic views.  After negative aspirate for air, blood, and CSF, a 2 ml. volume of Isovue-250 was injected into the epidural space and the flow of contrast was observed. Radiographs were obtained for documentation purposes.    The injectate was administered into the level noted above.   Additional Comments:  No complications occurred Dressing: 2 x 2 sterile gauze and Band-Aid    Post-procedure details: Patient was observed during the procedure. Post-procedure instructions were reviewed.  Patient left the clinic in stable  condition.

## 2020-11-19 NOTE — Progress Notes (Signed)
Oscar [name] - 87 y.o. male MRN [id number]  Date of birth: [birth year]  Office Visit Note: Visit Date: 11/17/2020 PCP: Deland Pretty, MD Referred by: Deland Pretty, MD  Subjective: Chief Complaint  Patient presents with   Lower Back - Pain   Left Hip - Pain   HPI:  Oscar [name] is a 87 y.o. male who comes in today for planned repeat Left L5-S1  Lumbar Interlaminar epidural steroid injection with fluoroscopic guidance.  The patient has failed conservative care including home exercise, medications, time and activity modification.  This injection will be diagnostic and hopefully therapeutic.  Please see requesting physician notes for further details and justification. Patient received more than 50% pain relief from prior injection.  Brief history is that the patient has severe multifactorial stenosis at L4-5 and lateral recess stenosis at L3-4 and L5-S1.  He has been followed by Dr. Eunice Blase as well as his primary care physician Dr. Shelia Media.  Initial epidural injections were transforaminal injections at L4 at the level of greatest stenosis and those were diagnostically helpful but did not last very long.  The last injection performed was in April and he did quite well and interlaminar injection below the level of stenosis and that helped him more.  We are going to repeat that today.  The patient does not really like injections and would really like what he refers to his cortisone pills that he can take about 3 days a week that helps him do things better.  He seems to be irritated that his primary care physician will provide that.  I tried to have a discussion with him concerning steroid type medications taken on the long-term.  Of course at his age it is probably not an issue if done at a very low dose.  Again he needs to take that up with his primary care physician.  Dr. Junius Roads has maintained him on some liquid hydrocodone to try to help as well.  Hopefully the injection today will get you in  with few months of relief of his hip pain.  Referring: Dr. Legrand Como Hilts   ROS Otherwise per HPI.  Assessment & Plan: Visit Diagnoses:    ICD-10-CM   1. Lumbar radiculopathy  M54.16 XR C-ARM NO REPORT    Epidural Steroid injection    betamethasone acetate-betamethasone sodium phosphate (CELESTONE) injection 12 mg    2. Spinal stenosis of lumbar region with neurogenic claudication  M48.062 XR C-ARM NO REPORT    Epidural Steroid injection    betamethasone acetate-betamethasone sodium phosphate (CELESTONE) injection 12 mg      Plan: No additional findings.   Meds & Orders:  Meds ordered this encounter  Medications   betamethasone acetate-betamethasone sodium phosphate (CELESTONE) injection 12 mg    Orders Placed This Encounter  Procedures   XR C-ARM NO REPORT   Epidural Steroid injection    Follow-up: Return if symptoms worsen or fail to improve.   Procedures: No procedures performed  Lumbar Epidural Steroid Injection - Interlaminar Approach with Fluoroscopic Guidance  Patient: Oscar [name]      Date of Birth: 28-Oct-1932 MRN: [id number] PCP: Deland Pretty, MD      Visit Date: 11/17/2020   Universal Protocol:     Consent Given By: the patient  Position: PRONE  Additional Comments: Vital signs were monitored before and after the procedure. Patient was prepped and draped in the usual sterile fashion. The correct patient, procedure, and site was verified.   Injection Procedure Details:  Procedure diagnoses: Lumbar radiculopathy [M54.16]   Meds Administered:  Meds ordered this encounter  Medications   betamethasone acetate-betamethasone sodium phosphate (CELESTONE) injection 12 mg     Laterality: Left  Location/Site:  L5-S1  Needle: 3.5 in., 20 ga. Tuohy  Needle Placement: Paramedian epidural  Findings:   -Comments: Excellent flow of contrast into the epidural space.  Procedure Details: Using a paramedian approach from the side mentioned above,  the region overlying the inferior lamina was localized under fluoroscopic visualization and the soft tissues overlying this structure were infiltrated with 4 ml. of 1% Lidocaine without Epinephrine. The Tuohy needle was inserted into the epidural space using a paramedian approach.   The epidural space was localized using loss of resistance along with counter oblique bi-planar fluoroscopic views.  After negative aspirate for air, blood, and CSF, a 2 ml. volume of Isovue-250 was injected into the epidural space and the flow of contrast was observed. Radiographs were obtained for documentation purposes.    The injectate was administered into the level noted above.   Additional Comments:  No complications occurred Dressing: 2 x 2 sterile gauze and Band-Aid    Post-procedure details: Patient was observed during the procedure. Post-procedure instructions were reviewed.  Patient left the clinic in stable condition.   Clinical History: CT Abdomen Musculoskeletal: Mild degenerative disc disease at L3-4, L4-5 and L5-S1. Severe facet degenerative changes at these levels. Broad-based disc extrusion at L4-5 with severe multifactorial spinal stenosis at this level. Osseous demineralization. No acute findings.   IMPRESSION: 1. Partial small bowel obstruction with an abrupt transition to normal caliber bowel in the mid abdomen near the midline. This may be due to an adhesion or a focal stenosis within the small bowel. 2. Cholelithiasis without evidence of acute cholecystitis. 3. Descending and sigmoid colon diverticulosis without evidence of acute diverticulitis. 4. Moderate to marked prostate gland enlargement. 5. Broad-based disc extrusion at L4-5 with severe multifactorial spinal stenosis at this level. --- MRI LUMBAR SPINE WITHOUT CONTRAST   TECHNIQUE: Multiplanar, multisequence MR imaging of the lumbar spine was performed. No intravenous contrast was administered.   COMPARISON:   02/02/2013   FINDINGS: Segmentation:  Standard.   Alignment: Mild lumbar levoscoliosis. 6 mm anterolisthesis of L4 on L5, facet mediated and unchanged. Trace anterolisthesis of L5 on S1.   Vertebrae: Chronic mild diffuse bone marrow signal heterogeneity. No fracture or destructive osseous lesion. New small L3 inferior endplate Schmorl's node with minimal surrounding edema.   Conus medullaris: Extends to the T12 level and appears normal.   Paraspinal and other soft tissues: 1.5 cm T2 hyperintense lesion in the interpolar left kidney, most compatible with a cyst and slightly enlarged in the interim.   Disc levels:   Disc desiccation throughout the lumbar spine with relative exception of L3-4. Mild disc space narrowing at L4-5.   T11-12: Mild disc bulging results in mild bilateral neural foraminal stenosis without spinal stenosis.   T12-L1:  Mild facet arthrosis without stenosis.   L1-2: Mild disc bulging has slightly progressed and, along with mild-to-moderate facet hypertrophy results in borderline bilateral lateral recess stenosis. No spinal or neural foraminal stenosis.   L2-3: Mild circumferential disc bulging and moderate facet and ligamentum flavum hypertrophy result in borderline bilateral lateral recess and borderline bilateral neural foraminal stenosis, overall stable to minimally progressed. No spinal stenosis.   L3-4: Disc bulging and moderate facet and ligamentum flavum hypertrophy result in moderate bilateral lateral recess stenosis and moderate right and mild left neural foraminal stenosis,  slightly progressed from prior. Potential bilateral L4 nerve root impingement in the lateral recesses.   L4-5: Anterolisthesis with bulging uncovered disc and severe facet and ligamentum flavum hypertrophy result in severe spinal stenosis, severe bilateral lateral recess stenosis, and moderate bilateral neural foraminal stenosis, mildly progressed from prior. Bilateral L5  nerve root impingement in the lateral recesses.   L5-S1: Anterolisthesis with bulging uncovered disc, foraminal endplate spurring, ligamentum flavum thickening, and severe facet arthrosis result in mild right and mild-to-moderate left lateral recess stenosis and moderate left greater than right neural foraminal stenosis, overall stable to minimally progressed. No spinal stenosis.   IMPRESSION: 1. Severe multifactorial spinal stenosis at L4-5, mildly progressed from 2014. 2. Stable to minimally progressive disc and facet degeneration elsewhere as above.     Electronically Signed   By: Logan Bores M.D.   On: 02/06/2017 13:45   Aortic Atherosclerosis (ICD10-I70.0).     Electronically Signed By: Evangeline Dakin M.D. On: 05/04/2019 18:47     Objective:  VS:  HT:    WT:   BMI:     BP:128/72  HR:71bpm  TEMP: ( )  RESP:  Physical Exam Vitals and nursing note reviewed.  Constitutional:      General: He is not in acute distress.    Appearance: Normal appearance. He is not ill-appearing.     Comments: Thin appearing  HENT:     Head: Normocephalic and atraumatic.     Right Ear: External ear normal.     Left Ear: External ear normal.     Nose: No congestion.  Eyes:     Extraocular Movements: Extraocular movements intact.  Cardiovascular:     Rate and Rhythm: Normal rate.     Pulses: Normal pulses.  Pulmonary:     Effort: Pulmonary effort is normal. No respiratory distress.  Abdominal:     General: There is no distension.     Palpations: Abdomen is soft.  Musculoskeletal:        General: No tenderness or signs of injury.     Cervical back: Neck supple.     Right lower leg: No edema.     Left lower leg: No edema.     Comments: Patient has good distal strength without clonus.  He is very slow to arise from a seated position.  He is somewhat unsteady but does not have any foot drop.  He does ambulate with a cane.  Skin:    Findings: No erythema or rash.  Neurological:      General: No focal deficit present.     Mental Status: He is alert and oriented to person, place, and time.     Sensory: No sensory deficit.     Motor: No weakness or abnormal muscle tone.     Coordination: Coordination normal.     Gait: Gait abnormal.  Psychiatric:        Mood and Affect: Mood normal.        Behavior: Behavior normal.     Imaging: No results found.

## 2020-12-01 ENCOUNTER — Telehealth: Payer: Self-pay | Admitting: Internal Medicine

## 2020-12-01 NOTE — Telephone Encounter (Signed)
Plz disregard scheduling note.[name] KitchenMarland KitchenPt wants to speak with nurse..  Patient calling with constipation/Abd discomfort. Pt wants to know how to treat sxs.. Plz advise thank you

## 2020-12-01 NOTE — Telephone Encounter (Signed)
Oscar [name] not quite sure what this message means. Please advise.

## 2020-12-01 NOTE — Telephone Encounter (Signed)
Pt states he has been having some nausea and stomach discomfort. Reports his stomach feels "weak". Pt requests an appt with Dr. Henrene Pastor . Pt scheduled to see Dr. Henrene Pastor 12/21/20 at 2pm. Pt aware of appt.

## 2020-12-21 ENCOUNTER — Ambulatory Visit (INDEPENDENT_AMBULATORY_CARE_PROVIDER_SITE_OTHER): Payer: Medicare Other | Admitting: Internal Medicine

## 2020-12-21 ENCOUNTER — Encounter: Payer: Self-pay | Admitting: Internal Medicine

## 2020-12-21 VITALS — BP 130/68 | HR 56 | Ht 73.5 in | Wt 165.8 lb

## 2020-12-21 DIAGNOSIS — K219 Gastro-esophageal reflux disease without esophagitis: Secondary | ICD-10-CM | POA: Diagnosis not present

## 2020-12-21 DIAGNOSIS — K089 Disorder of teeth and supporting structures, unspecified: Secondary | ICD-10-CM | POA: Diagnosis not present

## 2020-12-21 DIAGNOSIS — K5900 Constipation, unspecified: Secondary | ICD-10-CM

## 2020-12-21 DIAGNOSIS — Z8501 Personal history of malignant neoplasm of esophagus: Secondary | ICD-10-CM | POA: Diagnosis not present

## 2020-12-21 DIAGNOSIS — K56609 Unspecified intestinal obstruction, unspecified as to partial versus complete obstruction: Secondary | ICD-10-CM

## 2020-12-21 DIAGNOSIS — R109 Unspecified abdominal pain: Secondary | ICD-10-CM

## 2020-12-21 NOTE — Patient Instructions (Signed)
If you are age 85 or older, your body mass index should be between 23-30. Your Body mass index is 21.58 kg/m. If this is out of the aforementioned range listed, please consider follow up with your Primary Care Provider.  If you are age 34 or younger, your body mass index should be between 19-25. Your Body mass index is 21.58 kg/m. If this is out of the aformentioned range listed, please consider follow up with your Primary Care Provider.   __________________________________________________________  The [facility] providers would like to encourage you to use [facility] to communicate with providers for non-urgent requests or questions.  Due to long hold times on the telephone, sending your provider a message by [facility] may be a faster and more efficient way to get a response.  Please allow 48 business hours for a response.  Please remember that this is for non-urgent requests.   Please follow up as needed

## 2020-12-21 NOTE — Progress Notes (Signed)
HISTORY OF PRESENT ILLNESS:  Oscar [name] is a 87 y.o. male with multiple medical problems who presents himself today for "checkup".  He was last seen in this office August 21, 2019.  His GI history is remarkable for small bowel obstruction secondary to adhesions, remote history of esophageal cancer status post esophagectomy, small tongue of residual columnar mucosa possibly Barrett's, history of anastomotic stricturing, constipation, and known cholelithiasis.  Patient tells me that he was having terrible difficulties with his teeth.  These were pulled last week.  Prior to that he had such difficulty with eating that resulted in weight loss.  He also tells me that for several months he had problems with postprandial abdominal discomfort in the lower abdomen lasting 2 or 3 minutes.  No nausea or vomiting.  No dysphagia.  No reflux symptoms.  He continues on omeprazole daily.  He continues with MiraLAX for constipation.  This helps.  No bleeding.  He was evaluated for abdominal pain in August 2021.  He was found to have recurrent small bowel obstruction.  He was admitted to the hospital and underwent laparoscopic lysis of adhesions on December 27, 2019.  Overall 4-day hospital stay.  His last upper endoscopy was 2015.  REVIEW OF SYSTEMS:  All non-GI ROS negative unless otherwise stated in the HPI except for mouth pain  Past Medical History:  Diagnosis Date   Acid reflux    Barrett's esophagus    BPH (benign prostatic hyperplasia)    Colon polyps    DDD (degenerative disc disease)    Diabetes mellitus    controlled with diet and exercise   ED (erectile dysfunction)    Esophageal cancer ([place]) 2001   tx'd surgery - abdominal & right posterior chest approach (2001) - [place], [place]   First degree AV block    Gout    Hemorrhoids    History of palpitations    Hyperlipidemia    Hypertension    Lipoma    Obesity    Osteoarthritis    Small bowel obstruction ([facility])    Tinea versicolor    Tinnitus      Past Surgical History:  Procedure Laterality Date   bilateral olecranon bursal excisions     ESOPHAGECTOMY  2001   Cutter Thoraicic/Abd approach.  High Grade Dysplasia w esophageal CA   HEMORRHOID SURGERY     LAPAROSCOPY N/A 12/27/2019   Procedure: LAPAROSCOPY DIAGNOSTI; LYSIS OF ADHESIONS;  Surgeon: Michael Boston, MD;  Location: [facility];  Service: General;  Laterality: N/A;   NM MYOCAR PERF WALL MOTION  2011   dipyridamole myoview - stress images show medium in size, moderate in intensity perfusion defect in basal inferior & mid inferior walls with mild defect reversibility at rest, EF 64%   TRANSTHORACIC ECHOCARDIOGRAM  2011   EF=>55%, mild mitral annular calcif, mild calcif of MV apparatus, mild TR, normal RSVP, mild AV regurg, aortic root sclerosis/calcifiication   VASECTOMY      Social History Oscar [name]  reports that he has been smoking cigars and pipe. He has never used smokeless tobacco. He reports current alcohol use of about 7.0 - 14.0 standard drinks per week. He reports that he does not use drugs.  family history includes CAD in his mother; Diabetes in his mother; Stroke in his sister.  Allergies  Allergen Reactions   Bee Venom Anaphylaxis   Flomax [Tamsulosin] Other (See Comments)    "Makes me feel weird"   Tadalafil  Other reaction(s): Nasal congestion       PHYSICAL EXAMINATION: Vital signs: BP 130/68   Pulse (!) 56   Ht 6' 1.5" (1.867 m)   Wt 165 lb 12.8 oz (75.2 kg)   BMI 21.58 kg/m   Constitutional: Thin but generally well-appearing, no acute distress Psychiatric: alert and oriented x3, cooperative Eyes: extraocular movements intact, anicteric, conjunctiva pink Mouth: oral pharynx moist, no lesions.  Evident that teeth recently pulled Neck: supple no lymphadenopathy Cardiovascular: heart regular rate and rhythm, no murmur Lungs: clear to auscultation bilaterally Abdomen: soft, nontender, nondistended, no obvious ascites, no  peritoneal signs, normal bowel sounds, no organomegaly Rectal: Omitted Extremities: no clubbing, cyanosis, or lower extremity edema bilaterally Skin: no lesions on visible extremities Neuro: No focal deficits.  Cranial nerves intact  ASSESSMENT:  1.  Recurrent small bowel obstruction. 2.  Functional constipation 3.  GERD 4.  History of esophageal cancer remotely status postresection 5.  History of anastomotic stricture.  Currently asymptomatic on PPI 6.  Intermittent short-lived postprandial lower abdominal pain.  Resolved 7.  Cholelithiasis.  Still felt to be incidental  PLAN:  1.  Continue omeprazole 2.  Continue MiraLAX for constipation 3.  No indication for endoscopic evaluations 4.  Resume general medical care with PCP.  GI follow-up as needed

## 2021-01-07 DIAGNOSIS — M5136 Other intervertebral disc degeneration, lumbar region: Secondary | ICD-10-CM | POA: Diagnosis not present

## 2021-01-07 DIAGNOSIS — M9905 Segmental and somatic dysfunction of pelvic region: Secondary | ICD-10-CM | POA: Diagnosis not present

## 2021-01-07 DIAGNOSIS — M9903 Segmental and somatic dysfunction of lumbar region: Secondary | ICD-10-CM | POA: Diagnosis not present

## 2021-01-07 DIAGNOSIS — M9904 Segmental and somatic dysfunction of sacral region: Secondary | ICD-10-CM | POA: Diagnosis not present

## 2021-02-09 DIAGNOSIS — I440 Atrioventricular block, first degree: Secondary | ICD-10-CM | POA: Diagnosis not present

## 2021-02-09 DIAGNOSIS — I10 Essential (primary) hypertension: Secondary | ICD-10-CM | POA: Diagnosis not present

## 2021-02-09 DIAGNOSIS — E118 Type 2 diabetes mellitus with unspecified complications: Secondary | ICD-10-CM | POA: Diagnosis not present

## 2021-02-09 DIAGNOSIS — Z01818 Encounter for other preprocedural examination: Secondary | ICD-10-CM | POA: Diagnosis not present

## 2021-03-31 DIAGNOSIS — E119 Type 2 diabetes mellitus without complications: Secondary | ICD-10-CM | POA: Diagnosis not present

## 2021-03-31 DIAGNOSIS — I10 Essential (primary) hypertension: Secondary | ICD-10-CM | POA: Diagnosis not present

## 2021-04-10 ENCOUNTER — Inpatient Hospital Stay (HOSPITAL_COMMUNITY)
Admission: EM | Admit: 2021-04-10 | Discharge: 2021-04-12 | DRG: 872 | Disposition: A | Discharge status: Home / Self Care | Payer: No Typology Code available for payment source | Attending: Internal Medicine | Admitting: Internal Medicine

## 2021-04-10 ENCOUNTER — Emergency Department (HOSPITAL_COMMUNITY): Payer: No Typology Code available for payment source

## 2021-04-10 ENCOUNTER — Other Ambulatory Visit: Payer: Self-pay

## 2021-04-10 DIAGNOSIS — Z8249 Family history of ischemic heart disease and other diseases of the circulatory system: Secondary | ICD-10-CM

## 2021-04-10 DIAGNOSIS — E785 Hyperlipidemia, unspecified: Secondary | ICD-10-CM | POA: Diagnosis present

## 2021-04-10 DIAGNOSIS — K2270 Barrett's esophagus without dysplasia: Secondary | ICD-10-CM | POA: Diagnosis present

## 2021-04-10 DIAGNOSIS — N400 Enlarged prostate without lower urinary tract symptoms: Secondary | ICD-10-CM | POA: Diagnosis present

## 2021-04-10 DIAGNOSIS — M109 Gout, unspecified: Secondary | ICD-10-CM | POA: Diagnosis present

## 2021-04-10 DIAGNOSIS — Z833 Family history of diabetes mellitus: Secondary | ICD-10-CM

## 2021-04-10 DIAGNOSIS — Z8501 Personal history of malignant neoplasm of esophagus: Secondary | ICD-10-CM

## 2021-04-10 DIAGNOSIS — Z79899 Other long term (current) drug therapy: Secondary | ICD-10-CM

## 2021-04-10 DIAGNOSIS — Z823 Family history of stroke: Secondary | ICD-10-CM

## 2021-04-10 DIAGNOSIS — K219 Gastro-esophageal reflux disease without esophagitis: Secondary | ICD-10-CM | POA: Diagnosis present

## 2021-04-10 DIAGNOSIS — J101 Influenza due to other identified influenza virus with other respiratory manifestations: Secondary | ICD-10-CM | POA: Diagnosis not present

## 2021-04-10 DIAGNOSIS — Z91030 Bee allergy status: Secondary | ICD-10-CM

## 2021-04-10 DIAGNOSIS — Z8719 Personal history of other diseases of the digestive system: Secondary | ICD-10-CM

## 2021-04-10 DIAGNOSIS — Z888 Allergy status to other drugs, medicaments and biological substances status: Secondary | ICD-10-CM

## 2021-04-10 DIAGNOSIS — R0902 Hypoxemia: Secondary | ICD-10-CM | POA: Diagnosis present

## 2021-04-10 DIAGNOSIS — A4189 Other specified sepsis: Principal | ICD-10-CM | POA: Diagnosis present

## 2021-04-10 DIAGNOSIS — A419 Sepsis, unspecified organism: Secondary | ICD-10-CM | POA: Diagnosis present

## 2021-04-10 DIAGNOSIS — R531 Weakness: Secondary | ICD-10-CM | POA: Diagnosis present

## 2021-04-10 DIAGNOSIS — F17290 Nicotine dependence, other tobacco product, uncomplicated: Secondary | ICD-10-CM | POA: Diagnosis present

## 2021-04-10 DIAGNOSIS — I10 Essential (primary) hypertension: Secondary | ICD-10-CM | POA: Diagnosis present

## 2021-04-10 DIAGNOSIS — I440 Atrioventricular block, first degree: Secondary | ICD-10-CM | POA: Diagnosis present

## 2021-04-10 DIAGNOSIS — Z20822 Contact with and (suspected) exposure to covid-19: Secondary | ICD-10-CM | POA: Diagnosis present

## 2021-04-10 DIAGNOSIS — F1290 Cannabis use, unspecified, uncomplicated: Secondary | ICD-10-CM | POA: Diagnosis present

## 2021-04-10 DIAGNOSIS — E119 Type 2 diabetes mellitus without complications: Secondary | ICD-10-CM | POA: Diagnosis present

## 2021-04-10 DIAGNOSIS — K5909 Other constipation: Secondary | ICD-10-CM | POA: Diagnosis present

## 2021-04-10 DIAGNOSIS — S2231XA Fracture of one rib, right side, initial encounter for closed fracture: Secondary | ICD-10-CM | POA: Diagnosis not present

## 2021-04-10 LAB — PROTIME-INR
INR: 1.1 (ref 0.8–1.2)
Prothrombin Time: 13.9 seconds (ref 11.4–15.2)

## 2021-04-10 LAB — COMPREHENSIVE METABOLIC PANEL
ALT: 12 U/L (ref 0–44)
AST: 22 U/L (ref 15–41)
Albumin: 3.9 g/dL (ref 3.5–5.0)
Alkaline Phosphatase: 60 U/L (ref 38–126)
Anion gap: 9 (ref 5–15)
BUN: 14 mg/dL (ref 8–23)
CO2: 27 mmol/L (ref 22–32)
Calcium: 8.6 mg/dL — ABNORMAL LOW (ref 8.9–10.3)
Chloride: 94 mmol/L — ABNORMAL LOW (ref 98–111)
Creatinine, Ser: 1.01 mg/dL (ref 0.61–1.24)
GFR, Estimated: >60 mL/min (ref >60)
Glucose, Bld: 101 mg/dL — ABNORMAL HIGH (ref 70–99)
Potassium: 3.9 mmol/L (ref 3.5–5.1)
Sodium: 130 mmol/L — ABNORMAL LOW (ref 135–145)
Total Bilirubin: 1.1 mg/dL (ref 0.3–1.2)
Total Protein: 6.6 g/dL (ref 6.5–8.1)

## 2021-04-10 LAB — CBC WITH DIFFERENTIAL/PLATELET
Abs Immature Granulocytes: 0.04 10*3/uL (ref 0.00–0.07)
Basophils Absolute: 0 10*3/uL (ref 0.0–0.1)
Basophils Relative: 0 %
Eosinophils Absolute: 0 10*3/uL (ref 0.0–0.5)
Eosinophils Relative: 0 %
HCT: 38.3 % — ABNORMAL LOW (ref 39.0–52.0)
Hemoglobin: 12.9 g/dL — ABNORMAL LOW (ref 13.0–17.0)
Immature Granulocytes: 1 %
Lymphocytes Relative: 4 %
Lymphs Abs: 0.4 10*3/uL — ABNORMAL LOW (ref 0.7–4.0)
MCH: 32.7 pg (ref 26.0–34.0)
MCHC: 33.7 g/dL (ref 30.0–36.0)
MCV: 97 fL (ref 80.0–100.0)
Monocytes Absolute: 0.4 10*3/uL (ref 0.1–1.0)
Monocytes Relative: 5 %
Neutro Abs: 8 10*3/uL — ABNORMAL HIGH (ref 1.7–7.7)
Neutrophils Relative %: 90 %
Platelets: 194 10*3/uL (ref 150–400)
RBC: 3.95 MIL/uL — ABNORMAL LOW (ref 4.22–5.81)
RDW: 11.9 % (ref 11.5–15.5)
WBC: 8.8 10*3/uL (ref 4.0–10.5)
nRBC: 0 % (ref 0.0–0.2)

## 2021-04-10 LAB — LACTIC ACID, PLASMA: Lactic Acid, Venous: 1.2 mmol/L (ref 0.5–1.9)

## 2021-04-10 MED ORDER — SODIUM CHLORIDE 0.9 % IV SOLN
500.0000 mg | Freq: Once | INTRAVENOUS | Status: AC
  Administered 2021-04-11: 500 mg via INTRAVENOUS
  Filled 2021-04-10: qty 500

## 2021-04-10 MED ORDER — SODIUM CHLORIDE 0.9 % IV SOLN
1.0000 g | Freq: Once | INTRAVENOUS | Status: AC
  Administered 2021-04-10: 1 g via INTRAVENOUS
  Filled 2021-04-10: qty 10

## 2021-04-10 MED ORDER — ACETAMINOPHEN 500 MG PO TABS
1000.0000 mg | ORAL_TABLET | Freq: Once | ORAL | Status: AC
  Administered 2021-04-11: 1000 mg via ORAL
  Filled 2021-04-10: qty 2

## 2021-04-10 NOTE — ED Triage Notes (Signed)
Pt BIB EMS from home c/o SOB and weakness x2days. Pt report fever 101.6 at home and took tylenol today. Pt denies N/V/D. Pt a/ox4.  CBG 134 BP 150/90 HR 80 RR 20 O2sat 99% on RA

## 2021-04-10 NOTE — ED Provider Notes (Signed)
[facility] DEPT Provider Note   CSN: [id number] Arrival date & time: 04/10/21  2249     History Chief Complaint  Patient presents with   Weakness   Shortness of Breath    Oscar [name] is a 87 y.o. male.   Weakness Associated symptoms: cough and shortness of breath   Shortness of Breath Associated symptoms: cough    87 year old male with medical history significant for esophageal cancer, erectile dysfunction, gout, HTN, HLD, SBO, presenting to the emergency department with a complaint of shortness of breath and generalized weakness, difficulty with performing his ADLs at home due to severe fatigue over the past 2 days.  Patient states that his wife has dementia and is not able to help him much with tasks around the house.  He endorses a fever to 101.6 at home.  He did take Tylenol earlier today.  Endorses a mildly productive cough with shortness of breath.  Endorses decreased oral intake.  Past Medical History:  Diagnosis Date   Acid reflux    Barrett's esophagus    BPH (benign prostatic hyperplasia)    Colon polyps    DDD (degenerative disc disease)    Diabetes mellitus    controlled with diet and exercise   ED (erectile dysfunction)    Esophageal cancer ([place]) 2001   tx'd surgery - abdominal & right posterior chest approach (2001) - [place], [place]   First degree AV block    Gout    Hemorrhoids    History of palpitations    Hyperlipidemia    Hypertension    Lipoma    Obesity    Osteoarthritis    Small bowel obstruction (HCC)    Tinea versicolor    Tinnitus     Patient Active Problem List   Diagnosis Date Noted   Sepsis ([place]) 04/11/2021   Influenza A 04/11/2021   Hyponatremia 12/27/2019   Hyperkalemia 12/26/2019   ARF (acute renal failure) ([place]) 12/26/2019   History of esophagectomy 12/26/2019   Tobacco abuse 10/19/2018   BPH (benign prostatic hyperplasia) 10/19/2018   AKI (acute kidney injury) ([place])    Acute on chronic  respiratory failure with hypoxia ([place]) 11/15/2017   Malnutrition of moderate degree 02/15/2017   Leukocytosis 02/12/2017   OSA (obstructive sleep apnea) 02/12/2017   Dehydration 02/12/2017   SBO (small bowel obstruction) - recurrent 10/31/2016   Aortic insufficiency 07/09/2013   HTN (hypertension) 07/09/2013   GERD 07/13/2009   DYSPHAGIA 07/13/2009   FLATULENCE-GAS-BLOATING 07/13/2009   History of esophageal cancer 2001 07/18/1999    Past Surgical History:  Procedure Laterality Date   bilateral olecranon bursal excisions     ESOPHAGECTOMY  2001   [place] [facility]/Abd approach.  High Grade Dysplasia w esophageal CA   HEMORRHOID SURGERY     LAPAROSCOPY N/A 12/27/2019   Procedure: LAPAROSCOPY DIAGNOSTI; LYSIS OF ADHESIONS;  Surgeon: Michael Boston, MD;  Location: [facility];  Service: General;  Laterality: N/A;   NM MYOCAR PERF WALL MOTION  2011   dipyridamole myoview - stress images show medium in size, moderate in intensity perfusion defect in basal inferior & mid inferior walls with mild defect reversibility at rest, EF 64%   TRANSTHORACIC ECHOCARDIOGRAM  2011   EF=>55%, mild mitral annular calcif, mild calcif of MV apparatus, mild TR, normal RSVP, mild AV regurg, aortic root sclerosis/calcifiication   VASECTOMY         Family History  Problem Relation Age of Onset   Diabetes Mother  CAD Mother    Stroke Sister    Colon cancer Neg Hx    Esophageal cancer Neg Hx    Rectal cancer Neg Hx    Stomach cancer Neg Hx     Social History   Tobacco Use   Smoking status: Some Days    Types: Cigars, Pipe   Smokeless tobacco: Never   Tobacco comments:    2-3x/daily, sometimes not at all , tobacco info given 08/01/13  Vaping Use   Vaping Use: Never used  Substance Use Topics   Alcohol use: Yes    Alcohol/week: 7.0 - 14.0 standard drinks    Types: 7 - 14 Standard drinks or equivalent per week   Drug use: No    Home Medications Prior to Admission medications    Medication Sig Start Date End Date Taking? Authorizing Provider  amLODipine (NORVASC) 5 MG tablet Take 5 mg by mouth daily. 09/29/20  Yes [provider]  atenolol (TENORMIN) 25 MG tablet Take 25 mg by mouth daily. 03/23/19  Yes [provider]  finasteride (PROSCAR) 5 MG tablet Take 5 mg by mouth daily.   Yes [provider]  losartan-hydrochlorothiazide (HYZAAR) 100-12.5 MG tablet Take 1 tablet by mouth daily. 12/07/20  Yes [provider]  Misc Natural Products ([place]) CAPS Take 1 capsule by mouth daily. [place]metto   Yes [provider]  omeprazole (PRILOSEC) 20 MG capsule Take 40 mg by mouth daily.   Yes [provider]  polyethylene glycol powder (GLYCOLAX/MIRALAX) 17 GM/SCOOP powder Take 17 g by mouth daily.   Yes [provider]  vitamin B-12 (CYANOCOBALAMIN) 1000 MCG tablet Take 1,000 mcg by mouth daily. 07/14/15  Yes [provider]    Allergies    Bee venom, Flomax [tamsulosin], and Tadalafil  Review of Systems   Review of Systems  Constitutional:  Positive for chills and fatigue.  Respiratory:  Positive for cough and shortness of breath.   Neurological:  Positive for weakness.  All other systems reviewed and are negative.  Physical Exam Updated Vital Signs BP (!) 121/55   Pulse 67   Temp 98.8 F (37.1 C) (Oral)   Resp 19   Ht 6' (1.829 m)   Wt 74.8 kg   SpO2 93%   BMI 22.38 kg/m   Physical Exam Vitals and nursing note reviewed.  Constitutional:      General: He is not in acute distress.    Appearance: He is well-developed.  HENT:     Head: Normocephalic and atraumatic.  Eyes:     Conjunctiva/sclera: Conjunctivae normal.     Pupils: Pupils are equal, round, and reactive to light.  Cardiovascular:     Rate and Rhythm: Normal rate and regular rhythm.     Heart sounds: No murmur heard. Pulmonary:     Effort: Pulmonary effort is normal. No respiratory distress.     Breath sounds:  Normal breath sounds.  Abdominal:     General: There is no distension.     Palpations: Abdomen is soft.     Tenderness: There is no abdominal tenderness. There is no guarding.  Musculoskeletal:        General: No swelling, deformity or signs of injury.     Cervical back: Neck supple.  Skin:    General: Skin is warm and dry.     Capillary Refill: Capillary refill takes less than 2 seconds.     Findings: No lesion or rash.  Neurological:  General: No focal deficit present.     Mental Status: He is alert. Mental status is at baseline.  Psychiatric:        Mood and Affect: Mood normal.    ED Results / Procedures / Treatments   Labs (all labs ordered are listed, but only abnormal results are displayed) Labs Reviewed  RESP PANEL BY RT-PCR (FLU A&B, COVID) ARPGX2 - Abnormal; Notable for the following components:      Result Value   Influenza A by PCR POSITIVE (*)    All other components within normal limits  COMPREHENSIVE METABOLIC PANEL - Abnormal; Notable for the following components:   Sodium 130 (*)    Chloride 94 (*)    Glucose, Bld 101 (*)    Calcium 8.6 (*)    All other components within normal limits  CBC WITH DIFFERENTIAL/PLATELET - Abnormal; Notable for the following components:   RBC 3.95 (*)    Hemoglobin 12.9 (*)    HCT 38.3 (*)    Neutro Abs 8.0 (*)    Lymphs Abs 0.4 (*)    All other components within normal limits  URINALYSIS, ROUTINE W REFLEX MICROSCOPIC - Abnormal; Notable for the following components:   APPearance HAZY (*)    Hgb urine dipstick LARGE (*)    Protein, ur 100 (*)    RBC / HPF >50 (*)    All other components within normal limits  CBC - Abnormal; Notable for the following components:   WBC 11.8 (*)    All other components within normal limits  CULTURE, BLOOD (ROUTINE X 2)  CULTURE, BLOOD (ROUTINE X 2)  LACTIC ACID, PLASMA  PROTIME-INR  PROCALCITONIN    EKG EKG Interpretation  Date/Time:  Saturday April 10 2021 23:41:30  EST Ventricular Rate:  73 PR Interval:  205 QRS Duration: 95 QT Interval:  360 QTC Calculation: 397 R Axis:   36 Text Interpretation: Sinus rhythm Probable anteroseptal infarct, old Confirmed by Regan Lemming ([id number]) on 04/10/2021 11:48:03 PM  Radiology DG Chest 2 View  Result Date: 04/11/2021 CLINICAL DATA:  Suspected sepsis, shortness of breath.  Weakness EXAM: CHEST - 2 VIEW COMPARISON:  Chest x-ray 11/20/2017, CT chest 11/16/2017, CT chest 02/12/2017 FINDINGS: The heart and mediastinal contours are unchanged. Aortic calcification Pulmonary surgical staples overlie the mediastinum. Question vague patchy airspace opacities of the right lung. Prominent interstitial markings with no overt pulmonary edema. No pleural effusion. No pneumothorax. No acute osseous abnormality.  Old healed right rib fracture. IMPRESSION: 1. Question vague patchy airspace opacities of the right lung. 2.  Aortic Atherosclerosis (ICD10-I70.0). Electronically Signed   By: Iven Finn M.D.   On: 04/11/2021 00:00    Procedures Procedures   Medications Ordered in ED Medications  enoxaparin (LOVENOX) injection 40 mg (40 mg Subcutaneous Given 04/11/21 0924)  cefTRIAXone (ROCEPHIN) 1 g in sodium chloride 0.9 % 100 mL IVPB (has no administration in time range)  azithromycin (ZITHROMAX) 500 mg in sodium chloride 0.9 % 250 mL IVPB (has no administration in time range)  oseltamivir (TAMIFLU) capsule 30 mg (has no administration in time range)  acetaminophen (TYLENOL) tablet 650 mg (has no administration in time range)  atenolol (TENORMIN) tablet 25 mg (25 mg Oral Given 04/11/21 0923)  pantoprazole (PROTONIX) EC tablet 40 mg (40 mg Oral Given 04/11/21 0923)  finasteride (PROSCAR) tablet 5 mg (5 mg Oral Given 04/11/21 0923)  vitamin B-12 (CYANOCOBALAMIN) tablet 1,000 mcg (1,000 mcg Oral Given 04/11/21 0923)  polyethylene glycol (MIRALAX / GLYCOLAX) packet 17  g (17 g Oral Given 04/11/21 0927)  losartan (COZAAR) tablet 100 mg (100  mg Oral Given 04/11/21 0923)  hydrochlorothiazide (HYDRODIURIL) tablet 12.5 mg (12.5 mg Oral Given 04/11/21 0923)  cefTRIAXone (ROCEPHIN) 1 g in sodium chloride 0.9 % 100 mL IVPB (0 g Intravenous Stopped 04/11/21 0014)  azithromycin (ZITHROMAX) 500 mg in sodium chloride 0.9 % 250 mL IVPB (0 mg Intravenous Stopped 04/11/21 0131)  acetaminophen (TYLENOL) tablet 1,000 mg (1,000 mg Oral Given 04/11/21 0015)  oseltamivir (TAMIFLU) capsule 75 mg (75 mg Oral Given 04/11/21 0125)    ED Course  I have reviewed the triage vital signs and the nursing notes.  Pertinent labs & imaging results that were available during my care of the patient were reviewed by me and considered in my medical decision making (see chart for details).  Clinical Course as of 04/11/21 1257  Sun Apr 11, 2021  0240 Influenza A By PCR(!): POSITIVE [JL]    Clinical Course User Index [JL] Regan Lemming, MD   MDM Rules/Calculators/A&P                           87 year old male with medical history significant for esophageal cancer, erectile dysfunction, gout, HTN, HLD, SBO, presenting to the emergency department with a complaint of shortness of breath and generalized weakness, difficulty with performing his ADLs at home due to severe fatigue over the past 2 days.  Patient states that his wife has dementia and is not able to help him much with tasks around the house.  He endorses a fever to 101.6 at home.  He did take Tylenol earlier today.  Endorses a mildly productive cough with shortness of breath.  Endorses decreased oral intake.  On arrival, the patient was febrile to 100.4, not tachycardic or tachypneic, mildly hypertensive, saturating well on room air.  Patient became more tachypneic, febrile in the emergency department, needing SIRS criteria with a suspected respiratory source.  Not hypotensive.  Does not appear significantly hypovolemic.,  Suspect tachypnea due to the patient's fever, Tylenol administered.  Patient presents with  flulike symptoms over the past few days with decreased oral intake and decreased ability to perform his ADLs.  A chest x-ray was performed which revealed an opacity of the right lower lobe.  The patient was covered for possible pneumonia with Rocephin and azithromycin.  He does not have a leukocytosis with a white blood cell count of 8.8.  He has a mildly productive cough that has been worsening over the past few days with associated fatigue.  COVID-19 and influenza testing was collected and ultimately resulted positive for influenza A.  Due to the patient's short duration of symptoms with symptoms over the last 2 days, Tamiflu was started.    Given the patient's influenza, concern for developing bacterial pneumonia, severe fatigue and decreased ability to perform ADLs, Dr. Flossie Buffy of hospitalist medicine was consulted for admission who accepted the patient in admission. Final Clinical Impression(s) / ED Diagnoses Final diagnoses:  Influenza A    Rx / DC Orders ED Discharge Orders     None        Regan Lemming, MD 04/11/21 1257

## 2021-04-11 ENCOUNTER — Encounter (HOSPITAL_COMMUNITY): Payer: Self-pay | Admitting: Internal Medicine

## 2021-04-11 DIAGNOSIS — Z8249 Family history of ischemic heart disease and other diseases of the circulatory system: Secondary | ICD-10-CM | POA: Diagnosis not present

## 2021-04-11 DIAGNOSIS — K5909 Other constipation: Secondary | ICD-10-CM | POA: Diagnosis present

## 2021-04-11 DIAGNOSIS — Z888 Allergy status to other drugs, medicaments and biological substances status: Secondary | ICD-10-CM | POA: Diagnosis not present

## 2021-04-11 DIAGNOSIS — J101 Influenza due to other identified influenza virus with other respiratory manifestations: Secondary | ICD-10-CM

## 2021-04-11 DIAGNOSIS — R0902 Hypoxemia: Secondary | ICD-10-CM | POA: Diagnosis present

## 2021-04-11 DIAGNOSIS — I10 Essential (primary) hypertension: Secondary | ICD-10-CM | POA: Diagnosis not present

## 2021-04-11 DIAGNOSIS — I440 Atrioventricular block, first degree: Secondary | ICD-10-CM | POA: Diagnosis present

## 2021-04-11 DIAGNOSIS — R531 Weakness: Secondary | ICD-10-CM | POA: Diagnosis present

## 2021-04-11 DIAGNOSIS — Z20822 Contact with and (suspected) exposure to covid-19: Secondary | ICD-10-CM | POA: Diagnosis present

## 2021-04-11 DIAGNOSIS — K2270 Barrett's esophagus without dysplasia: Secondary | ICD-10-CM | POA: Diagnosis present

## 2021-04-11 DIAGNOSIS — Z91030 Bee allergy status: Secondary | ICD-10-CM | POA: Diagnosis not present

## 2021-04-11 DIAGNOSIS — Z79899 Other long term (current) drug therapy: Secondary | ICD-10-CM | POA: Diagnosis not present

## 2021-04-11 DIAGNOSIS — E119 Type 2 diabetes mellitus without complications: Secondary | ICD-10-CM | POA: Diagnosis present

## 2021-04-11 DIAGNOSIS — F17290 Nicotine dependence, other tobacco product, uncomplicated: Secondary | ICD-10-CM | POA: Diagnosis present

## 2021-04-11 DIAGNOSIS — Z8501 Personal history of malignant neoplasm of esophagus: Secondary | ICD-10-CM | POA: Diagnosis not present

## 2021-04-11 DIAGNOSIS — K219 Gastro-esophageal reflux disease without esophagitis: Secondary | ICD-10-CM | POA: Diagnosis present

## 2021-04-11 DIAGNOSIS — F1290 Cannabis use, unspecified, uncomplicated: Secondary | ICD-10-CM | POA: Diagnosis present

## 2021-04-11 DIAGNOSIS — Z8719 Personal history of other diseases of the digestive system: Secondary | ICD-10-CM | POA: Diagnosis not present

## 2021-04-11 DIAGNOSIS — Z833 Family history of diabetes mellitus: Secondary | ICD-10-CM | POA: Diagnosis not present

## 2021-04-11 DIAGNOSIS — E785 Hyperlipidemia, unspecified: Secondary | ICD-10-CM | POA: Diagnosis present

## 2021-04-11 DIAGNOSIS — N400 Enlarged prostate without lower urinary tract symptoms: Secondary | ICD-10-CM | POA: Diagnosis present

## 2021-04-11 DIAGNOSIS — A4189 Other specified sepsis: Secondary | ICD-10-CM | POA: Diagnosis present

## 2021-04-11 DIAGNOSIS — Z823 Family history of stroke: Secondary | ICD-10-CM | POA: Diagnosis not present

## 2021-04-11 DIAGNOSIS — M109 Gout, unspecified: Secondary | ICD-10-CM | POA: Diagnosis present

## 2021-04-11 DIAGNOSIS — A419 Sepsis, unspecified organism: Secondary | ICD-10-CM

## 2021-04-11 LAB — URINALYSIS, ROUTINE W REFLEX MICROSCOPIC
Bacteria, UA: NONE SEEN
Bilirubin Urine: NEGATIVE
Glucose, UA: NEGATIVE mg/dL
Ketones, ur: NEGATIVE mg/dL
Leukocytes,Ua: NEGATIVE
Nitrite: NEGATIVE
Protein, ur: 100 mg/dL — AB
RBC / HPF: >50 RBC/hpf — ABNORMAL HIGH (ref 0–5)
Specific Gravity, Urine: 1.015 (ref 1.005–1.030)
pH: 6 (ref 5.0–8.0)

## 2021-04-11 LAB — PROCALCITONIN: Procalcitonin: 0.58 ng/mL

## 2021-04-11 LAB — BLOOD CULTURE ID PANEL (REFLEXED) - BCID2

## 2021-04-11 LAB — RESP PANEL BY RT-PCR (FLU A&B, COVID) ARPGX2
Influenza A by PCR: POSITIVE — AB
Influenza B by PCR: NEGATIVE
SARS Coronavirus 2 by RT PCR: NEGATIVE

## 2021-04-11 LAB — CBC
HCT: 41.3 % (ref 39.0–52.0)
Hemoglobin: 13.8 g/dL (ref 13.0–17.0)
MCH: 32.5 pg (ref 26.0–34.0)
MCHC: 33.4 g/dL (ref 30.0–36.0)
MCV: 97.4 fL (ref 80.0–100.0)
Platelets: 190 10*3/uL (ref 150–400)
RBC: 4.24 MIL/uL (ref 4.22–5.81)
RDW: 11.9 % (ref 11.5–15.5)
WBC: 11.8 10*3/uL — ABNORMAL HIGH (ref 4.0–10.5)
nRBC: 0 % (ref 0.0–0.2)

## 2021-04-11 MED ORDER — SODIUM CHLORIDE 0.9 % IV SOLN
1.0000 g | INTRAVENOUS | Status: DC
  Administered 2021-04-11: 1 g via INTRAVENOUS
  Filled 2021-04-11: qty 10

## 2021-04-11 MED ORDER — LOSARTAN POTASSIUM-HCTZ 100-12.5 MG PO TABS: 1.0000 | ORAL_TABLET | Freq: Every day | ORAL | Status: DC

## 2021-04-11 MED ORDER — POLYETHYLENE GLYCOL 3350 17 G PO PACK
17.0000 g | PACK | Freq: Every day | ORAL | Status: DC
  Administered 2021-04-11: 09:00:00 17 g via ORAL
  Filled 2021-04-11 (×2): qty 1

## 2021-04-11 MED ORDER — ENSURE ENLIVE PO LIQD
237.0000 mL | Freq: Two times a day (BID) | ORAL | Status: DC
  Administered 2021-04-12: 237 mL via ORAL

## 2021-04-11 MED ORDER — OSELTAMIVIR PHOSPHATE 75 MG PO CAPS
75.0000 mg | ORAL_CAPSULE | Freq: Once | ORAL | Status: AC
  Administered 2021-04-11: 01:00:00 75 mg via ORAL
  Filled 2021-04-11: qty 1

## 2021-04-11 MED ORDER — SODIUM CHLORIDE 0.9 % IV SOLN: INTRAVENOUS | Status: DC | PRN

## 2021-04-11 MED ORDER — OSELTAMIVIR PHOSPHATE 30 MG PO CAPS
30.0000 mg | ORAL_CAPSULE | Freq: Two times a day (BID) | ORAL | Status: DC
  Administered 2021-04-12: 30 mg via ORAL
  Filled 2021-04-11: qty 1

## 2021-04-11 MED ORDER — HYDROCHLOROTHIAZIDE 12.5 MG PO TABS
12.5000 mg | ORAL_TABLET | Freq: Every day | ORAL | Status: DC
  Administered 2021-04-11 – 2021-04-12 (×2): 12.5 mg via ORAL
  Filled 2021-04-11 (×2): qty 1

## 2021-04-11 MED ORDER — ACETAMINOPHEN 325 MG PO TABS: 650.0000 mg | ORAL_TABLET | Freq: Four times a day (QID) | ORAL | Status: DC | PRN

## 2021-04-11 MED ORDER — VITAMIN B-12 1000 MCG PO TABS
1000.0000 ug | ORAL_TABLET | Freq: Every day | ORAL | Status: DC
  Administered 2021-04-11 – 2021-04-12 (×2): 1000 ug via ORAL
  Filled 2021-04-11 (×2): qty 1

## 2021-04-11 MED ORDER — LOSARTAN POTASSIUM 50 MG PO TABS
100.0000 mg | ORAL_TABLET | Freq: Every day | ORAL | Status: DC
  Administered 2021-04-11 – 2021-04-12 (×2): 100 mg via ORAL
  Filled 2021-04-11: qty 4
  Filled 2021-04-11: qty 2

## 2021-04-11 MED ORDER — AMLODIPINE BESYLATE 5 MG PO TABS: 5.0000 mg | ORAL_TABLET | Freq: Every day | ORAL | Status: DC

## 2021-04-11 MED ORDER — SODIUM CHLORIDE 0.9 % IV SOLN
500.0000 mg | INTRAVENOUS | Status: DC
  Administered 2021-04-12: 500 mg via INTRAVENOUS
  Filled 2021-04-11: qty 500

## 2021-04-11 MED ORDER — ENOXAPARIN SODIUM 40 MG/0.4ML IJ SOSY
40.0000 mg | PREFILLED_SYRINGE | INTRAMUSCULAR | Status: DC
  Administered 2021-04-11 – 2021-04-12 (×2): 40 mg via SUBCUTANEOUS
  Filled 2021-04-11 (×2): qty 0.4

## 2021-04-11 MED ORDER — ATENOLOL 25 MG PO TABS
25.0000 mg | ORAL_TABLET | Freq: Every day | ORAL | Status: DC
  Administered 2021-04-11 – 2021-04-12 (×2): 25 mg via ORAL
  Filled 2021-04-11 (×2): qty 1

## 2021-04-11 MED ORDER — FINASTERIDE 5 MG PO TABS
5.0000 mg | ORAL_TABLET | Freq: Every day | ORAL | Status: DC
  Administered 2021-04-11 – 2021-04-12 (×2): 5 mg via ORAL
  Filled 2021-04-11 (×2): qty 1

## 2021-04-11 MED ORDER — METHOCARBAMOL 500 MG PO TABS
500.0000 mg | ORAL_TABLET | Freq: Three times a day (TID) | ORAL | Status: DC
  Administered 2021-04-11 – 2021-04-12 (×3): 500 mg via ORAL
  Filled 2021-04-11 (×3): qty 1

## 2021-04-11 MED ORDER — LIP MEDEX EX OINT
TOPICAL_OINTMENT | CUTANEOUS | Status: DC | PRN
  Administered 2021-04-11: 1 via TOPICAL
  Filled 2021-04-11: qty 7

## 2021-04-11 MED ORDER — ORAL CARE MOUTH RINSE
15.0000 mL | Freq: Two times a day (BID) | OROMUCOSAL | Status: DC
  Administered 2021-04-11 – 2021-04-12 (×2): 15 mL via OROMUCOSAL

## 2021-04-11 MED ORDER — PANTOPRAZOLE SODIUM 40 MG PO TBEC
40.0000 mg | DELAYED_RELEASE_TABLET | Freq: Every day | ORAL | Status: DC
  Administered 2021-04-11 – 2021-04-12 (×2): 40 mg via ORAL
  Filled 2021-04-11 (×2): qty 1

## 2021-04-11 NOTE — Progress Notes (Signed)
PHARMACY - PHYSICIAN COMMUNICATION CRITICAL VALUE ALERT - BLOOD CULTURE IDENTIFICATION (BCID)  Oscar [name] is an 87 y.o. male who presented to [facility] on 04/10/2021 with a chief complaint of cough and weakness  Assessment:  GPC aerobic, 1/4 staph epi, no R  Name of physician (or Provider) Contacted: x [facility]  Current antibiotics: azith/CTX  Changes to prescribed antibiotics recommended:  No changes , probable contaminant  Results for orders placed or performed during the hospital encounter of 04/10/21  Blood Culture ID Panel (Reflexed) (Collected: 04/10/2021 11:29 PM)  Result Value Ref Range   Enterococcus faecalis NOT DETECTED NOT DETECTED   Enterococcus Faecium NOT DETECTED NOT DETECTED   Listeria monocytogenes NOT DETECTED NOT DETECTED   Staphylococcus species DETECTED (A) NOT DETECTED   Staphylococcus aureus (BCID) NOT DETECTED NOT DETECTED   Staphylococcus epidermidis DETECTED (A) NOT DETECTED   Staphylococcus lugdunensis NOT DETECTED NOT DETECTED   Streptococcus species NOT DETECTED NOT DETECTED   Streptococcus agalactiae NOT DETECTED NOT DETECTED   Streptococcus pneumoniae NOT DETECTED NOT DETECTED   Streptococcus pyogenes NOT DETECTED NOT DETECTED   A.calcoaceticus-baumannii NOT DETECTED NOT DETECTED   Bacteroides fragilis NOT DETECTED NOT DETECTED   Enterobacterales NOT DETECTED NOT DETECTED   Enterobacter cloacae complex NOT DETECTED NOT DETECTED   Escherichia coli NOT DETECTED NOT DETECTED   Klebsiella aerogenes NOT DETECTED NOT DETECTED   Klebsiella oxytoca NOT DETECTED NOT DETECTED   Klebsiella pneumoniae NOT DETECTED NOT DETECTED   Proteus species NOT DETECTED NOT DETECTED   Salmonella species NOT DETECTED NOT DETECTED   Serratia marcescens NOT DETECTED NOT DETECTED   Haemophilus influenzae NOT DETECTED NOT DETECTED   Neisseria meningitidis NOT DETECTED NOT DETECTED   Pseudomonas aeruginosa NOT DETECTED NOT DETECTED   Stenotrophomonas maltophilia NOT  DETECTED NOT DETECTED   Candida albicans NOT DETECTED NOT DETECTED   Candida auris NOT DETECTED NOT DETECTED   Candida glabrata NOT DETECTED NOT DETECTED   Candida krusei NOT DETECTED NOT DETECTED   Candida parapsilosis NOT DETECTED NOT DETECTED   Candida tropicalis NOT DETECTED NOT DETECTED   Cryptococcus neoformans/gattii NOT DETECTED NOT DETECTED   Methicillin resistance mecA/C NOT DETECTED NOT DETECTED   Dolly Rias RPh 04/11/2021, 11:39 PM

## 2021-04-11 NOTE — Progress Notes (Signed)
[facility] OF CARE NOTE Patient: Oscar [name] PJS:[id number]   PCP: Deland Pretty, MD DOB: 06/03/1932   DOA: 04/10/2021   DOS: 04/11/2021    Patient was admitted by my colleague earlier on 04/11/2021. I have reviewed the H&P as well as assessment and plan and agree with the same. Important changes in the plan are listed below.  Plan of care: Principal Problem:   Sepsis ([place]) Active Problems:   HTN (hypertension)   Influenza A Patient wants to go home although he remains hypoxic therefore unsafe discharge for now. Continue to monitor overnight.   Author: Berle Mull, MD [facility] 04/11/2021 2:44 PM   If 7PM-7AM, please contact night-coverage at [email or web address]

## 2021-04-11 NOTE — Plan of Care (Signed)
  Problem: Education: Goal: Knowledge of General Education information will improve Description Including pain rating scale, medication(s)/side effects and non-pharmacologic comfort measures Outcome: Progressing   

## 2021-04-11 NOTE — H&P (Signed)
History and Physical    Oscar [name] WER:[id number] DOB: 26-Aug-1932 DOA: 04/10/2021  PCP: Deland Pretty, MD  Patient coming from: Home  I have personally briefly reviewed patient's old medical records in [place]  Chief Complaint: Cough and weakness  HPI: Oscar [name] is a 87 y.o. male with medical history significant for remote history of esophageal cancer s/p esophagectomy, Barrett's, SBP, hypertension, hyperlipidemia, gout, BPH who presents with concerns of cough and weakness.  For the past 2 days, he has felt fatigued and tired.  Sleeping more often.  Had productive cough with sneezing.  Had some mild shortness of breath.  Felt nauseous but no vomiting.  Denies diarrhea and has chronic constipation issues.  No abdominal pain.  ED Course: He was febrile up to 104.87F, normotensive, tachypneic but stable on room air. WBC of 8.8, hemoglobin 12.9, lactate of 1.2 Sodium of 130, K of 3.9, creatinine of 1.01, BG of 101  Flu A PCR positive Chest x-ray showed possible opacity of the right lower lobe.  He was started on empiric Rocephin and azithromycin for possible pneumonia.  Started on Tamiflu.  Hospitalist on-call for admission.  Review of Systems: Constitutional: No Weight Change, No Fever ENT/Mouth: No sore throat, +Rhinorrhea Eyes: No Vision Changes Cardiovascular: No Chest Pain,+ SOB Respiratory: + Cough, No Sputum, No Wheezing, + Dyspnea  Gastrointestinal: + Nausea, No Vomiting, No Diarrhea, No Constipation, No Pain Genitourinary: no Urinary Incontinence, Musculoskeletal: No Arthralgias, No Myalgias Skin: No Skin Lesions, No Pruritus, Neuro: no Weakness, No Numbness Psych: No Anxiety/Panic, No Depression, + decrease appetite Heme/Lymph: No Bruising, No Bleeding  Social  Pt lives with wife, son and grandson. Smokes a pipe with marijuana daily.  Has about a shot of liquor daily.  Past Medical History:  Diagnosis Date   Acid reflux    Barrett's esophagus     BPH (benign prostatic hyperplasia)    Colon polyps    DDD (degenerative disc disease)    Diabetes mellitus    controlled with diet and exercise   ED (erectile dysfunction)    Esophageal cancer ([facility]) 2001   tx'd surgery - abdominal & right posterior chest approach (2001) - [place], [place]   First degree AV block    Gout    Hemorrhoids    History of palpitations    Hyperlipidemia    Hypertension    Lipoma    Obesity    Osteoarthritis    Small bowel obstruction ([facility])    Tinea versicolor    Tinnitus     Past Surgical History:  Procedure Laterality Date   bilateral olecranon bursal excisions     ESOPHAGECTOMY  2001   [facility] approach.  High Grade Dysplasia w esophageal CA   HEMORRHOID SURGERY     LAPAROSCOPY N/A 12/27/2019   Procedure: LAPAROSCOPY DIAGNOSTI; LYSIS OF ADHESIONS;  Surgeon: Michael Boston, MD;  Location: [facility];  Service: General;  Laterality: N/A;   NM MYOCAR PERF WALL MOTION  2011   dipyridamole myoview - stress images show medium in size, moderate in intensity perfusion defect in basal inferior & mid inferior walls with mild defect reversibility at rest, EF 64%   TRANSTHORACIC ECHOCARDIOGRAM  2011   EF=>55%, mild mitral annular calcif, mild calcif of MV apparatus, mild TR, normal RSVP, mild AV regurg, aortic root sclerosis/calcifiication   VASECTOMY        Allergies  Allergen Reactions   Bee Venom Anaphylaxis   Flomax [Tamsulosin] Other (See  Comments)    "Makes me feel weird"   Tadalafil     Other reaction(s): Nasal congestion    Family History  Problem Relation Age of Onset   Diabetes Mother    CAD Mother    Stroke Sister    Colon cancer Neg Hx    Esophageal cancer Neg Hx    Rectal cancer Neg Hx    Stomach cancer Neg Hx      Prior to Admission medications   Medication Sig Start Date End Date Taking? Authorizing Provider  atenolol (TENORMIN) 25 MG tablet Take 25 mg by mouth daily. 03/23/19  Yes [provider]  amLODipine (NORVASC) 5 MG tablet Take 5 mg by mouth daily. 09/29/20   [provider]  HYDROcodone-Acetaminophen 2.5-108 MG/5ML SOLN Take 5 mLs by mouth at bedtime as needed. 10/27/20   Hilts, Legrand Como, MD  ibuprofen (ADVIL) 600 MG tablet Take 600 mg by mouth 3 (three) times daily. 05/07/20   [provider]  losartan-hydrochlorothiazide (HYZAAR) 100-12.5 MG tablet Take 1 tablet by mouth daily. 12/07/20   [provider]  Misc Natural Products ([place]) CAPS Take 1 capsule by mouth daily.    [provider]  omeprazole (PRILOSEC) 20 MG capsule Take 40 mg by mouth daily.    [provider]  polyethylene glycol powder (GLYCOLAX/MIRALAX) 17 GM/SCOOP powder Take 34-68 g by mouth daily.    [provider]  vitamin B-12 (CYANOCOBALAMIN) 1000 MCG tablet 1 tablet 07/14/15   [provider]    Physical Exam: Vitals:   04/10/21 2303 04/10/21 2309 04/10/21 2323 04/10/21 2330  BP: (!) 144/57   (!) 137/51  Pulse: 75   75  Resp: 20 (!) 24  (!) 25  Temp: (!) 100.4 F (38 C) (!) 101.8 F (38.8 C) (!) 104.6 F (40.3 C)   TempSrc: Oral Rectal Rectal   SpO2: 97%   95%  Weight:      Height:        Constitutional: NAD, calm, comfortable, nontoxic well-appearing thin elderly gentleman lying flat in bed Vitals:   04/10/21 2303 04/10/21 2309 04/10/21 2323 04/10/21 2330  BP: (!) 144/57   (!) 137/51  Pulse: 75   75  Resp: 20 (!) 24  (!) 25  Temp: (!) 100.4 F (38 C) (!) 101.8 F (38.8 C) (!) 104.6 F (40.3 C)   TempSrc: Oral Rectal Rectal   SpO2: 97%   95%  Weight:      Height:       Eyes: lids and conjunctivae normal ENMT: Mucous membranes are moist.  Neck: normal, supple Respiratory: clear to auscultation bilaterally, no wheezing, no crackles. Normal respiratory effort on room air. No accessory muscle use.  Cardiovascular: Regular rate and rhythm, no murmurs / rubs / gallops. No extremity edema.  Abdomen: no tenderness,  Bowel  sounds positive.  Musculoskeletal: no clubbing / cyanosis. No joint deformity upper and lower extremities.  Skin: no rashes, lesions, ulcers. Neurologic: CN 2-12 grossly intact. Strength 5/5 in all 4.  Psychiatric: Normal judgment and insight. Alert and oriented x 3. Normal mood.     Labs on Admission: I have personally reviewed following labs and imaging studies  CBC: Recent Labs  Lab 04/10/21 2321  WBC 8.8  NEUTROABS 8.0*  HGB 12.9*  HCT 38.3*  MCV 97.0  PLT 175   Basic Metabolic Panel: Recent Labs  Lab 04/10/21 2321  NA 130*  K 3.9  CL 94*  CO2 27  GLUCOSE 101*  BUN 14  CREATININE 1.01  CALCIUM 8.6*   GFR: Estimated Creatinine Clearance: 55.5 mL/min (by C-G formula based on SCr of 1.01 mg/dL). Liver Function Tests: Recent Labs  Lab 04/10/21 2321  AST 22  ALT 12  ALKPHOS 60  BILITOT 1.1  PROT 6.6  ALBUMIN 3.9   No results for input(s): LIPASE, AMYLASE in the last 168 hours. No results for input(s): AMMONIA in the last 168 hours. Coagulation Profile: Recent Labs  Lab 04/10/21 2321  INR 1.1   Cardiac Enzymes: No results for input(s): CKTOTAL, CKMB, CKMBINDEX, TROPONINI in the last 168 hours. BNP (last 3 results) No results for input(s): PROBNP in the last 8760 hours. HbA1C: No results for input(s): HGBA1C in the last 72 hours. CBG: No results for input(s): GLUCAP in the last 168 hours. Lipid Profile: No results for input(s): CHOL, HDL, LDLCALC, TRIG, CHOLHDL, LDLDIRECT in the last 72 hours. Thyroid Function Tests: No results for input(s): TSH, T4TOTAL, FREET4, T3FREE, THYROIDAB in the last 72 hours. Anemia Panel: No results for input(s): VITAMINB12, FOLATE, FERRITIN, TIBC, IRON, RETICCTPCT in the last 72 hours. Urine analysis:    Component Value Date/Time   COLORURINE YELLOW 04/10/2021 2318   APPEARANCEUR HAZY (A) 04/10/2021 2318   LABSPEC 1.015 04/10/2021 2318   PHURINE 6.0 04/10/2021 2318   GLUCOSEU NEGATIVE 04/10/2021 2318   HGBUR LARGE  (A) 04/10/2021 2318   BILIRUBINUR NEGATIVE 04/10/2021 2318   KETONESUR NEGATIVE 04/10/2021 2318   PROTEINUR 100 (A) 04/10/2021 2318   UROBILINOGEN 0.2 09/12/2014 1816   NITRITE NEGATIVE 04/10/2021 2318   LEUKOCYTESUR NEGATIVE 04/10/2021 2318    Radiological Exams on Admission: DG Chest 2 View  Result Date: 04/11/2021 CLINICAL DATA:  Suspected sepsis, shortness of breath.  Weakness EXAM: CHEST - 2 VIEW COMPARISON:  Chest x-ray 11/20/2017, CT chest 11/16/2017, CT chest 02/12/2017 FINDINGS: The heart and mediastinal contours are unchanged. Aortic calcification Pulmonary surgical staples overlie the mediastinum. Question vague patchy airspace opacities of the right lung. Prominent interstitial markings with no overt pulmonary edema. No pleural effusion. No pneumothorax. No acute osseous abnormality.  Old healed right rib fracture. IMPRESSION: 1. Question vague patchy airspace opacities of the right lung. 2.  Aortic Atherosclerosis (ICD10-I70.0). Electronically Signed   By: Iven Finn M.D.   On: 04/11/2021 00:00      Assessment/Plan  Sepsis secondary to influenza A -Patient presented with fever, tachypnea with positive flu PCR -Continue Tamiflu -CXR shows questionable right lower lobe opacity-continue IV Rocephin and Azithromycin for now pending procalcitonin   HTN controlled continue amlodipine, atenolol, losartan-HCTZ  GERD - Continue omeprazole  BPH  Continue Tamsulosin  DVT prophylaxis:.Lovenox Code Status: Full Family Communication: Plan discussed with patient at bedside  disposition Plan: Home with observation Consults called:  Admission status: Observation  Level of care: Telemetry  Status is: Observation  The patient remains OBS appropriate and will d/c before 2 midnights.        Orene Desanctis DO [facility]   If 7PM-7AM, please contact night-coverage [email or web address]   04/11/2021, 1:10 AM

## 2021-04-12 MED ORDER — AZITHROMYCIN 500 MG PO TABS: 500.0000 mg | ORAL_TABLET | Freq: Every day | ORAL | 0 refills | Status: DC

## 2021-04-12 MED ORDER — ENSURE ENLIVE PO LIQD: 237.0000 mL | Freq: Two times a day (BID) | ORAL | 0 refills | Status: DC

## 2021-04-12 MED ORDER — ONDANSETRON HCL 4 MG/2ML IJ SOLN
4.0000 mg | Freq: Three times a day (TID) | INTRAMUSCULAR | Status: DC | PRN
  Administered 2021-04-12: 4 mg via INTRAVENOUS
  Filled 2021-04-12: qty 2

## 2021-04-12 MED ORDER — CEFADROXIL 500 MG PO CAPS: 500.0000 mg | ORAL_CAPSULE | Freq: Two times a day (BID) | ORAL | 0 refills | Status: AC

## 2021-04-12 MED ORDER — METHOCARBAMOL 500 MG PO TABS: 500.0000 mg | ORAL_TABLET | Freq: Three times a day (TID) | ORAL | 0 refills | Status: DC | PRN

## 2021-04-12 MED ORDER — AZITHROMYCIN 250 MG PO TABS
500.0000 mg | ORAL_TABLET | Freq: Every day | ORAL | Status: DC
  Administered 2021-04-12: 500 mg via ORAL
  Filled 2021-04-12: qty 2

## 2021-04-12 MED ORDER — OSELTAMIVIR PHOSPHATE 30 MG PO CAPS: 30.0000 mg | ORAL_CAPSULE | Freq: Two times a day (BID) | ORAL | 0 refills | Status: AC

## 2021-04-12 NOTE — Evaluation (Signed)
Physical Therapy Evaluation Patient Details Name: Oscar [name] MRN: [id number] DOB: 1933-04-20 Today's Date: 04/12/2021  History of Present Illness  87 y.o. male with medical history significant for remote history of esophageal cancer s/p esophagectomy, Barrett's, SBP, hypertension, hyperlipidemia, gout, BPH who presents with concerns of cough and weakness. positive for flu A. Chest x-ray showed possible opacity of the right lower lobe.  Clinical Impression  Patient evaluated by Physical Therapy with no further acute PT needs identified. All education has been completed and the patient has no further questions.  Pt very pleasant and willing to mobilize. Amb ~360' with supervision. SpO2=92% or greater on RA. No significant DOE.  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  See below for any follow-up Physical Therapy or equipment needs. PT is signing off. Thank you for this referral.        Recommendations for follow up therapy are one component of a multi-disciplinary discharge planning process, led by the attending physician.  Recommendations may be updated based on patient status, additional functional criteria and insurance authorization.  Follow Up Recommendations No PT follow up    Assistance Recommended at Discharge PRN  Functional Status Assessment Patient has had a recent decline in their functional status and demonstrates the ability to make significant improvements in function in a reasonable and predictable amount of time.  Equipment Recommendations  None recommended by PT    Recommendations for Other Services       Precautions / Restrictions Precautions Precautions: Fall Restrictions Weight Bearing Restrictions: No      Mobility  Bed Mobility               General bed mobility comments: pt in recliner on arrival    Transfers Overall transfer level:  Needs assistance Equipment used: None Transfers: Sit to/from Stand Sit to Stand: Modified independent (Device/Increase time)                Ambulation/Gait Ambulation/Gait assistance: Min guard;Supervision Gait Distance (Feet): 360 Feet Assistive device: None;IV Pole Gait Pattern/deviations: Step-through pattern;Decreased stride length;Wide base of support       General Gait Details: mildly unsteady initially, stabilityimproved with distance. able to amb without IV pole without LOB partial distance  Stairs            Wheelchair Mobility    Modified Rankin (Stroke Patients Only)       Balance     Sitting balance-Leahy Scale: Normal       Standing balance-Leahy Scale: Fair Standing balance comment: Fair to Good (NT to mod challenges)             High level balance activites: Side stepping;Backward walking;Head turns;Turns High Level Balance Comments: no LOB with above, initermittent unilateral UE support             Pertinent Vitals/Pain Pain Assessment: No/denies pain    Home Living Family/patient expects to be discharged to:: Private residence Living Arrangements: Spouse/significant other;Children Available Help at Discharge: Family;Available PRN/intermittently (dtr and son assist) Type of Home: House           Home Equipment: Kasandra Knudsen - single point Additional Comments: pt is independent at baseline, uses cane occasionally. 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    Prior Function Prior Level of Function : Independent/Modified Independent  Hand Dominance        Extremity/Trunk Assessment   Upper Extremity Assessment Upper Extremity Assessment: Overall WFL for tasks assessed    Lower Extremity Assessment Lower Extremity Assessment: Overall WFL for tasks assessed       Communication   Communication: No difficulties  Cognition Arousal/Alertness: Awake/alert Behavior During Therapy: WFL for tasks  assessed/performed Overall Cognitive Status: Within Functional Limits for tasks assessed                                          General Comments      Exercises     Assessment/Plan    PT Assessment All further PT needs can be met in the next venue of care;Patient does not need any further PT services  PT Problem List         PT Treatment Interventions      PT Goals (Current goals can be found in the Care Plan section)  Acute Rehab PT Goals PT Goal Formulation: All assessment and education complete, DC therapy    Frequency     Barriers to discharge        Co-evaluation               AM-PAC PT "6 Clicks" Mobility  Outcome Measure Help needed turning from your back to your side while in a flat bed without using bedrails?: None   Help needed moving to and from a bed to a chair (including a wheelchair)?: None Help needed standing up from a chair using your arms (e.g., wheelchair or bedside chair)?: None Help needed to walk in hospital room?: None Help needed climbing 3-5 steps with a railing? : A Little 6 Click Score: 19    End of Session Equipment Utilized During Treatment: Gait belt Activity Tolerance: Patient tolerated treatment well Patient left: in chair;with call bell/phone within reach;with chair alarm set   PT Visit Diagnosis: Difficulty in walking, not elsewhere classified (R26.2)    Time: 4360-1658 PT Time Calculation (min) (ACUTE ONLY): 19 min   Charges:   PT Evaluation $PT Eval Low Complexity: [place], PT  Acute Rehab Dept ([place]) [phone] Pager [phone]  04/12/2021   [facility] 04/12/2021, 12:03 PM

## 2021-04-12 NOTE — Plan of Care (Signed)
  Problem: Education: Goal: Knowledge of General Education information will improve Description: Including pain rating scale, medication(s)/side effects and non-pharmacologic comfort measures Outcome: Progressing   Problem: Coping: Goal: Level of anxiety will decrease Outcome: Progressing   Problem: Safety: Goal: Ability to remain free from injury will improve Outcome: Progressing   

## 2021-04-12 NOTE — Progress Notes (Signed)
Patient discharged to home. Given all belongings, instructions. Verbalized understanding of instructions. Escorted to pov via w/c.

## 2021-04-13 LAB — CULTURE, BLOOD (ROUTINE X 2): Special Requests: ADEQUATE

## 2021-04-13 NOTE — Discharge Summary (Addendum)
Physician Discharge Summary   Patient name: Oscar [name]  Admit date:     04/10/2021  Discharge date: 04/12/2021   Discharge Physician: Berle Mull   PCP: Deland Pretty, MD   Recommendations at discharge: follow up with PCP in 1 week  Discharge Diagnoses Principal Problem:   Sepsis [facility]) Active Problems:   HTN (hypertension)   Influenza A   Hospital Course   Sepsis secondary to influenza A Patient presented with fever, tachypnea with positive flu PCR Continue Tamiflu CXR shows questionable right lower lobe opacity Was treated with IV Rocephin and Azithromycin, will continue Antibiotics as recommended.   HTN controlled Hold amlodipine, Continue atenolol, losartan-HCTZ   GERD Continue omeprazole   BPH  Continue Tamsulosin  Procedures performed: none   Condition at discharge: good  Exam General: Appear in mild distress, no Rash; Oral Mucosa clear. no Abnormal Neck Mass Or lumps, Conjunctiva normal  Cardiovascular: S1 and S2 Present, no Murmur Respiratory: increased respiratory effort, Bilateral Air entry present and no Crackles, bilateral expiratory wheezes Abdomen: Bowel Sound present, Soft and no tenderness Extremities: trace Pedal edema Neurology: alert and oriented to time, place, and person affect appropriate. no new focal deficit  Disposition: Home  Discharge time: greater than 30 minutes.   Allergies as of 04/12/2021       Reactions   Bee Venom Anaphylaxis   Flomax [tamsulosin] Other (See Comments)   "Makes me feel weird"   Tadalafil    Other reaction(s): Nasal congestion        Medication List     STOP taking these medications    amLODipine 5 MG tablet Commonly known as: NORVASC       TAKE these medications    atenolol 25 MG tablet Commonly known as: TENORMIN Take 25 mg by mouth daily.   azithromycin 500 MG tablet Commonly known as: ZITHROMAX Take 1 tablet (500 mg total) by mouth daily.   cefadroxil 500 MG  capsule Commonly known as: DURICEF Take 1 capsule (500 mg total) by mouth 2 (two) times daily for 3 days.   feeding supplement Liqd Take 237 mLs by mouth 2 (two) times daily between meals.   finasteride 5 MG tablet Commonly known as: PROSCAR Take 5 mg by mouth daily.   losartan-hydrochlorothiazide 100-12.5 MG tablet Commonly known as: HYZAAR Take 1 tablet by mouth daily.   methocarbamol 500 MG tablet Commonly known as: ROBAXIN Take 1 tablet (500 mg total) by mouth every 8 (eight) hours as needed for muscle spasms.   omeprazole 20 MG capsule Commonly known as: PRILOSEC Take 40 mg by mouth daily.   oseltamivir 30 MG capsule Commonly known as: TAMIFLU Take 1 capsule (30 mg total) by mouth 2 (two) times daily for 5 days.   polyethylene glycol powder 17 GM/SCOOP powder Commonly known as: GLYCOLAX/MIRALAX Take 17 g by mouth daily.   Prostate Health Caps Take 1 capsule by mouth daily. Saw Palmetto   vitamin B-12 1000 MCG tablet Commonly known as: CYANOCOBALAMIN Take 1,000 mcg by mouth daily.        Filed Weights   04/10/21 2255  Weight: 74.8 kg    DG Chest 2 View  Result Date: 04/11/2021 CLINICAL DATA:  Suspected sepsis, shortness of breath.  Weakness EXAM: CHEST - 2 VIEW COMPARISON:  Chest x-ray 11/20/2017, CT chest 11/16/2017, CT chest 02/12/2017 FINDINGS: The heart and mediastinal contours are unchanged. Aortic calcification Pulmonary surgical staples overlie the mediastinum. Question vague patchy airspace opacities of the right lung. Prominent interstitial  markings with no overt pulmonary edema. No pleural effusion. No pneumothorax. No acute osseous abnormality.  Old healed right rib fracture. IMPRESSION: 1. Question vague patchy airspace opacities of the right lung. 2.  Aortic Atherosclerosis (ICD10-I70.0). Electronically Signed   By: Iven Finn M.D.   On: 04/11/2021 00:00   Results for orders placed or performed during the hospital encounter of 04/10/21  Culture,  blood (Routine x 2)     Status: Abnormal   Collection Time: 04/10/21 11:29 PM   Specimen: BLOOD  Result Value Ref Range Status   Specimen Description   Final    BLOOD BLOOD RIGHT WRIST Performed at [facility] [address], [place], [place] [address]    Special Requests   Final    BOTTLES DRAWN AEROBIC AND ANAEROBIC Blood Culture adequate volume Performed at [facility] [address] [place], [place] [address]    Culture  Setup Time   Final    GRAM POSITIVE COCCI AEROBIC BOTTLE ONLY CRITICAL RESULT CALLED TO, READ BACK BY AND VERIFIED WITH: PHARMD ELLEN JACKSON 04/11/21@23 :48 BY TW IN BOTH AEROBIC AND ANAEROBIC BOTTLES    Culture (A)  Final    STAPHYLOCOCCUS EPIDERMIDIS THE SIGNIFICANCE OF ISOLATING THIS ORGANISM FROM A SINGLE SET OF BLOOD CULTURES WHEN MULTIPLE SETS ARE DRAWN IS UNCERTAIN. PLEASE NOTIFY THE MICROBIOLOGY DEPARTMENT WITHIN ONE WEEK IF SPECIATION AND SENSITIVITIES ARE REQUIRED. Performed at [facility], [place] [address], [place], [place] [address]    Report Status 04/13/2021 FINAL  Final  Blood Culture ID Panel (Reflexed)     Status: Abnormal   Collection Time: 04/10/21 11:29 PM  Result Value Ref Range Status   Enterococcus faecalis NOT DETECTED NOT DETECTED Final   Enterococcus Faecium NOT DETECTED NOT DETECTED Final   Listeria monocytogenes NOT DETECTED NOT DETECTED Final   Staphylococcus species DETECTED (A) NOT DETECTED Final    Comment: CRITICAL RESULT CALLED TO, READ BACK BY AND VERIFIED WITH: PHARMD ELLEN JACKSON 04/11/21@23 :48 BY TW    Staphylococcus aureus (BCID) NOT DETECTED NOT DETECTED Final   Staphylococcus epidermidis DETECTED (A) NOT DETECTED Final    Comment: CRITICAL RESULT CALLED TO, READ BACK BY AND VERIFIED WITH: PHARMD ELLEN JACKSON 04/11/21@23 :48 BY TW    Staphylococcus lugdunensis NOT DETECTED NOT DETECTED Final   Streptococcus species NOT DETECTED NOT DETECTED Final   Streptococcus  agalactiae NOT DETECTED NOT DETECTED Final   Streptococcus pneumoniae NOT DETECTED NOT DETECTED Final   Streptococcus pyogenes NOT DETECTED NOT DETECTED Final   A.calcoaceticus-baumannii NOT DETECTED NOT DETECTED Final   Bacteroides fragilis NOT DETECTED NOT DETECTED Final   Enterobacterales NOT DETECTED NOT DETECTED Final   Enterobacter cloacae complex NOT DETECTED NOT DETECTED Final   Escherichia coli NOT DETECTED NOT DETECTED Final   Klebsiella aerogenes NOT DETECTED NOT DETECTED Final   Klebsiella oxytoca NOT DETECTED NOT DETECTED Final   Klebsiella pneumoniae NOT DETECTED NOT DETECTED Final   Proteus species NOT DETECTED NOT DETECTED Final   Salmonella species NOT DETECTED NOT DETECTED Final   Serratia marcescens NOT DETECTED NOT DETECTED Final   Haemophilus influenzae NOT DETECTED NOT DETECTED Final   Neisseria meningitidis NOT DETECTED NOT DETECTED Final   Pseudomonas aeruginosa NOT DETECTED NOT DETECTED Final   Stenotrophomonas maltophilia NOT DETECTED NOT DETECTED Final   Candida albicans NOT DETECTED NOT DETECTED Final   Candida auris NOT DETECTED NOT DETECTED Final   Candida glabrata NOT DETECTED NOT DETECTED Final   Candida krusei NOT DETECTED NOT  DETECTED Final   Candida parapsilosis NOT DETECTED NOT DETECTED Final   Candida tropicalis NOT DETECTED NOT DETECTED Final   Cryptococcus neoformans/gattii NOT DETECTED NOT DETECTED Final   Methicillin resistance mecA/C NOT DETECTED NOT DETECTED Final    Comment: Performed at [facility], [place] [address] [place], [place] [address]  Culture, blood (Routine x 2)     Status: None (Preliminary result)   Collection Time: 04/10/21 11:31 PM   Specimen: BLOOD  Result Value Ref Range Status   Specimen Description   Final    BLOOD LEFT ANTECUBITAL Performed at [facility] [address] [place], [place] [address]    Special Requests   Final    BOTTLES DRAWN AEROBIC AND ANAEROBIC Blood Culture adequate  volume Performed at [facility] [address] [place], [place] [address]    Culture   Final    NO GROWTH 2 DAYS Performed at [facility] [address], [place], [place] [address]    Report Status PENDING  Incomplete  Resp Panel by RT-PCR (Flu A&B, Covid) Nasopharyngeal Swab     Status: Abnormal   Collection Time: 04/10/21 11:31 PM   Specimen: Nasopharyngeal Swab; Nasopharyngeal(NP) swabs in vial transport medium  Result Value Ref Range Status   SARS Coronavirus 2 by RT PCR NEGATIVE NEGATIVE Final    Comment: (NOTE) SARS-CoV-2 target nucleic acids are NOT DETECTED.  The SARS-CoV-2 RNA is generally detectable in upper respiratory specimens during the acute phase of infection. The lowest concentration of SARS-CoV-2 viral copies this assay can detect is 138 copies/mL. A negative result does not preclude SARS-Cov-2 infection and should not be used as the sole basis for treatment or other patient management decisions. A negative result may occur with  improper specimen collection/handling, submission of specimen other than nasopharyngeal swab, presence of viral mutation(s) within the areas targeted by this assay, and inadequate number of viral copies(<138 copies/mL). A negative result must be combined with clinical observations, patient history, and epidemiological information. The expected result is Negative.  Fact Sheet for Patients:  EntrepreneurPulse.com.au  Fact Sheet for Healthcare Providers:  IncredibleEmployment.be  This test is no t yet approved or cleared by the [place] FDA and  has been authorized for detection and/or diagnosis of SARS-CoV-2 by FDA under an Emergency Use Authorization (EUA). This EUA will remain  in effect (meaning this test can be used) for the duration of the COVID-19 declaration under Section 564(b)(1) of the Act, 21 U.S.C.section 360bbb-3(b)(1), unless the authorization is  terminated  or revoked sooner.       Influenza A by PCR POSITIVE (A) NEGATIVE Final   Influenza B by PCR NEGATIVE NEGATIVE Final    Comment: (NOTE) The Xpert Xpress SARS-CoV-2/FLU/RSV plus assay is intended as an aid in the diagnosis of influenza from Nasopharyngeal swab specimens and should not be used as a sole basis for treatment. Nasal washings and aspirates are unacceptable for Xpert Xpress SARS-CoV-2/FLU/RSV testing.  Fact Sheet for Patients: EntrepreneurPulse.com.au  Fact Sheet for Healthcare Providers: IncredibleEmployment.be  This test is not yet approved or cleared by the [place] FDA and has been authorized for detection and/or diagnosis of SARS-CoV-2 by FDA under an Emergency Use Authorization (EUA). This EUA will remain in effect (meaning this test can be used) for the duration of the COVID-19 declaration under Section 564(b)(1) of the Act, 21 U.S.C. section 360bbb-3(b)(1), unless the authorization is terminated or revoked.  Performed at [facility],  [place] [address] [place], [place] [address]    Recent Labs  Lab 04/10/21 2321 04/11/21 0819  WBC 8.8 11.8*  NEUTROABS 8.0*  --   HGB 12.9* 13.8  HCT 38.3* 41.3  MCV 97.0 97.4  PLT 194 190   Recent Labs  Lab 04/10/21 2321  NA 130*  K 3.9  CL 94*  CO2 27  GLUCOSE 101*  BUN 14  CREATININE 1.01  CALCIUM 8.6*   Recent Labs  Lab 04/10/21 2321  AST 22  ALT 12  ALKPHOS 60  BILITOT 1.1  PROT 6.6  ALBUMIN 3.9   No results for input(s): GLUCAP in the last 168 hours.  Author: Berle Mull [facility]

## 2021-04-16 LAB — CULTURE, BLOOD (ROUTINE X 2)
Culture: NO GROWTH
Special Requests: ADEQUATE

## 2021-05-12 DIAGNOSIS — E1165 Type 2 diabetes mellitus with hyperglycemia: Secondary | ICD-10-CM | POA: Diagnosis not present

## 2021-05-12 DIAGNOSIS — I700 Atherosclerosis of aorta: Secondary | ICD-10-CM | POA: Diagnosis not present

## 2021-05-12 DIAGNOSIS — I10 Essential (primary) hypertension: Secondary | ICD-10-CM | POA: Diagnosis not present

## 2021-05-12 DIAGNOSIS — E7800 Pure hypercholesterolemia, unspecified: Secondary | ICD-10-CM | POA: Diagnosis not present

## 2021-05-12 DIAGNOSIS — Z23 Encounter for immunization: Secondary | ICD-10-CM | POA: Diagnosis not present

## 2021-05-12 DIAGNOSIS — Z0000 Encounter for general adult medical examination without abnormal findings: Secondary | ICD-10-CM | POA: Diagnosis not present

## 2021-05-12 DIAGNOSIS — Z8501 Personal history of malignant neoplasm of esophagus: Secondary | ICD-10-CM | POA: Diagnosis not present

## 2021-05-12 DIAGNOSIS — R634 Abnormal weight loss: Secondary | ICD-10-CM | POA: Diagnosis not present

## 2021-05-28 DIAGNOSIS — R3915 Urgency of urination: Secondary | ICD-10-CM | POA: Diagnosis not present

## 2021-06-04 DIAGNOSIS — E119 Type 2 diabetes mellitus without complications: Secondary | ICD-10-CM | POA: Diagnosis not present

## 2021-06-10 DIAGNOSIS — E118 Type 2 diabetes mellitus with unspecified complications: Secondary | ICD-10-CM | POA: Diagnosis not present

## 2021-06-10 DIAGNOSIS — I10 Essential (primary) hypertension: Secondary | ICD-10-CM | POA: Diagnosis not present

## 2021-06-10 DIAGNOSIS — R634 Abnormal weight loss: Secondary | ICD-10-CM | POA: Diagnosis not present

## 2021-06-24 DIAGNOSIS — M9903 Segmental and somatic dysfunction of lumbar region: Secondary | ICD-10-CM | POA: Diagnosis not present

## 2021-06-24 DIAGNOSIS — M9905 Segmental and somatic dysfunction of pelvic region: Secondary | ICD-10-CM | POA: Diagnosis not present

## 2021-06-24 DIAGNOSIS — M9904 Segmental and somatic dysfunction of sacral region: Secondary | ICD-10-CM | POA: Diagnosis not present

## 2021-06-24 DIAGNOSIS — M5136 Other intervertebral disc degeneration, lumbar region: Secondary | ICD-10-CM | POA: Diagnosis not present

## 2021-07-13 DIAGNOSIS — R3915 Urgency of urination: Secondary | ICD-10-CM | POA: Diagnosis not present

## 2021-08-11 DIAGNOSIS — R051 Acute cough: Secondary | ICD-10-CM | POA: Diagnosis not present

## 2021-12-09 ENCOUNTER — Other Ambulatory Visit: Payer: Self-pay | Admitting: Infectious Diseases

## 2021-12-09 DIAGNOSIS — R600 Localized edema: Secondary | ICD-10-CM

## 2021-12-10 ENCOUNTER — Ambulatory Visit
Admission: RE | Admit: 2021-12-10 | Discharge: 2021-12-10 | Disposition: A | Discharge status: Home / Self Care | Payer: Medicare Other | Source: Ambulatory Visit | Attending: Infectious Diseases | Admitting: Infectious Diseases

## 2021-12-10 DIAGNOSIS — R600 Localized edema: Secondary | ICD-10-CM

## 2022-02-14 NOTE — Progress Notes (Unsigned)
    02/14/2022 Oscar [name] [id number] 18-Dec-1932  Referring provider: Deland Pretty, MD Primary GI doctor: Dr. Henrene Pastor  ASSESSMENT AND PLAN:   Assessment: 88 y.o. male here for assessment of the following: No diagnosis found.  Plan:  No orders of the defined types were placed in this encounter.   No orders of the defined types were placed in this encounter.   History of Present Illness:  88 y.o. male  with a past medical history of hypertension, aortic insufficiency, chronic respiratory failure, history of esophageal cancer 2001 status post esophagectomy, small tongue residual, Knerr mucosa possibly Barrett's, history of anastomotic stricturing, constipation, cholelithiasis,, history of GERD, SBO secondary adhesions underwent laparoscopic lysis of adhesions 12/27/2019 and others listed below, returns to clinic today for evaluation of ***. 2015 upper endoscopy Evidence of prior distal esophageal resection with gastroenteric anastomosis at 25 cm, small tongue of columnar type mucosa, postsurgical stomach normal as was the duodenum.  Biopsy showed Barrett's esophagus, no dysplasia or malignancy identified. ***  He  reports that he has been smoking cigars and pipe. He has never used smokeless tobacco. He reports current alcohol use of about 7.0 - 14.0 standard drinks of alcohol per week. He reports that he does not use drugs. His family history includes CAD in his mother; Diabetes in his mother; Stroke in his sister.   Current Medications:    Current Outpatient Medications (Cardiovascular):    atenolol (TENORMIN) 25 MG tablet, Take 25 mg by mouth daily.   losartan-hydrochlorothiazide (HYZAAR) 100-12.5 MG tablet, Take 1 tablet by mouth daily.    Current Outpatient Medications (Hematological):    vitamin B-12 (CYANOCOBALAMIN) 1000 MCG tablet, Take 1,000 mcg by mouth daily.  Current Outpatient Medications (Other):    azithromycin (ZITHROMAX) 500 MG tablet, Take 1 tablet (500 mg  total) by mouth daily.   feeding supplement (ENSURE ENLIVE / ENSURE PLUS) LIQD, Take 237 mLs by mouth 2 (two) times daily between meals.   finasteride (PROSCAR) 5 MG tablet, Take 5 mg by mouth daily.   methocarbamol (ROBAXIN) 500 MG tablet, Take 1 tablet (500 mg total) by mouth every 8 (eight) hours as needed for muscle spasms.   Misc Natural Products (PROSTATE HEALTH) CAPS, Take 1 capsule by mouth daily. Saw Palmetto   omeprazole (PRILOSEC) 20 MG capsule, Take 40 mg by mouth daily.   polyethylene glycol powder (GLYCOLAX/MIRALAX) 17 GM/SCOOP powder, Take 17 g by mouth daily.  Surgical History:  He  has a past surgical history that includes bilateral olecranon bursal excisions; Hemorrhoid surgery; Vasectomy; transthoracic echocardiogram (2011); NM MYOCAR PERF WALL MOTION (2011); Esophagectomy (2001); and laparoscopy (N/A, 12/27/2019).  Current Medications, Allergies, Past Medical History, Past Surgical History, Family History and Social History were reviewed in [employer] record.  Physical Exam: There were no vitals taken for this visit. General:   Pleasant, well developed male in no acute distress Heart : Regular rate and rhythm; no murmurs Pulm: Clear anteriorly; no wheezing Abdomen:  {BlankSingle:19197::"Distended","Ridged","Soft"}, {BlankSingle:19197::"Flat","Obese","Non-distended"} AB, {BlankSingle:19197::"Absent","Hyperactive, tinkling","Hypoactive","Sluggish","Active"} bowel sounds. {actendernessAB:27319} tenderness {anatomy; site abdomen:5010}. {BlankMultiple:19196::"Without guarding","With guarding","Without rebound","With rebound"}, No organomegaly appreciated. Rectal: {acrectalexam:27461} Extremities:  {With/without:5700}  edema. Neurologic:  Alert and  oriented x4;  No focal deficits.  Psych:  Cooperative. Normal mood and affect.   Vladimir Crofts, PA-C 02/14/22

## 2022-02-16 ENCOUNTER — Ambulatory Visit: Payer: Medicare Other | Admitting: Physician Assistant

## 2022-02-16 ENCOUNTER — Other Ambulatory Visit (INDEPENDENT_AMBULATORY_CARE_PROVIDER_SITE_OTHER): Payer: Medicare Other

## 2022-02-16 ENCOUNTER — Encounter: Payer: Self-pay | Admitting: Physician Assistant

## 2022-02-16 ENCOUNTER — Ambulatory Visit (INDEPENDENT_AMBULATORY_CARE_PROVIDER_SITE_OTHER)
Admission: RE | Admit: 2022-02-16 | Discharge: 2022-02-16 | Disposition: A | Discharge status: Home / Self Care | Payer: Medicare Other | Source: Ambulatory Visit | Attending: Physician Assistant | Admitting: Physician Assistant

## 2022-02-16 ENCOUNTER — Other Ambulatory Visit: Payer: Self-pay

## 2022-02-16 VITALS — BP 110/72 | HR 57 | Ht 73.5 in | Wt 150.0 lb

## 2022-02-16 DIAGNOSIS — Z8501 Personal history of malignant neoplasm of esophagus: Secondary | ICD-10-CM

## 2022-02-16 DIAGNOSIS — R1084 Generalized abdominal pain: Secondary | ICD-10-CM

## 2022-02-16 DIAGNOSIS — K56609 Unspecified intestinal obstruction, unspecified as to partial versus complete obstruction: Secondary | ICD-10-CM

## 2022-02-16 DIAGNOSIS — E871 Hypo-osmolality and hyponatremia: Secondary | ICD-10-CM

## 2022-02-16 DIAGNOSIS — R634 Abnormal weight loss: Secondary | ICD-10-CM | POA: Diagnosis not present

## 2022-02-16 DIAGNOSIS — K5904 Chronic idiopathic constipation: Secondary | ICD-10-CM

## 2022-02-16 DIAGNOSIS — C787 Secondary malignant neoplasm of liver and intrahepatic bile duct: Secondary | ICD-10-CM

## 2022-02-16 DIAGNOSIS — R053 Chronic cough: Secondary | ICD-10-CM

## 2022-02-16 DIAGNOSIS — C801 Malignant (primary) neoplasm, unspecified: Secondary | ICD-10-CM

## 2022-02-16 DIAGNOSIS — K629 Disease of anus and rectum, unspecified: Secondary | ICD-10-CM

## 2022-02-16 DIAGNOSIS — K2270 Barrett's esophagus without dysplasia: Secondary | ICD-10-CM

## 2022-02-16 DIAGNOSIS — R935 Abnormal findings on diagnostic imaging of other abdominal regions, including retroperitoneum: Secondary | ICD-10-CM

## 2022-02-16 LAB — CBC WITH DIFFERENTIAL/PLATELET
Basophils Absolute: 0 10*3/uL (ref 0.0–0.1)
Basophils Relative: 0.8 % (ref 0.0–3.0)
Eosinophils Absolute: 0.1 10*3/uL (ref 0.0–0.7)
Eosinophils Relative: 1.4 % (ref 0.0–5.0)
HCT: 35.6 % — ABNORMAL LOW (ref 39.0–52.0)
Hemoglobin: 12.3 g/dL — ABNORMAL LOW (ref 13.0–17.0)
Lymphocytes Relative: 21.1 % (ref 12.0–46.0)
Lymphs Abs: 1.1 10*3/uL (ref 0.7–4.0)
MCHC: 34.5 g/dL (ref 30.0–36.0)
MCV: 97.4 fl (ref 78.0–100.0)
Monocytes Absolute: 0.5 10*3/uL (ref 0.1–1.0)
Monocytes Relative: 8.8 % (ref 3.0–12.0)
Neutro Abs: 3.7 10*3/uL (ref 1.4–7.7)
Neutrophils Relative %: 67.9 % (ref 43.0–77.0)
Platelets: 251 10*3/uL (ref 150.0–400.0)
RBC: 3.65 Mil/uL — ABNORMAL LOW (ref 4.22–5.81)
RDW: 12.4 % (ref 11.5–15.5)
WBC: 5.4 10*3/uL (ref 4.0–10.5)

## 2022-02-16 LAB — COMPREHENSIVE METABOLIC PANEL
ALT: 10 U/L (ref 0–53)
AST: 18 U/L (ref 0–37)
Albumin: 4.1 g/dL (ref 3.5–5.2)
Alkaline Phosphatase: 76 U/L (ref 39–117)
BUN: 13 mg/dL (ref 6–23)
CO2: 30 mEq/L (ref 19–32)
Calcium: 9.2 mg/dL (ref 8.4–10.5)
Chloride: 89 mEq/L — ABNORMAL LOW (ref 96–112)
Creatinine, Ser: 0.95 mg/dL (ref 0.40–1.50)
GFR: 72.22 mL/min (ref >60.00)
Glucose, Bld: 127 mg/dL — ABNORMAL HIGH (ref 70–99)
Potassium: 4.4 mEq/L (ref 3.5–5.1)
Sodium: 124 mEq/L — ABNORMAL LOW (ref 135–145)
Total Bilirubin: 1 mg/dL (ref 0.2–1.2)
Total Protein: 7 g/dL (ref 6.0–8.3)

## 2022-02-16 NOTE — Patient Instructions (Addendum)
Can do protein powder.   Please go to the basement and get labs and an a chest x-ray today.  You have been scheduled for a CT scan of the abdomen and pelvis at [facility], 1st floor Radiology. You are scheduled on 02/21/2022 at 11:30am. You should arrive 30 minutes prior to your appointment time for registration.  We are giving you 2 bottles of contrast today that you will need to drink before arriving for the exam. The solution may taste better if refrigerated so put them in the refrigerator when you get home, but do NOT add ice or any other liquid to this solution as that would dilute it. Shake well before drinking.   Please follow the written instructions below on the day of your exam:   1) Do not eat anything after 7:30am (4 hours prior to your test)   2) Drink 1 bottle of contrast @ 9:30am (2 hours prior to your exam)  Remember to shake well before drinking and do NOT pour over ice.     Drink 1 bottle of contrast @ 10:30am (1 hour prior to your exam)   You may take any medications as prescribed with a small amount of water, if necessary. If you take any of the following medications: METFORMIN, GLUCOPHAGE, GLUCOVANCE, AVANDAMET, RIOMET, FORTAMET, [place] MET, JANUMET, GLUMETZA or METAGLIP, you MAY be asked to HOLD this medication 48 hours AFTER the exam.   The purpose of you drinking the oral contrast is to aid in the visualization of your intestinal tract. The contrast solution may cause some diarrhea. Depending on your individual set of symptoms, you may also receive an intravenous injection of x-ray contrast/dye. Plan on being at [facility] for 45 minutes or longer, depending on the type of exam you are having performed.   If you have any questions regarding your exam or if you need to reschedule, you may call [facility] at [phone] between the hours of 8:00 am and 5:00 pm, Monday-Friday.   I appreciate the opportunity to care for you. Vicie Mutters, PA-C

## 2022-02-16 NOTE — Progress Notes (Signed)
Assessment and plan noted ?

## 2022-02-18 ENCOUNTER — Other Ambulatory Visit (INDEPENDENT_AMBULATORY_CARE_PROVIDER_SITE_OTHER): Payer: Medicare Other

## 2022-02-18 DIAGNOSIS — E871 Hypo-osmolality and hyponatremia: Secondary | ICD-10-CM

## 2022-02-18 DIAGNOSIS — R1084 Generalized abdominal pain: Secondary | ICD-10-CM

## 2022-02-18 LAB — COMPREHENSIVE METABOLIC PANEL
ALT: 11 U/L (ref 0–53)
AST: 20 U/L (ref 0–37)
Albumin: 4.3 g/dL (ref 3.5–5.2)
Alkaline Phosphatase: 77 U/L (ref 39–117)
BUN: 10 mg/dL (ref 6–23)
CO2: 29 mEq/L (ref 19–32)
Calcium: 9.2 mg/dL (ref 8.4–10.5)
Chloride: 90 mEq/L — ABNORMAL LOW (ref 96–112)
Creatinine, Ser: 0.93 mg/dL (ref 0.40–1.50)
GFR: 74.09 mL/min (ref >60.00)
Glucose, Bld: 104 mg/dL — ABNORMAL HIGH (ref 70–99)
Potassium: 4 mEq/L (ref 3.5–5.1)
Sodium: 124 mEq/L — ABNORMAL LOW (ref 135–145)
Total Bilirubin: 0.8 mg/dL (ref 0.2–1.2)
Total Protein: 7.1 g/dL (ref 6.0–8.3)

## 2022-02-21 ENCOUNTER — Ambulatory Visit (HOSPITAL_COMMUNITY)
Admission: RE | Admit: 2022-02-21 | Discharge: 2022-02-21 | Disposition: A | Discharge status: Home / Self Care | Payer: Medicare Other | Source: Ambulatory Visit | Attending: Physician Assistant | Admitting: Physician Assistant

## 2022-02-21 ENCOUNTER — Encounter (HOSPITAL_COMMUNITY): Payer: Self-pay

## 2022-02-21 DIAGNOSIS — R1084 Generalized abdominal pain: Secondary | ICD-10-CM | POA: Insufficient documentation

## 2022-02-21 MED ORDER — SODIUM CHLORIDE (PF) 0.9 % IJ SOLN
INTRAMUSCULAR | Status: AC
  Filled 2022-02-21: qty 50

## 2022-02-21 MED ORDER — IOHEXOL 300 MG/ML  SOLN
100.0000 mL | Freq: Once | INTRAMUSCULAR | Status: AC | PRN
  Administered 2022-02-21: 100 mL via INTRAVENOUS

## 2022-02-22 NOTE — Addendum Note (Signed)
Addended by: Vicie Mutters on: 02/22/2022 03:13 PM   Modules accepted: Orders

## 2022-02-24 ENCOUNTER — Other Ambulatory Visit: Payer: Self-pay

## 2022-02-24 DIAGNOSIS — R634 Abnormal weight loss: Secondary | ICD-10-CM

## 2022-03-18 ENCOUNTER — Ambulatory Visit (HOSPITAL_COMMUNITY)
Admission: RE | Admit: 2022-03-18 | Discharge: 2022-03-18 | Disposition: A | Discharge status: Home / Self Care | Payer: Medicare Other | Source: Ambulatory Visit | Attending: Physician Assistant | Admitting: Physician Assistant

## 2022-03-18 DIAGNOSIS — R634 Abnormal weight loss: Secondary | ICD-10-CM | POA: Insufficient documentation

## 2022-03-18 DIAGNOSIS — R935 Abnormal findings on diagnostic imaging of other abdominal regions, including retroperitoneum: Secondary | ICD-10-CM | POA: Insufficient documentation

## 2022-03-18 DIAGNOSIS — R053 Chronic cough: Secondary | ICD-10-CM | POA: Insufficient documentation

## 2022-03-18 DIAGNOSIS — K629 Disease of anus and rectum, unspecified: Secondary | ICD-10-CM | POA: Diagnosis present

## 2022-03-18 DIAGNOSIS — C801 Malignant (primary) neoplasm, unspecified: Secondary | ICD-10-CM | POA: Insufficient documentation

## 2022-03-18 DIAGNOSIS — C787 Secondary malignant neoplasm of liver and intrahepatic bile duct: Secondary | ICD-10-CM | POA: Insufficient documentation

## 2022-03-18 MED ORDER — IOHEXOL 300 MG/ML  SOLN
75.0000 mL | Freq: Once | INTRAMUSCULAR | Status: AC | PRN
  Administered 2022-03-18: 75 mL via INTRAVENOUS

## 2022-03-18 MED ORDER — SODIUM CHLORIDE (PF) 0.9 % IJ SOLN
INTRAMUSCULAR | Status: AC
  Filled 2022-03-18: qty 50

## 2022-03-18 MED ORDER — GADOBUTROL 1 MMOL/ML IV SOLN
7.0000 mL | Freq: Once | INTRAVENOUS | Status: AC | PRN
  Administered 2022-03-18: 7 mL via INTRAVENOUS

## 2022-03-29 ENCOUNTER — Telehealth: Payer: Self-pay | Admitting: Physician Assistant

## 2022-03-29 NOTE — Telephone Encounter (Signed)
Spoke with patient's daughter & she stated PCP office did not receive CT/MRI results. Will refax again. She is going to check in with office to see if they have received it next week.

## 2022-03-29 NOTE — Telephone Encounter (Signed)
Patients daughter called to get CT results.

## 2022-04-21 ENCOUNTER — Telehealth: Payer: Self-pay | Admitting: Physical Medicine and Rehabilitation

## 2022-04-21 NOTE — Telephone Encounter (Signed)
Pt walked in requesting appt for epidural injection in lower back with Dr. Ernestina Patches... Pt requesting callback at [phone]

## 2022-04-22 NOTE — Telephone Encounter (Signed)
Spoke with patient and schedule OV for 04/26/22

## 2022-04-26 ENCOUNTER — Encounter: Payer: Self-pay | Admitting: Physical Medicine and Rehabilitation

## 2022-04-26 ENCOUNTER — Ambulatory Visit: Payer: Medicare Other | Admitting: Physical Medicine and Rehabilitation

## 2022-04-26 ENCOUNTER — Telehealth: Payer: Self-pay | Admitting: Physical Medicine and Rehabilitation

## 2022-04-26 DIAGNOSIS — M47816 Spondylosis without myelopathy or radiculopathy, lumbar region: Secondary | ICD-10-CM | POA: Diagnosis not present

## 2022-04-26 DIAGNOSIS — M48062 Spinal stenosis, lumbar region with neurogenic claudication: Secondary | ICD-10-CM

## 2022-04-26 DIAGNOSIS — M5416 Radiculopathy, lumbar region: Secondary | ICD-10-CM | POA: Diagnosis not present

## 2022-04-26 DIAGNOSIS — M4726 Other spondylosis with radiculopathy, lumbar region: Secondary | ICD-10-CM | POA: Diagnosis not present

## 2022-04-26 NOTE — Progress Notes (Unsigned)
Functional Pain Scale - descriptive words and definitions  Moderate (4)   Constantly aware of pain, can complete ADLs with modification/sleep marginally affected at times/passive distraction is of no use, but active distraction gives some relief. Moderate range order  Average Pain 6  Lower back pain across the back. No radiation

## 2022-04-26 NOTE — Telephone Encounter (Signed)
Pt called requesting a call back from [facility] about records he states someone from his office is asking for. I am confused of what he is trying to state. Pt has an appt today. Please call pt at [phone].

## 2022-04-26 NOTE — Progress Notes (Signed)
MCLANE ARORA - 88 y.o. male MRN [id number]  Date of birth: August 24, 1932  Office Visit Note: Visit Date: 04/26/2022 PCP: Deland Pretty, MD Referred by: Deland Pretty, MD  Subjective: Chief Complaint  Patient presents with   Lower Back - Pain   HPI: CASTULO SCARPELLI is a 88 y.o. male who comes in today for evaluation of chronic, worsening and severe bilateral lower back pain with intermittent radiation to hips and buttocks. Patient is well known to [place]. Patent is somewhat of a poor historian. Pain ongoing for several years and worsens with prolonged standing. He describes pain as sore and aching, currently rates as 8 out of 10. Some relief of pain with home exercise regimen, rest and use of medications. Has taken Norco in the past with good relief. States he does ride stationary bike at home multiple times a week. Lumbar MRI imaging from 2018 exhibits multi level facet arthropathy, most severe at L4-L5 and L5-S1, severe multifactorial spinal canal stenosis at the level of L4-L5. History of multiple lumbar epidural steroid injections performed in our office over the years with good relief of pain. Most recent left L5-S1 interlaminar epidural steroid injection on 11/17/20 helped significantly for several months. Patient is inquiring about pain medication and possible repeat injection today. Patient ambulates with cane. Patient denies focal weakness, numbness and tingling. Patient denies recent trauma or falls.    Review of Systems  Musculoskeletal:  Positive for back pain.  Neurological:  Negative for tingling, sensory change, focal weakness and weakness.  All other systems reviewed and are negative.  Otherwise per HPI.  Assessment & Plan: Visit Diagnoses:    ICD-10-CM   1. Lumbar radiculopathy  M54.16 DG INJECT DIAG/THERA/INC NEEDLE/CATH/PLC EPI/LUMB/SAC W/IMG    Ambulatory referral to Pain Clinic    2. Spinal stenosis of lumbar region with neurogenic claudication  M48.062 DG INJECT  DIAG/THERA/INC NEEDLE/CATH/PLC EPI/LUMB/SAC W/IMG    Ambulatory referral to Pain Clinic    3. Other spondylosis with radiculopathy, lumbar region  M47.26 DG INJECT DIAG/THERA/INC NEEDLE/CATH/PLC EPI/LUMB/SAC W/IMG    Ambulatory referral to Pain Clinic    4. Facet arthropathy, lumbar  M47.816 DG INJECT DIAG/THERA/INC NEEDLE/CATH/PLC EPI/LUMB/SAC W/IMG    Ambulatory referral to Pain Clinic       Plan: Findings:  Chronic, worsening and severe bilateral lower back pain with intermittent radiation to hips and buttock.  Patient continues to have severe pain despite good conservative therapy such as home exercise regimen, rest and use of medications.  Patient's clinical presentation and exam are consistent with neurogenic claudication as result of spinal canal stenosis.  Lumbar MRI imaging from 2018 exhibits severe spinal canal stenosis at the level of L4-L5. Previous lumbar epidural steroid injections were beneficial in alleviating his pain long term. Next step is to repeat left L5-S1 interlaminar epidural steroid injection under fluoroscopic guidance. Patient would like to get this injection as quick as possible. I will place order for injection at [facility].   We will proceed with injection, however patient seems to be more interested in chronic pain management at this time. Has taken Norco in the past with good relief of pain, was prescribed by Dr. Eunice Blase in our practice who is no longer with [place]. I talked with him about medication management and explained to him that we do not manage chronic pain with opioid medications in our office. I feel he would benefit from referral to chronic pain management practice. I placed referral to Dr. Kellie Shropshire at [facility] today. No red flag symptoms noted upon exam today.     Meds & Orders: No orders of the defined types were placed in this encounter.   Orders Placed This Encounter  Procedures   DG INJECT DIAG/THERA/INC NEEDLE/CATH/PLC  EPI/LUMB/SAC W/IMG   Ambulatory referral to Pain Clinic    Follow-up: Return for Left L5-S1 interlaminar epidural steroid injection at [facility].   Procedures: No procedures performed      Clinical History: EXAM: MRI LUMBAR SPINE WITHOUT CONTRAST   TECHNIQUE: Multiplanar, multisequence MR imaging of the lumbar spine was performed. No intravenous contrast was administered.   COMPARISON:  02/02/2013   FINDINGS: Segmentation:  Standard.   Alignment: Mild lumbar levoscoliosis. 6 mm anterolisthesis of L4 on L5, facet mediated and unchanged. Trace anterolisthesis of L5 on S1.   Vertebrae: Chronic mild diffuse bone marrow signal heterogeneity. No fracture or destructive osseous lesion. New small L3 inferior endplate Schmorl's node with minimal surrounding edema.   Conus medullaris: Extends to the T12 level and appears normal.   Paraspinal and other soft tissues: 1.5 cm T2 hyperintense lesion in the interpolar left kidney, most compatible with a cyst and slightly enlarged in the interim.   Disc levels:   Disc desiccation throughout the lumbar spine with relative exception of L3-4. Mild disc space narrowing at L4-5.   T11-12: Mild disc bulging results in mild bilateral neural foraminal stenosis without spinal stenosis.   T12-L1:  Mild facet arthrosis without stenosis.   L1-2: Mild disc bulging has slightly progressed and, along with mild-to-moderate facet hypertrophy results in borderline bilateral lateral recess stenosis. No spinal or neural foraminal stenosis.   L2-3: Mild circumferential disc bulging and moderate facet and ligamentum flavum hypertrophy result in borderline bilateral lateral recess and borderline bilateral neural foraminal stenosis, overall stable to minimally progressed. No spinal stenosis.   L3-4: Disc bulging and moderate facet and ligamentum flavum hypertrophy result in moderate bilateral lateral recess stenosis and moderate right and mild left  neural foraminal stenosis, slightly progressed from prior. Potential bilateral L4 nerve root impingement in the lateral recesses.   L4-5: Anterolisthesis with bulging uncovered disc and severe facet and ligamentum flavum hypertrophy result in severe spinal stenosis, severe bilateral lateral recess stenosis, and moderate bilateral neural foraminal stenosis, mildly progressed from prior. Bilateral L5 nerve root impingement in the lateral recesses.   L5-S1: Anterolisthesis with bulging uncovered disc, foraminal endplate spurring, ligamentum flavum thickening, and severe facet arthrosis result in mild right and mild-to-moderate left lateral recess stenosis and moderate left greater than right neural foraminal stenosis, overall stable to minimally progressed. No spinal stenosis.   IMPRESSION: 1. Severe multifactorial spinal stenosis at L4-5, mildly progressed from 2014. 2. Stable to minimally progressive disc and facet degeneration elsewhere as above.     Electronically Signed   By: Logan Bores M.D.   On: 02/06/2017 13:45   He reports that he has been smoking cigars and pipe. He has never used smokeless tobacco. No results for input(s): "HGBA1C", "LABURIC" in the last 8760 hours.  Objective:  VS:  HT:    WT:   BMI:     BP:   HR: bpm  TEMP: ( )  RESP:  Physical Exam Vitals and nursing note reviewed.  HENT:     Head: Normocephalic and atraumatic.     Right Ear: External ear normal.     Left Ear: External ear normal.     Nose: Nose normal.     Mouth/Throat:  Mouth: Mucous membranes are moist.  Eyes:     Extraocular Movements: Extraocular movements intact.  Cardiovascular:     Rate and Rhythm: Normal rate.     Pulses: Normal pulses.  Pulmonary:     Effort: Pulmonary effort is normal.  Abdominal:     General: Abdomen is flat. There is no distension.  Musculoskeletal:        General: Tenderness present.     Cervical back: Normal range of motion.     Comments: Pt is  slow to rise from seated position to standing. Good lumbar range of motion. Strong distal strength without clonus, no pain upon palpation of greater trochanters. Sensation intact bilaterally. Ambulates with cane, gait slow and unsteady.  Skin:    General: Skin is warm and dry.     Capillary Refill: Capillary refill takes less than 2 seconds.  Neurological:     Mental Status: He is alert.     Gait: Gait abnormal.  Psychiatric:        Mood and Affect: Mood normal.        Behavior: Behavior normal.     Ortho Exam  Imaging: No results found.  Past Medical/Family/Surgical/Social History: Medications & Allergies reviewed per EMR, new medications updated. Patient Active Problem List   Diagnosis Date Noted   Sepsis ([place]) 04/11/2021   Influenza A 04/11/2021   Hyponatremia 12/27/2019   Hyperkalemia 12/26/2019   ARF (acute renal failure) ([place]) 12/26/2019   History of esophagectomy 12/26/2019   Tobacco abuse 10/19/2018   BPH (benign prostatic hyperplasia) 10/19/2018   AKI (acute kidney injury) ([place])    Acute on chronic respiratory failure with hypoxia ([place]) 11/15/2017   Malnutrition of moderate degree 02/15/2017   Leukocytosis 02/12/2017   OSA (obstructive sleep apnea) 02/12/2017   Dehydration 02/12/2017   SBO (small bowel obstruction) - recurrent 10/31/2016   Aortic insufficiency 07/09/2013   HTN (hypertension) 07/09/2013   GERD 07/13/2009   DYSPHAGIA 07/13/2009   FLATULENCE-GAS-BLOATING 07/13/2009   History of esophageal cancer 2001 07/18/1999   Past Medical History:  Diagnosis Date   Acid reflux    Barrett's esophagus    BPH (benign prostatic hyperplasia)    Colon polyps    DDD (degenerative disc disease)    Diabetes mellitus    controlled with diet and exercise   ED (erectile dysfunction)    Esophageal cancer ([place]) 2001   tx'd surgery - abdominal & right posterior chest approach (2001) - [place], [place]   First degree AV block    Gout    Hemorrhoids    History of  palpitations    Hyperlipidemia    Hypertension    Lipoma    Obesity    Osteoarthritis    Small bowel obstruction ([facility])    Tinea versicolor    Tinnitus    Family History  Problem Relation Age of Onset   Diabetes Mother    CAD Mother    Stroke Sister    Colon cancer Neg Hx    Esophageal cancer Neg Hx    Rectal cancer Neg Hx    Stomach cancer Neg Hx    Past Surgical History:  Procedure Laterality Date   bilateral olecranon bursal excisions     ESOPHAGECTOMY  2001   Sebree Thoraicic/Abd approach.  High Grade Dysplasia w esophageal CA   HEMORRHOID SURGERY     LAPAROSCOPY N/A 12/27/2019   Procedure: LAPAROSCOPY DIAGNOSTI; LYSIS OF ADHESIONS;  Surgeon: Michael Boston, MD;  Location: [facility];  Service: General;  Laterality: N/A;   NM MYOCAR PERF WALL MOTION  2011   dipyridamole myoview - stress images show medium in size, moderate in intensity perfusion defect in basal inferior & mid inferior walls with mild defect reversibility at rest, EF 64%   TRANSTHORACIC ECHOCARDIOGRAM  2011   EF=>55%, mild mitral annular calcif, mild calcif of MV apparatus, mild TR, normal RSVP, mild AV regurg, aortic root sclerosis/calcifiication   VASECTOMY     Social History   Occupational History   Occupation: self employed  Tobacco Use   Smoking status: Some Days    Types: Cigars, Pipe   Smokeless tobacco: Never   Tobacco comments:    2-3x/daily, sometimes not at all , tobacco info given 08/01/13  Vaping Use   Vaping Use: Never used  Substance and Sexual Activity   Alcohol use: Yes    Alcohol/week: 7.0 - 14.0 standard drinks of alcohol    Types: 7 - 14 Standard drinks or equivalent per week   Drug use: No   Sexual activity: Not on file

## 2022-04-27 ENCOUNTER — Other Ambulatory Visit: Payer: Self-pay | Admitting: Physical Medicine and Rehabilitation

## 2022-04-27 DIAGNOSIS — M5416 Radiculopathy, lumbar region: Secondary | ICD-10-CM

## 2022-04-27 DIAGNOSIS — M4726 Other spondylosis with radiculopathy, lumbar region: Secondary | ICD-10-CM

## 2022-04-27 DIAGNOSIS — M47816 Spondylosis without myelopathy or radiculopathy, lumbar region: Secondary | ICD-10-CM

## 2022-04-27 DIAGNOSIS — M48062 Spinal stenosis, lumbar region with neurogenic claudication: Secondary | ICD-10-CM

## 2022-05-04 ENCOUNTER — Telehealth: Payer: Self-pay | Admitting: Physical Medicine and Rehabilitation

## 2022-05-04 NOTE — Telephone Encounter (Signed)
Patient need an appt for his new instructions from the pain management office. Please advise..[phone]

## 2022-05-06 ENCOUNTER — Telehealth: Payer: Self-pay

## 2022-05-06 NOTE — Telephone Encounter (Signed)
(  12:50 pm) PC SW scheduled and initial palliative care visit with patient. Clinical team will see patient on 05/19/22 @ 11:30 am.

## 2022-05-10 ENCOUNTER — Ambulatory Visit
Admission: RE | Admit: 2022-05-10 | Discharge: 2022-05-10 | Disposition: A | Discharge status: Home / Self Care | Payer: Medicare Other | Source: Ambulatory Visit | Attending: Physical Medicine and Rehabilitation | Admitting: Physical Medicine and Rehabilitation

## 2022-05-10 ENCOUNTER — Other Ambulatory Visit: Payer: Self-pay

## 2022-05-10 DIAGNOSIS — M47816 Spondylosis without myelopathy or radiculopathy, lumbar region: Secondary | ICD-10-CM

## 2022-05-10 DIAGNOSIS — M48062 Spinal stenosis, lumbar region with neurogenic claudication: Secondary | ICD-10-CM

## 2022-05-10 DIAGNOSIS — M4726 Other spondylosis with radiculopathy, lumbar region: Secondary | ICD-10-CM

## 2022-05-10 DIAGNOSIS — M5416 Radiculopathy, lumbar region: Secondary | ICD-10-CM

## 2022-05-10 MED ORDER — IOPAMIDOL (ISOVUE-M 200) INJECTION 41%
1.0000 mL | Freq: Once | INTRAMUSCULAR | Status: AC
  Administered 2022-05-10: 1 mL via EPIDURAL

## 2022-05-10 MED ORDER — METHYLPREDNISOLONE ACETATE 40 MG/ML INJ SUSP (RADIOLOG
80.0000 mg | Freq: Once | INTRAMUSCULAR | Status: AC
  Administered 2022-05-10: 80 mg via EPIDURAL

## 2022-05-10 NOTE — Discharge Instructions (Signed)

## 2022-05-17 ENCOUNTER — Other Ambulatory Visit (HOSPITAL_COMMUNITY): Payer: Self-pay | Admitting: Urology

## 2022-05-17 DIAGNOSIS — C61 Malignant neoplasm of prostate: Secondary | ICD-10-CM

## 2022-05-19 ENCOUNTER — Other Ambulatory Visit: Payer: Medicare Other

## 2022-05-19 VITALS — BP 106/70 | HR 62 | Resp 18

## 2022-05-19 DIAGNOSIS — Z515 Encounter for palliative care: Secondary | ICD-10-CM

## 2022-05-19 NOTE — Progress Notes (Signed)
1136 Palliative Encounter Note   PATIENT NAME: Oscar [name] DOB: 01/13/1933 MRN: [id number]  PRIMARY CARE PROVIDER: Deland Pretty, MD  RESPONSIBLE PARTY:  Acct ID - Guarantor Home Phone Work Phone Relationship Acct Type  [id number] ZIYAN, [phone]  Self P/F     [address] [place], [place], [place] [address]   RN/SW completed home visit. Son, grandson, daughters x 3 also present .   HISTORY OF PRESENT ILLNESS:  89 y.o. male with newly diagnosed Prostate Cancer. Also has a past medical history of hypertension, aortic insufficiency, chronic respiratory failure, history of esophageal cancer 2001 status post esophagectomy x 2- 2001 & 2015, small tongue residual, Barrett's esophagus, history of anastomotic stricturing, constipation, cholelithiasis, history of GERD, SBO secondary adhesions underwent laparoscopic lysis of adhesions 12/27/2019 and others listed below, returns to clinic today for evaluation of colonoscopy.    Socially: lives in 1  story house with son and grandson. Lived in house since 1969. Wife passed this past March. Had been married >60 years. Pt is veteran, served in [employer]. 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    Cognitive: Alert and oriented to person, place, and time. Appropriate in conversation. Tearful when talking about wife.   Appetite: c/o nausea after meal. Lost around 80 pounds in last year. Has dysphagia. Adds protein powder to juice to help with calories. Reports eating two meals per day.   Mobility: uses cane to walk. No falls. Still drives.   Sleeping Pattern: naps a lot during the day. Up and down at night.  Pain: c/o back pain mostly  Palliative Care/ Hospice: RN/SW explained role and purpose of palliative care to pt and family. Also discussed benefits of hospice care as well as the differences between the two with patient. Pt underwent hospice eval 05/05/22, he was appropriate for hospice  but declined services. Discussed concerns pt has regarding hospice care.  Goals of Care: Stay at home as long as possible.    CODE STATUS: Full Code  MOST Form and DNR forms and info given to pt and family.  ADVANCED DIRECTIVES: N MOST FORM:  PPS:    PHYSICAL EXAM:   VITALS: Today's Vitals   05/19/22 1136  BP: 106/70  Pulse: 62  Resp: 18  SpO2: 95%    LUNGS: clear bilaterally CARDIAC: Regular, no JVD EXTREMITIES: MAE x 4, steady gait SKIN: No issues     Summary of visit: RN/SW- M. Lonon arrived for initial home visit. Grandson met [place] at door and led [place] into living room. Daughters and son asked that we talked with them before seeing pt. They were very confused about pt's status. Daughter states, " We really don't know what is going on. He sees all these doctors and no one seems to know everything from the other doctors." Daughters reviewed pts history with team and also voiced that he had a new diagnosis of prostate cancer with no recommendations to treat.   Pt sitting in recliner upstairs in a spare room. Team sat with pt for a bit and talked about his past professions as well as his medical history. Pt voiced that he does have prostate cancer but will not be wanting chemo or radiation. When asked about hospice care, he was very open to that. He said, " I think I would like to have hospice. I think I have at least a good year to go tho, or at least I hope so." Family  very supportive to hospice coming back to care for pt. RN will reach out for hospice referral.    Jacqulyn Cane, RN

## 2022-05-19 NOTE — Progress Notes (Signed)
COMMUNITY PALLIATIVE CARE SW NOTE  PATIENT NAME: Oscar [name] DOB: 1932-09-11 MRN: [id number]  PRIMARY CARE PROVIDER: Deland Pretty, MD  RESPONSIBLE PARTY:  Acct ID - Guarantor Home Phone Work Phone Relationship Acct Type  [id number] MASIN, [phone]  Self P/F     [address] [place], [place], [place] [address]                         Palliative Care SOCIAL WORK Initial Visit  SW and RN-Oscar [name] completed an initial palliative care visit with patient at his home. Patient, his daughter-Oscar [name], niece-Oscar [name], son-Oscar [name], nephew-Oscar [name] was present for the visit. Patient had a hospice consult on 05/05/22. The team provided education regarding palliative care services, role in care, visit frequency and the difference between hospice and palliative.  The team talked with patient in his room as he was having stomach discomfort. The MOST form and DNR was presented and education was provided. Patient has a new diagnosis of prostate cancer along with previous diagnosed liver mass and does not desire treatment at this time. He has had an 80 lb weight loss in the last year. He reports a decreased appetite due to nausea, and difficulty swallowing. He is eating 25-50% of meals when he can. He was having pain to his right  great toe. He also has chronic back pain that extends to his hips and buttocks. Her is receiving injections for pain. He ambulates independently and is able to perform personal care and some household tasks. Patient is alert and oriented x3 with some confusion and forgetfulness. Patient did remember that he was evaluated by hospice. Patient also looked to his daughter or grandson to help him when he was unable to answer a question or provide information. His daughter stated that she has noted increased confusion in patient. Patient's wife died in 2021-08-17. Patient was very tearful when he talked about her and the support that his family has shown him. The team clarified  and discussed his goals, which he desires to be cared for in his home for the duration of his life. He was open to hospice services as he thought he was already signed up with hospice. He agreed to another hospice consult for services as this was endorsed by his children and extended family members present.   Social HX: Patient was born and raised in [place], [place]. He was married to his wife for 64 years until she expired in August 17, 2021. He has four children and several close extended family members that are supportive in his care. 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 Patient does not have any advance directives. He was provided education on code status (DNR) and MOST form. The forms were left with him to review. Patient is Pres[employer] by faith.   Plan: Hospice consult order will be requested.   Social History   Tobacco Use   Smoking status: Some Days    Types: Cigars, Pipe   Smokeless tobacco: Never   Tobacco comments:    2-3x/daily, sometimes not at all , tobacco info given 08/01/13  Substance Use Topics   Alcohol use: Yes    Alcohol/week: 7.0 - 14.0 standard drinks of alcohol    Types: 7 - 14 Standard drinks or equivalent per week    CODE STATUS: Full Code ADVANCED DIRECTIVES: Education provided MOST FORM COMPLETE:  Education provided [employer] EDUCATION PROVIDED: Consult requested.   Duration of  visit and documentation: 60 minutes.  [address] [place], [place]

## 2022-05-23 ENCOUNTER — Emergency Department (HOSPITAL_COMMUNITY): Payer: Medicare Other

## 2022-05-23 ENCOUNTER — Inpatient Hospital Stay (HOSPITAL_COMMUNITY)
Admission: EM | Admit: 2022-05-23 | Discharge: 2022-05-26 | DRG: 640 | Disposition: A | Discharge status: Hospice | Payer: Medicare Other | Attending: Internal Medicine | Admitting: Internal Medicine

## 2022-05-23 DIAGNOSIS — E861 Hypovolemia: Secondary | ICD-10-CM | POA: Diagnosis present

## 2022-05-23 DIAGNOSIS — K2270 Barrett's esophagus without dysplasia: Secondary | ICD-10-CM | POA: Diagnosis present

## 2022-05-23 DIAGNOSIS — Z8546 Personal history of malignant neoplasm of prostate: Secondary | ICD-10-CM

## 2022-05-23 DIAGNOSIS — F17290 Nicotine dependence, other tobacco product, uncomplicated: Secondary | ICD-10-CM | POA: Diagnosis present

## 2022-05-23 DIAGNOSIS — N400 Enlarged prostate without lower urinary tract symptoms: Secondary | ICD-10-CM | POA: Diagnosis present

## 2022-05-23 DIAGNOSIS — M1990 Unspecified osteoarthritis, unspecified site: Secondary | ICD-10-CM | POA: Diagnosis present

## 2022-05-23 DIAGNOSIS — E871 Hypo-osmolality and hyponatremia: Secondary | ICD-10-CM | POA: Diagnosis not present

## 2022-05-23 DIAGNOSIS — I351 Nonrheumatic aortic (valve) insufficiency: Secondary | ICD-10-CM | POA: Diagnosis present

## 2022-05-23 DIAGNOSIS — K5909 Other constipation: Secondary | ICD-10-CM | POA: Diagnosis present

## 2022-05-23 DIAGNOSIS — N529 Male erectile dysfunction, unspecified: Secondary | ICD-10-CM | POA: Diagnosis present

## 2022-05-23 DIAGNOSIS — G8929 Other chronic pain: Secondary | ICD-10-CM | POA: Diagnosis present

## 2022-05-23 DIAGNOSIS — Z66 Do not resuscitate: Secondary | ICD-10-CM | POA: Diagnosis present

## 2022-05-23 DIAGNOSIS — R5381 Other malaise: Secondary | ICD-10-CM | POA: Diagnosis present

## 2022-05-23 DIAGNOSIS — E119 Type 2 diabetes mellitus without complications: Secondary | ICD-10-CM | POA: Diagnosis present

## 2022-05-23 DIAGNOSIS — Z833 Family history of diabetes mellitus: Secondary | ICD-10-CM

## 2022-05-23 DIAGNOSIS — Z9049 Acquired absence of other specified parts of digestive tract: Secondary | ICD-10-CM

## 2022-05-23 DIAGNOSIS — I10 Essential (primary) hypertension: Secondary | ICD-10-CM | POA: Diagnosis present

## 2022-05-23 DIAGNOSIS — Z79899 Other long term (current) drug therapy: Secondary | ICD-10-CM

## 2022-05-23 DIAGNOSIS — Z8501 Personal history of malignant neoplasm of esophagus: Secondary | ICD-10-CM

## 2022-05-23 DIAGNOSIS — Z91030 Bee allergy status: Secondary | ICD-10-CM

## 2022-05-23 DIAGNOSIS — K219 Gastro-esophageal reflux disease without esophagitis: Secondary | ICD-10-CM | POA: Diagnosis present

## 2022-05-23 DIAGNOSIS — M549 Dorsalgia, unspecified: Secondary | ICD-10-CM | POA: Diagnosis present

## 2022-05-23 DIAGNOSIS — E43 Unspecified severe protein-calorie malnutrition: Secondary | ICD-10-CM | POA: Diagnosis present

## 2022-05-23 DIAGNOSIS — Z888 Allergy status to other drugs, medicaments and biological substances status: Secondary | ICD-10-CM

## 2022-05-23 DIAGNOSIS — Z681 Body mass index (BMI) 19 or less, adult: Secondary | ICD-10-CM

## 2022-05-23 DIAGNOSIS — M109 Gout, unspecified: Secondary | ICD-10-CM | POA: Diagnosis present

## 2022-05-23 DIAGNOSIS — R600 Localized edema: Secondary | ICD-10-CM | POA: Diagnosis present

## 2022-05-23 DIAGNOSIS — E860 Dehydration: Secondary | ICD-10-CM | POA: Diagnosis present

## 2022-05-23 LAB — COMPREHENSIVE METABOLIC PANEL
ALT: 14 U/L (ref 0–44)
AST: 27 U/L (ref 15–41)
Albumin: 4.1 g/dL (ref 3.5–5.0)
Alkaline Phosphatase: 106 U/L (ref 38–126)
Anion gap: 8 (ref 5–15)
BUN: 14 mg/dL (ref 8–23)
CO2: 26 mmol/L (ref 22–32)
Calcium: 9 mg/dL (ref 8.9–10.3)
Chloride: 83 mmol/L — ABNORMAL LOW (ref 98–111)
Creatinine, Ser: 1.3 mg/dL — ABNORMAL HIGH (ref 0.61–1.24)
GFR, Estimated: 53 mL/min — ABNORMAL LOW (ref >60)
Glucose, Bld: 118 mg/dL — ABNORMAL HIGH (ref 70–99)
Potassium: 4.1 mmol/L (ref 3.5–5.1)
Sodium: 117 mmol/L — CL (ref 135–145)
Total Bilirubin: 0.9 mg/dL (ref 0.3–1.2)
Total Protein: 7.3 g/dL (ref 6.5–8.1)

## 2022-05-23 LAB — URINALYSIS, ROUTINE W REFLEX MICROSCOPIC
Bacteria, UA: NONE SEEN
Bilirubin Urine: NEGATIVE
Glucose, UA: NEGATIVE mg/dL
Ketones, ur: NEGATIVE mg/dL
Leukocytes,Ua: NEGATIVE
Nitrite: NEGATIVE
Protein, ur: NEGATIVE mg/dL
Specific Gravity, Urine: 1.008 (ref 1.005–1.030)
pH: 7 (ref 5.0–8.0)

## 2022-05-23 LAB — CBC
HCT: 33.8 % — ABNORMAL LOW (ref 39.0–52.0)
Hemoglobin: 11.5 g/dL — ABNORMAL LOW (ref 13.0–17.0)
MCH: 32.8 pg (ref 26.0–34.0)
MCHC: 34 g/dL (ref 30.0–36.0)
MCV: 96.3 fL (ref 80.0–100.0)
Platelets: 298 10*3/uL (ref 150–400)
RBC: 3.51 MIL/uL — ABNORMAL LOW (ref 4.22–5.81)
RDW: 11.3 % — ABNORMAL LOW (ref 11.5–15.5)
WBC: 8.4 10*3/uL (ref 4.0–10.5)
nRBC: 0 % (ref 0.0–0.2)

## 2022-05-23 NOTE — ED Triage Notes (Signed)
Patient states he was sent to ED from PCP for evaluation of "critically low sodium". Patient complains of lower back pain and generalized weakness, patient unsure of sodium lab value.

## 2022-05-23 NOTE — ED Provider Triage Note (Signed)
Emergency Medicine Provider Triage Evaluation Note  Oscar [name] , a 89 y.o. male  was evaluated in triage.  Pt complains of hyponatremia on lab values drawn at outside facility, reports weakness, lower back pain, shortness of breath.  Hx of prostate cancer, malnutrition, chronic hyponatremia. Reportedly sodium <110 but cannot verify.  Review of Systems  Positive: Shob, generalized weakness Negative: Chest pain  Physical Exam  BP (!) 153/70   Pulse (!) 59   Temp (!) 97.5 F (36.4 C) (Oral)   Resp 18   SpO2 100%  Gen:   Awake, no distress   Resp:  Normal effort  MSK:   Moves extremities without difficulty  Other:  Moves all limbs spontaneously  Medical Decision Making  Medically screening exam initiated at 4:43 PM.  Appropriate orders placed.  HARLO FABELA was informed that the remainder of the evaluation will be completed by another provider, this initial triage assessment does not replace that evaluation, and the importance of remaining in the ED until their evaluation is complete.  Workup initiated in triage    Anselmo Pickler, [place] 05/23/22 1643

## 2022-05-24 ENCOUNTER — Inpatient Hospital Stay (HOSPITAL_COMMUNITY): Payer: Medicare Other

## 2022-05-24 ENCOUNTER — Other Ambulatory Visit: Payer: Self-pay

## 2022-05-24 DIAGNOSIS — K219 Gastro-esophageal reflux disease without esophagitis: Secondary | ICD-10-CM | POA: Diagnosis present

## 2022-05-24 DIAGNOSIS — M549 Dorsalgia, unspecified: Secondary | ICD-10-CM | POA: Diagnosis present

## 2022-05-24 DIAGNOSIS — I351 Nonrheumatic aortic (valve) insufficiency: Secondary | ICD-10-CM | POA: Diagnosis present

## 2022-05-24 DIAGNOSIS — M1990 Unspecified osteoarthritis, unspecified site: Secondary | ICD-10-CM | POA: Diagnosis present

## 2022-05-24 DIAGNOSIS — Z66 Do not resuscitate: Secondary | ICD-10-CM | POA: Diagnosis present

## 2022-05-24 DIAGNOSIS — R5381 Other malaise: Secondary | ICD-10-CM | POA: Diagnosis present

## 2022-05-24 DIAGNOSIS — F17290 Nicotine dependence, other tobacco product, uncomplicated: Secondary | ICD-10-CM | POA: Diagnosis present

## 2022-05-24 DIAGNOSIS — E119 Type 2 diabetes mellitus without complications: Secondary | ICD-10-CM | POA: Diagnosis present

## 2022-05-24 DIAGNOSIS — E43 Unspecified severe protein-calorie malnutrition: Secondary | ICD-10-CM | POA: Diagnosis present

## 2022-05-24 DIAGNOSIS — Z681 Body mass index (BMI) 19 or less, adult: Secondary | ICD-10-CM | POA: Diagnosis not present

## 2022-05-24 DIAGNOSIS — N529 Male erectile dysfunction, unspecified: Secondary | ICD-10-CM | POA: Diagnosis present

## 2022-05-24 DIAGNOSIS — E861 Hypovolemia: Secondary | ICD-10-CM | POA: Diagnosis present

## 2022-05-24 DIAGNOSIS — E860 Dehydration: Secondary | ICD-10-CM | POA: Diagnosis present

## 2022-05-24 DIAGNOSIS — K5909 Other constipation: Secondary | ICD-10-CM | POA: Diagnosis present

## 2022-05-24 DIAGNOSIS — R600 Localized edema: Secondary | ICD-10-CM | POA: Diagnosis present

## 2022-05-24 DIAGNOSIS — I10 Essential (primary) hypertension: Secondary | ICD-10-CM | POA: Diagnosis present

## 2022-05-24 DIAGNOSIS — N400 Enlarged prostate without lower urinary tract symptoms: Secondary | ICD-10-CM | POA: Diagnosis present

## 2022-05-24 DIAGNOSIS — E871 Hypo-osmolality and hyponatremia: Secondary | ICD-10-CM

## 2022-05-24 DIAGNOSIS — G8929 Other chronic pain: Secondary | ICD-10-CM | POA: Diagnosis present

## 2022-05-24 DIAGNOSIS — Z8546 Personal history of malignant neoplasm of prostate: Secondary | ICD-10-CM | POA: Diagnosis not present

## 2022-05-24 DIAGNOSIS — R609 Edema, unspecified: Secondary | ICD-10-CM | POA: Diagnosis not present

## 2022-05-24 DIAGNOSIS — Z79899 Other long term (current) drug therapy: Secondary | ICD-10-CM | POA: Diagnosis not present

## 2022-05-24 DIAGNOSIS — K2270 Barrett's esophagus without dysplasia: Secondary | ICD-10-CM | POA: Diagnosis present

## 2022-05-24 DIAGNOSIS — Z888 Allergy status to other drugs, medicaments and biological substances status: Secondary | ICD-10-CM | POA: Diagnosis not present

## 2022-05-24 DIAGNOSIS — M109 Gout, unspecified: Secondary | ICD-10-CM | POA: Diagnosis present

## 2022-05-24 LAB — BASIC METABOLIC PANEL
Anion gap: 10 (ref 5–15)
Anion gap: 7 (ref 5–15)
Anion gap: 7 (ref 5–15)
Anion gap: 10 (ref 5–15)
BUN: 13 mg/dL (ref 8–23)
BUN: 13 mg/dL (ref 8–23)
BUN: 12 mg/dL (ref 8–23)
BUN: 12 mg/dL (ref 8–23)
CO2: 25 mmol/L (ref 22–32)
CO2: 26 mmol/L (ref 22–32)
CO2: 26 mmol/L (ref 22–32)
CO2: 23 mmol/L (ref 22–32)
Calcium: 9 mg/dL (ref 8.9–10.3)
Calcium: 8.5 mg/dL — ABNORMAL LOW (ref 8.9–10.3)
Calcium: 8.2 mg/dL — ABNORMAL LOW (ref 8.9–10.3)
Calcium: 8.5 mg/dL — ABNORMAL LOW (ref 8.9–10.3)
Chloride: 82 mmol/L — ABNORMAL LOW (ref 98–111)
Chloride: 85 mmol/L — ABNORMAL LOW (ref 98–111)
Chloride: 88 mmol/L — ABNORMAL LOW (ref 98–111)
Chloride: 84 mmol/L — ABNORMAL LOW (ref 98–111)
Creatinine, Ser: 1.23 mg/dL (ref 0.61–1.24)
Creatinine, Ser: 1.15 mg/dL (ref 0.61–1.24)
Creatinine, Ser: 1.08 mg/dL (ref 0.61–1.24)
Creatinine, Ser: 1.18 mg/dL (ref 0.61–1.24)
GFR, Estimated: 57 mL/min — ABNORMAL LOW (ref >60)
GFR, Estimated: >60 mL/min (ref >60)
GFR, Estimated: >60 mL/min (ref >60)
GFR, Estimated: 60 mL/min — ABNORMAL LOW (ref >60)
Glucose, Bld: 117 mg/dL — ABNORMAL HIGH (ref 70–99)
Glucose, Bld: 99 mg/dL (ref 70–99)
Glucose, Bld: 84 mg/dL (ref 70–99)
Glucose, Bld: 127 mg/dL — ABNORMAL HIGH (ref 70–99)
Potassium: 4.2 mmol/L (ref 3.5–5.1)
Potassium: 4.1 mmol/L (ref 3.5–5.1)
Potassium: 3.9 mmol/L (ref 3.5–5.1)
Potassium: 4.1 mmol/L (ref 3.5–5.1)
Sodium: 117 mmol/L — CL (ref 135–145)
Sodium: 118 mmol/L — CL (ref 135–145)
Sodium: 121 mmol/L — ABNORMAL LOW (ref 135–145)
Sodium: 117 mmol/L — CL (ref 135–145)

## 2022-05-24 LAB — CBC
HCT: 33.4 % — ABNORMAL LOW (ref 39.0–52.0)
Hemoglobin: 11.8 g/dL — ABNORMAL LOW (ref 13.0–17.0)
MCH: 33 pg (ref 26.0–34.0)
MCHC: 35.3 g/dL (ref 30.0–36.0)
MCV: 93.3 fL (ref 80.0–100.0)
Platelets: 278 10*3/uL (ref 150–400)
RBC: 3.58 MIL/uL — ABNORMAL LOW (ref 4.22–5.81)
RDW: 10.9 % — ABNORMAL LOW (ref 11.5–15.5)
WBC: 7.7 10*3/uL (ref 4.0–10.5)
nRBC: 0 % (ref 0.0–0.2)

## 2022-05-24 LAB — CORTISOL: Cortisol, Plasma: 5.9 ug/dL

## 2022-05-24 LAB — PHOSPHORUS: Phosphorus: 3.6 mg/dL (ref 2.5–4.6)

## 2022-05-24 LAB — TROPONIN I (HIGH SENSITIVITY)
Troponin I (High Sensitivity): 5 ng/L (ref <18)
Troponin I (High Sensitivity): 6 ng/L (ref <18)

## 2022-05-24 LAB — MAGNESIUM: Magnesium: 1.5 mg/dL — ABNORMAL LOW (ref 1.7–2.4)

## 2022-05-24 LAB — TSH: TSH: 1.052 u[IU]/mL (ref 0.350–4.500)

## 2022-05-24 LAB — SODIUM, URINE, RANDOM: Sodium, Ur: 47 mmol/L

## 2022-05-24 MED ORDER — CYCLOBENZAPRINE HCL 5 MG PO TABS
5.0000 mg | ORAL_TABLET | Freq: Every day | ORAL | Status: DC | PRN
  Administered 2022-05-25: 5 mg via ORAL
  Filled 2022-05-24: qty 1

## 2022-05-24 MED ORDER — SODIUM CHLORIDE 0.9 % IV BOLUS
250.0000 mL | Freq: Once | INTRAVENOUS | Status: AC
  Administered 2022-05-24: 250 mL via INTRAVENOUS

## 2022-05-24 MED ORDER — ATENOLOL 50 MG PO TABS
25.0000 mg | ORAL_TABLET | Freq: Every day | ORAL | Status: DC
  Administered 2022-05-24 – 2022-05-25 (×2): 25 mg via ORAL
  Filled 2022-05-24 (×3): qty 1

## 2022-05-24 MED ORDER — MELATONIN 3 MG PO TABS: 3.0000 mg | ORAL_TABLET | Freq: Every evening | ORAL | Status: DC | PRN

## 2022-05-24 MED ORDER — AMLODIPINE BESYLATE 5 MG PO TABS
5.0000 mg | ORAL_TABLET | Freq: Every day | ORAL | Status: DC
  Administered 2022-05-24 – 2022-05-26 (×3): 5 mg via ORAL
  Filled 2022-05-24 (×3): qty 1

## 2022-05-24 MED ORDER — IRBESARTAN 300 MG PO TABS
300.0000 mg | ORAL_TABLET | Freq: Every day | ORAL | Status: DC
  Administered 2022-05-25 – 2022-05-26 (×2): 300 mg via ORAL
  Filled 2022-05-24 (×2): qty 1

## 2022-05-24 MED ORDER — VITAMIN B-12 1000 MCG PO TABS
1000.0000 ug | ORAL_TABLET | Freq: Every day | ORAL | Status: DC
  Administered 2022-05-24 – 2022-05-26 (×3): 1000 ug via ORAL
  Filled 2022-05-24 (×3): qty 1

## 2022-05-24 MED ORDER — SENNOSIDES-DOCUSATE SODIUM 8.6-50 MG PO TABS
1.0000 | ORAL_TABLET | Freq: Every day | ORAL | Status: DC
  Administered 2022-05-24: 1 via ORAL
  Filled 2022-05-24 (×2): qty 1

## 2022-05-24 MED ORDER — ACETAMINOPHEN 325 MG PO TABS
650.0000 mg | ORAL_TABLET | Freq: Four times a day (QID) | ORAL | Status: DC | PRN
  Administered 2022-05-24 – 2022-05-25 (×3): 650 mg via ORAL
  Filled 2022-05-24 (×3): qty 2

## 2022-05-24 MED ORDER — ENOXAPARIN SODIUM 40 MG/0.4ML IJ SOSY
40.0000 mg | PREFILLED_SYRINGE | Freq: Every day | INTRAMUSCULAR | Status: DC
  Administered 2022-05-24 – 2022-05-25 (×2): 40 mg via SUBCUTANEOUS
  Filled 2022-05-24 (×3): qty 0.4

## 2022-05-24 MED ORDER — PANTOPRAZOLE SODIUM 40 MG PO TBEC
40.0000 mg | DELAYED_RELEASE_TABLET | Freq: Every day | ORAL | Status: DC
  Administered 2022-05-24 – 2022-05-26 (×3): 40 mg via ORAL
  Filled 2022-05-24 (×3): qty 1

## 2022-05-24 MED ORDER — SODIUM CHLORIDE 0.9 % IV SOLN: INTRAVENOUS | Status: DC

## 2022-05-24 MED ORDER — POLYETHYLENE GLYCOL 3350 17 G PO PACK: 17.0000 g | PACK | Freq: Every day | ORAL | Status: DC | PRN

## 2022-05-24 MED ORDER — PROCHLORPERAZINE EDISYLATE 10 MG/2ML IJ SOLN
5.0000 mg | Freq: Four times a day (QID) | INTRAMUSCULAR | Status: DC | PRN
  Administered 2022-05-24: 5 mg via INTRAVENOUS
  Filled 2022-05-24: qty 2

## 2022-05-24 NOTE — Progress Notes (Addendum)
Patient admitted after midnight, see H&P for details. -main issue is hyponatremia  -baseline 124-130 (most recently in October 124)  -IVF  -poor PO intake since he does not wear dentures- will change to soft diet- await osmo  -seen by palliative care at home and plan was for hospice-- will add TOC to see if they can help facilitate  -downgrade bed from progressive as he is stable  -home in 24-48 hours-- daughter update on phone  -would d/c HCTZ   Eulogio Bear DO

## 2022-05-24 NOTE — ED Notes (Signed)
ED TO INPATIENT HANDOFF REPORT  ED Nurse Name and Phone #: Kelby Aline RN [phone]   S Name/Age/Gender Oscar [name] 89 y.o. male Room/Bed: 002C/002C  Code Status   Code Status: Full Code  Home/SNF/Other Home Patient oriented to: self, place, time, and situation Is this baseline? Yes   Triage Complete: Triage complete  Chief Complaint Hyponatremia [E87.1]  Triage Note Patient states he was sent to ED from PCP for evaluation of "critically low sodium". Patient complains of lower back pain and generalized weakness, patient unsure of sodium lab value.   Allergies Allergies  Allergen Reactions   Bee Venom Anaphylaxis   Flomax [Tamsulosin] Other (See Comments)    "Makes me feel weird"   Tadalafil     Other reaction(s): Nasal congestion    Level of Care/Admitting Diagnosis ED Disposition     ED Disposition  Admit   Condition  --   [place]: [place] [[id number]]  Level of Care: Telemetry Medical [[id number]]  May admit patient to [facility] or [facility] if equivalent level of care is available:: Yes  Covid Evaluation: Asymptomatic - no recent exposure (last 10 days) testing not required  Diagnosis: Hyponatremia [973532]  Admitting Physician: Geradine Girt [place]  Attending Physician: Geradine Girt [[id number]]  Certification:: I certify this patient will need inpatient services for at least 2 midnights          B Medical/Surgery History Past Medical History:  Diagnosis Date   Acid reflux    Barrett's esophagus    BPH (benign prostatic hyperplasia)    Colon polyps    DDD (degenerative disc disease)    Diabetes mellitus    controlled with diet and exercise   ED (erectile dysfunction)    Esophageal cancer ([place]) 2001   tx'd surgery - abdominal & right posterior chest approach (2001) - [place], [place]   First degree AV block    Gout    Hemorrhoids    History of palpitations    Hyperlipidemia    Hypertension    Lipoma    Obesity     Osteoarthritis    Small bowel obstruction ([place])    Tinea versicolor    Tinnitus    Past Surgical History:  Procedure Laterality Date   bilateral olecranon bursal excisions     ESOPHAGECTOMY  2001   [place] [facility]/Abd approach.  High Grade Dysplasia w esophageal CA   HEMORRHOID SURGERY     LAPAROSCOPY N/A 12/27/2019   Procedure: LAPAROSCOPY DIAGNOSTI; LYSIS OF ADHESIONS;  Surgeon: Michael Boston, MD;  Location: [facility];  Service: General;  Laterality: N/A;   NM MYOCAR [place]   dipyridamole myoview - stress images show medium in size, moderate in intensity perfusion defect in basal inferior & mid inferior walls with mild defect reversibility at rest, EF 64%   TRANSTHORACIC ECHOCARDIOGRAM  2011   EF=>55%, mild mitral annular calcif, mild calcif of MV apparatus, mild TR, normal RSVP, mild AV regurg, aortic root sclerosis/calcifiication   VASECTOMY       A IV Location/Drains/Wounds Patient Lines/Drains/Airways Status     Active Line/Drains/Airways     Name Placement date Placement time Site Days   Peripheral IV 05/24/22 20 G Anterior;Left;Upper Arm 05/24/22  1734  Arm  less than 1   External Urinary Catheter 05/24/22  1819  --  less than 1            Intake/Output Last 24 hours  Intake/Output Summary (Last 24 hours) at 05/24/2022 1948 Last data filed at 05/24/2022 0408 Gross per 24 hour  Intake 250 ml  Output --  Net 250 ml    Labs/Imaging Results for orders placed or performed during the hospital encounter of 05/23/22 (from the past 48 hour(s))  CBC     Status: Abnormal   Collection Time: 05/23/22  4:40 PM  Result Value Ref Range   WBC 8.4 4.0 - 10.5 K/uL   RBC 3.51 (L) 4.22 - 5.81 MIL/uL   Hemoglobin 11.5 (L) 13.0 - 17.0 g/dL   HCT 33.8 (L) 39.0 - 52.0 %   MCV 96.3 80.0 - 100.0 fL   MCH 32.8 26.0 - 34.0 pg   MCHC 34.0 30.0 - 36.0 g/dL   RDW 11.3 (L) 11.5 - 15.5 %   Platelets 298 150 - 400 K/uL   nRBC 0.0 0.0 - 0.2 %    Comment:  Performed at [facility], [place] [address] [place], [place] [address]  Comprehensive metabolic panel     Status: Abnormal   Collection Time: 05/23/22  4:40 PM  Result Value Ref Range   Sodium 117 (LL) 135 - 145 mmol/L    Comment: CRITICAL RESULT CALLED TO, READ BACK BY AND VERIFIED WITH C. LOWE RN 05/23/22 '@1844'$  BY J. WHITE   Potassium 4.1 3.5 - 5.1 mmol/L   Chloride 83 (L) 98 - 111 mmol/L   CO2 26 22 - 32 mmol/L   Glucose, Bld 118 (H) 70 - 99 mg/dL    Comment: Glucose reference range applies only to samples taken after fasting for at least 8 hours.   BUN 14 8 - 23 mg/dL   Creatinine, Ser 1.30 (H) 0.61 - 1.24 mg/dL   Calcium 9.0 8.9 - 10.3 mg/dL   Total Protein 7.3 6.5 - 8.1 g/dL   Albumin 4.1 3.5 - 5.0 g/dL   AST 27 15 - 41 U/L   ALT 14 0 - 44 U/L   Alkaline Phosphatase 106 38 - 126 U/L   Total Bilirubin 0.9 0.3 - 1.2 mg/dL   GFR, Estimated 53 (L) >60 mL/min    Comment: (NOTE) Calculated using the CKD-EPI Creatinine Equation (2021)    Anion gap 8 5 - 15    Comment: Performed at [facility] [address], [place], [place] [address]  Urinalysis, Routine w reflex microscopic Urine, Clean Catch     Status: Abnormal   Collection Time: 05/23/22  4:40 PM  Result Value Ref Range   Color, Urine YELLOW YELLOW   APPearance HAZY (A) CLEAR   Specific Gravity, Urine 1.008 1.005 - 1.030   pH 7.0 5.0 - 8.0   Glucose, UA NEGATIVE NEGATIVE mg/dL   Hgb urine dipstick SMALL (A) NEGATIVE   Bilirubin Urine NEGATIVE NEGATIVE   Ketones, ur NEGATIVE NEGATIVE mg/dL   Protein, ur NEGATIVE NEGATIVE mg/dL   Nitrite NEGATIVE NEGATIVE   Leukocytes,Ua NEGATIVE NEGATIVE   RBC / HPF 11-20 0 - 5 RBC/hpf   WBC, UA 0-5 0 - 5 WBC/hpf   Bacteria, UA NONE SEEN NONE SEEN   Squamous Epithelial / HPF 0-5 0 - 5 /HPF    Comment: Performed at [facility], [place] [address], [place], [place] [address]  Sodium, urine, random     Status: None   Collection Time: 05/24/22  3:04 AM  Result Value Ref  Range   Sodium, Ur 47 mmol/L    Comment: Performed at [facility],  [address] [place], [place] [address]  Basic metabolic panel     Status: Abnormal   Collection Time: 05/24/22  3:04 AM  Result Value Ref Range   Sodium 117 (LL) 135 - 145 mmol/L    Comment: CRITICAL RESULT CALLED TO, READ BACK BY AND VERIFIED WITH E.PEASLEY,RN. 3016 05/24/22.LPAIT   Potassium 4.2 3.5 - 5.1 mmol/L   Chloride 82 (L) 98 - 111 mmol/L   CO2 25 22 - 32 mmol/L   Glucose, Bld 117 (H) 70 - 99 mg/dL    Comment: Glucose reference range applies only to samples taken after fasting for at least 8 hours.   BUN 13 8 - 23 mg/dL   Creatinine, Ser 1.23 0.61 - 1.24 mg/dL   Calcium 9.0 8.9 - 10.3 mg/dL   GFR, Estimated 57 (L) >60 mL/min    Comment: (NOTE) Calculated using the CKD-EPI Creatinine Equation (2021)    Anion gap 10 5 - 15    Comment: Performed at [facility] [address], [place], [place] [address]  Troponin I (High Sensitivity)     Status: None   Collection Time: 05/24/22  3:04 AM  Result Value Ref Range   Troponin I (High Sensitivity) 5 <18 ng/L    Comment: (NOTE) Elevated high sensitivity troponin I (hsTnI) values and significant  changes across serial measurements may suggest ACS but many other  chronic and acute conditions are known to elevate hsTnI results.  Refer to the "Links" section for chest pain algorithms and additional  guidance. Performed at [facility], [place] [address], [place], [place] [address]   Troponin I (High Sensitivity)     Status: None   Collection Time: 05/24/22  4:13 AM  Result Value Ref Range   Troponin I (High Sensitivity) 6 <18 ng/L    Comment: (NOTE) Elevated high sensitivity troponin I (hsTnI) values and significant  changes across serial measurements may suggest ACS but many other  chronic and acute conditions are known to elevate hsTnI results.  Refer to the "Links" section for chest pain algorithms and additional  guidance. Performed at  [facility], [place] [address], [place], [place] [address]   Basic metabolic panel     Status: Abnormal   Collection Time: 05/24/22  4:13 AM  Result Value Ref Range   Sodium 118 (LL) 135 - 145 mmol/L    Comment: CRITICAL RESULT CALLED TO, READ BACK BY AND VERIFIED WITH E.PEASLEY,RN. 0510 05/24/22. LPAIT   Potassium 4.1 3.5 - 5.1 mmol/L   Chloride 85 (L) 98 - 111 mmol/L   CO2 26 22 - 32 mmol/L   Glucose, Bld 99 70 - 99 mg/dL    Comment: Glucose reference range applies only to samples taken after fasting for at least 8 hours.   BUN 13 8 - 23 mg/dL   Creatinine, Ser 1.15 0.61 - 1.24 mg/dL   Calcium 8.5 (L) 8.9 - 10.3 mg/dL   GFR, Estimated >60 >60 mL/min    Comment: (NOTE) Calculated using the CKD-EPI Creatinine Equation (2021)    Anion gap 7 5 - 15    Comment: Performed at [facility] [address], [place], [place] [address]  TSH     Status: None   Collection Time: 05/24/22  4:13 AM  Result Value Ref Range   TSH 1.052 0.350 - 4.500 uIU/mL    Comment: Performed by a 3rd Generation assay with a functional sensitivity of <=0.01 uIU/mL. Performed at [facility], [place] [address], [place], [place] [address]   CBC     Status: Abnormal   Collection Time: 05/24/22  4:13 AM  Result Value Ref Range   WBC 7.7 4.0 - 10.5 K/uL   RBC 3.58 (L) 4.22 - 5.81 MIL/uL   Hemoglobin 11.8 (L) 13.0 - 17.0 g/dL   HCT 33.4 (L) 39.0 - 52.0 %   MCV 93.3 80.0 - 100.0 fL   MCH 33.0 26.0 - 34.0 pg   MCHC 35.3 30.0 - 36.0 g/dL   RDW 10.9 (L) 11.5 - 15.5 %   Platelets 278 150 - 400 K/uL   nRBC 0.0 0.0 - 0.2 %    Comment: Performed at [facility], [place] [address] [place], [place] [address]  Magnesium     Status: Abnormal   Collection Time: 05/24/22  4:13 AM  Result Value Ref Range   Magnesium 1.5 (L) 1.7 - 2.4 mg/dL    Comment: Performed at [facility] [address] [place], [place] [address]  Phosphorus     Status: None   Collection Time: 05/24/22  4:13 AM  Result  Value Ref Range   Phosphorus 3.6 2.5 - 4.6 mg/dL    Comment: Performed at [facility] [address], [place], [place] [address]  Cortisol     Status: None   Collection Time: 05/24/22  4:13 AM  Result Value Ref Range   Cortisol, Plasma 5.9 ug/dL    Comment: (NOTE) AM    6.7 - 22.6 ug/dL PM   <10.0       ug/dL Performed at [facility] [address], [place], [place] [address]   Basic metabolic panel     Status: Abnormal   Collection Time: 05/24/22  6:47 AM  Result Value Ref Range   Sodium 121 (L) 135 - 145 mmol/L   Potassium 3.9 3.5 - 5.1 mmol/L   Chloride 88 (L) 98 - 111 mmol/L   CO2 26 22 - 32 mmol/L   Glucose, Bld 84 70 - 99 mg/dL    Comment: Glucose reference range applies only to samples taken after fasting for at least 8 hours.   BUN 12 8 - 23 mg/dL   Creatinine, Ser 1.08 0.61 - 1.24 mg/dL   Calcium 8.2 (L) 8.9 - 10.3 mg/dL   GFR, Estimated >60 >60 mL/min    Comment: (NOTE) Calculated using the CKD-EPI Creatinine Equation (2021)    Anion gap 7 5 - 15    Comment: Performed at [facility] [address] [place], [place] [address]  Basic metabolic panel     Status: Abnormal   Collection Time: 05/24/22  3:40 PM  Result Value Ref Range   Sodium 117 (LL) 135 - 145 mmol/L    Comment: CRITICAL RESULT CALLED TO, READ BACK BY AND VERIFIED WITH E ANELLO,RN 1757 05/24/2022 WBOND   Potassium 4.1 3.5 - 5.1 mmol/L   Chloride 84 (L) 98 - 111 mmol/L   CO2 23 22 - 32 mmol/L   Glucose, Bld 127 (H) 70 - 99 mg/dL    Comment: Glucose reference range applies only to samples taken after fasting for at least 8 hours.   BUN 12 8 - 23 mg/dL   Creatinine, Ser 1.18 0.61 - 1.24 mg/dL   Calcium 8.5 (L) 8.9 - 10.3 mg/dL   GFR, Estimated 60 (L) >60 mL/min    Comment: (NOTE) Calculated using the CKD-EPI Creatinine Equation (2021)  Anion gap 10 5 - 15    Comment: Performed at [facility] [address] [place], [place] [address]   VAS [place] LOWER EXTREMITY  VENOUS (DVT)  Result Date: 05/24/2022  Lower Venous DVT Study Patient Name:  DEMETRES PROCHNOW [facility]  Date of Exam:   05/24/2022 Medical Rec #: [id number]        Accession #:    [id number] Date of Birth: [birth year]        Patient Gender: M Patient Age:   59 years Exam Location:  [facility] Procedure:      VAS [place] LOWER EXTREMITY VENOUS (DVT) Referring Phys: Archie Patten HALL --------------------------------------------------------------------------------  Indications: Edema LT>RT.  Comparison Study: 12-10-2021 Prior bilateral lower extremity venous study was                   negative for DVT. Performing Technologist: Darlin Coco RDMS, RVT  Examination Guidelines: A complete evaluation includes B-mode imaging, spectral Doppler, color Doppler, and power Doppler as needed of all accessible portions of each vessel. Bilateral testing is considered an integral part of a complete examination. Limited examinations for reoccurring indications may be performed as noted. The reflux portion of the exam is performed with the patient in reverse Trendelenburg.  +-----+---------------+---------+-----------+----------+--------------+ RIGHTCompressibilityPhasicitySpontaneityPropertiesThrombus Aging +-----+---------------+---------+-----------+----------+--------------+ CFV  Full           Yes      Yes                                 +-----+---------------+---------+-----------+----------+--------------+   +---------+---------------+---------+-----------+----------+--------------+ LEFT     CompressibilityPhasicitySpontaneityPropertiesThrombus Aging +---------+---------------+---------+-----------+----------+--------------+ CFV      Full           Yes      Yes                                 +---------+---------------+---------+-----------+----------+--------------+ SFJ      Full                                                         +---------+---------------+---------+-----------+----------+--------------+ FV Prox  Full                                                        +---------+---------------+---------+-----------+----------+--------------+ FV Mid   Full                                                        +---------+---------------+---------+-----------+----------+--------------+ FV DistalFull                                                        +---------+---------------+---------+-----------+----------+--------------+ PFV      Full                                                        +---------+---------------+---------+-----------+----------+--------------+  POP      Full           Yes      Yes                                 +---------+---------------+---------+-----------+----------+--------------+ PTV      Full                                                        +---------+---------------+---------+-----------+----------+--------------+ PERO     Full                                                        +---------+---------------+---------+-----------+----------+--------------+    Summary: RIGHT: - No evidence of common femoral vein obstruction.  LEFT: - There is no evidence of deep vein thrombosis in the lower extremity.  - No cystic structure found in the popliteal fossa.  *See table(s) above for measurements and observations. Electronically signed by Deitra Mayo MD on 05/24/2022 at 10:15:46 AM.    Final    DG Chest 2 View  Result Date: 05/23/2022 CLINICAL DATA:  Shortness of breath EXAM: CHEST - 2 VIEW COMPARISON:  02/16/2022, CT 03/18/2022 FINDINGS: Emphysema. Scarring or atelectasis medial left base. No acute airspace disease, pleural effusion or pneumothorax. Postsurgical changes of the mediastinum. Chronic right seventh rib resection. IMPRESSION: No active cardiopulmonary disease. Emphysema with scarring or atelectasis at the left base. Electronically  Signed   By: Donavan Foil M.D.   On: 05/23/2022 17:22    Pending Labs Unresulted Labs (From admission, onward)     Start     Ordered   05/31/22 0500  Creatinine, serum  (enoxaparin (LOVENOX)    CrCl >/= 30 ml/min)  Weekly,   R     Comments: while on enoxaparin therapy    05/24/22 0438   05/24/22 5809  Basic metabolic panel  Once,   STAT        05/24/22 1415   05/23/22 2213  Osmolality  Once,   URGENT        05/23/22 2212            Vitals/Pain Today's Vitals   05/24/22 1615 05/24/22 1700 05/24/22 1730 05/24/22 1830  BP:  139/61 (!) 126/96 131/70  Pulse:  67 79 (!) 59  Resp: '14 16 18 18  '$ Temp:    98.2 F (36.8 C)  TempSrc:      SpO2:  99% 100% 100%  Weight:      Height:      PainSc:    0-No pain    Isolation Precautions No active isolations  Medications Medications  0.9 %  sodium chloride infusion ( Intravenous New Bag/Given 05/24/22 0408)  pantoprazole (PROTONIX) EC tablet 40 mg (40 mg Oral Given 05/24/22 0814)  cyanocobalamin (VITAMIN B12) tablet 1,000 mcg (1,000 mcg Oral Given 05/24/22 0815)  enoxaparin (LOVENOX) injection 40 mg (40 mg Subcutaneous Given 05/24/22 0814)  acetaminophen (TYLENOL) tablet 650 mg (650 mg Oral Given 05/24/22 1700)  senna-docusate (Senokot-S) tablet 1 tablet (has no administration in time range)  polyethylene glycol (MIRALAX / GLYCOLAX) packet  17 g (has no administration in time range)  prochlorperazine (COMPAZINE) injection 5 mg (5 mg Intravenous Given 05/24/22 0814)  melatonin tablet 3 mg (has no administration in time range)  atenolol (TENORMIN) tablet 25 mg (25 mg Oral Given 05/24/22 1351)  amLODipine (NORVASC) tablet 5 mg (5 mg Oral Given 05/24/22 1351)  cyclobenzaprine (FLEXERIL) tablet 5 mg (has no administration in time range)  irbesartan (AVAPRO) tablet 300 mg (0 mg Oral Hold 05/24/22 1221)  sodium chloride 0.9 % bolus 250 mL (0 mLs Intravenous Stopped 05/24/22 0408)    Mobility walks     Focused  Assessments     R Recommendations: See Admitting Provider Note  Report given to:   Additional Notes: Likes to get out of bed a lot, IV pulled out for the second time due to him walking around room, will attempt to place another one before him coming up

## 2022-05-24 NOTE — H&P (Addendum)
History and Physical  Oscar [name] QIO:[id number] DOB: 20-Apr-1933 DOA: 05/23/2022  Referring physician: Dr. Wyvonnia Dusky, [place]. PCP: Deland Pretty, MD  Outpatient Specialists: Palliative care medicine, orthopedic surgery, GI, Patient coming from: Home through his PCPs office  Chief Complaint: Abnormal labs  HPI: Oscar [name] is a 89 y.o. male with medical history significant for prostate cancer, chronic hyponatremia with baseline sodium of 124, hypertension, aortic insufficiency, history of esophageal cancer in 2001 status post esophagectomy x 2 in 2001 in 2015, small tongue residual, Barrett's esophagus, history of anastomotic stricturing, cholelithiasis, history of GERD, SBO secondary to adhesions, chronic constipation, who presented to [facility] ED, sent by his PCP due to abnormal routine labs.  Noted to have a serum sodium of 117.  Sent to the ED for further evaluation and management of his acute on chronic hyponatremia.  In the ED, hypovolemic on exam.  Notable asymmetrical left lower extremity edema.  Vascular ultrasound ordered to rule out DVT.  He received a dose of NS to 50 cc x 1 and was started on maintenance IV fluid NS at 75 cc/h x 2 days.  Home HCTZ held.  Urine lites, TSH, and cortisol level are pending.  ED Course: Tmax 97.8.  BP 180/78, pulse 59, respiratory rate 15, O2 saturation 100% on 2 L.  Lab studies remarkable for serum sodium 117, repeat 117.  Creatinine 1.30 from baseline creatinine 0.9. Hemoglobin 11.5.  WBC 8.4, platelet count 298.  Review of Systems: Review of systems as noted in the HPI. All other systems reviewed and are negative.   Past Medical History:  Diagnosis Date   Acid reflux    Barrett's esophagus    BPH (benign prostatic hyperplasia)    Colon polyps    DDD (degenerative disc disease)    Diabetes mellitus    controlled with diet and exercise   ED (erectile dysfunction)    Esophageal cancer ([place]) 2001   tx'd surgery - abdominal & right posterior chest  approach (2001) - [place], [place]   First degree AV block    Gout    Hemorrhoids    History of palpitations    Hyperlipidemia    Hypertension    Lipoma    Obesity    Osteoarthritis    Small bowel obstruction (HCC)    Tinea versicolor    Tinnitus    Past Surgical History:  Procedure Laterality Date   bilateral olecranon bursal excisions     ESOPHAGECTOMY  2001   Lolo Thoraicic/Abd approach.  High Grade Dysplasia w esophageal CA   HEMORRHOID SURGERY     LAPAROSCOPY N/A 12/27/2019   Procedure: LAPAROSCOPY DIAGNOSTI; LYSIS OF ADHESIONS;  Surgeon: Michael Boston, MD;  Location: [facility] ORS;  Service: General;  Laterality: N/A;   NM MYOCAR PERF WALL MOTION  2011   dipyridamole myoview - stress images show medium in size, moderate in intensity perfusion defect in basal inferior & mid inferior walls with mild defect reversibility at rest, EF 64%   TRANSTHORACIC ECHOCARDIOGRAM  2011   EF=>55%, mild mitral annular calcif, mild calcif of MV apparatus, mild TR, normal RSVP, mild AV regurg, aortic root sclerosis/calcifiication   VASECTOMY      Social History:  reports that he has been smoking cigars and pipe. He has never used smokeless tobacco. He reports current alcohol use of about 7.0 - 14.0 standard drinks of alcohol per week. He reports that he does not use drugs.   Allergies  Allergen Reactions  Bee Venom Anaphylaxis   Flomax [Tamsulosin] Other (See Comments)    "Makes me feel weird"   Tadalafil     Other reaction(s): Nasal congestion    Family History  Problem Relation Age of Onset   Diabetes Mother    CAD Mother    Stroke Sister    Colon cancer Neg Hx    Esophageal cancer Neg Hx    Rectal cancer Neg Hx    Stomach cancer Neg Hx       Prior to Admission medications   Medication Sig Start Date End Date Taking? Authorizing Provider  atenolol (TENORMIN) 25 MG tablet Take 25 mg by mouth daily. 03/23/19   [provider]  losartan-hydrochlorothiazide  (HYZAAR) 100-12.5 MG tablet Take 1 tablet by mouth daily. 12/07/20   [provider]  Misc Natural Products ([place]) CAPS Take 1 capsule by mouth daily. Saw Palmetto    [provider]  omeprazole (PRILOSEC) 20 MG capsule Take 40 mg by mouth daily.    [provider]  polyethylene glycol powder (GLYCOLAX/MIRALAX) 17 GM/SCOOP powder Take 17 g by mouth daily.    [provider]  vitamin B-12 (CYANOCOBALAMIN) 1000 MCG tablet Take 1,000 mcg by mouth daily. 07/14/15   [provider]    Physical Exam: BP (!) 180/78   Pulse (!) 59   Temp 97.6 F (36.4 C) (Oral)   Resp 15   SpO2 100%   General: 89 y.o. year-old male Frail-appearing in no acute distress.  Alert and oriented x3. Cardiovascular: Regular rate and rhythm with no rubs or gallops.  No thyromegaly or JVD noted.  Asymmetric Left lower extremity pitting edema, noted. Respiratory: Clear to auscultation with no wheezes or rales. Good inspiratory effort. Abdomen: Soft nontender nondistended with normal bowel sounds x4 quadrants. Muskuloskeletal: No cyanosis, clubbing or edema noted bilaterally Neuro: CN II-XII intact, strength, sensation, reflexes Skin: No ulcerative lesions noted or rashes Psychiatry: Judgement and insight appear normal. Mood is appropriate for condition and setting          Labs on Admission:  Basic Metabolic Panel: Recent Labs  Lab 05/23/22 1640  NA 117*  K 4.1  CL 83*  CO2 26  GLUCOSE 118*  BUN 14  CREATININE 1.30*  CALCIUM 9.0   Liver Function Tests: Recent Labs  Lab 05/23/22 1640  AST 27  ALT 14  ALKPHOS 106  BILITOT 0.9  PROT 7.3  ALBUMIN 4.1   No results for input(s): "LIPASE", "AMYLASE" in the last 168 hours. No results for input(s): "AMMONIA" in the last 168 hours. CBC: Recent Labs  Lab 05/23/22 1640  WBC 8.4  HGB 11.5*  HCT 33.8*  MCV 96.3  PLT 298   Cardiac Enzymes: No results for input(s): "CKTOTAL", "CKMB", "CKMBINDEX",  "TROPONINI" in the last 168 hours.  BNP (last 3 results) No results for input(s): "BNP" in the last 8760 hours.  ProBNP (last 3 results) No results for input(s): "PROBNP" in the last 8760 hours.  CBG: No results for input(s): "GLUCAP" in the last 168 hours.  Radiological Exams on Admission: DG Chest 2 View  Result Date: 05/23/2022 CLINICAL DATA:  Shortness of breath EXAM: CHEST - 2 VIEW COMPARISON:  02/16/2022, CT 03/18/2022 FINDINGS: Emphysema. Scarring or atelectasis medial left base. No acute airspace disease, pleural effusion or pneumothorax. Postsurgical changes of the mediastinum. Chronic right seventh rib resection. IMPRESSION: No active cardiopulmonary disease. Emphysema with scarring or atelectasis at the left base. Electronically Signed   By: Maudie Mercury  Francoise Ceo M.D.   On: 05/23/2022 17:22    EKG: I independently viewed the EKG done and my findings are as followed: Sinus rhythm rate of 59.  Nonspecific ST-T changes.  QTc 403.  Assessment/Plan Present on Admission:  Hyponatremia  Principal Problem:   Hyponatremia  Hypovolemic hyponatremia, acute on chronic Presented with serum sodium 117, repeat 117 NS at 75 cc/h x 2 days BMP every 4 hours Follow TSH, cortisol, urine lites. Hold off home HCTZ.  Unilateral left lower extremity edema, POA Rule out DVT Follow-up vascular Doppler ultrasound of left lower extremity.  History of prostate cancer In remission.  Hypertension Hold off home HCTZ and losartan due to hyponatremia. Monitor vital signs.  Chronic constipation Bowel regimen  GERD Resume home Prilosec  Physical debility Ambulates with a cane at baseline PT OT assessment Fall precautions  Severe protein calorie malnutrition Albumin 4.1 Severe muscle mass loss Liberalize diet and encourage oral intake.  Chronic constipation Bowel regimen added    Critical care time: 35 minutes.    DVT prophylaxis: Subcu Lovenox daily  Code Status: Full code,  stated by the patient himself.  Family Communication: None at bedside.  Disposition Plan: Admitted to progressive care unit  Consults called: Cardiology consulted by the EDP.  Admission status: Inpatient status.   Status is: Inpatient The patient requires at least 2 midnights for further evaluation and treatment of present condition.   Kayleen Memos MD [facility] Pager [phone]  If 7PM-7AM, please contact night-coverage [email or web address] Password [facility]  05/24/2022, 3:37 AM

## 2022-05-24 NOTE — ED Provider Notes (Signed)
[facility] EMERGENCY DEPARTMENT Provider Note   CSN: [id number] Arrival date & time: 05/23/22  1547     History  Chief Complaint  Patient presents with   Abnormal Lab    Oscar [name] is a 89 y.o. male.  Patient sent from PCPs office with hyponatremia of unknown value.  He believes it was less than 110.  He reports he was having routine blood work for his prostate cancer I was told to come to the ED for low sodium.  He is not receiving any prostate cancer treatment currently.  He denies any difficulty breathing, chest pain, abdominal pain.  He has some dry heaving several days ago but no vomiting.  Denies any chest pain or shortness of breath.  Denies any dizziness or lightheadedness.  Has some chronic back pain which is unchanged.  No pain with urination or blood in the urine.  Appears he has a history of hyponatremia but never this low.  The history is provided by the patient.  Abnormal Lab      Home Medications Prior to Admission medications   Medication Sig Start Date End Date Taking? Authorizing Provider  atenolol (TENORMIN) 25 MG tablet Take 25 mg by mouth daily. 03/23/19   [provider]  losartan-hydrochlorothiazide (HYZAAR) 100-12.5 MG tablet Take 1 tablet by mouth daily. 12/07/20   [provider]  Misc Natural Products ([place]) CAPS Take 1 capsule by mouth daily. Saw Palmetto    [provider]  omeprazole (PRILOSEC) 20 MG capsule Take 40 mg by mouth daily.    [provider]  polyethylene glycol powder (GLYCOLAX/MIRALAX) 17 GM/SCOOP powder Take 17 g by mouth daily.    [provider]  vitamin B-12 (CYANOCOBALAMIN) 1000 MCG tablet Take 1,000 mcg by mouth daily. 07/14/15   [provider]      Allergies    Bee venom, Flomax [tamsulosin], and Tadalafil    Review of Systems   Review of Systems  Constitutional:  Negative for activity change, appetite change and fever.  HENT:  Negative for  congestion and rhinorrhea.   Respiratory:  Negative for cough, chest tightness and shortness of breath.   Cardiovascular:  Negative for chest pain.  Gastrointestinal:  Positive for nausea. Negative for abdominal pain and vomiting.  Genitourinary:  Negative for dysuria and hematuria.  Musculoskeletal:  Negative for arthralgias and myalgias.  Skin:  Negative for rash.   all other systems are negative except as noted in the HPI and PMH.    Physical Exam Updated Vital Signs BP (!) 180/76   Pulse 74   Temp 97.6 F (36.4 C) (Oral)   Resp 16   SpO2 100%  Physical Exam Vitals and nursing note reviewed.  Constitutional:      General: He is not in acute distress.    Appearance: He is well-developed.  HENT:     Head: Normocephalic and atraumatic.     Mouth/Throat:     Pharynx: No oropharyngeal exudate.  Eyes:     Conjunctiva/sclera: Conjunctivae normal.     Pupils: Pupils are equal, round, and reactive to light.  Neck:     Comments: No meningismus. Cardiovascular:     Rate and Rhythm: Normal rate and regular rhythm.     Heart sounds: Normal heart sounds. No murmur heard. Pulmonary:     Effort: Pulmonary effort is normal. No respiratory distress.     Breath sounds: Normal breath sounds.  Abdominal:     Palpations: Abdomen is  soft.     Tenderness: There is no abdominal tenderness. There is no guarding or rebound.  Musculoskeletal:        General: No tenderness. Normal range of motion.     Cervical back: Normal range of motion and neck supple.  Skin:    General: Skin is warm.  Neurological:     Mental Status: He is alert and oriented to person, place, and time.     Cranial Nerves: No cranial nerve deficit.     Motor: No abnormal muscle tone.     Coordination: Coordination normal.     Comments: No ataxia on finger to nose bilaterally. No pronator drift. 5/5 strength throughout. CN 2-12 intact.Equal grip strength. Sensation intact.   Psychiatric:        Behavior: Behavior normal.      ED Results / Procedures / Treatments   Labs (all labs ordered are listed, but only abnormal results are displayed) Labs Reviewed  CBC - Abnormal; Notable for the following components:      Result Value   RBC 3.51 (*)    Hemoglobin 11.5 (*)    HCT 33.8 (*)    RDW 11.3 (*)    All other components within normal limits  COMPREHENSIVE METABOLIC PANEL - Abnormal; Notable for the following components:   Sodium 117 (*)    Chloride 83 (*)    Glucose, Bld 118 (*)    Creatinine, Ser 1.30 (*)    GFR, Estimated 53 (*)    All other components within normal limits  URINALYSIS, ROUTINE W REFLEX MICROSCOPIC - Abnormal; Notable for the following components:   APPearance HAZY (*)    Hgb urine dipstick SMALL (*)    All other components within normal limits  BASIC METABOLIC PANEL - Abnormal; Notable for the following components:   Sodium 117 (*)    Chloride 82 (*)    Glucose, Bld 117 (*)    GFR, Estimated 57 (*)    All other components within normal limits  SODIUM, URINE, RANDOM  OSMOLALITY  BASIC METABOLIC PANEL  BASIC METABOLIC PANEL  BASIC METABOLIC PANEL  BASIC METABOLIC PANEL  TSH  CBC  MAGNESIUM  PHOSPHORUS  CORTISOL  TROPONIN I (HIGH SENSITIVITY)  TROPONIN I (HIGH SENSITIVITY)    EKG EKG Interpretation  Date/Time:  Monday May 23 2022 16:34:39 EST Ventricular Rate:  59 PR Interval:  238 QRS Duration: 102 QT Interval:  408 QTC Calculation: 403 R Axis:   46 Text Interpretation: Sinus bradycardia with 1st degree A-V block Septal infarct , age undetermined Abnormal ECG When compared with ECG of 10-Apr-2021 23:41, PREVIOUS ECG IS PRESENT No significant change was found Confirmed by Ezequiel Essex [phone]) on 05/24/2022 1:48:06 AM  Radiology DG Chest 2 View  Result Date: 05/23/2022 CLINICAL DATA:  Shortness of breath EXAM: CHEST - 2 VIEW COMPARISON:  02/16/2022, CT 03/18/2022 FINDINGS: Emphysema. Scarring or atelectasis medial left base. No acute airspace disease,  pleural effusion or pneumothorax. Postsurgical changes of the mediastinum. Chronic right seventh rib resection. IMPRESSION: No active cardiopulmonary disease. Emphysema with scarring or atelectasis at the left base. Electronically Signed   By: Donavan Foil M.D.   On: 05/23/2022 17:22    Procedures Procedures    Medications Ordered in ED Medications  sodium chloride 0.9 % bolus 250 mL (has no administration in time range)    ED Course/ Medical Decision Making/ A&P  Medical Decision Making Amount and/or Complexity of Data Reviewed Labs: ordered. Decision-making details documented in ED Course. Radiology: ordered and independent interpretation performed. Decision-making details documented in ED Course. ECG/medicine tests: ordered and independent interpretation performed. Decision-making details documented in ED Course.  Risk Decision regarding hospitalization.   Sent from PCP with hyponatremia.  Vitals are stable, no distress, does not appear to have any mental status changes.  Neurological exam is nonfocal  Hyponatremia of 117, was 124 in October.  Patient does take thiazide diuretic.  Will correct slowly with normal saline.  Does not appear to be mental status changes. Avoid over correction.  Plan to admit for hyponatremia discussed with Dr. Nevada Crane.        Final Clinical Impression(s) / ED Diagnoses Final diagnoses:  None    Rx / DC Orders ED Discharge Orders     None         Kadir Azucena, Annie Main, MD 05/24/22 [phone]

## 2022-05-24 NOTE — ED Notes (Signed)
Pt ambulating with patient in hallway. NAD.

## 2022-05-24 NOTE — Evaluation (Signed)
Physical Therapy Evaluation Patient Details Name: Oscar [name] MRN: [id number] DOB: [birth year] Today's Date: 05/24/2022  History of Present Illness  Oscar [name] is a 89 y.o. male who presented to [facility] ED, sent by his PCP due to hyponatermia, Na at 117. PMH: prostate cancer, chronic hyponatremia with baseline sodium of 124, HTN, aortic insufficiency, history of esophageal cancer in 2001 status post esophagectomy x 2 in 2001 in 2015, small tongue residual, Barrett's esophagus, h/o anastomotic stricturing, cholelithiasis,h/o GERD, SBO secondary to adhesions, chronic constipation  Clinical Impression  Pt admitted with above. Pt admittedly reports "I don't have good balance." Pt reports using cane at baseline and golfing 2x/month. Pt reports having a hard time eating as well. Per chart pt with a palliative consult at home on 05/19/2022 recommending transitioning care to hospice. Pt reports having 24/7 support. Pt to benefit from use of RW and 24/7 assist/supervision due to increased falls risk. Recommending HHPT however aware pt may be transitioning to hospice.        Recommendations for follow up therapy are one component of a multi-disciplinary discharge planning process, led by the attending physician.  Recommendations may be updated based on patient status, additional functional criteria and insurance authorization.  Follow Up Recommendations Home health PT (if patient and family desire, however aware palliative is involved with the recommendation of hospice)      Assistance Recommended at Discharge Frequent or constant Supervision/Assistance  Patient can return home with the following  A little help with walking and/or transfers;A little help with bathing/dressing/bathroom;Direct supervision/assist for medications management;Assist for transportation;Help with stairs or ramp for entrance    Equipment Recommendations None recommended by PT (has RW)  Recommendations for Other Services        Functional Status Assessment Patient has had a recent decline in their functional status and demonstrates the ability to make significant improvements in function in a reasonable and predictable amount of time.     Precautions / Restrictions Precautions Precautions: Fall Precaution Comments: hyponatremia Restrictions Weight Bearing Restrictions: No      Mobility  Bed Mobility Overal bed mobility: Needs Assistance Bed Mobility: Supine to Sit     Supine to sit: HOB elevated, Supervision     General bed mobility comments: increased time, pt able to bring self to EOB of gurney in ED    Transfers Overall transfer level: Needs assistance Equipment used: Rolling walker (2 wheels) Transfers: Sit to/from Stand Sit to Stand: Min assist           General transfer comment: minA to power up and steady during transition of hands from gurney to RW    Ambulation/Gait Ambulation/Gait assistance: Min assist, Mod assist Gait Distance (Feet): 100 Feet Assistive device: Rolling walker (2 wheels) Gait Pattern/deviations: Step-through pattern, Decreased stride length, Trunk flexed (R lateral bias) Gait velocity: dec Gait velocity interpretation: <1.31 ft/sec, indicative of household ambulator   General Gait Details: pt initially unsteady with inability to keep RW down on the ground with R lateral bias with what appears to be ataxic gait pattern however with increased time pt improved to more midline in RW and ability to have reciprocal, fluid gait pattern  Stairs            Wheelchair Mobility    Modified Rankin (Stroke Patients Only)       Balance Overall balance assessment: Needs assistance Sitting-balance support: Feet supported, No upper extremity supported Sitting balance-Leahy Scale: Fair   Postural control:  (trunk flexed) Standing balance support:  Bilateral upper extremity supported, During functional activity, Reliant on assistive device for balance Standing  balance-Leahy Scale: Poor Standing balance comment: reliant on bilat UE support                             Pertinent Vitals/Pain Pain Assessment Pain Assessment: No/denies pain    Home Living Family/patient expects to be discharged to:: Private residence Living Arrangements: Children (son and grandson) Available Help at Discharge: Family;Available 24 hours/day Type of Home: House Home Access: Level entry     Alternate Level Stairs-Number of Steps: 7 (split level) Home Layout: Multi-level Home Equipment: Cane - single point;Rolling Walker (2 wheels);Rollator (4 wheels)      Prior Function Prior Level of Function : Independent/Modified Independent             Mobility Comments: drives, play golf 2x/month, uses [employer] ADLs Comments: doesn't cook well, reports "my son will have to help now"     Hand Dominance   Dominant Hand: Right    Extremity/Trunk Assessment   Upper Extremity Assessment Upper Extremity Assessment: Generalized weakness    Lower Extremity Assessment Lower Extremity Assessment: Generalized weakness    Cervical / Trunk Assessment Cervical / Trunk Assessment: Kyphotic  Communication   Communication: Expressive difficulties (muffled speech/dysarthria)  Cognition Arousal/Alertness: Awake/alert Behavior During Therapy: WFL for tasks assessed/performed Overall Cognitive Status: Within Functional Limits for tasks assessed                                 General Comments: aware of balance deficist        General Comments General comments (skin integrity, edema, etc.): L LE edema, VSS, hyponatremia    Exercises     Assessment/Plan    PT Assessment Patient needs continued PT services  PT Problem List Decreased strength;Decreased activity tolerance;Decreased balance;Decreased mobility;Decreased coordination;Decreased knowledge of use of DME;Decreased safety awareness       PT Treatment Interventions DME instruction;Gait  training;Stair training;Functional mobility training;Therapeutic activities;Therapeutic exercise;Balance training;Neuromuscular re-education    PT Goals (Current goals can be found in the Care Plan section)  Acute Rehab PT Goals Patient Stated Goal: home PT Goal Formulation: With patient Time For Goal Achievement: 06/07/22 Potential to Achieve Goals: Fair    Frequency Min 3X/week     Co-evaluation               AM-PAC PT "6 Clicks" Mobility  Outcome Measure Help needed turning from your back to your side while in a flat bed without using bedrails?: A Little Help needed moving from lying on your back to sitting on the side of a flat bed without using bedrails?: A Little Help needed moving to and from a bed to a chair (including a wheelchair)?: A Little Help needed standing up from a chair using your arms (e.g., wheelchair or bedside chair)?: A Little Help needed to walk in hospital room?: A Lot Help needed climbing 3-5 steps with a railing? : A Lot 6 Click Score: 16    End of Session Equipment Utilized During Treatment: Gait belt Activity Tolerance: Patient tolerated treatment well Patient left: in bed;with call bell/phone within reach;with nursing/sitter in room (with [place] tech to perform DVT doppler in bilat LEs) Nurse Communication: Mobility status PT Visit Diagnosis: Unsteadiness on feet (R26.81);Muscle weakness (generalized) (M62.81);Difficulty in walking, not elsewhere classified (R26.2)    Time: 9326-7124 PT Time Calculation (min) (  ACUTE ONLY): 34 min   Charges:   PT Evaluation $PT Eval Moderate Complexity: 1 Mod PT Treatments $Gait Training: 8-22 mins        Kittie Plater, PT, DPT Acute Rehabilitation Services Secure chat preferred Office #: [phone]   Berline Lopes 05/24/2022, 11:52 AM

## 2022-05-24 NOTE — ED Notes (Signed)
Date and time results received: 05/24/22 0352 (use smartphrase ".now" to insert current time)  Test: sodium Critical Value: 117  Name of Provider Notified: Vevelyn Francois, MD

## 2022-05-25 ENCOUNTER — Encounter (HOSPITAL_COMMUNITY): Payer: Self-pay | Admitting: Internal Medicine

## 2022-05-25 DIAGNOSIS — E871 Hypo-osmolality and hyponatremia: Secondary | ICD-10-CM | POA: Diagnosis not present

## 2022-05-25 LAB — SODIUM
Sodium: 120 mmol/L — ABNORMAL LOW (ref 135–145)
Sodium: 137 mmol/L (ref 135–145)

## 2022-05-25 LAB — OSMOLALITY: Osmolality: 251 mOsm/kg — ABNORMAL LOW (ref 275–295)

## 2022-05-25 MED ORDER — HYDRALAZINE HCL 25 MG PO TABS
25.0000 mg | ORAL_TABLET | Freq: Three times a day (TID) | ORAL | Status: DC | PRN
  Administered 2022-05-25: 25 mg via ORAL
  Filled 2022-05-25: qty 1

## 2022-05-25 MED ORDER — SODIUM CHLORIDE 1 G PO TABS
1.0000 g | ORAL_TABLET | Freq: Three times a day (TID) | ORAL | Status: DC
  Administered 2022-05-25 (×3): 1 g via ORAL
  Filled 2022-05-25 (×3): qty 1

## 2022-05-25 MED ORDER — ENSURE ENLIVE PO LIQD
237.0000 mL | Freq: Two times a day (BID) | ORAL | Status: DC
  Administered 2022-05-25: 237 mL via ORAL

## 2022-05-25 NOTE — Evaluation (Signed)
Occupational Therapy Evaluation Patient Details Name: Oscar [name] MRN: [id number] DOB: [birth year] Today's Date: 05/25/2022   History of Present Illness Oscar [name] is a 89 y.o. male who presented to [facility] ED, sent by his PCP due to hyponatermia, Na at 117. PMH: prostate cancer, chronic hyponatremia with baseline sodium of 124, HTN, aortic insufficiency, history of esophageal cancer in 2001 status post esophagectomy x 2 in 2001 in 2015, small tongue residual, Barrett's esophagus, h/o anastomotic stricturing, cholelithiasis,h/o GERD, SBO secondary to adhesions, chronic constipation   Clinical Impression   PTA, pt lived with son and grandson and was independent in ADL and IADL. Upon eval, pt performing LB ADL with up to min A and UB ADL with set-up. Pt required min cues for safety and decision making during ADL this session. Pt presents with decreased strength, balance, and activity tolerance. Recommending discharge home with HHOT to optimize safety and independence in ADL and IADL.      Recommendations for follow up therapy are one component of a multi-disciplinary discharge planning process, led by the attending physician.  Recommendations may be updated based on patient status, additional functional criteria and insurance authorization.   Follow Up Recommendations  Home health OT     Assistance Recommended at Discharge Intermittent Supervision/Assistance  Patient can return home with the following A little help with walking and/or transfers;A little help with bathing/dressing/bathroom;Assistance with cooking/housework;Assist for transportation;Help with stairs or ramp for entrance    Functional Status Assessment  Patient has had a recent decline in their functional status and demonstrates the ability to make significant improvements in function in a reasonable and predictable amount of time.  Equipment Recommendations  None recommended by OT    Recommendations for Other Services        Precautions / Restrictions Precautions Precautions: Fall Precaution Comments: hyponatremia Restrictions Weight Bearing Restrictions: No      Mobility Bed Mobility               General bed mobility comments: In recliner on arrival and departure    Transfers Overall transfer level: Needs assistance Equipment used: Rolling walker (2 wheels) Transfers: Sit to/from Stand Sit to Stand: Min guard           General transfer comment: Min guard A for safety. min cues initially for hand placement      Balance Overall balance assessment: Needs assistance Sitting-balance support: Feet supported, No upper extremity supported Sitting balance-Leahy Scale: Fair     Standing balance support: Bilateral upper extremity supported, During functional activity, Reliant on assistive device for balance Standing balance-Leahy Scale: Poor Standing balance comment: can static stand min guard A without UE support                           ADL either performed or assessed with clinical judgement   ADL Overall ADL's : Needs assistance/impaired Eating/Feeding: Modified independent   Grooming: Min guard;Standing   Upper Body Bathing: Set up;Sitting   Lower Body Bathing: Min guard;Sit to/from stand   Upper Body Dressing : Set up;Sitting   Lower Body Dressing: Minimal assistance;Sit to/from stand Lower Body Dressing Details (indicate cue type and reason): no assist to don socks. Donning underpants in standing and min A for balance. Educated regarding performance while seated until need to pull up for safety and pt verbalizing understanding. Toilet Transfer: Min guard;Ambulation;Rolling walker (2 wheels) Toilet Transfer Details (indicate cue type and reason): min guard A for safety  Functional mobility during ADLs: Min guard;Rolling walker (2 wheels) General ADL Comments: min guard A for safety     Vision Ability to See in Adequate Light: 0  Adequate Patient Visual Report: No change from baseline Vision Assessment?: No apparent visual deficits     Perception     Praxis      Pertinent Vitals/Pain Pain Assessment Pain Assessment: No/denies pain     Hand Dominance Right   Extremity/Trunk Assessment Upper Extremity Assessment Upper Extremity Assessment: Generalized weakness   Lower Extremity Assessment Lower Extremity Assessment: Defer to PT evaluation   Cervical / Trunk Assessment Cervical / Trunk Assessment: Kyphotic   Communication Communication Communication: Expressive difficulties (muffled speech)   Cognition Arousal/Alertness: Awake/alert Behavior During Therapy: WFL for tasks assessed/performed Overall Cognitive Status: Within Functional Limits for tasks assessed                                 General Comments: aware of balance deficits as well as decr strength     General Comments  VSS    Exercises     Shoulder Instructions      Home Living Family/patient expects to be discharged to:: Private residence Living Arrangements: Children Available Help at Discharge: Family;Available 24 hours/day Type of Home: House Home Access: Level entry     Home Layout: Multi-level Alternate Level Stairs-Number of Steps: 7 (split level home) Alternate Level Stairs-Rails: Left Bathroom Shower/Tub: Tub/shower unit   Bathroom Toilet: Standard     Home Equipment: Cane - single Barista (2 wheels);Rollator (4 wheels)          Prior Functioning/Environment Prior Level of Function : Independent/Modified Independent             Mobility Comments: drives, play golf 2x/month, uses [employer] ADLs Comments: doesn't cook well, reports "my son will have to help now"        OT Problem List: Decreased strength;Decreased activity tolerance;Impaired balance (sitting and/or standing);Decreased knowledge of use of DME or AE      OT Treatment/Interventions: Self-care/ADL  training;Therapeutic exercise;DME and/or AE instruction;Therapeutic activities;Patient/family education;Balance training    OT Goals(Current goals can be found in the care plan section) Acute Rehab OT Goals Patient Stated Goal: get stronger OT Goal Formulation: With patient Time For Goal Achievement: 06/08/22 Potential to Achieve Goals: Good  OT Frequency: Min 2X/week    Co-evaluation              AM-PAC OT "6 Clicks" Daily Activity     Outcome Measure Help from another person eating meals?: None Help from another person taking care of personal grooming?: A Little Help from another person toileting, which includes using toliet, bedpan, or urinal?: A Little Help from another person bathing (including washing, rinsing, drying)?: A Little Help from another person to put on and taking off regular upper body clothing?: A Little Help from another person to put on and taking off regular lower body clothing?: A Little 6 Click Score: 19   End of Session Equipment Utilized During Treatment: Gait belt;Rolling walker (2 wheels) Nurse Communication: Mobility status  Activity Tolerance: Patient tolerated treatment well Patient left: in chair;with call bell/phone within reach;with chair alarm set  OT Visit Diagnosis: Unsteadiness on feet (R26.81);Muscle weakness (generalized) (M62.81);Other abnormalities of gait and mobility (R26.89)                Time: 1112-1140 OT Time Calculation (min): 28 min Charges:  OT  General Charges $OT Visit: 1 Visit OT Evaluation $OT Eval Low Complexity: 1 Low OT Treatments $Self Care/Home Management : 8-22 mins  Elder Cyphers, OTR/L [facility] Acute Rehabilitation Office: [phone]   Magnus Ivan 05/25/2022, 12:35 PM

## 2022-05-25 NOTE — TOC Initial Note (Signed)
Transition of Care [facility]) - Initial/Assessment Note    Patient Details  Name: Oscar [name] MRN: [id number] Date of Birth: 1933-01-01  [facility] CM/SW Contact:    Tom-Johnson, Renea Ee, RN Phone Number: 05/25/2022, 2:46 PM  Clinical Narrative:                  CM spoke with patient at bedside about needs for post hospital transition. Admitted for Hyponatremia. From home with son and grandson. Has three children. Active with Palliative services with [facility]. States he started transitioning to Hospice services prior to admission and requests to resume care with [facility] at discharge. CM consulted for home with hospice and referral called in to [place] with [facility].   Home health PT/OT recommended, referral not made as patient going home with Hospice.  Patient states he has all necessary DME's at home. States he has two private caregivers at home. Patient is enrolled with the [facility], CM called the 72hrs notification hotline- Notification#-[id number]. PCP is Deland Pretty, MD and [facility] [address]. Family will transport at discharge. CM will continue to follow as patient progresses towards discharge.      Expected Discharge Plan: Home w Hospice Care Barriers to Discharge: Continued Medical Work up   Patient Goals and CMS Choice Patient states their goals for this hospitalization and ongoing recovery are:: To return home with Hospice CMS Medicare.gov Compare Post Acute Care list provided to:: Patient Choice offered to / list presented to : Patient, Adult Children (Daughter, [name])      Expected Discharge Plan and Services In-house Referral: Hospice / Palliative Care Discharge Planning Services: CM Consult Post Acute Care Choice: Hospice Living arrangements for the past 2 months: Single Family Home                 DME Arranged: N/A DME Agency: NA       HH Arranged: NA (Recommended but patient going home  with Hospice.) [place] Agency: NA        Prior Living Arrangements/Services Living arrangements for the past 2 months: Single Family Home Lives with:: Adult Children Patient language and need for interpreter reviewed:: Yes Do you feel safe going back to the place where you live?: Yes      Need for Family Participation in Patient Care: Yes (Comment) Care giver support system in place?: Yes (comment) Current home services: DME, Hospice (All necessary DME's, care givers, Hospice/Palliative) Criminal Activity/Legal Involvement Pertinent to Current Situation/Hospitalization: No - Comment as needed  Activities of Daily Living Home Assistive Devices/Equipment: External Monitoring Devices ADL Screening (condition at time of admission) Patient's cognitive ability adequate to safely complete daily activities?: Yes Is the patient deaf or have difficulty hearing?: No Does the patient have difficulty seeing, even when wearing glasses/contacts?: No Does the patient have difficulty concentrating, remembering, or making decisions?: Yes Patient able to express need for assistance with ADLs?: Yes Does the patient have difficulty dressing or bathing?: Yes Independently performs ADLs?: Yes (appropriate for developmental age) Does the patient have difficulty walking or climbing stairs?: Yes Weakness of Legs: Right Weakness of Arms/Hands: Left  Permission Sought/Granted Permission sought to share information with : Case Manager, Family Supports, Customer service manager Permission granted to share information with : Yes, Verbal Permission Granted              Emotional Assessment Appearance:: Appears stated age Attitude/Demeanor/Rapport: Engaged, Gracious Affect (typically observed): Accepting, Appropriate, Hopeful, Pleasant Orientation: : Oriented to Self, Oriented  to Place, Oriented to  Time, Oriented to Situation Alcohol / Substance Use: Not Applicable Psych Involvement: No  (comment)  Admission diagnosis:  Hyponatremia [E87.1] Patient Active Problem List   Diagnosis Date Noted   Sepsis ([place]) 04/11/2021   Influenza A 04/11/2021   Hyponatremia 12/27/2019   Hyperkalemia 12/26/2019   ARF (acute renal failure) ([place]) 12/26/2019   History of esophagectomy 12/26/2019   Tobacco abuse 10/19/2018   BPH (benign prostatic hyperplasia) 10/19/2018   AKI (acute kidney injury) ([place])    Acute on chronic respiratory failure with hypoxia ([place]) 11/15/2017   Malnutrition of moderate degree 02/15/2017   Leukocytosis 02/12/2017   OSA (obstructive sleep apnea) 02/12/2017   Dehydration 02/12/2017   SBO (small bowel obstruction) - recurrent 10/31/2016   Aortic insufficiency 07/09/2013   HTN (hypertension) 07/09/2013   GERD 07/13/2009   DYSPHAGIA 07/13/2009   FLATULENCE-GAS-BLOATING 07/13/2009   History of esophageal cancer 2001 07/18/1999   PCP:  Deland Pretty, MD [id number] [address]Phone: [phone]: [phone] [place]Phone: [phone]: [phone]    Social Determinants of Health (SDOH) Social History: SDOH Screenings   Food Insecurity: No Food Insecurity (05/25/2022)  Housing: Low Risk  (05/25/2022)  Transportation Needs: No Transportation Needs (05/25/2022)  Utilities: Not At Risk (05/25/2022)  Tobacco Use: High Risk (05/25/2022)   SDOH Interventions: Transportation Interventions: Intervention Not Indicated, Inpatient TOC, Patient Resources (Friends/Family)   Readmission Risk Interventions     No data to display

## 2022-05-25 NOTE — Progress Notes (Signed)
PROGRESS NOTE    TU BAYLE  KNL:[id number] DOB: 03/13/1933 DOA: 05/23/2022 PCP: Deland Pretty, MD    Brief Narrative:   Oscar [name] is a 89 y.o. male with past medical history significant for prostate cancer, chronic hyponatremia (baseline Na 124), HTN, aortic insufficiency, history of esophageal cancer (2001) s/p esophagectomy times 06/1999 and 2015, small tongue residual, Barrett's esophagus, history of anaplastic stricturing, cholelithiasis, GERD, history of SBO 2/2 adhesions, chronic constipation who presented to [facility] ED on 1/15 by direction of his PCP for abnormal routine labs.  Patient was noted to have a sodium of 117 and sent to the ED for further evaluation and management.  In the ED, temperature 97.8 F, HR 59, BP 180/78, RR 15, SpO2 100% on 2 L nasal cannula.  WBC 8.4, hemoglobin 11.5, platelets 298.  Sodium 117, potassium 4.1, chloride 83, CO2 26, glucose 118, BUN 14, creatinine 1.30.  AST 27, ALT 14, total bilirubin 0.9.  Urinalysis unrevealing.  Urine sodium 47.  Chest x-ray with no active cardiopulmonary disease process, notable emphysema with scarring versus atelectasis left base.  Patient was given IV fluids, home HCTZ was held.  TRH consulted for admission for further evaluation management of hyponatremia  Assessment & Plan:   Acute on chronic hyponatremia, severe Patient presenting to ED by discretion of his PCP for abnormal labs with a sodium of 117.  Baseline sodium 124.  Etiology likely hypovolemic hyponatremia also complicated by use of HCTZ outpatient.  Urine sodium 47.  TSH and cortisol level within normal limits. -- Na 117>>121>117 -- Increase NS to 125 mL/h -- start sodium chloride 1 g p.o. twice daily -- Continue monitor sodium every 8 hours -- Monitor on telemetry  Left lower extremity edema Vascular 2+ ultrasound negative for DVT.  Essential hypertension -- Amlodipine 5 mg p.o. daily --Atenolol 25 mg p.o. daily -- Irbesartan 3 mg p.o. daily -- Hold  home HCTZ, will discontinue on discharge  Chronic constipation -- Senokot-S1 tablet p.o. nightly -- MiraLAX daily as needed  GERD -- Protonix 40 mg p.o. daily  Physical debility Severe protein calorie malnutrition Hx prostate/esophageal cancer Body mass index is 18.88 kg/m. Patient seen by PT and OT of the recommendation of home health.  Patient follows with palliative care outpatient, plan to transition to hospice on return home.   -- Dietitian consult  -- continue to encourage increased oral intake  DVT prophylaxis: enoxaparin (LOVENOX) injection 40 mg Start: 05/24/22 1000    Code Status: Full Code Family Communication:   Disposition Plan:  Level of care: Telemetry Medical Status is: Inpatient Remains inpatient appropriate because: IV fluids, starting sodium chloride tabs today, needs sodium to improve close to his baseline before stable for discharge home, anticipate possible discharge home tomorrow under hospice    Consultants:  None  Procedures:  Left lower extremity vascular duplex ultrasound  Antimicrobials:  None   Subjective: Patient seen examined bedside, resting comfortably.  Lying in bed.  No complaints this morning.  Denies dizziness, no visual disturbance, no overall weakness or fatigue.  Asking when he can go home.  Discussed with patient that needs his sodium to rise further before stable for discharge.  Anticipate discharge likely tomorrow with anticipated transition to home hospice per previous discussions with palliative care outpatient.  No other specific questions or concerns at this time.  Denies headache, no dizziness, no chest pain, no palpitations, no shortness of breath, no abdominal pain, no focal weakness, no fatigue, no fever/chills/night sweats, no nausea/vomiting/diarrhea,  no paresthesias.  No acute events overnight per nursing staff.  Objective: Vitals:   05/24/22 2126 05/25/22 0048 05/25/22 0523 05/25/22 0827  BP:  125/62 (!) 134/100 133/60   Pulse:  61 (!) 102 61  Resp:  '18 18 18  '$ Temp:  98.4 F (36.9 C) 98.7 F (37.1 C) 98 F (36.7 C)  TempSrc:  Oral Oral Oral  SpO2:  100% 100% 100%  Weight: 66.7 kg     Height: '6\' 2"'$  (1.88 m)       Intake/Output Summary (Last 24 hours) at 05/25/2022 1215 Last data filed at 05/25/2022 0900 Gross per 24 hour  Intake 1985 ml  Output 1150 ml  Net 835 ml   Filed Weights   05/24/22 0815 05/24/22 2126  Weight: 68 kg 66.7 kg    Examination:  Physical Exam: GEN: NAD, alert and oriented x 3, chronically ill/cachectic in appearance; notable fat/muscle wasting/loss noted on physical exam HEENT: NCAT, PERRL, EOMI, sclera clear, MMM PULM: CTAB w/o wheezes/crackles, normal respiratory effort, on room air CV: RRR w/o M/G/R GI: abd soft, NTND, NABS, no R/G/M MSK: no peripheral edema, moves all extremities independently NEURO: CN II-XII intact, no focal deficits, sensation to light touch intact PSYCH: normal mood/affect Integumentary: dry/intact, no rashes or wounds    Data Reviewed: I have personally reviewed following labs and imaging studies  CBC: Recent Labs  Lab 05/23/22 1640 05/24/22 0413  WBC 8.4 7.7  HGB 11.5* 11.8*  HCT 33.8* 33.4*  MCV 96.3 93.3  PLT 298 762   Basic Metabolic Panel: Recent Labs  Lab 05/23/22 1640 05/24/22 0304 05/24/22 0413 05/24/22 0647 05/24/22 1540  NA 117* 117* 118* 121* 117*  K 4.1 4.2 4.1 3.9 4.1  CL 83* 82* 85* 88* 84*  CO2 '26 25 26 26 23  '$ GLUCOSE 118* 117* 99 84 127*  BUN '14 13 13 12 12  '$ CREATININE 1.30* 1.23 1.15 1.08 1.18  CALCIUM 9.0 9.0 8.5* 8.2* 8.5*  MG  --   --  1.5*  --   --   PHOS  --   --  3.6  --   --    GFR: Estimated Creatinine Clearance: 41.6 mL/min (by C-G formula based on SCr of 1.18 mg/dL). Liver Function Tests: Recent Labs  Lab 05/23/22 1640  AST 27  ALT 14  ALKPHOS 106  BILITOT 0.9  PROT 7.3  ALBUMIN 4.1   No results for input(s): "LIPASE", "AMYLASE" in the last 168 hours. No results for input(s):  "AMMONIA" in the last 168 hours. Coagulation Profile: No results for input(s): "INR", "PROTIME" in the last 168 hours. Cardiac Enzymes: No results for input(s): "CKTOTAL", "CKMB", "CKMBINDEX", "TROPONINI" in the last 168 hours. BNP (last 3 results) No results for input(s): "PROBNP" in the last 8760 hours. HbA1C: No results for input(s): "HGBA1C" in the last 72 hours. CBG: No results for input(s): "GLUCAP" in the last 168 hours. Lipid Profile: No results for input(s): "CHOL", "HDL", "LDLCALC", "TRIG", "CHOLHDL", "LDLDIRECT" in the last 72 hours. Thyroid Function Tests: Recent Labs    05/24/22 0413  TSH 1.052   Anemia Panel: No results for input(s): "VITAMINB12", "FOLATE", "FERRITIN", "TIBC", "IRON", "RETICCTPCT" in the last 72 hours. Sepsis Labs: No results for input(s): "PROCALCITON", "LATICACIDVEN" in the last 168 hours.  No results found for this or any previous visit (from the past 240 hour(s)).       Radiology Studies: VAS [place] LOWER EXTREMITY VENOUS (DVT)  Result Date: 05/24/2022  Lower Venous DVT Study Patient  Name:  Oscar [name] [facility]  Date of Exam:   05/24/2022 Medical Rec #: [id number]        Accession #:    [id number] Date of Birth: 08-18-1932        Patient Gender: M Patient Age:   41 years Exam Location:  [facility] Procedure:      VAS [place] LOWER EXTREMITY VENOUS (DVT) Referring Phys: Archie Patten HALL --------------------------------------------------------------------------------  Indications: Edema LT>RT.  Comparison Study: 12-10-2021 Prior bilateral lower extremity venous study was                   negative for DVT. Performing Technologist: Darlin Coco RDMS, RVT  Examination Guidelines: A complete evaluation includes B-mode imaging, spectral Doppler, color Doppler, and power Doppler as needed of all accessible portions of each vessel. Bilateral testing is considered an integral part of a complete examination. Limited examinations for reoccurring indications may be  performed as noted. The reflux portion of the exam is performed with the patient in reverse Trendelenburg.  +-----+---------------+---------+-----------+----------+--------------+ RIGHTCompressibilityPhasicitySpontaneityPropertiesThrombus Aging +-----+---------------+---------+-----------+----------+--------------+ CFV  Full           Yes      Yes                                 +-----+---------------+---------+-----------+----------+--------------+   +---------+---------------+---------+-----------+----------+--------------+ LEFT     CompressibilityPhasicitySpontaneityPropertiesThrombus Aging +---------+---------------+---------+-----------+----------+--------------+ CFV      Full           Yes      Yes                                 +---------+---------------+---------+-----------+----------+--------------+ SFJ      Full                                                        +---------+---------------+---------+-----------+----------+--------------+ FV Prox  Full                                                        +---------+---------------+---------+-----------+----------+--------------+ FV Mid   Full                                                        +---------+---------------+---------+-----------+----------+--------------+ FV DistalFull                                                        +---------+---------------+---------+-----------+----------+--------------+ PFV      Full                                                        +---------+---------------+---------+-----------+----------+--------------+  POP      Full           Yes      Yes                                 +---------+---------------+---------+-----------+----------+--------------+ PTV      Full                                                        +---------+---------------+---------+-----------+----------+--------------+ PERO     Full                                                         +---------+---------------+---------+-----------+----------+--------------+    Summary: RIGHT: - No evidence of common femoral vein obstruction.  LEFT: - There is no evidence of deep vein thrombosis in the lower extremity.  - No cystic structure found in the popliteal fossa.  *See table(s) above for measurements and observations. Electronically signed by Deitra Mayo MD on 05/24/2022 at 10:15:46 AM.    Final    DG Chest 2 View  Result Date: 05/23/2022 CLINICAL DATA:  Shortness of breath EXAM: CHEST - 2 VIEW COMPARISON:  02/16/2022, CT 03/18/2022 FINDINGS: Emphysema. Scarring or atelectasis medial left base. No acute airspace disease, pleural effusion or pneumothorax. Postsurgical changes of the mediastinum. Chronic right seventh rib resection. IMPRESSION: No active cardiopulmonary disease. Emphysema with scarring or atelectasis at the left base. Electronically Signed   By: Donavan Foil M.D.   On: 05/23/2022 17:22        Scheduled Meds:  amLODipine  5 mg Oral Daily   atenolol  25 mg Oral Daily   cyanocobalamin  1,000 mcg Oral Daily   enoxaparin (LOVENOX) injection  40 mg Subcutaneous Daily   irbesartan  300 mg Oral Daily   pantoprazole  40 mg Oral Daily   senna-docusate  1 tablet Oral QHS   sodium chloride  1 g Oral TID WC   Continuous Infusions:  sodium chloride 125 mL/hr at 05/25/22 0859     LOS: 1 day    Time spent: 51 minutes spent on chart review, discussion with nursing staff, consultants, updating family and interview/physical exam; more than 50% of that time was spent in counseling and/or coordination of care.    Alva Broxson J [place] DO [facility] Available via Epic secure chat 7am-7pm After these hours, please refer to coverage provider listed on [email or web address] 05/25/2022, 12:15 PM

## 2022-05-25 NOTE — Progress Notes (Signed)
[facility]) Hospital Liaison Note   Received request from [facility], Marcella Tom-Johnson, for hospice services at home after discharge.    Spoke with Patient's daughter,Yvonee to initiate education related to hospice philosophy, services, and team approach to care. Yvonee verbalized understanding of information given. Per discussion, the plan is for patient to discharge home via private car once cleared to DC.    DME needs discussed. Patient has the following equipment in the home (Purchased privately): Hospital bed Patient requests the following equipment for delivery: None   Address verified and is correct in the chart.    Please send signed and completed DNR home with patient/family. Please provide prescriptions at discharge as needed to ensure ongoing symptom management.    [facility] information and contact numbers given to family & above information shared with TOC.   Please call with any questions/concerns.    Thank you for the opportunity to participate in this patient's care.   Zigmund Gottron  [facility] Liaison  [phone]

## 2022-05-26 DIAGNOSIS — E871 Hypo-osmolality and hyponatremia: Secondary | ICD-10-CM | POA: Diagnosis not present

## 2022-05-26 LAB — BASIC METABOLIC PANEL
Anion gap: 7 (ref 5–15)
BUN: 10 mg/dL (ref 8–23)
CO2: 23 mmol/L (ref 22–32)
Calcium: 8.6 mg/dL — ABNORMAL LOW (ref 8.9–10.3)
Chloride: 91 mmol/L — ABNORMAL LOW (ref 98–111)
Creatinine, Ser: 1.17 mg/dL (ref 0.61–1.24)
GFR, Estimated: >60 mL/min (ref >60)
Glucose, Bld: 78 mg/dL (ref 70–99)
Potassium: 4 mmol/L (ref 3.5–5.1)
Sodium: 121 mmol/L — ABNORMAL LOW (ref 135–145)

## 2022-05-26 MED ORDER — IRBESARTAN 300 MG PO TABS: 300.0000 mg | ORAL_TABLET | Freq: Every day | ORAL | 2 refills | Status: AC

## 2022-05-26 MED ORDER — SODIUM CHLORIDE 1 G PO TABS
1.0000 g | ORAL_TABLET | Freq: Three times a day (TID) | ORAL | Status: DC
  Administered 2022-05-26: 1 g via ORAL
  Filled 2022-05-26: qty 1

## 2022-05-26 MED ORDER — SODIUM CHLORIDE 1 G PO TABS: 1.0000 g | ORAL_TABLET | Freq: Three times a day (TID) | ORAL | 2 refills | Status: AC

## 2022-05-26 MED ORDER — SODIUM CHLORIDE 1 G PO TABS: 1.0000 g | ORAL_TABLET | Freq: Every day | ORAL | Status: DC

## 2022-05-26 NOTE — Discharge Summary (Signed)
Physician Discharge Summary  Oscar [name] FFM:[id number] DOB: [birth year] DOA: 05/23/2022  PCP: Deland Pretty, MD  Admit date: 05/23/2022 Discharge date: 05/26/2022  Admitted From: Home Disposition: Home with hospice  Recommendations for Outpatient Follow-up:  Follow up with PCP in 1-2 weeks Discontinue HCTZ secondary to hyponatremia Started on sodium chloride tabs 1 g p.o. 3 times daily Please obtain BMP in one week  Home Health: No Equipment/Devices: None  Discharge Condition: Stable CODE STATUS: DNR Diet recommendation: Regular diet  History of present illness:  Oscar [name] is a 89 y.o. male with past medical history significant for prostate cancer, chronic hyponatremia (baseline Na 124), HTN, aortic insufficiency, history of esophageal cancer (2001) s/p esophagectomy times 06/1999 and 2015, small tongue residual, Barrett's esophagus, history of anaplastic stricturing, cholelithiasis, GERD, history of SBO 2/2 adhesions, chronic constipation who presented to [facility] ED on 1/15 by direction of his PCP for abnormal routine labs.  Patient was noted to have a sodium of 117 and sent to the ED for further evaluation and management.   In the ED, temperature 97.8 F, HR 59, BP 180/78, RR 15, SpO2 100% on 2 L nasal cannula.  WBC 8.4, hemoglobin 11.5, platelets 298.  Sodium 117, potassium 4.1, chloride 83, CO2 26, glucose 118, BUN 14, creatinine 1.30.  AST 27, ALT 14, total bilirubin 0.9.  Urinalysis unrevealing.  Urine sodium 47.  Chest x-ray with no active cardiopulmonary disease process, notable emphysema with scarring versus atelectasis left base.  Patient was given IV fluids, home HCTZ was held.  TRH consulted for admission for further evaluation management of hyponatremia  Hospital course:  Acute on chronic hyponatremia, severe Patient presenting to ED by discretion of his PCP for abnormal labs with a sodium of 117.  Baseline sodium 124.  Etiology likely hypovolemic hyponatremia also  complicated by use of HCTZ outpatient.  Urine sodium 47.  TSH and cortisol level within normal limits.  Patient was started on IV fluid hydration and sodium chloride tablets.  Sodium increased up to 121 at time of discharge.  Patient encouraged to continue to increase his water intake.  Discharged on sodium chloride 1 g p.o. 3 times daily.  Recommend repeat BMP 1 week; although now discharging on home hospice.   Left lower extremity edema Vascular duplex ultrasound negative for DVT.   Essential hypertension Hydrochlorothiazide discontinued due to hyponatremia.  Continue amlodipine 5 mg p.o. daily, Atenolol 25 mg p.o. daily, Irbesartan 300 mg p.o. daily   GERD Continue Nexium   Physical debility Severe protein calorie malnutrition Hx prostate/esophageal cancer Body mass index is 18.88 kg/m. Patient seen by PT and OT of the recommendation of home health.  Patient follows with palliative care outpatient, plan to transition to hospice on return home. Continue to encourage increased oral intake  Discharge Diagnoses:  Principal Problem:   Hyponatremia    Discharge Instructions  Discharge Instructions     Call MD for:  extreme fatigue   Complete by: As directed    Call MD for:  persistant dizziness or light-headedness   Complete by: As directed    Call MD for:  persistant nausea and vomiting   Complete by: As directed    Call MD for:  severe uncontrolled pain   Complete by: As directed    Call MD for:  temperature >100.4   Complete by: As directed    Diet - low sodium heart healthy   Complete by: As directed    Increase activity slowly   Complete  by: As directed       Allergies as of 05/26/2022       Reactions   Bee Venom Anaphylaxis   Flomax [tamsulosin] Other (See Comments)   "Makes me feel weird"   Tadalafil    Other reaction(s): Nasal congestion        Medication List     STOP taking these medications    losartan-hydrochlorothiazide 100-12.5 MG  tablet Commonly known as: HYZAAR   olmesartan 40 MG tablet Commonly known as: BENICAR Replaced by: irbesartan 300 MG tablet       TAKE these medications    ALIVE MULTI-VITAMIN PO Take 1 tablet by mouth daily.   amLODipine 5 MG tablet Commonly known as: NORVASC Take 5 mg by mouth daily.   atenolol 25 MG tablet Commonly known as: TENORMIN Take 25 mg by mouth daily.   irbesartan 300 MG tablet Commonly known as: AVAPRO Take 1 tablet (300 mg total) by mouth daily. Replaces: olmesartan 40 MG tablet   omeprazole 20 MG capsule Commonly known as: PRILOSEC Take 40 mg by mouth daily.   silodosin 4 MG Caps capsule Commonly known as: RAPAFLO Take 4 mg by mouth daily.   sodium chloride 1 g tablet Take 1 tablet (1 g total) by mouth 3 (three) times daily with meals.        Follow-up Information     Deland Pretty, MD. Schedule an appointment as soon as possible for a visit in 1 week(s).   Specialty: Internal Medicine Contact information: [address] [place] [address] [phone]                Allergies  Allergen Reactions   Bee Venom Anaphylaxis   Flomax [Tamsulosin] Other (See Comments)    "Makes me feel weird"   Tadalafil     Other reaction(s): Nasal congestion    Consultations: None   Procedures/Studies: VAS [place] LOWER EXTREMITY VENOUS (DVT)  Result Date: 05/24/2022  Lower Venous DVT Study Patient Name:  Oscar [name] [place]  Date of Exam:   05/24/2022 Medical Rec #: [id number]        Accession #:    [id number] Date of Birth: 1932/09/21        Patient Gender: M Patient Age:   89 years Exam Location:  [facility] Procedure:      VAS [place] LOWER EXTREMITY VENOUS (DVT) Referring Phys: Archie Patten HALL --------------------------------------------------------------------------------  Indications: Edema LT>RT.  Comparison Study: 12-10-2021 Prior bilateral lower extremity venous study was                   negative for DVT. Performing Technologist:  Darlin Coco RDMS, RVT  Examination Guidelines: A complete evaluation includes B-mode imaging, spectral Doppler, color Doppler, and power Doppler as needed of all accessible portions of each vessel. Bilateral testing is considered an integral part of a complete examination. Limited examinations for reoccurring indications may be performed as noted. The reflux portion of the exam is performed with the patient in reverse Trendelenburg.  +-----+---------------+---------+-----------+----------+--------------+ RIGHTCompressibilityPhasicitySpontaneityPropertiesThrombus Aging +-----+---------------+---------+-----------+----------+--------------+ CFV  Full           Yes      Yes                                 +-----+---------------+---------+-----------+----------+--------------+   +---------+---------------+---------+-----------+----------+--------------+ LEFT     CompressibilityPhasicitySpontaneityPropertiesThrombus Aging +---------+---------------+---------+-----------+----------+--------------+ CFV      Full  Yes      Yes                                 +---------+---------------+---------+-----------+----------+--------------+ SFJ      Full                                                        +---------+---------------+---------+-----------+----------+--------------+ FV Prox  Full                                                        +---------+---------------+---------+-----------+----------+--------------+ FV Mid   Full                                                        +---------+---------------+---------+-----------+----------+--------------+ FV DistalFull                                                        +---------+---------------+---------+-----------+----------+--------------+ PFV      Full                                                        +---------+---------------+---------+-----------+----------+--------------+ POP       Full           Yes      Yes                                 +---------+---------------+---------+-----------+----------+--------------+ PTV      Full                                                        +---------+---------------+---------+-----------+----------+--------------+ PERO     Full                                                        +---------+---------------+---------+-----------+----------+--------------+    Summary: RIGHT: - No evidence of common femoral vein obstruction.  LEFT: - There is no evidence of deep vein thrombosis in the lower extremity.  - No cystic structure found in the popliteal fossa.  *See table(s) above for measurements and observations. Electronically signed by Deitra Mayo MD on 05/24/2022 at 10:15:46 AM.    Final    DG Chest 2 View  Result Date: 05/23/2022  CLINICAL DATA:  Shortness of breath EXAM: CHEST - 2 VIEW COMPARISON:  02/16/2022, CT 03/18/2022 FINDINGS: Emphysema. Scarring or atelectasis medial left base. No acute airspace disease, pleural effusion or pneumothorax. Postsurgical changes of the mediastinum. Chronic right seventh rib resection. IMPRESSION: No active cardiopulmonary disease. Emphysema with scarring or atelectasis at the left base. Electronically Signed   By: Donavan Foil M.D.   On: 05/23/2022 17:22   DG INJECT DIAG/THERA/INC NEEDLE/CATH/PLC EPI/LUMB/SAC W/IMG  Result Date: 05/10/2022 CLINICAL DATA:  Lumbosacral spondylosis without myelopathy. Low back pain. Positive response to previous epidural injections received elsewhere. Left L5-S1 epidural injection requested. FLUOROSCOPY: Radiation Exposure Index (as provided by the fluoroscopic device): 1.10 mGy Kerma PROCEDURE: The procedure, risks, benefits, and alternatives were explained to the patient. Questions regarding the procedure were encouraged and answered. The patient understands and consents to the procedure. LUMBAR EPIDURAL INJECTION: An interlaminar approach was  performed on the left at L5-S1. The overlying skin was cleansed and anesthetized. A 3.5 inch 20 gauge epidural needle was advanced using loss-of-resistance technique. DIAGNOSTIC EPIDURAL INJECTION: Injection of Isovue-M 200 shows a good epidural pattern with spread above and below the level of needle placement, primarily on the left. No vascular opacification is seen. THERAPEUTIC EPIDURAL INJECTION: 80 mg of Depo-Medrol mixed with 2 mL of 1% lidocaine were instilled. The procedure was well-tolerated, and the patient was discharged thirty minutes following the injection in good condition. COMPLICATIONS: None immediate IMPRESSION: Technically successful interlaminar epidural injection on the left at L5-S1. Electronically Signed   By: Logan Bores M.D.   On: 05/10/2022 12:12     Subjective: Patient seen examined bedside, resting calmly.  Sitting at edge of bed.  Irritated about the bed alarm.  States he is ready for discharge home today.  No other questions or concerns at this time.  Discussed with patient that he needs to increase his water intake given his dehydration.  Also discussed to no longer use hydrochlorothiazide is likely contributing factor to his hyponatremia.  No other specific questions or concerns at this time.  Denies headache, no dizziness, no chest pain, no palpitations, no visual changes, no fever/chills/night sweats, no nausea/vomiting/diarrhea, no abdominal pain, no focal weakness, no cough/congestion, no fatigue, no paresthesias.  No acute events overnight per nursing staff.  Discharge Exam: Vitals:   05/26/22 0430 05/26/22 0926  BP: 131/61 (!) 138/54  Pulse: (!) 59 (!) 52  Resp: 19 17  Temp: 98.2 F (36.8 C) 98.2 F (36.8 C)  SpO2: 100% 100%   Vitals:   05/25/22 1734 05/25/22 2108 05/26/22 0430 05/26/22 0926  BP: (!) 178/73 (!) 154/65 131/61 (!) 138/54  Pulse: 61 60 (!) 59 (!) 52  Resp: '18 16 19 17  '$ Temp: 98.2 F (36.8 C) 98.6 F (37 C) 98.2 F (36.8 C) 98.2 F (36.8 C)   TempSrc: Oral Oral Oral   SpO2: 100% 100% 100% 100%  Weight:      Height:        Physical Exam: GEN: NAD, alert and oriented x 3, thin/cachectic in appearance HEENT: NCAT, PERRL, EOMI, sclera clear, MMM PULM: CTAB w/o wheezes/crackles, normal respiratory effort, on room air CV: RRR w/o M/G/R GI: abd soft, NTND, NABS, no R/G/M MSK: no peripheral edema, moves all extremities independently with preserved strength NEURO: CN II-XII intact, no focal deficits, sensation to light touch intact PSYCH: normal mood/affect Integumentary: dry/intact, no rashes or wounds    The results of significant diagnostics from this hospitalization (including imaging, microbiology, ancillary and laboratory)  are listed below for reference.     Microbiology: No results found for this or any previous visit (from the past 240 hour(s)).   Labs: BNP (last 3 results) No results for input(s): "BNP" in the last 8760 hours. Basic Metabolic Panel: Recent Labs  Lab 05/24/22 0304 05/24/22 0413 05/24/22 0647 05/24/22 1540 05/25/22 1230 05/25/22 1923 05/26/22 0646  NA 117* 118* 121* 117* 120* 137 121*  K 4.2 4.1 3.9 4.1  --   --  4.0  CL 82* 85* 88* 84*  --   --  91*  CO2 '25 26 26 23  '$ --   --  23  GLUCOSE 117* 99 84 127*  --   --  78  BUN '13 13 12 12  '$ --   --  10  CREATININE 1.23 1.15 1.08 1.18  --   --  1.17  CALCIUM 9.0 8.5* 8.2* 8.5*  --   --  8.6*  MG  --  1.5*  --   --   --   --   --   PHOS  --  3.6  --   --   --   --   --    Liver Function Tests: Recent Labs  Lab 05/23/22 1640  AST 27  ALT 14  ALKPHOS 106  BILITOT 0.9  PROT 7.3  ALBUMIN 4.1   No results for input(s): "LIPASE", "AMYLASE" in the last 168 hours. No results for input(s): "AMMONIA" in the last 168 hours. CBC: Recent Labs  Lab 05/23/22 1640 05/24/22 0413  WBC 8.4 7.7  HGB 11.5* 11.8*  HCT 33.8* 33.4*  MCV 96.3 93.3  PLT 298 278   Cardiac Enzymes: No results for input(s): "CKTOTAL", "CKMB", "CKMBINDEX", "TROPONINI"  in the last 168 hours. BNP: Invalid input(s): "POCBNP" CBG: No results for input(s): "GLUCAP" in the last 168 hours. D-Dimer No results for input(s): "DDIMER" in the last 72 hours. Hgb A1c No results for input(s): "HGBA1C" in the last 72 hours. Lipid Profile No results for input(s): "CHOL", "HDL", "LDLCALC", "TRIG", "CHOLHDL", "LDLDIRECT" in the last 72 hours. Thyroid function studies Recent Labs    05/24/22 0413  TSH 1.052   Anemia work up No results for input(s): "VITAMINB12", "FOLATE", "FERRITIN", "TIBC", "IRON", "RETICCTPCT" in the last 72 hours. Urinalysis    Component Value Date/Time   COLORURINE YELLOW 05/23/2022 1640   APPEARANCEUR HAZY (A) 05/23/2022 1640   LABSPEC 1.008 05/23/2022 1640   PHURINE 7.0 05/23/2022 1640   GLUCOSEU NEGATIVE 05/23/2022 1640   HGBUR SMALL (A) 05/23/2022 1640   BILIRUBINUR NEGATIVE 05/23/2022 1640   KETONESUR NEGATIVE 05/23/2022 1640   PROTEINUR NEGATIVE 05/23/2022 1640   UROBILINOGEN 0.2 09/12/2014 1816   NITRITE NEGATIVE 05/23/2022 1640   LEUKOCYTESUR NEGATIVE 05/23/2022 1640   Sepsis Labs Recent Labs  Lab 05/23/22 1640 05/24/22 0413  WBC 8.4 7.7   Microbiology No results found for this or any previous visit (from the past 240 hour(s)).   Time coordinating discharge: Over 30 minutes  SIGNED:   Jacksen Isip J [place] DO  [facility] 05/26/2022, 9:40 AM

## 2022-05-26 NOTE — Plan of Care (Signed)
Patient is being discharged today. PIV removed. Personal belongings were sent with patient. Patient expressed concerns about blood pressure medication. Patient education provided regarding changes made on AVS. Patient verbalized understanding. Patient is being transported home in [place] by son.

## 2022-05-26 NOTE — Discharge Instructions (Signed)
Continue to encourage increased water intake.

## 2022-05-26 NOTE — TOC Transition Note (Signed)
Transition of Care [facility]) - CM/SW Discharge Note   Patient Details  Name: Oscar [name] MRN: [id number] Date of Birth: 03/21/1933  [facility] of [facility] CM/SW Contact:  Tom-Johnson, Renea Ee, RN Phone Number: 05/26/2022, 11:17 AM   Clinical Narrative:     Patient is scheduled for discharge home with Hospice today. Sarah with [facility] notified  as well as patient's daughter, [name]. Patient's son to transport at discharge. No further TOC needs noted.      Final next level of care: Home w Hospice Care Barriers to Discharge: Barriers Resolved   Patient Goals and CMS Choice CMS Medicare.gov Compare Post Acute Care list provided to:: Patient Choice offered to / list presented to : Patient, Adult Children (Daughter, [name])  Discharge Placement                  Patient to be transferred to facility by: Son      Discharge Plan and Services Additional resources added to the After Visit Summary for   In-house Referral: Hospice / Palliative Care Discharge Planning Services: CM Consult Post Acute Care Choice: Hospice          DME Arranged: N/A DME Agency: NA       HH Arranged: NA (Recommended but patient going home with Hospice.) [place] Agency: NA        Social Determinants of Health (SDOH) Interventions SDOH Screenings   Food Insecurity: No Food Insecurity (05/25/2022)  Housing: Low Risk  (05/25/2022)  Transportation Needs: No Transportation Needs (05/25/2022)  Utilities: Not At Risk (05/25/2022)  Tobacco Use: High Risk (05/25/2022)     Readmission Risk Interventions     No data to display

## 2022-06-22 DIAGNOSIS — E871 Hypo-osmolality and hyponatremia: Secondary | ICD-10-CM | POA: Diagnosis not present

## 2022-06-22 DIAGNOSIS — R600 Localized edema: Secondary | ICD-10-CM | POA: Diagnosis not present

## 2022-06-28 DIAGNOSIS — E871 Hypo-osmolality and hyponatremia: Secondary | ICD-10-CM | POA: Diagnosis not present

## 2022-07-04 DIAGNOSIS — E871 Hypo-osmolality and hyponatremia: Secondary | ICD-10-CM | POA: Diagnosis not present

## 2022-08-19 DIAGNOSIS — M6281 Muscle weakness (generalized): Secondary | ICD-10-CM | POA: Diagnosis not present

## 2022-08-19 DIAGNOSIS — R031 Nonspecific low blood-pressure reading: Secondary | ICD-10-CM | POA: Diagnosis not present
